# Patient Record
Sex: Male | Born: 1970 | Race: White | Hispanic: No | Marital: Single | Smoking: Current every day smoker
Health system: Southern US, Community
[De-identification: ages and names within clinical notes are randomized; demographics above are authoritative.]

## PROBLEM LIST (undated history)

## (undated) DIAGNOSIS — F209 Schizophrenia, unspecified: Secondary | ICD-10-CM

## (undated) DIAGNOSIS — E119 Type 2 diabetes mellitus without complications: Secondary | ICD-10-CM

## (undated) DIAGNOSIS — Z978 Presence of other specified devices: Secondary | ICD-10-CM

## (undated) DIAGNOSIS — J45909 Unspecified asthma, uncomplicated: Secondary | ICD-10-CM

## (undated) DIAGNOSIS — F32A Depression, unspecified: Secondary | ICD-10-CM

## (undated) HISTORY — PX: HERNIA REPAIR: SHX51

---

## 2011-08-27 ENCOUNTER — Emergency Department (INDEPENDENT_AMBULATORY_CARE_PROVIDER_SITE_OTHER): Payer: Self-pay

## 2011-08-27 ENCOUNTER — Encounter: Payer: Self-pay | Admitting: *Deleted

## 2011-08-27 ENCOUNTER — Emergency Department (HOSPITAL_BASED_OUTPATIENT_CLINIC_OR_DEPARTMENT_OTHER)
Admission: EM | Admit: 2011-08-27 | Discharge: 2011-08-27 | Disposition: A | Discharge status: Home / Self Care | Payer: Self-pay | Attending: Emergency Medicine | Admitting: Emergency Medicine

## 2011-08-27 ENCOUNTER — Other Ambulatory Visit: Payer: Self-pay

## 2011-08-27 DIAGNOSIS — R0609 Other forms of dyspnea: Secondary | ICD-10-CM

## 2011-08-27 DIAGNOSIS — R942 Abnormal results of pulmonary function studies: Secondary | ICD-10-CM

## 2011-08-27 DIAGNOSIS — R0602 Shortness of breath: Secondary | ICD-10-CM | POA: Insufficient documentation

## 2011-08-27 DIAGNOSIS — J40 Bronchitis, not specified as acute or chronic: Secondary | ICD-10-CM | POA: Insufficient documentation

## 2011-08-27 DIAGNOSIS — F17200 Nicotine dependence, unspecified, uncomplicated: Secondary | ICD-10-CM

## 2011-08-27 DIAGNOSIS — R05 Cough: Secondary | ICD-10-CM

## 2011-08-27 HISTORY — DX: Schizophrenia, unspecified: F20.9

## 2011-08-27 MED ORDER — ALBUTEROL SULFATE HFA 108 (90 BASE) MCG/ACT IN AERS
2.0000 | INHALATION_SPRAY | RESPIRATORY_TRACT | Status: DC | PRN
  Administered 2011-08-27: 2 via RESPIRATORY_TRACT
  Filled 2011-08-27: qty 6.7

## 2011-08-27 NOTE — ED Notes (Signed)
Pt is visibly anxious during exam. Restless with extremities. States he has sensation of "not getting in enough air" while these attacks occur. States it has been occuring after smoking cigarettes last night and again today. Denies hx of anxiety. Denies change in meds, but states he was out of his prolixin for a few days, and restarted it yesterday, which is not uncommon for him to run out and be off for a few days per pt. Denies any recent stressors or cause of anxiety. Denies SI/HI. Pt is cooperative during exam.

## 2011-08-27 NOTE — ED Notes (Signed)
After smoking a cigarette last night got sob. Was better when he woke this am. Same thing after smoking 6 cigarettes today. Mom thinks he is having panic attacks. Ran out of Prolixin a week ago.

## 2011-08-27 NOTE — ED Provider Notes (Signed)
History     CSN: [id number] Arrival date & time: 08/27/2011  8:38 PM  Chief Complaint  Patient presents with  . URI    (Consider location/radiation/quality/duration/timing/severity/associated sxs/prior treatment) Patient is a 40 y.o. male presenting with shortness of breath. The history is provided by the patient.  Shortness of Breath  The current episode started yesterday. Associated symptoms include shortness of breath. Pertinent negatives include no chest pain, no fever, no rhinorrhea, no sore throat and no wheezing. There was no intake of a foreign body. He was not exposed to toxic fumes.  patient has had 2 episodes of shortness of breath. Both while smoking. He then began to feel anxious about not breathing. No cough. No chest pain. No palpitations. No recent travel. No leg swelling. He is a smoker.   Past Medical History  Diagnosis Date  . Schizophrenia     History reviewed. No pertinent past surgical history.  No family history on file.  History  Substance Use Topics  . Smoking status: Current Everyday Smoker -- 1.0 packs/day  . Smokeless tobacco: Not on file  . Alcohol Use: No      Review of Systems  Constitutional: Negative for fever and chills.  HENT: Negative for sore throat and rhinorrhea.   Respiratory: Positive for shortness of breath. Negative for wheezing.   Cardiovascular: Negative for chest pain.  Psychiatric/Behavioral: The patient is not nervous/anxious.     Allergies  Review of patient's allergies indicates no known allergies.  Home Medications   Current Outpatient Rx  Name Route Sig Dispense Refill  . BENZTROPINE MESYLATE 1 MG PO TABS Oral Take 1 mg by mouth daily.      Marland Kitchen CALCIUM CARBONATE ANTACID 500 MG PO CHEW Oral Chew 1 tablet by mouth 2 (two) times daily as needed. For upset stomach      . FLUOXETINE HCL 40 MG PO CAPS Oral Take 40 mg by mouth daily.      Marland Kitchen FLUPHENAZINE HCL 10 MG PO TABS Oral Take 10 mg by mouth daily.        BP 129/86   Pulse 90  Temp(Src) 98.1 F (36.7 C) (Oral)  Resp 18  SpO2 100%  Physical Exam  Nursing note and vitals reviewed. Constitutional: He is oriented to person, place, and time. He appears well-developed and well-nourished.  HENT:  Head: Normocephalic and atraumatic.  Eyes: EOM are normal. Pupils are equal, round, and reactive to light.  Neck: Normal range of motion. Neck supple.  Cardiovascular: Normal rate, regular rhythm and normal heart sounds.   No murmur heard. Pulmonary/Chest: Effort normal and breath sounds normal.  Abdominal: Soft. Bowel sounds are normal. He exhibits no distension and no mass. There is no tenderness. There is no rebound and no guarding.  Musculoskeletal: Normal range of motion. He exhibits no edema.  Neurological: He is alert and oriented to person, place, and time. No cranial nerve deficit.  Skin: Skin is warm and dry.  Psychiatric: He has a normal mood and affect.    ED Course  Procedures (including critical care time)  Labs Reviewed - No data to display Dg Chest 2 View  08/27/2011  *RADIOLOGY REPORT*  Clinical Data: Smoker with cough and labored breathing.  CHEST - 2 VIEW 08/27/2011:  Comparison: None.  Findings: Cardiomediastinal silhouette unremarkable.  Mildly prominent bronchovascular markings diffusely and mild central peribronchial thickening.  Lungs otherwise clear.  No pleural effusions.  Visualized bony thorax intact.  IMPRESSION: Mild changes of bronchitis and/or asthma.  No  acute cardiopulmonary disease otherwise.  Original Report Authenticated By: Arnell Sieving, M.D.     1. Bronchitis     Date: 08/27/2011  Rate: 97  Rhythm: normal sinus rhythm  QRS Axis: right  Intervals: normal  ST/T Wave abnormalities: normal  Conduction Disutrbances:none  Narrative Interpretation:   Old EKG Reviewed: none available      MDM  Shortness of breath. Acute onset. Improved. Xray showed bronchitis. EKG reassuring. Discharged with inhaler.          Juliet Rude. Rubin Payor, MD 08/27/11 2216

## 2011-08-27 NOTE — ED Notes (Signed)
Pt given albuterol inh TO GO and instructed on usage. Pt demonstrated correct technique

## 2012-12-31 IMAGING — CR DG CHEST 2V
2 series · 2 of 2 positions shown · non-contrast
Comparison: None.

CLINICAL DATA: Smoker with cough and labored breathing.

CHEST - 2 VIEW 08/27/2011:

[w chest pa]
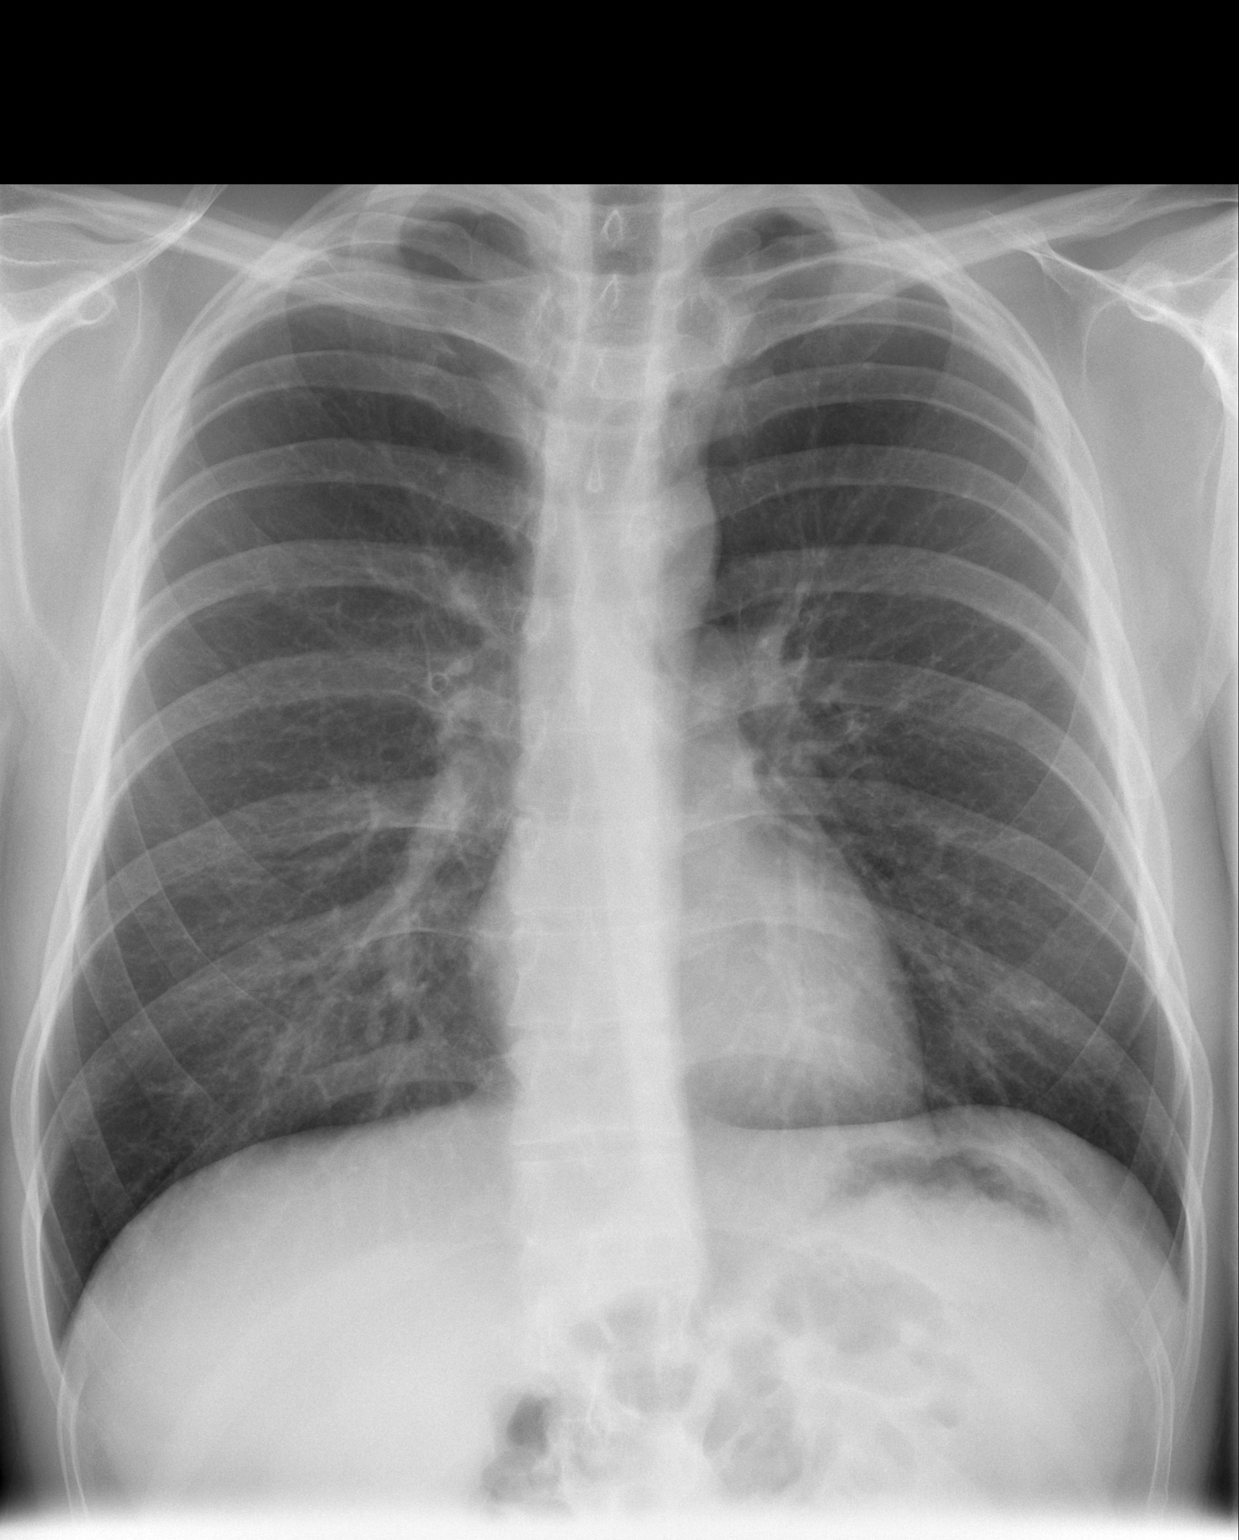

[w chest lat]
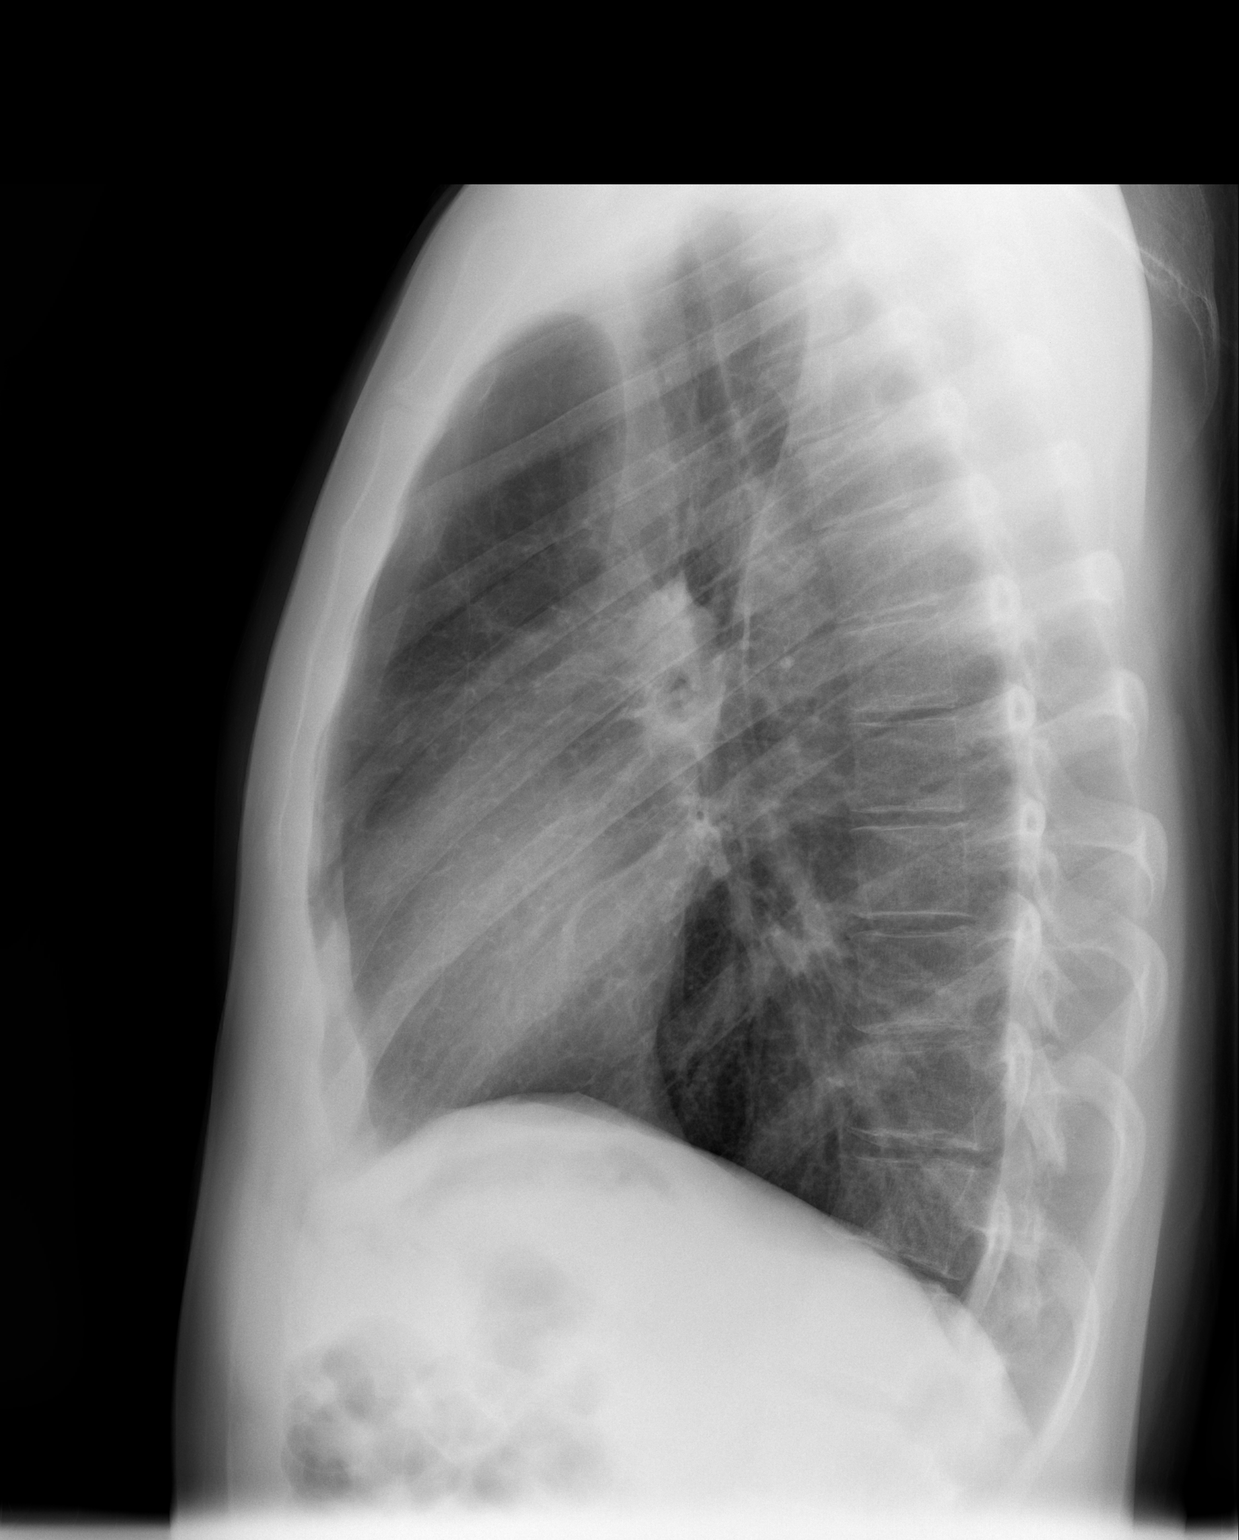

[2 of 2 positions shown; findings below may reference images not displayed]

FINDINGS: Cardiomediastinal silhouette unremarkable.  Mildly
prominent bronchovascular markings diffusely and mild central
peribronchial thickening.  Lungs otherwise clear.  No pleural
effusions.  Visualized bony thorax intact.
IMPRESSION: Mild changes of bronchitis and/or asthma.  No acute cardiopulmonary
disease otherwise.

## 2015-02-04 ENCOUNTER — Emergency Department
Admission: EM | Admit: 2015-02-04 | Discharge: 2015-02-04 | Disposition: A | Discharge status: Home / Self Care | Payer: Medicaid Other | Source: Home / Self Care | Attending: Family Medicine | Admitting: Family Medicine

## 2015-02-04 ENCOUNTER — Encounter: Payer: Self-pay | Admitting: Emergency Medicine

## 2015-02-04 DIAGNOSIS — S238XXA Sprain of other specified parts of thorax, initial encounter: Secondary | ICD-10-CM

## 2015-02-04 MED ORDER — MELOXICAM 15 MG PO TABS: ORAL_TABLET | ORAL | Status: DC

## 2015-02-04 MED ORDER — METAXALONE 800 MG PO TABS: 800.0000 mg | ORAL_TABLET | Freq: Three times a day (TID) | ORAL | Status: DC

## 2015-02-04 NOTE — ED Notes (Signed)
Rt shoulder pain, woke up 3 days ago with pain in shoulder, upper back, right neck, denies trauma.

## 2015-02-04 NOTE — Discharge Instructions (Signed)
Apply ice pack for 20 to 30 minutes, 3 to 4 times daily  Continue until pain decreases.  Begin range of motion and stretching exercises.   Cryotherapy Cryotherapy means treatment with cold. Ice or gel packs can be used to reduce both pain and swelling. Ice is the most helpful within the first 24 to 48 hours after an injury or flare-up from overusing a muscle or joint. Sprains, strains, spasms, burning pain, shooting pain, and aches can all be eased with ice. Ice can also be used when recovering from surgery. Ice is effective, has very few side effects, and is safe for most people to use. PRECAUTIONS  Ice is not a safe treatment option for people with:  Raynaud phenomenon. This is a condition affecting small blood vessels in the extremities. Exposure to cold may cause your problems to return.  Cold hypersensitivity. There are many forms of cold hypersensitivity, including:  Cold urticaria. Red, itchy hives appear on the skin when the tissues begin to warm after being iced.  Cold erythema. This is a red, itchy rash caused by exposure to cold.  Cold hemoglobinuria. Red blood cells break down when the tissues begin to warm after being iced. The hemoglobin that carry oxygen are passed into the urine because they cannot combine with blood proteins fast enough.  Numbness or altered sensitivity in the area being iced. If you have any of the following conditions, do not use ice until you have discussed cryotherapy with your caregiver:  Heart conditions, such as arrhythmia, angina, or chronic heart disease.  High blood pressure.  Healing wounds or open skin in the area being iced.  Current infections.  Rheumatoid arthritis.  Poor circulation.  Diabetes. Ice slows the blood flow in the region it is applied. This is beneficial when trying to stop inflamed tissues from spreading irritating chemicals to surrounding tissues. However, if you expose your skin to cold temperatures for too long or  without the proper protection, you can damage your skin or nerves. Watch for signs of skin damage due to cold. HOME CARE INSTRUCTIONS Follow these tips to use ice and cold packs safely.  Place a dry or damp towel between the ice and skin. A damp towel will cool the skin more quickly, so you may need to shorten the time that the ice is used.  For a more rapid response, add gentle compression to the ice.  Ice for no more than 10 to 20 minutes at a time. The bonier the area you are icing, the less time it will take to get the benefits of ice.  Check your skin after 5 minutes to make sure there are no signs of a poor response to cold or skin damage.  Rest 20 minutes or more between uses.  Once your skin is numb, you can end your treatment. You can test numbness by very lightly touching your skin. The touch should be so light that you do not see the skin dimple from the pressure of your fingertip. When using ice, most people will feel these normal sensations in this order: cold, burning, aching, and numbness.  Do not use ice on someone who cannot communicate their responses to pain, such as small children or people with dementia. HOW TO MAKE AN ICE PACK Ice packs are the most common way to use ice therapy. Other methods include ice massage, ice baths, and cryosprays. Muscle creams that cause a cold, tingly feeling do not offer the same benefits that ice offers and  should not be used as a substitute unless recommended by your caregiver. To make an ice pack, do one of the following:  Place crushed ice or a bag of frozen vegetables in a sealable plastic bag. Squeeze out the excess air. Place this bag inside another plastic bag. Slide the bag into a pillowcase or place a damp towel between your skin and the bag.  Mix 3 parts water with 1 part rubbing alcohol. Freeze the mixture in a sealable plastic bag. When you remove the mixture from the freezer, it will be slushy. Squeeze out the excess air. Place  this bag inside another plastic bag. Slide the bag into a pillowcase or place a damp towel between your skin and the bag. SEEK MEDICAL CARE IF:  You develop white spots on your skin. This may give the skin a blotchy (mottled) appearance.  Your skin turns blue or pale.  Your skin becomes waxy or hard.  Your swelling gets worse. MAKE SURE YOU:   Understand these instructions.  Will watch your condition.  Will get help right away if you are not doing well or get worse. Document Released: 07/13/2011 Document Revised: 04/02/2014 Document Reviewed: 07/13/2011 [facility] Patient Information 2015 [place], [place]. This information is not intended to replace advice given to you by your health care provider. Make sure you discuss any questions you have with your health care provider.

## 2015-02-04 NOTE — ED Provider Notes (Signed)
CSN: [id number]     Arrival date & time 02/04/15  9357 History   First MD Initiated Contact with Patient 02/04/15 1008     Chief Complaint  Patient presents with  . Shoulder Pain      HPI Comments: Patient slept on his right arm 3 days ago and awoke with pain in his right shoulder blade area that radiates to his right shoulder.  He recalls no injury otherwise.  The pain has persisted and is worse with movement of his shoulder.  No cough or pleuritic pain.  He feels well otherwise.  Patient is a 44 y.o. male presenting with shoulder pain. The history is provided by the patient.  Shoulder Pain Location:  Shoulder Time since incident:  3 days Injury: no   Shoulder location:  R shoulder Pain details:    Quality:  Aching   Severity:  Mild   Onset quality:  Sudden   Duration:  3 days   Timing:  Constant   Progression:  Unchanged Chronicity:  New Handedness:  Right-handed Prior injury to area:  No Relieved by:  Nothing Worsened by:  Movement Ineffective treatments:  NSAIDs Associated symptoms: no back pain, no decreased range of motion, no fatigue, no fever, no muscle weakness, no neck pain, no numbness, no stiffness, no swelling and no tingling     Past Medical History  Diagnosis Date  . Schizophrenia    History reviewed. No pertinent past surgical history. No family history on file. History  Substance Use Topics  . Smoking status: Current Every Day Smoker -- 1.00 packs/day for 25 years  . Smokeless tobacco: Not on file  . Alcohol Use: No    Review of Systems  Constitutional: Negative for fever and fatigue.  Musculoskeletal: Negative for back pain, stiffness and neck pain.  All other systems reviewed and are negative.   Allergies  Review of patient's allergies indicates not on file.  Home Medications   Prior to Admission medications   Medication Sig Start Date End Date Taking? Authorizing Provider  benztropine (COGENTIN) 1 MG tablet Take 1 mg by mouth daily.       Historical Provider, MD  calcium carbonate (TUMS - DOSED IN MG ELEMENTAL CALCIUM) 500 MG chewable tablet Chew 1 tablet by mouth 2 (two) times daily as needed. For upset stomach      Historical Provider, MD  FLUoxetine (PROZAC) 40 MG capsule Take 40 mg by mouth daily.      Historical Provider, MD  fluPHENAZine (PROLIXIN) 10 MG tablet Take 10 mg by mouth daily.      Historical Provider, MD  meloxicam (MOBIC) 15 MG tablet Take with food each evening 02/04/15   Kandra Nicolas, MD  metaxalone (SKELAXIN) 800 MG tablet Take 1 tablet (800 mg total) by mouth 3 (three) times daily. 02/04/15   Kandra Nicolas, MD   BP 112/78 mmHg  Pulse 71  Temp(Src) 98.6 F (37 C) (Oral)  Ht 5' 9"  (1.753 m)  Wt 163 lb (73.936 kg)  BMI 24.06 kg/m2  SpO2 100% Physical Exam  Constitutional: He is oriented to person, place, and time. He appears well-developed and well-nourished. No distress.  HENT:  Head: Normocephalic.  Eyes: Pupils are equal, round, and reactive to light.  Neck: Normal range of motion.  Cardiovascular: Normal heart sounds.   Pulmonary/Chest: Breath sounds normal.    There is distinct tenderness over medial and inferior edges of right scapula.  Pain elicited by resisted abduction of right shoulder while palpating  right rhomboid muscles.   Right shoulder has full range of motion without tenderness to palpation.  Neurological: He is alert and oriented to person, place, and time.  Skin: Skin is warm and dry. No rash noted.  Nursing note and vitals reviewed.   ED Course  Procedures  none     MDM   1. Sprain of rhomboid, initial encounter    Begin Mobic 85m once daily and Skelaxin 8037mTID Apply ice pack for 20 to 30 minutes, 3 to 4 times daily  Continue until pain decreases.  Begin range of motion and stretching exercises. [facility] about two weeks.     StKandra NicolasMD 02/04/15 1041

## 2015-12-22 DIAGNOSIS — R5382 Chronic fatigue, unspecified: Secondary | ICD-10-CM | POA: Insufficient documentation

## 2015-12-22 DIAGNOSIS — F329 Major depressive disorder, single episode, unspecified: Secondary | ICD-10-CM | POA: Diagnosis present

## 2015-12-22 DIAGNOSIS — G2401 Drug induced subacute dyskinesia: Secondary | ICD-10-CM | POA: Diagnosis present

## 2015-12-22 DIAGNOSIS — F17210 Nicotine dependence, cigarettes, uncomplicated: Secondary | ICD-10-CM | POA: Diagnosis present

## 2015-12-22 DIAGNOSIS — G250 Essential tremor: Secondary | ICD-10-CM | POA: Insufficient documentation

## 2015-12-24 DIAGNOSIS — R1013 Epigastric pain: Secondary | ICD-10-CM | POA: Insufficient documentation

## 2016-10-14 DIAGNOSIS — R45851 Suicidal ideations: Secondary | ICD-10-CM | POA: Insufficient documentation

## 2016-10-14 DIAGNOSIS — F1010 Alcohol abuse, uncomplicated: Secondary | ICD-10-CM | POA: Diagnosis present

## 2016-10-14 DIAGNOSIS — F1210 Cannabis abuse, uncomplicated: Secondary | ICD-10-CM | POA: Diagnosis present

## 2016-10-14 DIAGNOSIS — F1910 Other psychoactive substance abuse, uncomplicated: Secondary | ICD-10-CM | POA: Diagnosis present

## 2016-10-14 DIAGNOSIS — F1220 Cannabis dependence, uncomplicated: Secondary | ICD-10-CM | POA: Diagnosis present

## 2020-10-03 ENCOUNTER — Emergency Department (HOSPITAL_BASED_OUTPATIENT_CLINIC_OR_DEPARTMENT_OTHER): Payer: Medicaid Other

## 2020-10-03 ENCOUNTER — Other Ambulatory Visit: Payer: Self-pay

## 2020-10-03 ENCOUNTER — Inpatient Hospital Stay (HOSPITAL_BASED_OUTPATIENT_CLINIC_OR_DEPARTMENT_OTHER)
Admission: EM | Admit: 2020-10-03 | Discharge: 2020-10-07 | DRG: 637 | Disposition: A | Discharge status: Home / Self Care | Payer: Medicaid Other | Attending: Internal Medicine | Admitting: Internal Medicine

## 2020-10-03 ENCOUNTER — Encounter (HOSPITAL_BASED_OUTPATIENT_CLINIC_OR_DEPARTMENT_OTHER): Payer: Self-pay

## 2020-10-03 DIAGNOSIS — E8729 Other acidosis: Secondary | ICD-10-CM

## 2020-10-03 DIAGNOSIS — E871 Hypo-osmolality and hyponatremia: Secondary | ICD-10-CM

## 2020-10-03 DIAGNOSIS — Z20822 Contact with and (suspected) exposure to covid-19: Secondary | ICD-10-CM | POA: Diagnosis present

## 2020-10-03 DIAGNOSIS — J9601 Acute respiratory failure with hypoxia: Secondary | ICD-10-CM | POA: Diagnosis present

## 2020-10-03 DIAGNOSIS — E1111 Type 2 diabetes mellitus with ketoacidosis with coma: Principal | ICD-10-CM

## 2020-10-03 DIAGNOSIS — G9341 Metabolic encephalopathy: Secondary | ICD-10-CM | POA: Diagnosis present

## 2020-10-03 DIAGNOSIS — E440 Moderate protein-calorie malnutrition: Secondary | ICD-10-CM | POA: Diagnosis present

## 2020-10-03 DIAGNOSIS — Z9289 Personal history of other medical treatment: Secondary | ICD-10-CM

## 2020-10-03 DIAGNOSIS — E872 Acidosis, unspecified: Secondary | ICD-10-CM

## 2020-10-03 DIAGNOSIS — Z681 Body mass index (BMI) 19 or less, adult: Secondary | ICD-10-CM

## 2020-10-03 DIAGNOSIS — Z79899 Other long term (current) drug therapy: Secondary | ICD-10-CM

## 2020-10-03 DIAGNOSIS — F209 Schizophrenia, unspecified: Secondary | ICD-10-CM

## 2020-10-03 DIAGNOSIS — T68XXXA Hypothermia, initial encounter: Secondary | ICD-10-CM

## 2020-10-03 DIAGNOSIS — R64 Cachexia: Secondary | ICD-10-CM | POA: Diagnosis present

## 2020-10-03 DIAGNOSIS — E10649 Type 1 diabetes mellitus with hypoglycemia without coma: Secondary | ICD-10-CM | POA: Diagnosis not present

## 2020-10-03 DIAGNOSIS — K922 Gastrointestinal hemorrhage, unspecified: Secondary | ICD-10-CM

## 2020-10-03 DIAGNOSIS — K219 Gastro-esophageal reflux disease without esophagitis: Secondary | ICD-10-CM | POA: Diagnosis present

## 2020-10-03 DIAGNOSIS — J9602 Acute respiratory failure with hypercapnia: Secondary | ICD-10-CM | POA: Diagnosis present

## 2020-10-03 DIAGNOSIS — E876 Hypokalemia: Secondary | ICD-10-CM

## 2020-10-03 DIAGNOSIS — I959 Hypotension, unspecified: Secondary | ICD-10-CM | POA: Diagnosis present

## 2020-10-03 DIAGNOSIS — Z791 Long term (current) use of non-steroidal anti-inflammatories (NSAID): Secondary | ICD-10-CM

## 2020-10-03 DIAGNOSIS — R680 Hypothermia, not associated with low environmental temperature: Secondary | ICD-10-CM | POA: Diagnosis present

## 2020-10-03 DIAGNOSIS — Z888 Allergy status to other drugs, medicaments and biological substances status: Secondary | ICD-10-CM

## 2020-10-03 DIAGNOSIS — E1110 Type 2 diabetes mellitus with ketoacidosis without coma: Secondary | ICD-10-CM | POA: Diagnosis present

## 2020-10-03 DIAGNOSIS — N179 Acute kidney failure, unspecified: Secondary | ICD-10-CM

## 2020-10-03 DIAGNOSIS — F4000 Agoraphobia, unspecified: Secondary | ICD-10-CM | POA: Diagnosis present

## 2020-10-03 DIAGNOSIS — E1311 Other specified diabetes mellitus with ketoacidosis with coma: Secondary | ICD-10-CM

## 2020-10-03 DIAGNOSIS — E873 Alkalosis: Secondary | ICD-10-CM | POA: Diagnosis present

## 2020-10-03 DIAGNOSIS — K2970 Gastritis, unspecified, without bleeding: Secondary | ICD-10-CM | POA: Diagnosis present

## 2020-10-03 DIAGNOSIS — E109 Type 1 diabetes mellitus without complications: Secondary | ICD-10-CM

## 2020-10-03 DIAGNOSIS — F17200 Nicotine dependence, unspecified, uncomplicated: Secondary | ICD-10-CM | POA: Diagnosis present

## 2020-10-03 DIAGNOSIS — R000 Tachycardia, unspecified: Secondary | ICD-10-CM

## 2020-10-03 LAB — I-STAT ARTERIAL BLOOD GAS, ED
Acid-base deficit: 22 mmol/L — ABNORMAL HIGH (ref 0.0–2.0)
Bicarbonate: 7.5 mmol/L — ABNORMAL LOW (ref 20.0–28.0)
Calcium, Ion: 1.15 mmol/L (ref 1.15–1.40)
HCT: 37 % — ABNORMAL LOW (ref 39.0–52.0)
Hemoglobin: 12.6 g/dL — ABNORMAL LOW (ref 13.0–17.0)
O2 Saturation: 100 %
Patient temperature: 92
Potassium: 2.5 mmol/L — CL (ref 3.5–5.1)
Sodium: 126 mmol/L — ABNORMAL LOW (ref 135–145)
TCO2: 8 mmol/L — ABNORMAL LOW (ref 22–32)
pCO2 arterial: 23 mmHg — ABNORMAL LOW (ref 32.0–48.0)
pH, Arterial: 7.095 — CL (ref 7.350–7.450)
pO2, Arterial: 557 mmHg — ABNORMAL HIGH (ref 83.0–108.0)

## 2020-10-03 LAB — CBC WITH DIFFERENTIAL/PLATELET
Abs Immature Granulocytes: 0.41 10*3/uL — ABNORMAL HIGH (ref 0.00–0.07)
Basophils Absolute: 0.1 10*3/uL (ref 0.0–0.1)
Basophils Relative: 0 %
Eosinophils Absolute: 0 10*3/uL (ref 0.0–0.5)
Eosinophils Relative: 0 %
HCT: 41.7 % (ref 39.0–52.0)
Hemoglobin: 14.6 g/dL (ref 13.0–17.0)
Immature Granulocytes: 1 %
Lymphocytes Relative: 6 %
Lymphs Abs: 1.9 10*3/uL (ref 0.7–4.0)
MCH: 31.8 pg (ref 26.0–34.0)
MCHC: 35 g/dL (ref 30.0–36.0)
MCV: 90.8 fL (ref 80.0–100.0)
Monocytes Absolute: 2 10*3/uL — ABNORMAL HIGH (ref 0.1–1.0)
Monocytes Relative: 7 %
Neutro Abs: 26.2 10*3/uL — ABNORMAL HIGH (ref 1.7–7.7)
Neutrophils Relative %: 86 %
Platelets: 345 10*3/uL (ref 150–400)
RBC: 4.59 MIL/uL (ref 4.22–5.81)
RDW: 11.2 % — ABNORMAL LOW (ref 11.5–15.5)
Smear Review: NORMAL
WBC: 30.6 10*3/uL — ABNORMAL HIGH (ref 4.0–10.5)
nRBC: 0 % (ref 0.0–0.2)

## 2020-10-03 LAB — COMPREHENSIVE METABOLIC PANEL
ALT: 22 U/L (ref 0–44)
AST: 25 U/L (ref 15–41)
Albumin: 4.8 g/dL (ref 3.5–5.0)
Alkaline Phosphatase: 104 U/L (ref 38–126)
Anion gap: 34 — ABNORMAL HIGH (ref 5–15)
BUN: 37 mg/dL — ABNORMAL HIGH (ref 6–20)
CO2: <7 mmol/L — ABNORMAL LOW (ref 22–32)
Calcium: 9.7 mg/dL (ref 8.9–10.3)
Chloride: 77 mmol/L — ABNORMAL LOW (ref 98–111)
Creatinine, Ser: 2.1 mg/dL — ABNORMAL HIGH (ref 0.61–1.24)
GFR, Estimated: 38 mL/min — ABNORMAL LOW (ref >60)
Glucose, Bld: 1062 mg/dL (ref 70–99)
Potassium: 3.9 mmol/L (ref 3.5–5.1)
Sodium: 116 mmol/L — CL (ref 135–145)
Total Bilirubin: 1.3 mg/dL — ABNORMAL HIGH (ref 0.3–1.2)
Total Protein: 7.6 g/dL (ref 6.5–8.1)

## 2020-10-03 LAB — RESPIRATORY PANEL BY RT PCR (FLU A&B, COVID)
Influenza A by PCR: NEGATIVE
Influenza B by PCR: NEGATIVE
SARS Coronavirus 2 by RT PCR: NEGATIVE

## 2020-10-03 LAB — I-STAT VENOUS BLOOD GAS, ED
Acid-base deficit: 25 mmol/L — ABNORMAL HIGH (ref 0.0–2.0)
Bicarbonate: 6.1 mmol/L — ABNORMAL LOW (ref 20.0–28.0)
Calcium, Ion: 1.23 mmol/L (ref 1.15–1.40)
HCT: 48 % (ref 39.0–52.0)
Hemoglobin: 16.3 g/dL (ref 13.0–17.0)
O2 Saturation: 49 %
Patient temperature: 94
Potassium: 3.7 mmol/L (ref 3.5–5.1)
Sodium: 114 mmol/L — CL (ref 135–145)
TCO2: 7 mmol/L — ABNORMAL LOW (ref 22–32)
pCO2, Ven: 24.7 mmHg — ABNORMAL LOW (ref 44.0–60.0)
pH, Ven: 6.981 — CL (ref 7.250–7.430)
pO2, Ven: 34 mmHg (ref 32.0–45.0)

## 2020-10-03 LAB — LIPASE, BLOOD: Lipase: 29 U/L (ref 11–51)

## 2020-10-03 LAB — CBG MONITORING, ED
Glucose-Capillary: >600 mg/dL (ref 70–99)
Glucose-Capillary: >600 mg/dL (ref 70–99)

## 2020-10-03 LAB — LACTIC ACID, PLASMA: Lactic Acid, Venous: 5.8 mmol/L (ref 0.5–1.9)

## 2020-10-03 LAB — TROPONIN I (HIGH SENSITIVITY): Troponin I (High Sensitivity): 116 ng/L (ref <18)

## 2020-10-03 MED ORDER — VANCOMYCIN HCL IN DEXTROSE 1-5 GM/200ML-% IV SOLN
1000.0000 mg | Freq: Once | INTRAVENOUS | Status: AC
  Administered 2020-10-03: 1000 mg via INTRAVENOUS
  Filled 2020-10-03: qty 200

## 2020-10-03 MED ORDER — PIPERACILLIN-TAZOBACTAM 3.375 G IVPB 30 MIN
3.3750 g | Freq: Once | INTRAVENOUS | Status: AC
  Administered 2020-10-03: 3.375 g via INTRAVENOUS
  Filled 2020-10-03: qty 50

## 2020-10-03 MED ORDER — INSULIN REGULAR HUMAN 100 UNIT/ML IJ SOLN
10.0000 [IU] | Freq: Once | INTRAMUSCULAR | Status: DC
  Filled 2020-10-03: qty 3

## 2020-10-03 MED ORDER — DEXTROSE 50 % IV SOLN: 0.0000 mL | INTRAVENOUS | Status: DC | PRN

## 2020-10-03 MED ORDER — SODIUM CHLORIDE 0.9% IV SOLUTION
Freq: Once | INTRAVENOUS | Status: DC
  Filled 2020-10-03: qty 250

## 2020-10-03 MED ORDER — DEXTROSE IN LACTATED RINGERS 5 % IV SOLN: INTRAVENOUS | Status: DC

## 2020-10-03 MED ORDER — LACTATED RINGERS IV BOLUS: 20.0000 mL/kg | Freq: Once | INTRAVENOUS | Status: DC

## 2020-10-03 MED ORDER — INSULIN REGULAR(HUMAN) IN NACL 100-0.9 UT/100ML-% IV SOLN
INTRAVENOUS | Status: AC
  Filled 2020-10-03: qty 100

## 2020-10-03 MED ORDER — ETOMIDATE 2 MG/ML IV SOLN
INTRAVENOUS | Status: AC | PRN
  Administered 2020-10-03: 20 mg via INTRAVENOUS

## 2020-10-03 MED ORDER — NOREPINEPHRINE 4 MG/250ML-% IV SOLN
0.0000 ug/min | INTRAVENOUS | Status: DC
  Filled 2020-10-03: qty 250

## 2020-10-03 MED ORDER — ROCURONIUM BROMIDE 50 MG/5ML IV SOLN
INTRAVENOUS | Status: AC | PRN
  Administered 2020-10-03: 100 mg via INTRAVENOUS

## 2020-10-03 MED ORDER — FENTANYL CITRATE (PF) 100 MCG/2ML IJ SOLN
INTRAMUSCULAR | Status: AC | PRN
Start: 2020-10-03 — End: 2020-10-03
  Administered 2020-10-03: 100 ug via INTRAVENOUS

## 2020-10-03 MED ORDER — PROPOFOL 1000 MG/100ML IV EMUL
INTRAVENOUS | Status: AC
  Filled 2020-10-03: qty 100

## 2020-10-03 MED ORDER — SODIUM BICARBONATE 8.4 % IV SOLN
INTRAVENOUS | Status: AC
  Filled 2020-10-03: qty 100

## 2020-10-03 MED ORDER — POTASSIUM CHLORIDE 10 MEQ/100ML IV SOLN
10.0000 meq | INTRAVENOUS | Status: DC
  Filled 2020-10-03: qty 100

## 2020-10-03 MED ORDER — MIDAZOLAM HCL 5 MG/5ML IJ SOLN
INTRAMUSCULAR | Status: AC | PRN
  Administered 2020-10-03 – 2020-10-04 (×2): 2 mg via INTRAVENOUS

## 2020-10-03 MED ORDER — SODIUM CHLORIDE 0.9 % IV BOLUS
1000.0000 mL | Freq: Once | INTRAVENOUS | Status: AC
  Administered 2020-10-03: 1000 mL via INTRAVENOUS

## 2020-10-03 MED ORDER — LACTATED RINGERS IV SOLN: INTRAVENOUS | Status: DC

## 2020-10-03 MED ORDER — SODIUM CHLORIDE 0.9 % IV SOLN
INTRAVENOUS | Status: AC | PRN
  Administered 2020-10-03: 1000 mL via INTRAVENOUS

## 2020-10-03 MED ORDER — DEXMEDETOMIDINE HCL IN NACL 200 MCG/50ML IV SOLN: 0.4000 ug/kg/h | INTRAVENOUS | Status: DC

## 2020-10-03 MED ORDER — INSULIN REGULAR(HUMAN) IN NACL 100-0.9 UT/100ML-% IV SOLN
INTRAVENOUS | Status: DC
  Administered 2020-10-03: 11 [IU]/h via INTRAVENOUS

## 2020-10-03 MED ORDER — PROPOFOL 1000 MG/100ML IV EMUL
5.0000 ug/kg/min | INTRAVENOUS | Status: DC
  Administered 2020-10-03: 10 ug/kg/min via INTRAVENOUS

## 2020-10-03 MED ORDER — SODIUM BICARBONATE 8.4 % IV SOLN
INTRAVENOUS | Status: AC | PRN
  Administered 2020-10-03 (×2): 50 meq via INTRAVENOUS

## 2020-10-03 MED ORDER — MIDAZOLAM HCL 5 MG/5ML IJ SOLN
INTRAMUSCULAR | Status: AC
  Filled 2020-10-03: qty 5

## 2020-10-03 MED ORDER — FENTANYL CITRATE (PF) 100 MCG/2ML IJ SOLN
INTRAMUSCULAR | Status: AC
  Filled 2020-10-03: qty 2

## 2020-10-03 MED ORDER — INSULIN ASPART 100 UNIT/ML ~~LOC~~ SOLN
SUBCUTANEOUS | Status: AC
  Administered 2020-10-03: 10 [IU]
  Filled 2020-10-03: qty 10

## 2020-10-03 NOTE — Code Documentation (Signed)
Pt in active respiratory arrests attempting to intubate now

## 2020-10-03 NOTE — Code Documentation (Signed)
Vital signs stable. 

## 2020-10-03 NOTE — Code Documentation (Addendum)
7.5 airway tube color metric did not change airway remvoed

## 2020-10-03 NOTE — ED Triage Notes (Addendum)
Per mother pt with confusion, decreased appetite, weight loss, abd pain n/v-sx started 3 days ago-mother later states some sx x "months"-pt entered triage with unsteady gait, pale, tachypnea with mouth breathing, constant motion in triage chair-will not remain still for VS-pt speaking in short phrases/words-taken to tx area via w/c

## 2020-10-03 NOTE — Code Documentation (Signed)
Airway inserted 7.5

## 2020-10-03 NOTE — H&P (Signed)
NAME:  Tom Anderson, MRN:  [id number], DOB:  01-01-1971, LOS: 0 ADMISSION DATE:  10/03/2020, CONSULTATION DATE:  10/03/20 REFERRING MD:  Darl Householder, CHIEF COMPLAINT:  confusion   Brief History   49 year old man with hx of poor medical f/u presenting with DKA.  History of present illness   49 year old man presenting with progressive confusion, lethargy, N/V to [facility] ER.  Found to be in extremis and intubated emergently.  Labs consistent with DKA +/- sepsis.   Transferred to [facility] for ongoing care.  Per father over phone has had poor PO for multiple months and N/V, confusion, dyspnea for past couple days,  Past Medical History  Schizophrenia on prolixin and cogentin  Significant Hospital Events   10/03/20 ER/intubated/line>> [facility]  Consults:  n/a  Procedures:  n/a  Significant Diagnostic Tests:  CXR hyperexpanded lungs, ?central line in LIJ not crossing midline Lactate 6 on admission  Micro Data:  COVID neg  Antimicrobials:  10/03/20: vanc/zosyn x 1 in ER 11/4>> ceftriaxone   Interim history/subjective:  consulted  Objective   Blood pressure (!) 80/65, pulse (!) 126, temperature (!) 94.6 F (34.8 C), temperature source Tympanic, resp. rate 12, SpO2 95 %.        Intake/Output Summary (Last 24 hours) at 10/03/2020 2312 Last data filed at 10/03/2020 2305 Gross per 24 hour  Intake 2000 ml  Output --  Net 2000 ml   There were no vitals filed for this visit.  Examination: Chronically ill appearing man on vent MM dry ETT with minimal secretions Heart sounds tachycardic, Ext lukewarm Gags on tube with manipulation otherwise heavily sedated Ext no edema, + muscle wasting  Resolved Hospital Problem list   n/a  Assessment & Plan:  DKA, sometimes associated with atypical antipsychotics but fluphenazine is pretty rare. - Insulin gtt with usual endotool protocols and generous IVF  Hypokalemia related to GI losses- will need to be aggressive with supplementation with concomitant  insulin admistration  Respiratory failure due to inability to compensate for DKA now on ventilator - VAP prevention bundle - SAT/SBT once metabolic disturbances under better control  Acute kidney injury- prerenal, fluids, trend, monitor uop  Schizophrenia- continue cogentin, may need to discuss fluphenazine vs. Another agent with psychiatry in AM  Leukocytosis, SIRS- could all be related to DKA, given vanc/zosym at [facility] - Check UA, blood cx, pct - Ceftriaxone, dc if above studies neg  Question of GIB- given unit of blood at OSH for "bloody OGT output" - PPI, hold ac, watch H/H  Best practice:  Diet: NPO Pain/Anxiety/Delirium protocol (if indicated): fentanyl, propofol VAP protocol (if indicated): in place DVT prophylaxis: SCDs GI prophylaxis: ppi Glucose control: insulin gtt Mobility: BR Code Status: full Family Communication: called and updated father Cecilio Asper at [phone] Disposition: ICU pending vent liberation and correction of electrolyte abnormalities   Medical Decision Making    Diagnoses that are immediately life threatening include DKA, respiratory failure Critical test findings: pH 6.9, glucose > 600, lactate 6 Interventions today to address these diagnoses are intubation, mechanical ventilation, fluid rescuscitation, antibiotics, insulin Likelihood of life-threatening deterioration without intervention is high.  Labs   CBC: Recent Labs  Lab 10/03/20 2214  HGB 16.3  HCT 16.6    Basic Metabolic Panel: Recent Labs  Lab 10/03/20 2209 10/03/20 2214  NA 116* 114*  K 3.9 3.7  CL 77*  --   CO2 <7*  --   GLUCOSE 1,062*  --   BUN 37*  --  CREATININE 2.10*  --   CALCIUM 9.7  --    GFR: CrCl cannot be calculated (Unknown ideal weight.). Recent Labs  Lab 10/03/20 2210  LATICACIDVEN 5.8*    Liver Function Tests: Recent Labs  Lab 10/03/20 2209  AST 25  ALT 22  ALKPHOS 104  BILITOT 1.3*  PROT 7.6  ALBUMIN 4.8   Recent Labs  Lab  10/03/20 2209  LIPASE 29   No results for input(s): AMMONIA in the last 168 hours.  ABG    Component Value Date/Time   HCO3 6.1 (L) 10/03/2020 2214   TCO2 7 (L) 10/03/2020 2214   ACIDBASEDEF 25.0 (H) 10/03/2020 2214   O2SAT 49.0 10/03/2020 2214     Coagulation Profile: No results for input(s): INR, PROTIME in the last 168 hours.  Cardiac Enzymes: No results for input(s): CKTOTAL, CKMB, CKMBINDEX, TROPONINI in the last 168 hours.  HbA1C: No results found for: HGBA1C  CBG: Recent Labs  Lab 10/03/20 2222  GLUCAP >600*    Review of Systems:   Cannot assess, intubated and sedated  Past Medical History  He,  has a past medical history of Schizophrenia ([place]).   Surgical History   History reviewed. No pertinent surgical history.   Social History  Smoker, unclear duration  Family History   His family history is not on file.   Allergies No Known Allergies   Home Medications  Prior to Admission medications   Medication Sig Start Date End Date Taking? Authorizing Provider  benztropine (COGENTIN) 1 MG tablet Take 1 mg by mouth daily.      [provider]  calcium carbonate (TUMS - DOSED IN MG ELEMENTAL CALCIUM) 500 MG chewable tablet Chew 1 tablet by mouth 2 (two) times daily as needed. For upset stomach      [provider]  FLUoxetine (PROZAC) 40 MG capsule Take 40 mg by mouth daily.      [provider]  fluPHENAZine (PROLIXIN) 10 MG tablet Take 10 mg by mouth daily.      [provider]  meloxicam (MOBIC) 15 MG tablet Take with food each evening 02/04/15   Kandra Nicolas, MD  metaxalone (SKELAXIN) 800 MG tablet Take 1 tablet (800 mg total) by mouth 3 (three) times daily. 02/04/15   Kandra Nicolas, MD     Critical care time: 45 minutes

## 2020-10-03 NOTE — ED Notes (Signed)
CURRENTLY REC PROPOFOL IV FOR SEDATION, INFUSING AT 12MCG/KG/MIN (WHITE - PROXIMAL PORT)

## 2020-10-03 NOTE — ED Provider Notes (Signed)
[facility] EMERGENCY DEPARTMENT Provider Note   CSN: [id number] Arrival date & time: 10/03/20  2139     History Chief Complaint  Patient presents with  . Mutiple c/o    Severn Goddard is a 49 y.o. male.  HPI   Mr. Wessells is a 49 y.o. man with PMHx schizophrenia, presenting per his mother due to decreased PO intake. She states he has had weight loss over the past several months. She notes that he has been drinking "a ton" of fluids recently but has not eaten anything at all over the past 4 days. She states he has been vomiting several times per day over the past several days and has been complaining of abdominal pain. She denies any known history of diabetes. She states that at baseline, he is usually alert and able to hold conversation well. She states he has agoraphobia and does not leave the house. She states he uses marijuana occasionally and drinks alcohol every once in a while, but denies heavy use or other drug use. She states that yesterday he starting seeming off to her and was unable to hold conversation, speaking but not making much sense. She states he has never had similar experiences in the past with his schizophrenia. She checked his medication bottles and he continued to take his medications as prescribed.  Past Medical History:  Diagnosis Date  . Schizophrenia [facility])     Patient Active Problem List   Diagnosis Date Noted  . DKA (diabetic ketoacidosis) ([place]) 10/03/2020    History reviewed. No pertinent surgical history.   Patient's maternal grandfather had diabetes. Neither of his parents had diabetes.   Social History   Tobacco Use  . Smoking status: Current Every Day Smoker    Packs/day: 0.00    Years: 0.00    Pack years: 0.00  . Smokeless tobacco: Never Used  Vaping Use  . Vaping Use: Never used  Substance Use Topics  . Alcohol use: Yes    Comment: daily  . Drug use: Yes    Types: Marijuana    Home Medications Prior to Admission  medications   Medication Sig Start Date End Date Taking? Authorizing Provider  benztropine (COGENTIN) 1 MG tablet Take 1 mg by mouth daily.      [provider]  calcium carbonate (TUMS - DOSED IN MG ELEMENTAL CALCIUM) 500 MG chewable tablet Chew 1 tablet by mouth 2 (two) times daily as needed. For upset stomach      [provider]  FLUoxetine (PROZAC) 40 MG capsule Take 40 mg by mouth daily.      [provider]  fluPHENAZine (PROLIXIN) 10 MG tablet Take 10 mg by mouth daily.      [provider]  meloxicam (MOBIC) 15 MG tablet Take with food each evening 02/04/15   Kandra Nicolas, MD  metaxalone (SKELAXIN) 800 MG tablet Take 1 tablet (800 mg total) by mouth 3 (three) times daily. 02/04/15   Kandra Nicolas, MD    Allergies    Patient has no known allergies.  Review of Systems   Review of Systems Limited level 5 caveat as patient is non-verbal  Physical Exam Updated Vital Signs BP (!) 130/98 (BP Location: Right Arm)   Pulse (!) 112   Temp (!) 96.1 F (35.6 C) (Bladder)   Resp (!) 30   Ht 5' 9"  (1.753 m) Comment: 69  SpO2 100%   BMI 24.07 kg/m   Physical Exam   General: Patient appears  acutely ill, restless, in severe acute distress.  Eyes: Scleral icterus present bilaterally. No conjunctival injection. PERRLA.  HENT: Dry mucus membranes. Poor dentition.  Respiratory: Lung sounds difficult to appreciate given patient is extremely restless on examination. He is tachycardic with increased work of breathing and was bagged and intubated due to acute respiratory distress.  Cardiovascular: Rate is tachycardic, rhythm sounds regular. No murmurs, rubs or gallops appreciated. Radial pulses are thready bilaterally. DP and PT pulses are 2+ bilaterally. Extremities are cool to the touch.  Neurological: Patient was initially alert, however, was stringing together nonsensical words. He soon thereafter became disoriented and then unresponsive during  examination.  Musculoskeletal: Patient appears cachectic with diffuse muscular atrophy. No obvious bony deformities.  Abdominal: Abdomen is scaphoid and firm but does not initially appear to be tender to palpation. Bowel sounds intact.  Skin: Patient appears jaundiced and pale with cool extremities. Skin is dry and not diaphoretic.  Psych: Patient initially appeared visibly restless and anxious.   History, ROS, and Physical Examination are limited (level 5 caveat) due to patient unresponsiveness.   ED Results / Procedures / Treatments   Labs (all labs ordered are listed, but only abnormal results are displayed) Labs Reviewed  CBC WITH DIFFERENTIAL/PLATELET - Abnormal; Notable for the following components:      Result Value   WBC 30.6 (*)    RDW 11.2 (*)    Neutro Abs 26.2 (*)    Monocytes Absolute 2.0 (*)    Abs Immature Granulocytes 0.41 (*)    All other components within normal limits  COMPREHENSIVE METABOLIC PANEL - Abnormal; Notable for the following components:   Sodium 116 (*)    Chloride 77 (*)    CO2 <7 (*)    Glucose, Bld 1,062 (*)    BUN 37 (*)    Creatinine, Ser 2.10 (*)    Total Bilirubin 1.3 (*)    GFR, Estimated 38 (*)    Anion gap 34 (*)    All other components within normal limits  LACTIC ACID, PLASMA - Abnormal; Notable for the following components:   Lactic Acid, Venous 5.8 (*)    All other components within normal limits  CBG MONITORING, ED - Abnormal; Notable for the following components:   Glucose-Capillary >600 (*)    All other components within normal limits  I-STAT VENOUS BLOOD GAS, ED - Abnormal; Notable for the following components:   pH, Ven 6.981 (*)    pCO2, Ven 24.7 (*)    Bicarbonate 6.1 (*)    TCO2 7 (*)    Acid-base deficit 25.0 (*)    Sodium 114 (*)    All other components within normal limits  I-STAT ARTERIAL BLOOD GAS, ED - Abnormal; Notable for the following components:   pH, Arterial 7.095 (*)    pCO2 arterial 23.0 (*)    pO2,  Arterial 557 (*)    Bicarbonate 7.5 (*)    TCO2 8 (*)    Acid-base deficit 22.0 (*)    Sodium 126 (*)    Potassium 2.5 (*)    HCT 37.0 (*)    Hemoglobin 12.6 (*)    All other components within normal limits  CBG MONITORING, ED - Abnormal; Notable for the following components:   Glucose-Capillary >600 (*)    All other components within normal limits  TROPONIN I (HIGH SENSITIVITY) - Abnormal; Notable for the following components:   Troponin I (High Sensitivity) 116 (*)    All other components within normal limits  RESPIRATORY PANEL BY RT PCR (FLU A&B, COVID)  CULTURE, BLOOD (ROUTINE X 2)  CULTURE, BLOOD (ROUTINE X 2)  LIPASE, BLOOD  BLOOD GAS, VENOUS  LACTIC ACID, PLASMA  BETA-HYDROXYBUTYRIC ACID  BETA-HYDROXYBUTYRIC ACID  URINALYSIS, ROUTINE W REFLEX MICROSCOPIC  BASIC METABOLIC PANEL  BASIC METABOLIC PANEL  BASIC METABOLIC PANEL  BASIC METABOLIC PANEL  BETA-HYDROXYBUTYRIC ACID  BASIC METABOLIC PANEL  PROCALCITONIN  PROCALCITONIN  TYPE AND SCREEN  PREPARE RBC (CROSSMATCH)  TROPONIN I (HIGH SENSITIVITY)    EKG EKG Interpretation  Date/Time:  Thursday October 03 2020 22:02:26 EDT Ventricular Rate:  105 PR Interval:    QRS Duration: 102 QT Interval:  360 QTC Calculation: 478 R Axis:   79 Text Interpretation: Sinus tachycardia Right atrial enlargement Borderline T abnormalities, lateral leads Borderline prolonged QT interval No significant change since last tracing Confirmed by Wandra Arthurs [phone]) on 10/03/2020 10:45:27 PM   Radiology DG Abdomen 1 View  Result Date: 10/03/2020 CLINICAL DATA:  Abdominal pain and vomiting EXAM: ABDOMEN - 1 VIEW COMPARISON:  None. FINDINGS: Gastric catheter extends into the stomach. Scattered large and small bowel gas is noted. No obstructive changes are seen. Mild retained fecal material is noted within the right colon. No acute bony abnormality is seen. IMPRESSION: Mild retained fecal material. Gastric catheter within the stomach. No  obstructive change is seen. Electronically Signed   By: Inez Catalina M.D.   On: 10/03/2020 23:51   DG Chest Port 1 View  Result Date: 10/03/2020 CLINICAL DATA:  Nausea and vomiting with decreased appetite and weight loss, initial encounter EXAM: PORTABLE CHEST 1 VIEW COMPARISON:  08/27/2011 FINDINGS: Endotracheal tube is noted 7.5 cm above the carina. Gastric catheter extends into the stomach. Left jugular central line is noted extending towards the left subclavian vein. Cardiac shadow is within normal limits. The lungs are well aerated bilaterally. No focal infiltrate or effusion is seen. No acute bony abnormality is noted. No pneumothorax is noted. IMPRESSION: Tubes and lines as described above. No acute abnormality noted. Electronically Signed   By: Inez Catalina M.D.   On: 10/03/2020 23:42    Procedures Procedures (including critical care time) Patient was intubated and sedated.  Two large bore peripheral lines were placed, then central line (left IJ) was placed.  OG tube was placed with suction.   Medications Ordered in ED Medications  sodium bicarbonate 1 mEq/mL injection (has no administration in time range)  Insulin Regular(Human) in NaCl (MYXREDLIN) 100-0.9 UT/100ML-% infusion (has no administration in time range)  insulin regular (NOVOLIN R) 100 units/mL injection 10 Units (has no administration in time range)  norepinephrine (LEVOPHED) 30m in 2561mpremix infusion (has no administration in time range)  midazolam (VERSED) 5 MG/5ML injection (has no administration in time range)  fentaNYL (SUBLIMAZE) 100 MCG/2ML injection (has no administration in time range)  lactated ringers bolus 20 mL/kg (has no administration in time range)  insulin regular, human (MYXREDLIN) 100 units/ 100 mL infusion (11 Units/hr Intravenous New Bag/Given 10/03/20 2310)  lactated ringers infusion (has no administration in time range)  dextrose 5 % in lactated ringers infusion (has no administration in time  range)  dextrose 50 % solution 0-50 mL (has no administration in time range)  potassium chloride 10 mEq in 100 mL IVPB (has no administration in time range)  propofol (DIPRIVAN) 1000 MG/100ML infusion (has no administration in time range)  propofol (DIPRIVAN) 1000 MG/100ML infusion (12 mcg/kg/min Intravenous Rate/Dose Verify 10/03/20 2358)  0.9 %  sodium chloride infusion (Manually  program via Guardrails IV Fluids) (has no administration in time range)  sodium bicarbonate injection 200 mEq (has no administration in time range)  sodium bicarbonate 150 mEq in sterile water 1,000 mL infusion (has no administration in time range)  sodium bicarbonate 1 mEq/mL injection (has no administration in time range)  sodium bicarbonate 1 mEq/mL injection (has no administration in time range)  sodium chloride 0.9 % bolus 1,000 mL (0 mLs Intravenous Stopped 10/03/20 2304)  vancomycin (VANCOCIN) IVPB 1000 mg/200 mL premix (0 mg Intravenous Stopped 10/04/20 0010)  piperacillin-tazobactam (ZOSYN) IVPB 3.375 g (3.375 g Intravenous New Bag/Given 10/03/20 2255)  sodium bicarbonate injection (50 mEq Intravenous Given 10/03/20 2219)  etomidate (AMIDATE) injection (20 mg Intravenous Given 10/03/20 2223)  rocuronium (ZEMURON) injection (100 mg Intravenous Given 10/03/20 2224)  insulin aspart (novoLOG) 100 UNIT/ML injection (10 Units  Given 10/03/20 2229)  0.9 %  sodium chloride infusion ( Intravenous Stopped 10/03/20 2305)  midazolam (VERSED) 5 MG/5ML injection (2 mg Intravenous Given 10/04/20 0034)  fentaNYL (SUBLIMAZE) injection (100 mcg Intravenous Given 10/03/20 2253)  sodium bicarbonate injection 400 mEq (200 mEq Intravenous Given 10/04/20 0027)    ED Course  I have reviewed the triage vital signs and the nursing notes.  Pertinent labs & imaging results that were available during my care of the patient were reviewed by me and considered in my medical decision making (see chart for details).    MDM Rules/Calculators/A&P                           Mr. Halloran presented to the ED in a delirious state, severely restless and unable to verbalize his symptoms. He was hypothermic, tachycardic and tachypneic with stable blood pressure. His mother notes a history of weight loss x several months associated with frequent vomiting, excessive fluid intake, no food intake, and frequent complaints of abdominal pain, concerning for hyperglycemia. He rapidly declined during my initial interview, becoming hypotensive. Two large bore IV's were placed and he was given 1L NS. Blood cultures were drawn x 2 and VBG was ordered for persistent tachypnea with increased work of breathing. His CBG read > 600 and VBG showed initial pH of 6.981, pCO2 24.7, bicarb of 6.1, concerning for DKA with severe metabolic acidosis. He was given 50 mEq sodium bicarbonate injections x 2 and 10 units of Novolog. Labs were drawn for: CBC with differential, procalcitonin, troponin I, BMP, beta-hydroxybutyrate, lipase, U/A.   He was given etomidate 29m and rocuronium 1061mIV and was bagged and then intubated successfully on second attempt. He received an additional 1L NS x 2 and continued to remain tachycardic and hypotensive. He was given 68m97mersed, 100m74mntanyl IV, Propofol IV, Zosyn 3.375g, Vancomycin, and was placed on a ventilator. A central line was placed successfully in the left IJ. His hypotension resolved without pressors. Orders were placed for CXR. He was started on EndoTool and consult was placed to intensive care. An OG tube was placed, which initially drained about 2L of dark red fluid. Orders were placed for type and crossmatch, and 1 unit of PRBC's was started for GI bleeding.   22:14: CMP showed glucose of 1,062 with bicarb < 7, and anion gap 34 consistent with severe metabolic acidosis in the setting of DKA. Lactic acid severely elevated to 5.8, contributing to acidosis. Creatinine was 2.10, BUN 37, GFR 38. He had severe hyponatremia of 116 and K+ was  normal at 3.9.  Initial CBC showed leukocytosis  with WBC 30.6 with neutrophilia and hemoglobin 14.6.  - See above for management  23:11: ABG showed pH 7.095, pCO2 23.0, bicarb 7.5. Sodium improved to 126 with fluids, although patient developed hypokalemia of 2.5 in setting of recent insulin. Patient was given 33mq KCl IV and started on EndoTool.  Hemoglobin dropped acutely from 16.3 on previous H&H to 12.6 and he was found to have GIB with ~2L dark red/brown output from his OG tube.  He continues to remain hypothermic with temperature of 92 degrees F.  Patient's respiratory panel returned negative for COVID-19 and influenza.  - See above for additional management.   34:40: CXR showed ETT 7.5cm above the carina with no PTX or other acute cardiopulmonary process.  Abdominal XR showed mild retained fecal material in the right colon proper gastric catheter placement and no obvious signs of obstruction.  Type and cross were ordered for RBC transfusion x 1.   Overall, findings consistent with severe metabolic acidosis with lactic acidosis and hypothermia in the setting of DKA. Likely due to long-standing diabetes that had been undiagnosed for some time given patient appears cachectic with severe laboratory derangements. Patient has remained stable albeit his tachycardia on ventilator with sedation. OG tube continues to suction dark red/brown blood although patient is actively receiving a unit of RBC. Patient is continued on cardiac and pulse ox monitoring on EndoTool. Intensive Care was called and [facility] Mother was present for the above and has been informed, with no questions at this time.   Final Clinical Impression(s) / ED Diagnoses Final diagnoses:  Diabetic ketoacidosis with coma associated with other specified diabetes mellitus ([place]  Hypokalemia  Hyponatremia  Lactic acidosis  Acute GI bleeding  Hypothermia, initial encounter  Acute kidney injury  ([place]  High anion gap metabolic acidosis  Tachycardia    Rx / DC Orders ED Discharge Orders    None     RJeralyn Bennett MD 10/04/2020, 12:57 AM Pager: [phone]   SJeralyn Bennett MD 10/04/20 0057    YDrenda Freeze MD 10/08/20 1500

## 2020-10-03 NOTE — Code Documentation (Signed)
CBG reads HIGH

## 2020-10-04 ENCOUNTER — Inpatient Hospital Stay (HOSPITAL_COMMUNITY): Payer: Medicaid Other

## 2020-10-04 DIAGNOSIS — Z681 Body mass index (BMI) 19 or less, adult: Secondary | ICD-10-CM | POA: Diagnosis not present

## 2020-10-04 DIAGNOSIS — E876 Hypokalemia: Secondary | ICD-10-CM | POA: Diagnosis present

## 2020-10-04 DIAGNOSIS — K2970 Gastritis, unspecified, without bleeding: Secondary | ICD-10-CM | POA: Diagnosis present

## 2020-10-04 DIAGNOSIS — R680 Hypothermia, not associated with low environmental temperature: Secondary | ICD-10-CM | POA: Diagnosis present

## 2020-10-04 DIAGNOSIS — E8729 Other acidosis: Secondary | ICD-10-CM | POA: Insufficient documentation

## 2020-10-04 DIAGNOSIS — E10649 Type 1 diabetes mellitus with hypoglycemia without coma: Secondary | ICD-10-CM | POA: Diagnosis not present

## 2020-10-04 DIAGNOSIS — E871 Hypo-osmolality and hyponatremia: Secondary | ICD-10-CM | POA: Diagnosis present

## 2020-10-04 DIAGNOSIS — N179 Acute kidney failure, unspecified: Secondary | ICD-10-CM | POA: Diagnosis present

## 2020-10-04 DIAGNOSIS — Z20822 Contact with and (suspected) exposure to covid-19: Secondary | ICD-10-CM | POA: Diagnosis present

## 2020-10-04 DIAGNOSIS — F17200 Nicotine dependence, unspecified, uncomplicated: Secondary | ICD-10-CM | POA: Diagnosis present

## 2020-10-04 DIAGNOSIS — K922 Gastrointestinal hemorrhage, unspecified: Secondary | ICD-10-CM | POA: Diagnosis not present

## 2020-10-04 DIAGNOSIS — F4000 Agoraphobia, unspecified: Secondary | ICD-10-CM | POA: Diagnosis present

## 2020-10-04 DIAGNOSIS — J9602 Acute respiratory failure with hypercapnia: Secondary | ICD-10-CM | POA: Diagnosis present

## 2020-10-04 DIAGNOSIS — Z791 Long term (current) use of non-steroidal anti-inflammatories (NSAID): Secondary | ICD-10-CM | POA: Diagnosis not present

## 2020-10-04 DIAGNOSIS — K219 Gastro-esophageal reflux disease without esophagitis: Secondary | ICD-10-CM | POA: Diagnosis present

## 2020-10-04 DIAGNOSIS — E440 Moderate protein-calorie malnutrition: Secondary | ICD-10-CM | POA: Diagnosis present

## 2020-10-04 DIAGNOSIS — E1311 Other specified diabetes mellitus with ketoacidosis with coma: Secondary | ICD-10-CM | POA: Diagnosis not present

## 2020-10-04 DIAGNOSIS — E873 Alkalosis: Secondary | ICD-10-CM | POA: Diagnosis present

## 2020-10-04 DIAGNOSIS — Z79899 Other long term (current) drug therapy: Secondary | ICD-10-CM | POA: Diagnosis not present

## 2020-10-04 DIAGNOSIS — Z888 Allergy status to other drugs, medicaments and biological substances status: Secondary | ICD-10-CM | POA: Diagnosis not present

## 2020-10-04 DIAGNOSIS — F209 Schizophrenia, unspecified: Secondary | ICD-10-CM | POA: Diagnosis present

## 2020-10-04 DIAGNOSIS — E1111 Type 2 diabetes mellitus with ketoacidosis with coma: Secondary | ICD-10-CM | POA: Diagnosis not present

## 2020-10-04 DIAGNOSIS — G9341 Metabolic encephalopathy: Secondary | ICD-10-CM | POA: Diagnosis present

## 2020-10-04 DIAGNOSIS — E872 Acidosis: Secondary | ICD-10-CM | POA: Diagnosis not present

## 2020-10-04 DIAGNOSIS — I959 Hypotension, unspecified: Secondary | ICD-10-CM | POA: Diagnosis present

## 2020-10-04 DIAGNOSIS — R64 Cachexia: Secondary | ICD-10-CM | POA: Diagnosis present

## 2020-10-04 DIAGNOSIS — J9601 Acute respiratory failure with hypoxia: Secondary | ICD-10-CM | POA: Diagnosis present

## 2020-10-04 LAB — URINALYSIS, ROUTINE W REFLEX MICROSCOPIC
Bacteria, UA: NONE SEEN
Bilirubin Urine: NEGATIVE
Glucose, UA: >=500 mg/dL — AB
Ketones, ur: 80 mg/dL — AB
Leukocytes,Ua: NEGATIVE
Nitrite: NEGATIVE
Protein, ur: 30 mg/dL — AB
Specific Gravity, Urine: 1.017 (ref 1.005–1.030)
pH: 6 (ref 5.0–8.0)

## 2020-10-04 LAB — BASIC METABOLIC PANEL
Anion gap: 24 — ABNORMAL HIGH (ref 5–15)
Anion gap: 19 — ABNORMAL HIGH (ref 5–15)
Anion gap: 20 — ABNORMAL HIGH (ref 5–15)
Anion gap: 15 (ref 5–15)
Anion gap: 17 — ABNORMAL HIGH (ref 5–15)
Anion gap: 12 (ref 5–15)
BUN: 32 mg/dL — ABNORMAL HIGH (ref 6–20)
BUN: 29 mg/dL — ABNORMAL HIGH (ref 6–20)
BUN: 29 mg/dL — ABNORMAL HIGH (ref 6–20)
BUN: 29 mg/dL — ABNORMAL HIGH (ref 6–20)
BUN: 25 mg/dL — ABNORMAL HIGH (ref 6–20)
BUN: 22 mg/dL — ABNORMAL HIGH (ref 6–20)
CO2: 20 mmol/L — ABNORMAL LOW (ref 22–32)
CO2: 22 mmol/L (ref 22–32)
CO2: 22 mmol/L (ref 22–32)
CO2: 25 mmol/L (ref 22–32)
CO2: 23 mmol/L (ref 22–32)
CO2: 29 mmol/L (ref 22–32)
Calcium: 8.2 mg/dL — ABNORMAL LOW (ref 8.9–10.3)
Calcium: 8.2 mg/dL — ABNORMAL LOW (ref 8.9–10.3)
Calcium: 8.1 mg/dL — ABNORMAL LOW (ref 8.9–10.3)
Calcium: 8.5 mg/dL — ABNORMAL LOW (ref 8.9–10.3)
Calcium: 8.5 mg/dL — ABNORMAL LOW (ref 8.9–10.3)
Calcium: 8.7 mg/dL — ABNORMAL LOW (ref 8.9–10.3)
Chloride: 93 mmol/L — ABNORMAL LOW (ref 98–111)
Chloride: 97 mmol/L — ABNORMAL LOW (ref 98–111)
Chloride: 93 mmol/L — ABNORMAL LOW (ref 98–111)
Chloride: 98 mmol/L (ref 98–111)
Chloride: 99 mmol/L (ref 98–111)
Chloride: 97 mmol/L — ABNORMAL LOW (ref 98–111)
Creatinine, Ser: 1.41 mg/dL — ABNORMAL HIGH (ref 0.61–1.24)
Creatinine, Ser: 1.13 mg/dL (ref 0.61–1.24)
Creatinine, Ser: 1.16 mg/dL (ref 0.61–1.24)
Creatinine, Ser: 1.04 mg/dL (ref 0.61–1.24)
Creatinine, Ser: 1.05 mg/dL (ref 0.61–1.24)
Creatinine, Ser: 0.94 mg/dL (ref 0.61–1.24)
GFR, Estimated: >60 mL/min (ref >60)
GFR, Estimated: >60 mL/min (ref >60)
GFR, Estimated: >60 mL/min (ref >60)
GFR, Estimated: >60 mL/min (ref >60)
GFR, Estimated: >60 mL/min (ref >60)
GFR, Estimated: >60 mL/min (ref >60)
Glucose, Bld: 423 mg/dL — ABNORMAL HIGH (ref 70–99)
Glucose, Bld: 265 mg/dL — ABNORMAL HIGH (ref 70–99)
Glucose, Bld: 220 mg/dL — ABNORMAL HIGH (ref 70–99)
Glucose, Bld: 186 mg/dL — ABNORMAL HIGH (ref 70–99)
Glucose, Bld: 178 mg/dL — ABNORMAL HIGH (ref 70–99)
Glucose, Bld: 139 mg/dL — ABNORMAL HIGH (ref 70–99)
Potassium: 2.4 mmol/L — CL (ref 3.5–5.1)
Potassium: 2.4 mmol/L — CL (ref 3.5–5.1)
Potassium: 2.7 mmol/L — CL (ref 3.5–5.1)
Potassium: 2.9 mmol/L — ABNORMAL LOW (ref 3.5–5.1)
Potassium: 2.9 mmol/L — ABNORMAL LOW (ref 3.5–5.1)
Potassium: 2.6 mmol/L — CL (ref 3.5–5.1)
Sodium: 137 mmol/L (ref 135–145)
Sodium: 138 mmol/L (ref 135–145)
Sodium: 135 mmol/L (ref 135–145)
Sodium: 138 mmol/L (ref 135–145)
Sodium: 139 mmol/L (ref 135–145)
Sodium: 138 mmol/L (ref 135–145)

## 2020-10-04 LAB — TYPE AND SCREEN
ABO/RH(D): A POS
Antibody Screen: NEGATIVE

## 2020-10-04 LAB — GLUCOSE, CAPILLARY
Glucose-Capillary: 520 mg/dL (ref 70–99)
Glucose-Capillary: 386 mg/dL — ABNORMAL HIGH (ref 70–99)
Glucose-Capillary: 421 mg/dL — ABNORMAL HIGH (ref 70–99)
Glucose-Capillary: 363 mg/dL — ABNORMAL HIGH (ref 70–99)
Glucose-Capillary: 297 mg/dL — ABNORMAL HIGH (ref 70–99)
Glucose-Capillary: 245 mg/dL — ABNORMAL HIGH (ref 70–99)
Glucose-Capillary: 183 mg/dL — ABNORMAL HIGH (ref 70–99)
Glucose-Capillary: 210 mg/dL — ABNORMAL HIGH (ref 70–99)
Glucose-Capillary: 215 mg/dL — ABNORMAL HIGH (ref 70–99)
Glucose-Capillary: 214 mg/dL — ABNORMAL HIGH (ref 70–99)
Glucose-Capillary: 315 mg/dL — ABNORMAL HIGH (ref 70–99)
Glucose-Capillary: 249 mg/dL — ABNORMAL HIGH (ref 70–99)
Glucose-Capillary: 119 mg/dL — ABNORMAL HIGH (ref 70–99)
Glucose-Capillary: 98 mg/dL (ref 70–99)
Glucose-Capillary: 177 mg/dL — ABNORMAL HIGH (ref 70–99)
Glucose-Capillary: 249 mg/dL — ABNORMAL HIGH (ref 70–99)
Glucose-Capillary: 191 mg/dL — ABNORMAL HIGH (ref 70–99)
Glucose-Capillary: 144 mg/dL — ABNORMAL HIGH (ref 70–99)
Glucose-Capillary: 155 mg/dL — ABNORMAL HIGH (ref 70–99)
Glucose-Capillary: 140 mg/dL — ABNORMAL HIGH (ref 70–99)
Glucose-Capillary: 173 mg/dL — ABNORMAL HIGH (ref 70–99)
Glucose-Capillary: 150 mg/dL — ABNORMAL HIGH (ref 70–99)

## 2020-10-04 LAB — BLOOD GAS, ARTERIAL
Acid-base deficit: 2.4 mmol/L — ABNORMAL HIGH (ref 0.0–2.0)
Bicarbonate: 19.1 mmol/L — ABNORMAL LOW (ref 20.0–28.0)
FIO2: 30
O2 Saturation: 99.1 %
Patient temperature: 98.2
pCO2 arterial: 25.5 mmHg — ABNORMAL LOW (ref 32.0–48.0)
pH, Arterial: 7.485 — ABNORMAL HIGH (ref 7.350–7.450)
pO2, Arterial: 152 mmHg — ABNORMAL HIGH (ref 83.0–108.0)

## 2020-10-04 LAB — PREPARE RBC (CROSSMATCH)

## 2020-10-04 LAB — TRIGLYCERIDES: Triglycerides: 649 mg/dL — ABNORMAL HIGH (ref <150)

## 2020-10-04 LAB — BPAM RBC
Blood Product Expiration Date: 202111252359
ISSUE DATE / TIME: 202111042340
Unit Type and Rh: 9500

## 2020-10-04 LAB — HEMOGLOBIN A1C
Hgb A1c MFr Bld: 13.4 % — ABNORMAL HIGH (ref 4.8–5.6)
Mean Plasma Glucose: 337.88 mg/dL

## 2020-10-04 LAB — BETA-HYDROXYBUTYRIC ACID
Beta-Hydroxybutyric Acid: >8 mmol/L — ABNORMAL HIGH (ref 0.05–0.27)
Beta-Hydroxybutyric Acid: 1.96 mmol/L — ABNORMAL HIGH (ref 0.05–0.27)
Beta-Hydroxybutyric Acid: 0.9 mmol/L — ABNORMAL HIGH (ref 0.05–0.27)

## 2020-10-04 LAB — LACTIC ACID, PLASMA
Lactic Acid, Venous: 8.4 mmol/L (ref 0.5–1.9)
Lactic Acid, Venous: 5 mmol/L (ref 0.5–1.9)
Lactic Acid, Venous: 3.9 mmol/L (ref 0.5–1.9)

## 2020-10-04 LAB — PROCALCITONIN: Procalcitonin: 3.64 ng/mL

## 2020-10-04 LAB — MRSA PCR SCREENING: MRSA by PCR: NEGATIVE

## 2020-10-04 LAB — HIV ANTIBODY (ROUTINE TESTING W REFLEX): HIV Screen 4th Generation wRfx: NONREACTIVE

## 2020-10-04 MED ORDER — SODIUM BICARBONATE 8.4 % IV SOLN
INTRAVENOUS | Status: AC
  Filled 2020-10-04: qty 100

## 2020-10-04 MED ORDER — SODIUM BICARBONATE 8.4 % IV SOLN
200.0000 meq | Freq: Once | INTRAVENOUS | Status: AC
  Administered 2020-10-04: 200 meq via INTRAVENOUS

## 2020-10-04 MED ORDER — ORAL CARE MOUTH RINSE
15.0000 mL | OROMUCOSAL | Status: DC
  Administered 2020-10-04: 15 mL via OROMUCOSAL

## 2020-10-04 MED ORDER — FLUOXETINE HCL 20 MG PO CAPS
40.0000 mg | ORAL_CAPSULE | Freq: Every day | ORAL | Status: DC
  Administered 2020-10-04 – 2020-10-07 (×4): 40 mg via ORAL
  Filled 2020-10-04 (×4): qty 2

## 2020-10-04 MED ORDER — POTASSIUM CHLORIDE CRYS ER 20 MEQ PO TBCR
40.0000 meq | EXTENDED_RELEASE_TABLET | Freq: Two times a day (BID) | ORAL | Status: AC
  Administered 2020-10-04 (×2): 40 meq via ORAL
  Filled 2020-10-04 (×2): qty 2

## 2020-10-04 MED ORDER — DEXTROSE 50 % IV SOLN: 0.0000 mL | INTRAVENOUS | Status: DC | PRN

## 2020-10-04 MED ORDER — STERILE WATER FOR INJECTION IV SOLN
INTRAVENOUS | Status: DC
  Filled 2020-10-04: qty 850

## 2020-10-04 MED ORDER — SODIUM CHLORIDE 0.9 % IV SOLN
2.0000 g | INTRAVENOUS | Status: DC
  Administered 2020-10-04 – 2020-10-05 (×2): 2 g via INTRAVENOUS
  Filled 2020-10-04: qty 20
  Filled 2020-10-04: qty 2

## 2020-10-04 MED ORDER — FENTANYL BOLUS VIA INFUSION
50.0000 ug | INTRAVENOUS | Status: DC | PRN
  Administered 2020-10-04 (×2): 50 ug via INTRAVENOUS
  Filled 2020-10-04: qty 50

## 2020-10-04 MED ORDER — PANTOPRAZOLE SODIUM 40 MG IV SOLR
40.0000 mg | Freq: Every day | INTRAVENOUS | Status: DC
  Administered 2020-10-04 – 2020-10-05 (×2): 40 mg via INTRAVENOUS
  Filled 2020-10-04 (×2): qty 40

## 2020-10-04 MED ORDER — HEPARIN SODIUM (PORCINE) 5000 UNIT/ML IJ SOLN
5000.0000 [IU] | Freq: Three times a day (TID) | INTRAMUSCULAR | Status: DC
  Administered 2020-10-04 – 2020-10-06 (×6): 5000 [IU] via SUBCUTANEOUS
  Filled 2020-10-04 (×6): qty 1

## 2020-10-04 MED ORDER — BENZTROPINE MESYLATE 0.5 MG PO TABS
1.0000 mg | ORAL_TABLET | Freq: Every day | ORAL | Status: DC
  Administered 2020-10-04 – 2020-10-07 (×4): 1 mg via ORAL
  Filled 2020-10-04 (×4): qty 2

## 2020-10-04 MED ORDER — LACTATED RINGERS IV SOLN: INTRAVENOUS | Status: DC

## 2020-10-04 MED ORDER — CHLORHEXIDINE GLUCONATE 0.12% ORAL RINSE (MEDLINE KIT)
15.0000 mL | Freq: Two times a day (BID) | OROMUCOSAL | Status: DC
  Administered 2020-10-04 (×2): 15 mL via OROMUCOSAL

## 2020-10-04 MED ORDER — INSULIN REGULAR(HUMAN) IN NACL 100-0.9 UT/100ML-% IV SOLN
INTRAVENOUS | Status: DC
  Administered 2020-10-04: 6.5 [IU]/h via INTRAVENOUS
  Filled 2020-10-04: qty 100

## 2020-10-04 MED ORDER — FENTANYL CITRATE (PF) 100 MCG/2ML IJ SOLN: 50.0000 ug | Freq: Once | INTRAMUSCULAR | Status: DC

## 2020-10-04 MED ORDER — FENTANYL 2500MCG IN NS 250ML (10MCG/ML) PREMIX INFUSION
0.0000 ug/h | INTRAVENOUS | Status: DC
  Administered 2020-10-04: 50 ug/h via INTRAVENOUS
  Filled 2020-10-04: qty 250

## 2020-10-04 MED ORDER — POTASSIUM CHLORIDE 20 MEQ/15ML (10%) PO SOLN
40.0000 meq | Freq: Once | ORAL | Status: AC
  Administered 2020-10-04: 40 meq via ORAL
  Filled 2020-10-04: qty 30

## 2020-10-04 MED ORDER — DEXTROSE IN LACTATED RINGERS 5 % IV SOLN: INTRAVENOUS | Status: DC

## 2020-10-04 MED ORDER — FLUPHENAZINE HCL 5 MG PO TABS
5.0000 mg | ORAL_TABLET | Freq: Every day | ORAL | Status: DC
  Administered 2020-10-04 – 2020-10-06 (×3): 5 mg via ORAL
  Filled 2020-10-04 (×3): qty 1

## 2020-10-04 MED ORDER — LIVING WELL WITH DIABETES BOOK
Freq: Once | Status: AC
  Filled 2020-10-04: qty 1

## 2020-10-04 MED ORDER — LACTATED RINGERS IV BOLUS
1000.0000 mL | Freq: Once | INTRAVENOUS | Status: AC
  Administered 2020-10-04: 1000 mL via INTRAVENOUS

## 2020-10-04 MED ORDER — POTASSIUM CHLORIDE 10 MEQ/100ML IV SOLN
10.0000 meq | INTRAVENOUS | Status: AC
  Administered 2020-10-04 – 2020-10-05 (×4): 10 meq via INTRAVENOUS
  Filled 2020-10-04 (×3): qty 100

## 2020-10-04 MED ORDER — LACTATED RINGERS IV BOLUS
500.0000 mL | Freq: Once | INTRAVENOUS | Status: AC
  Administered 2020-10-04: 500 mL via INTRAVENOUS

## 2020-10-04 MED ORDER — POTASSIUM CHLORIDE 10 MEQ/100ML IV SOLN
10.0000 meq | INTRAVENOUS | Status: AC
  Administered 2020-10-04 (×4): 10 meq via INTRAVENOUS
  Filled 2020-10-04 (×4): qty 100

## 2020-10-04 MED ORDER — FLUPHENAZINE HCL 5 MG PO TABS: 10.0000 mg | ORAL_TABLET | Freq: Every day | ORAL | Status: DC

## 2020-10-04 MED ORDER — SODIUM BICARBONATE 8.4 % IV SOLN
400.0000 meq | Freq: Once | INTRAVENOUS | Status: AC
  Administered 2020-10-04: 200 meq via INTRAVENOUS

## 2020-10-04 MED ORDER — NOREPINEPHRINE 4 MG/250ML-% IV SOLN: 0.0000 ug/min | INTRAVENOUS | Status: DC

## 2020-10-04 MED ORDER — POTASSIUM CHLORIDE 10 MEQ/50ML IV SOLN
10.0000 meq | INTRAVENOUS | Status: DC
Start: 2020-10-04 — End: 2020-10-04

## 2020-10-04 MED ORDER — PROPOFOL 1000 MG/100ML IV EMUL: 5.0000 ug/kg/min | INTRAVENOUS | Status: DC

## 2020-10-04 MED ORDER — POTASSIUM CHLORIDE CRYS ER 20 MEQ PO TBCR
40.0000 meq | EXTENDED_RELEASE_TABLET | Freq: Once | ORAL | Status: AC
  Administered 2020-10-04: 40 meq via ORAL
  Filled 2020-10-04: qty 2

## 2020-10-04 MED ORDER — HALOPERIDOL LACTATE 5 MG/ML IJ SOLN
5.0000 mg | Freq: Four times a day (QID) | INTRAMUSCULAR | Status: DC | PRN
  Administered 2020-10-04 (×2): 5 mg via INTRAVENOUS
  Filled 2020-10-04 (×2): qty 1

## 2020-10-04 MED ORDER — CHLORHEXIDINE GLUCONATE CLOTH 2 % EX PADS
6.0000 | MEDICATED_PAD | Freq: Every day | CUTANEOUS | Status: DC
  Administered 2020-10-06 – 2020-10-07 (×2): 6 via TOPICAL

## 2020-10-04 MED ORDER — ORAL CARE MOUTH RINSE
15.0000 mL | Freq: Two times a day (BID) | OROMUCOSAL | Status: DC
  Administered 2020-10-04 – 2020-10-05 (×2): 15 mL via OROMUCOSAL

## 2020-10-04 MED ORDER — PROPOFOL 1000 MG/100ML IV EMUL
5.0000 ug/kg/min | INTRAVENOUS | Status: DC
  Administered 2020-10-04: 30 ug/kg/min via INTRAVENOUS
  Filled 2020-10-04: qty 100

## 2020-10-04 NOTE — Progress Notes (Signed)
[facility] Progress Note Patient Name: Tom Anderson DOB: [birth year] MRN: [id number]   Date of Service  10/04/2020  HPI/Events of Note  The pH = 7.485 after 6 amps NaHCO3 and insulin IV infusion.   eICU Interventions  Plan: 1. D/C NaHCO3 IV infusion.      Intervention Category Major Interventions: Acid-Base disturbance - evaluation and management  Lanita Stammen [place] 10/04/2020, 6:04 AM

## 2020-10-04 NOTE — Progress Notes (Signed)
[facility] Progress Note Patient Name: Haskel Dewalt DOB: 1971/11/20 MRN: [id number]   Date of Service  10/04/2020  HPI/Events of Note  K+ 2.6, Lactic acid 3.9.  eICU Interventions  LR 1000 ml iv x 1, KCL 10 meq iv Q I hour x 4.        Kerry Kass Tkai Serfass 10/04/2020, 9:39 PM

## 2020-10-04 NOTE — Progress Notes (Signed)
CRITICAL VALUE ALERT  Critical Value:  K+ - 2.4  Date & Time Notied:  10/04/2020 @ 0340  Provider Notified: E-Link Nurse notified.  Orders Received/Actions taken: Orders in place already.

## 2020-10-04 NOTE — Progress Notes (Signed)
CRITICAL VALUE ALERT  Critical Value:  Lactic Acid 8.4  Date & Time Notied:  10/04/20 1200  Provider Notified: Jerral Ralph, CCM MD  Orders Received/Actions taken: LR Bolus 500 x1 repeat lactic.

## 2020-10-04 NOTE — Procedures (Signed)
Extubation Procedure Note  Patient Details:   Name: Tom Anderson DOB: October 13, 1971 MRN: [id number]   Airway Documentation:    Vent end date: 10/04/20 Vent end time: 0912   Evaluation  O2 sats: stable throughout Complications: No apparent complications Patient did tolerate procedure well. Bilateral Breath Sounds: Clear, Rhonchi   Pt placed on 4L  Tamera Reason 10/04/2020, 9:12 AM

## 2020-10-04 NOTE — Progress Notes (Signed)
CRITICAL VALUE ALERT  Critical Value: Potassium  Date & Time Notied:  10/04/2020 2030  Provider Notified: Warren Lacy  Orders Received/Actions taken: Awaiting further orders

## 2020-10-04 NOTE — ED Notes (Signed)
CURRENTLY ON BAIR HUGGER SECONDARY TO HYPOTHERMIA

## 2020-10-04 NOTE — ED Notes (Signed)
OG TUBE IN PLACE - 56CM FROM CORNER OF MOUTH TO BLUE PORT - 40mHg LOW INT SX - NOTED TO IMMEDIATELY PRODUCED FROM OG TUBE WAS 1750ML OF DARK/ BLOODY EMESIS (EDP WAS INFORMED, O NEGATIVE BLOOD TRANSFUSING CURRENTLY)

## 2020-10-04 NOTE — Progress Notes (Signed)
Existing line pressure transduced at [facility], fine to use. Bolusing more fluid for hypotension, can use pressors if not working.

## 2020-10-04 NOTE — ED Notes (Signed)
[facility] AT BEDSIDE

## 2020-10-04 NOTE — Progress Notes (Signed)
[facility] Progress Note Patient Name: Tom Anderson DOB: 13-Dec-1970 MRN: [id number]   Date of Service  10/04/2020  HPI/Events of Note  ABG on 100%/PRVC 30/TV 560/P 5 = 7.09/23.0/557/7.5. No room to further increase respiratory compensation. NaHCO3 IV infusion can't be prepared at [facility] ED.   eICU Interventions  Plan: 1. NaHCO3 200 meq IV now. 2. NaHCO3 100 meq IV in route to [facility]. 3. ABG on arrival to [facility] ICU.      Intervention Category Major Interventions: Acid-Base disturbance - evaluation and management;Respiratory failure - evaluation and management  Haide Klinker Cornelia Copa 10/04/2020, 12:42 AM

## 2020-10-04 NOTE — Progress Notes (Addendum)
   NAME:  Tom Anderson, MRN:  [id number], DOB:  1971/03/30, LOS: 0 ADMISSION DATE:  10/03/2020, CONSULTATION DATE:  10/03/20 REFERRING MD:  Darl Householder, CHIEF COMPLAINT:  confusion   Brief History   49 year old man with hx of poor medical f/u presenting with DKA.   Past Medical History  Schizophrenia on prolixin and cogentin  Significant Hospital Events   10/03/20 ER/intubated/line>> [facility]->removed 11/5  Consults:  n/a  Procedures:  n/a  Significant Diagnostic Tests:  CXR hyperexpanded lungs, ?central line in LIJ not crossing midline Lactate 6 on admission  Micro Data:  COVID neg  Antimicrobials:  10/03/20: vanc/zosyn x 1 in ER 11/4>> ceftriaxone   Interim history/subjective:  Awake. Agitated earlier. Evaluation on PSV 5 w/ VTs in 900 ml range.   Objective   Blood pressure (Abnormal) 131/97, pulse (Abnormal) 106, temperature (Abnormal) 97.5 F (36.4 C), resp. rate (Abnormal) 26, height 5' 9"  (1.753 m), weight 56.1 kg, SpO2 100 %. CVP:  [8 mmHg-16 mmHg] 16 mmHg  Vent Mode: PRVC FiO2 (%):  [0.8 %-50 %] 0.8 % Set Rate:  [30 bmp] 30 bmp Vt Set:  [560 mL] 560 mL PEEP:  [5 cmH20] 5 cmH20 Plateau Pressure:  [14 cmH20-17 cmH20] 17 cmH20   Intake/Output Summary (Last 24 hours) at 10/04/2020 0086 Last data filed at 10/04/2020 0700 Gross per 24 hour  Intake 3758.83 ml  Output 1850 ml  Net 1908.83 ml   Filed Weights   10/04/20 0100 10/04/20 0420  Weight: 73.9 kg 56.1 kg    Examination:  General 49 year old chronically ill white male. Now on PSV VTs 900s. No accessory use HENT NCAT orally intubated The left IJ cath was removed.  Pulm clear excellent VTs Card RRR abd soft not tender + bowel sounds Neuro awake now and moving all ext brisk CR GU clear yellow  Ext warm and dry  Resolved Hospital Problem list   AKI  Assessment & Plan:  DKA ->still has high anion gap BUT bicarb improved.  Plan Cont insulin infusion and DKA protocol Serial cbgs and chemistries.  Additional  recs per the diabetic coordinator  Cont to trend beta hydroxybutyric acid (it is also better) Repeat LA   Question of GIB- given unit of blood at OSH for "bloody OGT output" Plan Cont PPI Holding AC for now  Trend cbc  Fluid and Electrolyte imbalance: Hypokalemia related to GI losses; had both anion gap acidosis and metabolic alkalosis  Plan Replace K Serial chemistries   Respiratory failure due to inability to compensate for DKA now on ventilator -PCXR clear ett good position -has both resp alkalosis and metabolic acidosis  Plan Extubate NPO except meds Pulse ox   Schizophrenia- continue cogentin, may need to discuss fluphenazine vs. Another agent Plan Resume home meds PRN haldol  Leukocytosis, SIRS- could all be related to DKA, given vanc/zosym at [facility] mildly elevated PCT Plan Await UA and blood cultures Day 2 CTX     Best practice:  Diet: NPO Pain/Anxiety/Delirium protocol (if indicated): fentanyl, propofol->DC 11/5 VAP protocol (if indicated): in place dc 11/5 DVT prophylaxis: SCDs GI prophylaxis: ppi Glucose control: insulin gtt Mobility: BR Code Status: full Family Communication: called and updated father Cecilio Asper at [phone] Disposition: ICU pending vent liberation and correction of electrolyte abnormalities  Critical care time: 32 min   Erick Colace ACNP-BC [place] Pager # [phone] OR # [phone] if no answer

## 2020-10-04 NOTE — ED Notes (Signed)
MECHANICAL VENTILATION SETTINGS: PRVC // 560 ML TV // RR - 30/MIN // FIO2 .50 // 5 PEEP ETT - 7.5 @ 23CM AT GUMS

## 2020-10-04 NOTE — ED Notes (Signed)
Wasted 1MG IV versed in sink with Jaci Carrel RN and this RN.

## 2020-10-05 DIAGNOSIS — E1311 Other specified diabetes mellitus with ketoacidosis with coma: Secondary | ICD-10-CM

## 2020-10-05 DIAGNOSIS — K922 Gastrointestinal hemorrhage, unspecified: Secondary | ICD-10-CM | POA: Diagnosis not present

## 2020-10-05 DIAGNOSIS — E1111 Type 2 diabetes mellitus with ketoacidosis with coma: Secondary | ICD-10-CM | POA: Diagnosis not present

## 2020-10-05 LAB — GLUCOSE, CAPILLARY
Glucose-Capillary: 101 mg/dL — ABNORMAL HIGH (ref 70–99)
Glucose-Capillary: 105 mg/dL — ABNORMAL HIGH (ref 70–99)
Glucose-Capillary: 130 mg/dL — ABNORMAL HIGH (ref 70–99)
Glucose-Capillary: 142 mg/dL — ABNORMAL HIGH (ref 70–99)
Glucose-Capillary: 161 mg/dL — ABNORMAL HIGH (ref 70–99)
Glucose-Capillary: 168 mg/dL — ABNORMAL HIGH (ref 70–99)
Glucose-Capillary: 173 mg/dL — ABNORMAL HIGH (ref 70–99)
Glucose-Capillary: 186 mg/dL — ABNORMAL HIGH (ref 70–99)
Glucose-Capillary: 186 mg/dL — ABNORMAL HIGH (ref 70–99)
Glucose-Capillary: 195 mg/dL — ABNORMAL HIGH (ref 70–99)
Glucose-Capillary: 196 mg/dL — ABNORMAL HIGH (ref 70–99)
Glucose-Capillary: 207 mg/dL — ABNORMAL HIGH (ref 70–99)
Glucose-Capillary: 230 mg/dL — ABNORMAL HIGH (ref 70–99)
Glucose-Capillary: 64 mg/dL — ABNORMAL LOW (ref 70–99)
Glucose-Capillary: 91 mg/dL (ref 70–99)

## 2020-10-05 LAB — CULTURE, BLOOD (ROUTINE X 2)
Culture: NO GROWTH
Culture: NO GROWTH
Special Requests: ADEQUATE

## 2020-10-05 LAB — COMPREHENSIVE METABOLIC PANEL
ALT: 26 U/L (ref 0–44)
AST: 61 U/L — ABNORMAL HIGH (ref 15–41)
Albumin: 3.4 g/dL — ABNORMAL LOW (ref 3.5–5.0)
Alkaline Phosphatase: 59 U/L (ref 38–126)
Anion gap: 14 (ref 5–15)
BUN: 10 mg/dL (ref 6–20)
CO2: 24 mmol/L (ref 22–32)
Calcium: 8.4 mg/dL — ABNORMAL LOW (ref 8.9–10.3)
Chloride: 97 mmol/L — ABNORMAL LOW (ref 98–111)
Creatinine, Ser: 0.77 mg/dL (ref 0.61–1.24)
GFR, Estimated: >60 mL/min (ref >60)
Glucose, Bld: 80 mg/dL (ref 70–99)
Potassium: 3 mmol/L — ABNORMAL LOW (ref 3.5–5.1)
Sodium: 135 mmol/L (ref 135–145)
Total Bilirubin: 0.7 mg/dL (ref 0.3–1.2)
Total Protein: 5.4 g/dL — ABNORMAL LOW (ref 6.5–8.1)

## 2020-10-05 MED ORDER — INSULIN ASPART 100 UNIT/ML ~~LOC~~ SOLN
0.0000 [IU] | Freq: Three times a day (TID) | SUBCUTANEOUS | Status: DC
  Administered 2020-10-05: 5 [IU] via SUBCUTANEOUS
  Administered 2020-10-06: 3 [IU] via SUBCUTANEOUS
  Administered 2020-10-06: 5 [IU] via SUBCUTANEOUS

## 2020-10-05 MED ORDER — INSULIN ASPART 100 UNIT/ML ~~LOC~~ SOLN: 0.0000 [IU] | Freq: Every day | SUBCUTANEOUS | Status: DC

## 2020-10-05 MED ORDER — ONDANSETRON HCL 4 MG/2ML IJ SOLN: 4.0000 mg | Freq: Four times a day (QID) | INTRAMUSCULAR | Status: DC | PRN

## 2020-10-05 MED ORDER — NEPRO/CARBSTEADY PO LIQD
237.0000 mL | Freq: Three times a day (TID) | ORAL | Status: DC
  Administered 2020-10-05 – 2020-10-07 (×5): 237 mL via ORAL
  Filled 2020-10-05 (×9): qty 237

## 2020-10-05 MED ORDER — ADULT MULTIVITAMIN W/MINERALS CH
1.0000 | ORAL_TABLET | Freq: Every day | ORAL | Status: DC
  Administered 2020-10-06 – 2020-10-07 (×2): 1 via ORAL
  Filled 2020-10-05 (×2): qty 1

## 2020-10-05 MED ORDER — ONDANSETRON HCL 4 MG/2ML IJ SOLN
INTRAMUSCULAR | Status: AC
  Administered 2020-10-05: 4 mg via INTRAVENOUS
  Filled 2020-10-05: qty 2

## 2020-10-05 MED ORDER — CHLORHEXIDINE GLUCONATE 0.12 % MT SOLN
OROMUCOSAL | Status: AC
  Administered 2020-10-05: 15 mL via OROMUCOSAL
  Filled 2020-10-05: qty 15

## 2020-10-05 MED ORDER — ACETAMINOPHEN 325 MG PO TABS
650.0000 mg | ORAL_TABLET | ORAL | Status: DC | PRN
  Administered 2020-10-05: 650 mg via ORAL
  Filled 2020-10-05: qty 2

## 2020-10-05 MED ORDER — INSULIN DETEMIR 100 UNIT/ML ~~LOC~~ SOLN
10.0000 [IU] | Freq: Two times a day (BID) | SUBCUTANEOUS | Status: DC
  Administered 2020-10-05 – 2020-10-06 (×3): 10 [IU] via SUBCUTANEOUS
  Filled 2020-10-05 (×3): qty 0.1

## 2020-10-05 NOTE — Progress Notes (Signed)
Initial Nutrition Assessment  DOCUMENTATION CODES:   Underweight  INTERVENTION:   Nepro Shake po TID, each supplement provides 425 kcal and 19 grams protein  MVI daily   Pt at high refeed risk; recommend monitor potassium, magnesium and phosphorus labs daily as oral intake improves   NUTRITION DIAGNOSIS:   Inadequate oral intake related to acute illness as evidenced by other (comment) (per chart review).  GOAL:   Patient will meet greater than or equal to 90% of their needs  MONITOR:   PO intake, Supplement acceptance, Labs, Weight trends, Skin, I & O's  REASON FOR ASSESSMENT:   Malnutrition Screening Tool    ASSESSMENT:   49 y/o male with h/o schizophrenia who is admitted with DKA  RD working remotely.  Unable to reach pt by phone. Suspect pt with poor appetite and oral intake at baseline as pt is underweight. Pt extubated 11/5. Pt initiated on a diet today. RD will add supplements and MVI to help pt meet his estimated needs. Pt is likely at refeed risk. RD will obtain nutrition related history and exam at follow-up. Pt is at high risk for malnutrition.   There are no recent documented weights to determine if any significant weight changes.   Medications reviewed and include: heparin, insulin, protonix   Labs reviewed: K 3.0(L) cbgs- 196, 161, 130, 91, 142 x 24 hrs AIC 13.4(H) 11/5  NUTRITION - FOCUSED PHYSICAL EXAM: Unable to perform at this time   Diet Order:   Diet Order            Diet Carb Modified Fluid consistency: Thin; Room service appropriate? Yes  Diet effective now                EDUCATION NEEDS:   Not appropriate for education at this time  Skin:  Skin Assessment: Reviewed RN Assessment (petechiae)  Last BM:  PTA  Height:   Ht Readings from Last 1 Encounters:  10/05/20 5' 10"  (1.778 m)    Weight:   Wt Readings from Last 1 Encounters:  10/05/20 59.1 kg    Ideal Body Weight:  75.45 kg  BMI:  Body mass index is 18.7  kg/m.  Estimated Nutritional Needs:   Kcal:  1900-2200kcal/day  Protein:  95-110g/day  Fluid:  1.8-2.1L/day  Koleen Distance MS, RD, LDN Please refer to [facility] for RD and/or RD on-call/weekend/after hours pager

## 2020-10-05 NOTE — Progress Notes (Signed)
eLink Physician-Brief Progress Note Patient Name: Tom Anderson DOB: [birth year] MRN: [id number]   Date of Service  10/05/2020  HPI/Events of Note  Sitter order needs to be renewed.  eICU Interventions  Order renewed.        Kerry Kass [name] 10/05/2020, 4:29 AM

## 2020-10-05 NOTE — Progress Notes (Signed)
Inpatient Diabetes Program Recommendations  AACE/ADA: New Consensus Statement on Inpatient Glycemic Control (2015)  Target Ranges:  Prepandial:   less than 140 mg/dL      Peak postprandial:   less than 180 mg/dL (1-2 hours)      Critically ill patients:  140 - 180 mg/dL   Lab Results  Component Value Date   GLUCAP 142 (H) 10/05/2020   HGBA1C 13.4 (H) 10/04/2020    Review of Glycemic Control  Diabetes history: New onset DM Outpatient Diabetes medications: N/A Current orders for Inpatient glycemic control: Levemir 10 units bid, Novolog 0-15 units tidwc  HgbA1C - 13.4% DKA resolved Transitioned to Lantus and Novolog  Inpatient Diabetes Program Recommendations:     Will likely need meal coverage insulin - Novolog 3 units tidwc  Spoke with mother at bedside on 11/05 regarding new diagnosis. Ordered Living Well with Diabetes book and will teach insulin pen administration on 11/8. Mother states she will be responsible for checking CBGs and giving pt his insulin.  Will follow glucose trends in am.   Thank you. Lorenda Peck, RD, LDN, CDE Inpatient Diabetes Coordinator [phone]

## 2020-10-05 NOTE — Progress Notes (Signed)
Received from ICU via wheelchair.  Alert and oriented, skin warm and dry.  He was able to respond back to how to contact the nurse if he had needs.  He also voiced understanding of the need not to get up without assistance.   Bed alarm on the second level.

## 2020-10-05 NOTE — Progress Notes (Addendum)
NAME:  Tom Anderson, MRN:  [id number], DOB:  04/21/1971, LOS: 1 ADMISSION DATE:  10/03/2020, CONSULTATION DATE:  11/4 REFERRING MD:  Darl Householder, CHIEF COMPLAINT:  Confusion   Brief History   49 year old man with hx of poor medical f/u presenting with DKA.  Past Medical History  Schizophrenia  Significant Hospital Events   10/03/20 ER/intubated/line>> [facility]->removed 11/5  Consults:  n/a  Procedures:    Significant Diagnostic Tests:    Micro Data:  11/5 SARS COV 2 > neg  Antimicrobials:  10/03/20: vanc/zosyn x 1 in ER 11/4>> ceftriaxone  Interim history/subjective:  Feels OK, mild nausea/abdominal pain Labs OK Not transitioning off insulin drip for some reason Hungry   Objective   Blood pressure (!) 133/97, pulse 100, temperature 98.7 F (37.1 C), temperature source Axillary, resp. rate (!) 21, height 5' 9"  (1.753 m), weight 56.1 kg, SpO2 99 %.    Vent Mode: PRVC FiO2 (%):  [0.8 %] 0.8 % Set Rate:  [30 bmp] 30 bmp Vt Set:  [560 mL] 560 mL PEEP:  [5 cmH20] 5 cmH20 Plateau Pressure:  [17 cmH20] 17 cmH20   Intake/Output Summary (Last 24 hours) at 10/05/2020 3664 Last data filed at 10/05/2020 4034 Gross per 24 hour  Intake 3379.28 ml  Output 2075 ml  Net 1304.28 ml   Filed Weights   10/04/20 0100 10/04/20 0420  Weight: 73.9 kg 56.1 kg    Examination:  General:  Resting comfortably in bed HENT: NCAT OP clear PULM: CTA B, normal effort CV: RRR, no mgr GI: BS+, soft, nontender MSK: normal bulk and tone Neuro: awake, alert, no distress, MAEW   Resolved Hospital Problem list     Assessment & Plan:  DKA > resolved New diagnosis of DM, presumably DM2 Check Hgb A1c, c-peptide in AM Transition from insulin infusion to detemir and SSI Carb modified diet Diabetes coordinator consult  GIB?> no evidence of that Nausea, mild abdominal pain with normal abdominal exam, presumably DKA related Prn zofran Consider abdominal imaging if worse  Schizophrenia Agitated  encephalopathy> improved Cogentin Prolixin Prn haldol  Best practice:  Diet: carb modified Pain/Anxiety/Delirium protocol (if indicated): d/c VAP protocol (if indicated): n/a DVT prophylaxis: sub q hep GI prophylaxis: n/a Glucose control: as above Mobility: bed rest Code Status: full Family Communication: I updated his mother bedside Disposition: remain in ICU  Labs   CBC: Recent Labs  Lab 10/03/20 2209 10/03/20 2214 10/03/20 2311  WBC 30.6*  --   --   NEUTROABS 26.2*  --   --   HGB 14.6 16.3 12.6*  HCT 41.7 48.0 37.0*  MCV 90.8  --   --   PLT 345  --   --     Basic Metabolic Panel: Recent Labs  Lab 10/04/20 0539 10/04/20 0631 10/04/20 0940 10/04/20 1347 10/04/20 1834  NA 138 135 138 139 138  K 2.4* 2.7* 2.9* 2.9* 2.6*  CL 97* 93* 98 99 97*  CO2 22 22 25 23 29   GLUCOSE 265* 220* 186* 178* 139*  BUN 29* 29* 29* 25* 22*  CREATININE 1.13 1.16 1.04 1.05 0.94  CALCIUM 8.2* 8.1* 8.5* 8.5* 8.7*   GFR: Estimated Creatinine Clearance: 77.1 mL/min (by C-G formula based on SCr of 0.94 mg/dL). Recent Labs  Lab 10/03/20 2209 10/03/20 2210 10/04/20 0631 10/04/20 1112 10/04/20 1347 10/04/20 1834  PROCALCITON  --   --  3.64  --   --   --   WBC 30.6*  --   --   --   --   --  LATICACIDVEN  --  5.8*  --  8.4* 5.0* 3.9*    Liver Function Tests: Recent Labs  Lab 10/03/20 2209  AST 25  ALT 22  ALKPHOS 104  BILITOT 1.3*  PROT 7.6  ALBUMIN 4.8   Recent Labs  Lab 10/03/20 2209  LIPASE 29   No results for input(s): AMMONIA in the last 168 hours.  ABG    Component Value Date/Time   PHART 7.485 (H) 10/04/2020 0210   PCO2ART 25.5 (L) 10/04/2020 0210   PO2ART 152 (H) 10/04/2020 0210   HCO3 19.1 (L) 10/04/2020 0210   TCO2 8 (L) 10/03/2020 2311   ACIDBASEDEF 2.4 (H) 10/04/2020 0210   O2SAT 99.1 10/04/2020 0210     Coagulation Profile: No results for input(s): INR, PROTIME in the last 168 hours.  Cardiac Enzymes: No results for input(s): CKTOTAL,  CKMB, CKMBINDEX, TROPONINI in the last 168 hours.  HbA1C: Hgb A1c MFr Bld  Date/Time Value Ref Range Status  10/04/2020 02:37 AM 13.4 (H) 4.8 - 5.6 % Final    Comment:    (NOTE) Pre diabetes:          5.7%-6.4%  Diabetes:              >6.4%  Glycemic control for   <7.0% adults with diabetes     CBG: Recent Labs  Lab 10/05/20 0301 10/05/20 0416 10/05/20 0515 10/05/20 0613 10/05/20 0713  GLUCAP 186* 207* 186* 196* 161*     Critical care time: n/a     Roselie Awkward, MD [place] [facility] Pager: [phone] Cell: [phone] If no response, call [phone]

## 2020-10-06 DIAGNOSIS — E1111 Type 2 diabetes mellitus with ketoacidosis with coma: Secondary | ICD-10-CM | POA: Diagnosis not present

## 2020-10-06 DIAGNOSIS — J9601 Acute respiratory failure with hypoxia: Secondary | ICD-10-CM | POA: Diagnosis not present

## 2020-10-06 DIAGNOSIS — E1069 Type 1 diabetes mellitus with other specified complication: Secondary | ICD-10-CM

## 2020-10-06 DIAGNOSIS — F209 Schizophrenia, unspecified: Secondary | ICD-10-CM

## 2020-10-06 DIAGNOSIS — G9341 Metabolic encephalopathy: Secondary | ICD-10-CM

## 2020-10-06 DIAGNOSIS — N179 Acute kidney failure, unspecified: Secondary | ICD-10-CM | POA: Diagnosis not present

## 2020-10-06 DIAGNOSIS — E109 Type 1 diabetes mellitus without complications: Secondary | ICD-10-CM

## 2020-10-06 DIAGNOSIS — E1165 Type 2 diabetes mellitus with hyperglycemia: Secondary | ICD-10-CM | POA: Insufficient documentation

## 2020-10-06 DIAGNOSIS — E119 Type 2 diabetes mellitus without complications: Secondary | ICD-10-CM | POA: Insufficient documentation

## 2020-10-06 DIAGNOSIS — J9602 Acute respiratory failure with hypercapnia: Secondary | ICD-10-CM

## 2020-10-06 LAB — GLUCOSE, CAPILLARY
Glucose-Capillary: 49 mg/dL — ABNORMAL LOW (ref 70–99)
Glucose-Capillary: 132 mg/dL — ABNORMAL HIGH (ref 70–99)
Glucose-Capillary: 157 mg/dL — ABNORMAL HIGH (ref 70–99)
Glucose-Capillary: 208 mg/dL — ABNORMAL HIGH (ref 70–99)
Glucose-Capillary: 369 mg/dL — ABNORMAL HIGH (ref 70–99)
Glucose-Capillary: 163 mg/dL — ABNORMAL HIGH (ref 70–99)

## 2020-10-06 LAB — COMPREHENSIVE METABOLIC PANEL
ALT: 25 U/L (ref 0–44)
AST: 44 U/L — ABNORMAL HIGH (ref 15–41)
Albumin: 3.5 g/dL (ref 3.5–5.0)
Alkaline Phosphatase: 60 U/L (ref 38–126)
Anion gap: 10 (ref 5–15)
BUN: 7 mg/dL (ref 6–20)
CO2: 32 mmol/L (ref 22–32)
Calcium: 8.4 mg/dL — ABNORMAL LOW (ref 8.9–10.3)
Chloride: 93 mmol/L — ABNORMAL LOW (ref 98–111)
Creatinine, Ser: 0.84 mg/dL (ref 0.61–1.24)
GFR, Estimated: >60 mL/min (ref >60)
Glucose, Bld: 146 mg/dL — ABNORMAL HIGH (ref 70–99)
Potassium: 3.2 mmol/L — ABNORMAL LOW (ref 3.5–5.1)
Sodium: 135 mmol/L (ref 135–145)
Total Bilirubin: 0.7 mg/dL (ref 0.3–1.2)
Total Protein: 5.7 g/dL — ABNORMAL LOW (ref 6.5–8.1)

## 2020-10-06 LAB — HEMOGLOBIN A1C
Hgb A1c MFr Bld: 13.8 % — ABNORMAL HIGH (ref 4.8–5.6)
Mean Plasma Glucose: 349.36 mg/dL

## 2020-10-06 MED ORDER — PANTOPRAZOLE SODIUM 40 MG PO TBEC
40.0000 mg | DELAYED_RELEASE_TABLET | Freq: Two times a day (BID) | ORAL | Status: DC
  Administered 2020-10-06 – 2020-10-07 (×3): 40 mg via ORAL
  Filled 2020-10-06 (×3): qty 1

## 2020-10-06 MED ORDER — TRAZODONE HCL 50 MG PO TABS
50.0000 mg | ORAL_TABLET | Freq: Once | ORAL | Status: AC
  Administered 2020-10-06: 50 mg via ORAL
  Filled 2020-10-06: qty 1

## 2020-10-06 MED ORDER — POTASSIUM CHLORIDE CRYS ER 20 MEQ PO TBCR
40.0000 meq | EXTENDED_RELEASE_TABLET | ORAL | Status: AC
  Administered 2020-10-06 (×2): 40 meq via ORAL
  Filled 2020-10-06 (×2): qty 2

## 2020-10-06 MED ORDER — PANTOPRAZOLE SODIUM 40 MG PO TBEC: 40.0000 mg | DELAYED_RELEASE_TABLET | Freq: Every day | ORAL | Status: DC

## 2020-10-06 MED ORDER — INSULIN ASPART 100 UNIT/ML ~~LOC~~ SOLN
0.0000 [IU] | Freq: Three times a day (TID) | SUBCUTANEOUS | Status: DC
  Administered 2020-10-06: 9 [IU] via SUBCUTANEOUS
  Administered 2020-10-07: 3 [IU] via SUBCUTANEOUS
  Administered 2020-10-07: 5 [IU] via SUBCUTANEOUS

## 2020-10-06 MED ORDER — INSULIN DETEMIR 100 UNIT/ML ~~LOC~~ SOLN
10.0000 [IU] | Freq: Every day | SUBCUTANEOUS | Status: DC
  Filled 2020-10-06: qty 0.1

## 2020-10-06 NOTE — Progress Notes (Signed)
Patient is a new diabetic and current blood glucose  sugar is 46- asymptomatic with level - gave 30 grams of CHO via orange juice with a 3 packets of sugar  to drink - able to follow commands and understood why he needed to drink the juice. Will follow up and re- check in approx 30 mins

## 2020-10-06 NOTE — Progress Notes (Addendum)
PROGRESS NOTE    Tom Anderson  BTD:[id number] DOB: 1971-08-14 DOA: 10/03/2020 PCP: Pcp, No    Brief Narrative:  Tom Anderson was admitted to the hospital with the working diagnosis of DKA, complicated with respiratory failure, required invasive mechanical ventilation.   49 year old male with past medical history for schizophrenia, presented with progressive confusion, lethargy, nausea and vomiting to  ED at [facility].  He was diagnosed with diabetes ketoacidosis and possible sepsis.  He was found in significant respiratory distress, and was placed on invasive mechanical ventilation.  He was transferred to [facility] ICU for ongoing care.  At the time of his transfer his blood pressure was 80/65, heart rate 126, temperature 34.8 C, respiratory rate 12, oxygen saturation 95%.  He had dry mucous membranes, lungs were clear to auscultation bilaterally, heart S1-S2, present, rhythmic, tachycardic, abdomen was soft and nontender, no lower extremity edema, positive muscle wasting.  His venous pH was 6.98, sodium 116, potassium 3.9, chloride 77, bicarb less than 7, glucose 1062, BUN 37, creatinine 2.10, lipase 29, troponin I 116.  White count 30.6, hemoglobin 14.6, hematocrit 41.7, platelets 345.  SARS COVID-19 negative.  Urinalysis specific gravity 1.017, 30 protein, more than 500 glucose, 0-5 white cells.  His chest radiograph had no infiltrates.  EKG 105 bpm, normal axis, normal intervals, sinus rhythm, no ST segment or T wave changes, noisy baseline.  He was placed on intravenous fluids, intravenous insulin and broad-spectrum antibiotic therapy.  He responded well to medical therapy, his anion gap closed, along with correction of kidney function. He was successfully liberated from mechanical ventilation on November 5.  Transferred to [facility] November 7.  Assessment & Plan:   Principal Problem:   DKA (diabetic ketoacidosis) ([place]) Active Problems:   AKI (acute kidney injury) ([place])   Acute respiratory failure  with hypoxia and hypercapnia ([facility])   Acute metabolic encephalopathy   T1DM (type 1 diabetes mellitus) ([place])   Schizophrenia ([place])  Sepsis ruled out   1. Diabetes ketoacidosis. Anion gap this am is 10, fasting glucose is 146, this am had hypoglycemic episode down to 49.   Patient is tolerating po well. Reduce basal insulin to 10 units daily of levemir and decrease insulin sliding scale to sensitive protocol.  Continue close monitoring of capillary glucose.   Increase mobility, out bed to chair tid with meals, PT and OT evaluation. Consult nutrition for evaluation and education.   2. Adult onset T1DM/ uncontrolled. Hgb A1c is 13.8, plan to discharge patient on insulin therapy, will need diabetic teaching and check insurance coverage.  Close outpatient follow up.   3. AKI with hypokalemia. Renal function with serum cr at 0,84 with K down to 3.2 and bicarbonate at 32.  Now off IV fluids and tolerating po well, add 80 meq Kcl in 2 divided doses and follow renal panel in am, including Mg.   4. Acute respiratory failure hypoxic and hypercapnic. Patient liberated from mechanical ventilation 48 H ago.  Oxygenation on room air today is 99%, chest film on admission with no infiltrates.   5. Schizophrenia. No confusion or agitation, follow as outpatient.  Continue with benztropine, fluoxetine, and fluphenazine Discontinue haldol (not used since 11/05)  6. Gastritis and GERD. Increase pantoprazole to bid for now.   7. Moderate calorie protein malnutrition. Continue nutritional supplements, consulted nutrition.   Status is: Inpatient  Remains inpatient appropriate because:Inpatient level of care appropriate due to severity of illness   Dispo: The patient is from: Home  Anticipated d/c is to: Home              Anticipated d/c date is: 1 day              Patient currently is not medically stable to d/c. for possible dc home in am if glucose remains stable.    DVT  prophylaxis: Enoxaparin   Code Status:   full  Family Communication:  I spoke with patient's mother at the bedside, we talked in detail about patient's condition, plan of care and prognosis and all questions were addressed.      Nutrition Status: Nutrition Problem: Inadequate oral intake Etiology: acute illness Signs/Symptoms: other (comment) (per chart review)       Subjective: Patient is awake and alert, no nausea or vomiting, no chest pain or dyspnea. Positive mid abdominal pain, moderate in intensity. No radiation, no worsening or improving factors.   Objective: Vitals:   10/05/20 1704 10/05/20 2054 10/06/20 0107 10/06/20 0521  BP: 127/89 111/86 112/85 112/85  Pulse: (!) 102 93 (!) 103 90  Resp: 17 16 16 18   Temp: 98.4 F (36.9 C) 98.2 F (36.8 C) 98.1 F (36.7 C) 97.7 F (36.5 C)  TempSrc: Oral Oral Oral Oral  SpO2: 100% 97% 97% 99%  Weight:      Height:        Intake/Output Summary (Last 24 hours) at 10/06/2020 1320 Last data filed at 10/05/2020 2242 Gross per 24 hour  Intake 241.05 ml  Output 200 ml  Net 41.05 ml   Filed Weights   10/04/20 0100 10/04/20 0420 10/05/20 1149  Weight: 73.9 kg 56.1 kg 59.1 kg    Examination:   General: Not in pain or dyspnea. Deconditioned  Neurology: Awake and alert, non focal  E ENT: mild pallor, no icterus, oral mucosa moist Cardiovascular: No JVD. S1-S2 present. No lower extremity edema. Pulmonary: positive breath sounds bilaterally, with no wheezing, rhonchi or rales. Gastrointestinal. Abdomen soft and non tender Skin. No rashes Musculoskeletal: no joint deformities     Data Reviewed: I have personally reviewed following labs and imaging studies  CBC: Recent Labs  Lab 10/03/20 2209 10/03/20 2214 10/03/20 2311  WBC 30.6*  --   --   NEUTROABS 26.2*  --   --   HGB 14.6 16.3 12.6*  HCT 41.7 48.0 37.0*  MCV 90.8  --   --   PLT 345  --   --    Basic Metabolic Panel: Recent Labs  Lab 10/04/20 0940  10/04/20 1347 10/04/20 1834 10/05/20 0837 10/06/20 0726  NA 138 139 138 135 135  K 2.9* 2.9* 2.6* 3.0* 3.2*  CL 98 99 97* 97* 93*  CO2 25 23 29 24  32  GLUCOSE 186* 178* 139* 80 146*  BUN 29* 25* 22* 10 7  CREATININE 1.04 1.05 0.94 0.77 0.84  CALCIUM 8.5* 8.5* 8.7* 8.4* 8.4*   GFR: Estimated Creatinine Clearance: 90.9 mL/min (by C-G formula based on SCr of 0.84 mg/dL). Liver Function Tests: Recent Labs  Lab 10/03/20 2209 10/05/20 0837 10/06/20 0726  AST 25 61* 44*  ALT 22 26 25   ALKPHOS 104 59 60  BILITOT 1.3* 0.7 0.7  PROT 7.6 5.4* 5.7*  ALBUMIN 4.8 3.4* 3.5   Recent Labs  Lab 10/03/20 2209  LIPASE 29   No results for input(s): AMMONIA in the last 168 hours. Coagulation Profile: No results for input(s): INR, PROTIME in the last 168 hours. Cardiac Enzymes: No results for input(s): CKTOTAL, CKMB, CKMBINDEX,  TROPONINI in the last 168 hours. BNP (last 3 results) No results for input(s): PROBNP in the last 8760 hours. HbA1C: Recent Labs    10/04/20 0237 10/06/20 0726  HGBA1C 13.4* 13.8*   CBG: Recent Labs  Lab 10/05/20 2335 10/06/20 0523 10/06/20 0630 10/06/20 0737 10/06/20 1143  GLUCAP 105* 49* 132* 157* 208*   Lipid Profile: Recent Labs    10/04/20 0237  TRIG 649*   Thyroid Function Tests: No results for input(s): TSH, T4TOTAL, FREET4, T3FREE, THYROIDAB in the last 72 hours. Anemia Panel: No results for input(s): VITAMINB12, FOLATE, FERRITIN, TIBC, IRON, RETICCTPCT in the last 72 hours.    Radiology Studies: I have reviewed all of the imaging during this hospital visit personally     Scheduled Meds: . benztropine  1 mg Oral Daily  . Chlorhexidine Gluconate Cloth  6 each Topical Daily  . feeding supplement (NEPRO CARB STEADY)  237 mL Oral TID BM  . FLUoxetine  40 mg Oral Daily  . fluPHENAZine  5 mg Oral QHS  . heparin injection (subcutaneous)  5,000 Units Subcutaneous Q8H  . insulin aspart  0-15 Units Subcutaneous TID WC  . insulin  aspart  0-5 Units Subcutaneous QHS  . insulin detemir  10 Units Subcutaneous BID  . multivitamin with minerals  1 tablet Oral Daily  . pantoprazole  40 mg Oral QHS   Continuous Infusions:   LOS: 2 days        Shailynn Fong Gerome Apley, MD

## 2020-10-07 DIAGNOSIS — E440 Moderate protein-calorie malnutrition: Secondary | ICD-10-CM | POA: Insufficient documentation

## 2020-10-07 DIAGNOSIS — G9341 Metabolic encephalopathy: Secondary | ICD-10-CM | POA: Diagnosis not present

## 2020-10-07 DIAGNOSIS — N179 Acute kidney failure, unspecified: Secondary | ICD-10-CM | POA: Diagnosis not present

## 2020-10-07 DIAGNOSIS — E1111 Type 2 diabetes mellitus with ketoacidosis with coma: Secondary | ICD-10-CM | POA: Diagnosis not present

## 2020-10-07 DIAGNOSIS — J9601 Acute respiratory failure with hypoxia: Secondary | ICD-10-CM | POA: Diagnosis not present

## 2020-10-07 LAB — TYPE AND SCREEN
ABO/RH(D): A POS
Antibody Screen: NEGATIVE
Unit division: 0

## 2020-10-07 LAB — GLUCOSE, CAPILLARY
Glucose-Capillary: 182 mg/dL — ABNORMAL HIGH (ref 70–99)
Glucose-Capillary: 345 mg/dL — ABNORMAL HIGH (ref 70–99)
Glucose-Capillary: 229 mg/dL — ABNORMAL HIGH (ref 70–99)
Glucose-Capillary: 292 mg/dL — ABNORMAL HIGH (ref 70–99)

## 2020-10-07 LAB — BASIC METABOLIC PANEL
Anion gap: 6 (ref 5–15)
BUN: 11 mg/dL (ref 6–20)
CO2: 31 mmol/L (ref 22–32)
Calcium: 8.5 mg/dL — ABNORMAL LOW (ref 8.9–10.3)
Chloride: 97 mmol/L — ABNORMAL LOW (ref 98–111)
Creatinine, Ser: 0.79 mg/dL (ref 0.61–1.24)
GFR, Estimated: >60 mL/min (ref >60)
Glucose, Bld: 188 mg/dL — ABNORMAL HIGH (ref 70–99)
Potassium: 3.5 mmol/L (ref 3.5–5.1)
Sodium: 134 mmol/L — ABNORMAL LOW (ref 135–145)

## 2020-10-07 LAB — C-PEPTIDE: C-Peptide: 0.2 ng/mL — ABNORMAL LOW (ref 1.1–4.4)

## 2020-10-07 LAB — MAGNESIUM: Magnesium: 1.8 mg/dL (ref 1.7–2.4)

## 2020-10-07 MED ORDER — INSULIN ASPART 100 UNIT/ML ~~LOC~~ SOLN
7.0000 [IU] | Freq: Once | SUBCUTANEOUS | Status: AC
  Administered 2020-10-07: 7 [IU] via SUBCUTANEOUS

## 2020-10-07 MED ORDER — INSULIN DETEMIR 100 UNIT/ML FLEXPEN: 15.0000 [IU] | PEN_INJECTOR | Freq: Every day | SUBCUTANEOUS | 0 refills | Status: DC

## 2020-10-07 MED ORDER — MAGNESIUM SULFATE 2 GM/50ML IV SOLN
2.0000 g | Freq: Once | INTRAVENOUS | Status: AC
  Administered 2020-10-07: 2 g via INTRAVENOUS
  Filled 2020-10-07: qty 50

## 2020-10-07 MED ORDER — PANTOPRAZOLE SODIUM 40 MG PO TBEC: 40.0000 mg | DELAYED_RELEASE_TABLET | Freq: Every day | ORAL | 0 refills | Status: DC

## 2020-10-07 MED ORDER — INSULIN ASPART 100 UNIT/ML FLEXPEN: PEN_INJECTOR | SUBCUTANEOUS | 11 refills | Status: DC

## 2020-10-07 MED ORDER — INSULIN SYRINGES (DISPOSABLE) U-100 0.3 ML MISC: 0 refills | Status: DC

## 2020-10-07 MED ORDER — PANTOPRAZOLE SODIUM 40 MG PO TBEC: 40.0000 mg | DELAYED_RELEASE_TABLET | Freq: Two times a day (BID) | ORAL | 0 refills | Status: DC

## 2020-10-07 MED ORDER — BLOOD GLUCOSE METER KIT: PACK | 0 refills | Status: DC

## 2020-10-07 MED ORDER — POTASSIUM CHLORIDE CRYS ER 20 MEQ PO TBCR
40.0000 meq | EXTENDED_RELEASE_TABLET | Freq: Once | ORAL | Status: AC
  Administered 2020-10-07: 40 meq via ORAL
  Filled 2020-10-07: qty 2

## 2020-10-07 MED ORDER — INSULIN DETEMIR 100 UNIT/ML ~~LOC~~ SOLN
15.0000 [IU] | Freq: Every day | SUBCUTANEOUS | Status: DC
  Administered 2020-10-07: 15 [IU] via SUBCUTANEOUS
  Filled 2020-10-07: qty 0.15

## 2020-10-07 NOTE — Progress Notes (Signed)
Pt discharged home with mother.  Insulin directions taught at [facility] with mother.  Mother demonstrated understanding with insulin administration.  PIV discontinued.  Pt chose to ambulate out with mother at discharge.

## 2020-10-07 NOTE — Progress Notes (Signed)
Nutrition Follow-up  DOCUMENTATION CODES:   Non-severe (moderate) malnutrition in context of chronic illness  INTERVENTION:   -Provided DM diet education and "Carbohydrate Counting" handout -Nepro Shake po TID, each supplement provides 425 kcal and 19 grams protein -Reviewed protein shake options with patient  NUTRITION DIAGNOSIS:   Moderate Malnutrition related to chronic illness (diabetes) as evidenced by energy intake < or equal to 75% for > or equal to 1 month, moderate fat depletion, severe muscle depletion.  Improving  GOAL:   Patient will meet greater than or equal to 90% of their needs  Progressing.  MONITOR:   PO intake, Supplement acceptance, Labs, Weight trends, Skin, I & O's  REASON FOR ASSESSMENT:   Consult Assessment of nutrition requirement/status, Diet education  ASSESSMENT:   49 y/o male with h/o schizophrenia who is admitted with DKA  11/4-11/5-intubated d/t DKA  Patient in room, mother not at bedside. Pt reports his mother had questions regarding insulin.   Pt consumed 75% of breakfast this morning. States the Nepro is "okay". Reviewed some other protein options for pt to try once discharged.  RD provided "Carbohydrate Counting for People with Diabetes" handout from the Academy of [employer] Dietetics. Discussed different food groups and their effects on blood sugar, emphasizing carbohydrate-containing foods. Provided list of carbohydrates and recommended serving sizes of common foods.  Discussed importance of controlled and consistent carbohydrate intake throughout the day. Provided examples of ways to balance meals/snacks and encouraged intake of high-fiber, whole grain complex carbohydrates. Teach back method used.  Expect fair compliance. Needs some assistance from family. Pt did identify that he needs to drink sugar-free beverages. Pt was drinking a lot of soda and juice PTA.  Admission weight: 123 lbs. Current weight: 130  lbs.  Medications: Multivitamin with minerals daily, KLOR-CON  Labs reviewed: CBGs: 229-292 Low Na  NUTRITION - FOCUSED PHYSICAL EXAM:    Most Recent Value  Orbital Region Mild depletion  Upper Arm Region Moderate depletion  Thoracic and Lumbar Region Unable to assess  Buccal Region Moderate depletion  Temple Region Moderate depletion  Clavicle Bone Region Severe depletion  Clavicle and Acromion Bone Region Moderate depletion  Scapular Bone Region Severe depletion  Dorsal Hand Moderate depletion  Patellar Region Unable to assess  [in jeans, discharging today]  Anterior Thigh Region Unable to assess  Posterior Calf Region Unable to assess  Edema (RD Assessment) None  Mouth --  [missing]       Diet Order:   Diet Order            Diet - low sodium heart healthy           Diet Carb Modified Fluid consistency: Thin; Room service appropriate? Yes  Diet effective now                 EDUCATION NEEDS:   Not appropriate for education at this time  Skin:  Skin Assessment: Reviewed RN Assessment (petechiae)  Last BM:  PTA  Height:   Ht Readings from Last 1 Encounters:  10/05/20 5' 10"  (1.778 m)    Weight:   Wt Readings from Last 1 Encounters:  10/05/20 59.1 kg   BMI:  Body mass index is 18.7 kg/m.  Estimated Nutritional Needs:   Kcal:  1900-2200kcal/day  Protein:  95-110g/day  Fluid:  1.8-2.1L/day   Clayton Bibles, MS, RD, LDN Inpatient Clinical Dietitian Contact information available via Amion

## 2020-10-07 NOTE — Evaluation (Signed)
Occupational Therapy Evaluation and Discharge Patient Details Name: Tom Anderson MRN: [id number] DOB: Jun 06, 1971 Today's Date: 10/07/2020    History of Present Illness 49 year old male with past medical history for schizophrenia, presented with progressive confusion, lethargy, nausea and vomiting to  ED at [facility].  He was diagnosed with diabetes ketoacidosis (new T1DM diagnosis).  He was found in significant respiratory distress, and was placed on invasive mechanical ventilation (11/4-11/5).  He was transferred to [facility] ICU for ongoing care.   Clinical Impression   This 49 yo male admitted with above presents to acute OT with PLOF of being totally independent with basic ADLs. IADLs, and mobility and currently is back to his PLOF with ADLs and mobility. No further OT needs, well we D/C and have made PT aware no PT needs identified.     Follow Up Recommendations  No OT follow up    Equipment Recommendations  Tub/shower seat       Precautions / Restrictions Precautions Precautions: None Restrictions Weight Bearing Restrictions: No      Mobility Bed Mobility Overal bed mobility: Independent                  Transfers                 General transfer comment: Independent with basic mobility and ambulation with head turns as well as picking an item up off the floor. Mod I with flight of steps (used both hand rails)    Balance Overall balance assessment: Independent                                         ADL either performed or assessed with clinical judgement   ADL Overall ADL's : Independent                                             Vision Patient Visual Report: No change from baseline              Pertinent Vitals/Pain Pain Assessment: No/denies pain     Hand Dominance Right   Extremity/Trunk Assessment Upper Extremity Assessment Upper Extremity Assessment: Overall WFL for tasks assessed            Communication Communication Communication: No difficulties   Cognition Arousal/Alertness: Awake/alert Behavior During Therapy: WFL for tasks assessed/performed Overall Cognitive Status: Within Functional Limits for tasks assessed                                                Home Living Family/patient expects to be discharged to:: Private residence Living Arrangements: Parent Available Help at Discharge: Family;Available 24 hours/day Type of Home: House Home Access: Ramped entrance     Home Layout: Two level;Able to live on main level with bedroom/bathroom Alternate Level Stairs-Number of Steps: 10-12 Alternate Level Stairs-Rails: Right;Left;Can reach both Bathroom Shower/Tub: Occupational psychologist: Standard     Home Equipment: None          Prior Functioning/Environment Level of Independence: Independent  OT Goals(Current goals can be found in the care plan section) Acute Rehab OT Goals Patient Stated Goal: to go home today  OT Frequency:                AM-PAC OT "6 Clicks" Daily Activity     Outcome Measure Help from another person eating meals?: None Help from another person taking care of personal grooming?: None Help from another person toileting, which includes using toliet, bedpan, or urinal?: None Help from another person bathing (including washing, rinsing, drying)?: None Help from another person to put on and taking off regular upper body clothing?: None Help from another person to put on and taking off regular lower body clothing?: None 6 Click Score: 24   End of Session Equipment Utilized During Treatment: Gait belt  Activity Tolerance: Patient tolerated treatment well Patient left: in bed  OT Visit Diagnosis: Muscle weakness (generalized) (M62.81)                Time: 2992-4268 OT Time Calculation (min): 10 min Charges:  OT General Charges $OT Visit: 1 Visit OT Evaluation $OT  Eval Low Complexity: 1 Low  [address], OTR/L Acute [employer] Pager [phone] Office [phone]     Almon Register 10/07/2020, 11:31 AM

## 2020-10-07 NOTE — Discharge Summary (Addendum)
Physician Discharge Summary  Mat Stuard ZOX:[id number] DOB: 05-Apr-1971 DOA: 10/03/2020  PCP: Pcp, No  Admit date: 10/03/2020 Discharge date: 10/07/2020  Admitted From: Home  Disposition:  Home   Recommendations for Outpatient Follow-up and new medication changes:  1. Follow up with Primary Care in 7 days.  2. Patient will continue insulin therapy at discharge.  3. Started on Pantoprazole for GERD  Home Health: no   Equipment/Devices: no    Discharge Condition: stable  CODE STATUS: full  Diet recommendation: diabetic prudent.   Brief/Interim Summary: Mr. Alvizo was admitted to the hospital with the working diagnosis of DKA, complicated with respiratory failure, required invasive mechanical ventilation.   49 year old male with past medical history for schizophrenia, presented with progressive confusion, lethargy, nausea and vomiting to ED at [facility].  He was diagnosed with diabetes ketoacidosis and possible sepsis.  He was found in significant respiratory distress, and was placed on invasive mechanical ventilation.  He was transferred to [facility] ICU for ongoing care.  At the time of his transfer his blood pressure was 80/65, heart rate 126, temperature 34.8 C, respiratory rate 12, oxygen saturation 95%.  He had dry mucous membranes, lungs were clear to auscultation bilaterally, heart S1-S2, present, rhythmic, tachycardic, abdomen was soft and nontender, no lower extremity edema, positive muscle wasting.  His venous pH was 6.98, sodium 116, potassium 3.9, chloride 77, bicarb less than 7, glucose 1062, BUN 37, creatinine 2.10, anion gap of 34, lipase 29, troponin I 116.  White count 30.6, hemoglobin 14.6, hematocrit 41.7, platelets 345.  SARS COVID-19 negative.  Urinalysis specific gravity 1.017, 30 protein, more than 500 glucose, 0-5 white cells.  His chest radiograph had no infiltrates.  EKG 105 bpm, normal axis, normal intervals, sinus rhythm, no ST segment or T wave changes, noisy  baseline.  He was placed on intravenous fluids, intravenous insulin and broad-spectrum antibiotic therapy.  He responded well to medical therapy, his anion gap closed, along with correction of kidney function. He was successfully liberated from mechanical ventilation on November 5.  Transferred to [facility] November 7.  1. Diabetes ketoacidosis.  Patient tolerated well medical therapy with intravenous fluids, insulin therapy and electrolyte correction. He has been successfully transitioned to subcutaneous insulin with good toleration.  Currently he has no nausea, or vomiting, tolerating p.o. diet adequately.  His discharge fasting glucose is 188, and anion gap of 6.  2.  Adult onset type 1 diabetes mellitus, uncontrolled.  His hemoglobin A1c is 13.8.  Patient will receive diabetic teaching, continue insulin therapy at home.  15 units of Levemir along with insulin sliding scale.  Further adjustments as an outpatient.  3.  Acute kidney injury with hypokalemia/hypomagnesemia.  Patient received intravenous fluids and supportive medical care, his kidney function and electrolytes were corrected. Her corrected sodium for hyperglycemia on admission was 139. At discharge sodium 134, potassium 3.5, chloride 97, bicarb 31, glucose 188, BUN 11, creatinine 0.79, magnesium 1.8. He will get 40 mg potassium chloride and 2 g magnesium sulfate before discharge.  4.  Acute respiratory failure, hypoxic and hypercapnic, likely related to acute metabolic encephalopathy related to hyperosmolar state.  Patient was placed on invasive mechanical ventilation with good toleration. He remained on mechanical ventilation for 24 hours.  At discharge his oxygenation is 100% on room air.  5.  Schizophrenia.  No significant confusion or agitation, continue fluoxetine, and fluphenazine.  6.  Moderate calorie protein malnutrition.  He received nutritional supplements.  Discharge Diagnoses:  Principal Problem:   DKA  (  diabetic ketoacidosis) ([place]) Active Problems:   AKI (acute kidney injury) ([place])   Acute respiratory failure with hypoxia and hypercapnia (H[facility])   Acute metabolic encephalopathy   T1DM (type 1 diabetes mellitus) ([place])   Schizophrenia ([place])   Malnutrition of moderate degree    Discharge Instructions   Allergies as of 10/07/2020      Reactions   Nitrous Oxide Nausea And Vomiting      Medication List    STOP taking these medications   meloxicam 15 MG tablet Commonly known as: Mobic   metaxalone 800 MG tablet Commonly known as: SKELAXIN     TAKE these medications   blood glucose meter kit and supplies Dispense based on patient and insurance preference. Use up to four times daily as directed. (FOR ICD-10 E10.9, E11.9).   FLUoxetine 40 MG capsule Commonly known as: PROZAC Take 40 mg by mouth daily.   fluPHENAZine 10 MG tablet Commonly known as: PROLIXIN Take 5 mg by mouth at bedtime.   ibuprofen 200 MG tablet Commonly known as: ADVIL Take 200-1,000 mg by mouth every 6 (six) hours as needed for fever or moderate pain.   insulin aspart 100 UNIT/ML FlexPen Commonly known as: NOVOLOG For glucose 121 to 150 use 1 unit, for 151 to 200 use 2 units, for 201 to 250 use 3 units, for 251 to 300 use 5 units, for 301 to 350 use 7 units for 351 or greater use 9 units.   insulin detemir 100 UNIT/ML FlexPen Commonly known as: LEVEMIR Inject 15 Units into the skin daily.   Insulin Syringes (Disposable) U-100 0.3 ML Misc For insulin use.   OVER THE COUNTER MEDICATION Take 1 tablet by mouth daily. Super Beta Prostate supplement   pantoprazole 40 MG tablet Commonly known as: PROTONIX Take 1 tablet (40 mg total) by mouth daily.            Durable Medical Equipment  (From admission, onward)         Start     Ordered   10/07/20 1151  For home use only DME Shower stool  Once        10/07/20 1150          Allergies  Allergen Reactions   Nitrous Oxide Nausea And  Vomiting      Procedures/Studies: DG Chest 1 View  Result Date: 10/04/2020 CLINICAL DATA:  Intubation. EXAM: CHEST  1 VIEW COMPARISON:  10/03/2020. FINDINGS: Endotracheal tube and NG tube in stable position. Heart size normal. Mild left perihilar subsegmental atelectasis. No focal infiltrate. No pleural effusion or pneumothorax. IMPRESSION: 1. Lines and tubes in stable position. 2. Mild left perihilar subsegmental atelectasis. Electronically Signed   By: Marcello Moores  Register   On: 10/04/2020 06:36   DG Abdomen 1 View  Result Date: 10/03/2020 CLINICAL DATA:  Abdominal pain and vomiting EXAM: ABDOMEN - 1 VIEW COMPARISON:  None. FINDINGS: Gastric catheter extends into the stomach. Scattered large and small bowel gas is noted. No obstructive changes are seen. Mild retained fecal material is noted within the right colon. No acute bony abnormality is seen. IMPRESSION: Mild retained fecal material. Gastric catheter within the stomach. No obstructive change is seen. Electronically Signed   By: Inez Catalina M.D.   On: 10/03/2020 23:51   DG Chest Port 1 View  Result Date: 10/03/2020 CLINICAL DATA:  Nausea and vomiting with decreased appetite and weight loss, initial encounter EXAM: PORTABLE CHEST 1 VIEW COMPARISON:  08/27/2011 FINDINGS: Endotracheal tube is noted 7.5 cm above  the carina. Gastric catheter extends into the stomach. Left jugular central line is noted extending towards the left subclavian vein. Cardiac shadow is within normal limits. The lungs are well aerated bilaterally. No focal infiltrate or effusion is seen. No acute bony abnormality is noted. No pneumothorax is noted. IMPRESSION: Tubes and lines as described above. No acute abnormality noted. Electronically Signed   By: Inez Catalina M.D.   On: 10/03/2020 23:42       Subjective: Patient is feeling well, no nausea or vomiting, no dyspnea or chest pain, he has been out of bed.   Discharge Exam: Vitals:   10/06/20 2045 10/07/20 0538  BP:  (!) 120/97 109/88  Pulse: 90 87  Resp: 18 17  Temp: 98.5 F (36.9 C) 98 F (36.7 C)  SpO2: 99% 100%   Vitals:   10/06/20 0521 10/06/20 1403 10/06/20 2045 10/07/20 0538  BP: 112/85 109/70 (!) 120/97 109/88  Pulse: 90 96 90 87  Resp: 18 18 18 17   Temp: 97.7 F (36.5 C) 99.1 F (37.3 C) 98.5 F (36.9 C) 98 F (36.7 C)  TempSrc: Oral Oral Oral Oral  SpO2: 99% 100% 99% 100%  Weight:      Height:        General: Not in pain or dyspnea.  Neurology: Awake and alert, non focal  E ENT: no pallor, no icterus, oral mucosa moist Cardiovascular: No JVD. S1-S2 present, rhythmic, no gallops, rubs, or murmurs. No lower extremity edema. Pulmonary: positive breath sounds bilaterally,  Gastrointestinal. Abdomen soft and non tender Skin. No rashes Musculoskeletal: no joint deformities   The results of significant diagnostics from this hospitalization (including imaging, microbiology, ancillary and laboratory) are listed below for reference.     Microbiology: Recent Results (from the past 240 hour(s))  Blood culture (routine x 2)     Status: None (Preliminary result)   Collection Time: 10/03/20 10:02 PM   Specimen: BLOOD  Result Value Ref Range Status   Specimen Description   Final    BLOOD LEFT ANTECUBITAL Performed at [facility], [address] [place], [place] [address]    Special Requests   Final    BOTTLES DRAWN AEROBIC AND ANAEROBIC Blood Culture adequate volume Performed at [facility], [address] [place], [place] [address]    Culture   Final    NO GROWTH 3 DAYS Performed at [facility], [place] [address] [place], [place] [address]    Report Status PENDING  Incomplete  Blood culture (routine x 2)     Status: None (Preliminary result)   Collection Time: 10/03/20 10:06 PM   Specimen: BLOOD  Result Value Ref Range Status   Specimen Description   Final    BLOOD RIGHT ANTECUBITAL Performed at [facility], [address]  [place], [place] [address]    Special Requests   Final    BOTTLES DRAWN AEROBIC AND ANAEROBIC Blood Culture adequate volume Performed at [facility], [address] [place], [place] [address]    Culture   Final    NO GROWTH 3 DAYS Performed at [facility], [place] [address], [place], [place] [address]    Report Status PENDING  Incomplete  Respiratory Panel by RT PCR (Flu A&B, Covid) - Nasopharyngeal Swab     Status: None   Collection Time: 10/03/20 10:51 PM   Specimen: Nasopharyngeal Swab  Result Value Ref Range Status   SARS  Coronavirus 2 by RT PCR NEGATIVE NEGATIVE Final    Comment: (NOTE) SARS-CoV-2 target nucleic acids are NOT DETECTED.  The SARS-CoV-2 RNA is generally detectable in upper respiratoy specimens during the acute phase of infection. The lowest concentration of SARS-CoV-2 viral copies this assay can detect is 131 copies/mL. A negative result does not preclude SARS-Cov-2 infection and should not be used as the sole basis for treatment or other patient management decisions. A negative result may occur with  improper specimen collection/handling, submission of specimen other than nasopharyngeal swab, presence of viral mutation(s) within the areas targeted by this assay, and inadequate number of viral copies (<131 copies/mL). A negative result must be combined with clinical observations, patient history, and epidemiological information. The expected result is Negative.  Fact Sheet for Patients:  PinkCheek.be  Fact Sheet for Healthcare Providers:  GravelBags.it  This test is no t yet approved or cleared by the [place] FDA and  has been authorized for detection and/or diagnosis of SARS-CoV-2 by FDA under an Emergency Use Authorization (EUA). This EUA will remain  in effect (meaning this test can be used) for the duration of the COVID-19 declaration under Section 564(b)(1) of the Act, 21  U.S.C. section 360bbb-3(b)(1), unless the authorization is terminated or revoked sooner.     Influenza A by PCR NEGATIVE NEGATIVE Final   Influenza B by PCR NEGATIVE NEGATIVE Final    Comment: (NOTE) The Xpert Xpress SARS-CoV-2/FLU/RSV assay is intended as an aid in  the diagnosis of influenza from Nasopharyngeal swab specimens and  should not be used as a sole basis for treatment. Nasal washings and  aspirates are unacceptable for Xpert Xpress SARS-CoV-2/FLU/RSV  testing.  Fact Sheet for Patients: PinkCheek.be  Fact Sheet for Healthcare Providers: GravelBags.it  This test is not yet approved or cleared by the [place] FDA and  has been authorized for detection and/or diagnosis of SARS-CoV-2 by  FDA under an Emergency Use Authorization (EUA). This EUA will remain  in effect (meaning this test can be used) for the duration of the  Covid-19 declaration under Section 564(b)(1) of the Act, 21  U.S.C. section 360bbb-3(b)(1), unless the authorization is  terminated or revoked. Performed at [facility], [address] [place], [place] [address]   MRSA PCR Screening     Status: None   Collection Time: 10/04/20  1:38 AM   Specimen: Nasopharyngeal  Result Value Ref Range Status   MRSA by PCR NEGATIVE NEGATIVE Final    Comment:        The GeneXpert MRSA Assay (FDA approved for NASAL specimens only), is one component of a comprehensive MRSA colonization surveillance program. It is not intended to diagnose MRSA infection nor to guide or monitor treatment for MRSA infections. Performed at [facility], [place] [address] [place], [place] [address]      Labs: BNP (last 3 results) No results for input(s): BNP in the last 8760 hours. Basic Metabolic Panel: Recent Labs  Lab 10/04/20 1347 10/04/20 1834 10/05/20 0837 10/06/20 0726 10/07/20 0604  NA 139 138 135 135 134*  K 2.9* 2.6* 3.0*  3.2* 3.5  CL 99 97* 97* 93* 97*  CO2 23 29 24  32 31  GLUCOSE 178* 139* 80 146* 188*  BUN 25* 22* 10 7 11   CREATININE 1.05 0.94 0.77 0.84 0.79  CALCIUM 8.5* 8.7* 8.4* 8.4* 8.5*  MG  --   --   --   --  1.8   Liver Function  Tests: Recent Labs  Lab 10/03/20 2209 10/05/20 0837 10/06/20 0726  AST 25 61* 44*  ALT 22 26 25   ALKPHOS 104 59 60  BILITOT 1.3* 0.7 0.7  PROT 7.6 5.4* 5.7*  ALBUMIN 4.8 3.4* 3.5   Recent Labs  Lab 10/03/20 2209  LIPASE 29   No results for input(s): AMMONIA in the last 168 hours. CBC: Recent Labs  Lab 10/03/20 2209 10/03/20 2214 10/03/20 2311  WBC 30.6*  --   --   NEUTROABS 26.2*  --   --   HGB 14.6 16.3 12.6*  HCT 41.7 48.0 37.0*  MCV 90.8  --   --   PLT 345  --   --    Cardiac Enzymes: No results for input(s): CKTOTAL, CKMB, CKMBINDEX, TROPONINI in the last 168 hours. BNP: Invalid input(s): POCBNP CBG: Recent Labs  Lab 10/06/20 1600 10/06/20 1939 10/06/20 2350 10/07/20 0404 10/07/20 0739  GLUCAP 369* 163* 345* 182* 229*   D-Dimer No results for input(s): DDIMER in the last 72 hours. Hgb A1c Recent Labs    10/06/20 0726  HGBA1C 13.8*   Lipid Profile No results for input(s): CHOL, HDL, LDLCALC, TRIG, CHOLHDL, LDLDIRECT in the last 72 hours. Thyroid function studies No results for input(s): TSH, T4TOTAL, T3FREE, THYROIDAB in the last 72 hours.  Invalid input(s): FREET3 Anemia work up No results for input(s): VITAMINB12, FOLATE, FERRITIN, TIBC, IRON, RETICCTPCT in the last 72 hours. Urinalysis    Component Value Date/Time   COLORURINE YELLOW 10/04/2020 1823   APPEARANCEUR CLEAR 10/04/2020 1823   LABSPEC 1.017 10/04/2020 1823   PHURINE 6.0 10/04/2020 1823   GLUCOSEU >=500 (A) 10/04/2020 1823   HGBUR MODERATE (A) 10/04/2020 1823   BILIRUBINUR NEGATIVE 10/04/2020 1823   KETONESUR 80 (A) 10/04/2020 1823   PROTEINUR 30 (A) 10/04/2020 1823   NITRITE NEGATIVE 10/04/2020 1823   LEUKOCYTESUR NEGATIVE 10/04/2020 1823   Sepsis  Labs Invalid input(s): PROCALCITONIN,  WBC,  LACTICIDVEN Microbiology Recent Results (from the past 240 hour(s))  Blood culture (routine x 2)     Status: None (Preliminary result)   Collection Time: 10/03/20 10:02 PM   Specimen: BLOOD  Result Value Ref Range Status   Specimen Description   Final    BLOOD LEFT ANTECUBITAL Performed at [facility], [address] [place], [place] [address]    Special Requests   Final    BOTTLES DRAWN AEROBIC AND ANAEROBIC Blood Culture adequate volume Performed at [facility], [address] [place], [place] [address]    Culture   Final    NO GROWTH 3 DAYS Performed at [facility], [place] [address] [place], [place] [address]    Report Status PENDING  Incomplete  Blood culture (routine x 2)     Status: None (Preliminary result)   Collection Time: 10/03/20 10:06 PM   Specimen: BLOOD  Result Value Ref Range Status   Specimen Description   Final    BLOOD RIGHT ANTECUBITAL Performed at [facility], [address] [place], [place] [address]    Special Requests   Final    BOTTLES DRAWN AEROBIC AND ANAEROBIC Blood Culture adequate volume Performed at [facility], [address] [place], [place] [address]    Culture   Final    NO GROWTH 3 DAYS Performed at [facility], [place] [address] [place], [place] [address]    Report Status PENDING  Incomplete  Respiratory Panel  by RT PCR (Flu A&B, Covid) - Nasopharyngeal Swab     Status: None   Collection Time: 10/03/20 10:51 PM   Specimen: Nasopharyngeal Swab  Result Value Ref Range Status   SARS Coronavirus 2 by RT PCR NEGATIVE NEGATIVE Final    Comment: (NOTE) SARS-CoV-2 target nucleic acids are NOT DETECTED.  The SARS-CoV-2 RNA is generally detectable in upper respiratoy specimens during the acute phase of infection. The lowest concentration of SARS-CoV-2 viral copies this assay can detect is 131 copies/mL. A negative result does not  preclude SARS-Cov-2 infection and should not be used as the sole basis for treatment or other patient management decisions. A negative result may occur with  improper specimen collection/handling, submission of specimen other than nasopharyngeal swab, presence of viral mutation(s) within the areas targeted by this assay, and inadequate number of viral copies (<131 copies/mL). A negative result must be combined with clinical observations, patient history, and epidemiological information. The expected result is Negative.  Fact Sheet for Patients:  PinkCheek.be  Fact Sheet for Healthcare Providers:  GravelBags.it  This test is no t yet approved or cleared by the [place] FDA and  has been authorized for detection and/or diagnosis of SARS-CoV-2 by FDA under an Emergency Use Authorization (EUA). This EUA will remain  in effect (meaning this test can be used) for the duration of the COVID-19 declaration under Section 564(b)(1) of the Act, 21 U.S.C. section 360bbb-3(b)(1), unless the authorization is terminated or revoked sooner.     Influenza A by PCR NEGATIVE NEGATIVE Final   Influenza B by PCR NEGATIVE NEGATIVE Final    Comment: (NOTE) The Xpert Xpress SARS-CoV-2/FLU/RSV assay is intended as an aid in  the diagnosis of influenza from Nasopharyngeal swab specimens and  should not be used as a sole basis for treatment. Nasal washings and  aspirates are unacceptable for Xpert Xpress SARS-CoV-2/FLU/RSV  testing.  Fact Sheet for Patients: PinkCheek.be  Fact Sheet for Healthcare Providers: GravelBags.it  This test is not yet approved or cleared by the [place] FDA and  has been authorized for detection and/or diagnosis of SARS-CoV-2 by  FDA under an Emergency Use Authorization (EUA). This EUA will remain  in effect (meaning this test can be used) for the  duration of the  Covid-19 declaration under Section 564(b)(1) of the Act, 21  U.S.C. section 360bbb-3(b)(1), unless the authorization is  terminated or revoked. Performed at [facility], [address] [place], [place] [address]   MRSA PCR Screening     Status: None   Collection Time: 10/04/20  1:38 AM   Specimen: Nasopharyngeal  Result Value Ref Range Status   MRSA by PCR NEGATIVE NEGATIVE Final    Comment:        The GeneXpert MRSA Assay (FDA approved for NASAL specimens only), is one component of a comprehensive MRSA colonization surveillance program. It is not intended to diagnose MRSA infection nor to guide or monitor treatment for MRSA infections. Performed at [facility], [place] [address] [place], [place] [address]      Time coordinating discharge: 45 minutes  SIGNED:   Tawni Millers, MD  [facility] 10/07/2020, 8:58 AM

## 2020-10-07 NOTE — Progress Notes (Signed)
Pt is up to walk with independence and does not appear to need PT services.  Order is discharged, please reorder if his needs change.   10/07/20 1126  PT Visit Information  Reason Eval/Treat Not Completed Other (comment)  Balance  Overall balance assessment Independent   Mee Hives, PT MS Acute Rehab Dept. Number: [place] [facility] [place]

## 2020-10-07 NOTE — TOC Transition Note (Signed)
Transition of Care [facility]) - CM/SW Discharge Note   Patient Details  Name: Tom Anderson MRN: [id number] Date of Birth: January 30, 1971  [facility] CM/SW Contact:  Lynnell Catalan, RN Phone Number: 10/07/2020, 11:52 AM   Clinical Narrative:     This CM was alerted by OT that pt needs shower seat at home. Order received and [employer] contacted for seat. No OT follow up recommended.

## 2020-10-09 LAB — CULTURE, BLOOD (ROUTINE X 2): Special Requests: ADEQUATE

## 2020-11-04 DIAGNOSIS — K219 Gastro-esophageal reflux disease without esophagitis: Secondary | ICD-10-CM | POA: Diagnosis present

## 2021-11-10 DIAGNOSIS — E1065 Type 1 diabetes mellitus with hyperglycemia: Secondary | ICD-10-CM | POA: Diagnosis present

## 2022-02-08 IMAGING — DX DG CHEST 1V
1 series · 2 of 2 positions shown · non-contrast
Comparison: 10/03/2020.

CLINICAL DATA: Intubation.

EXAM:
CHEST  1 VIEW

[Series 1: chest ap · 0.14mm/px · 2 of 2 slices shown]
[im 1/2]
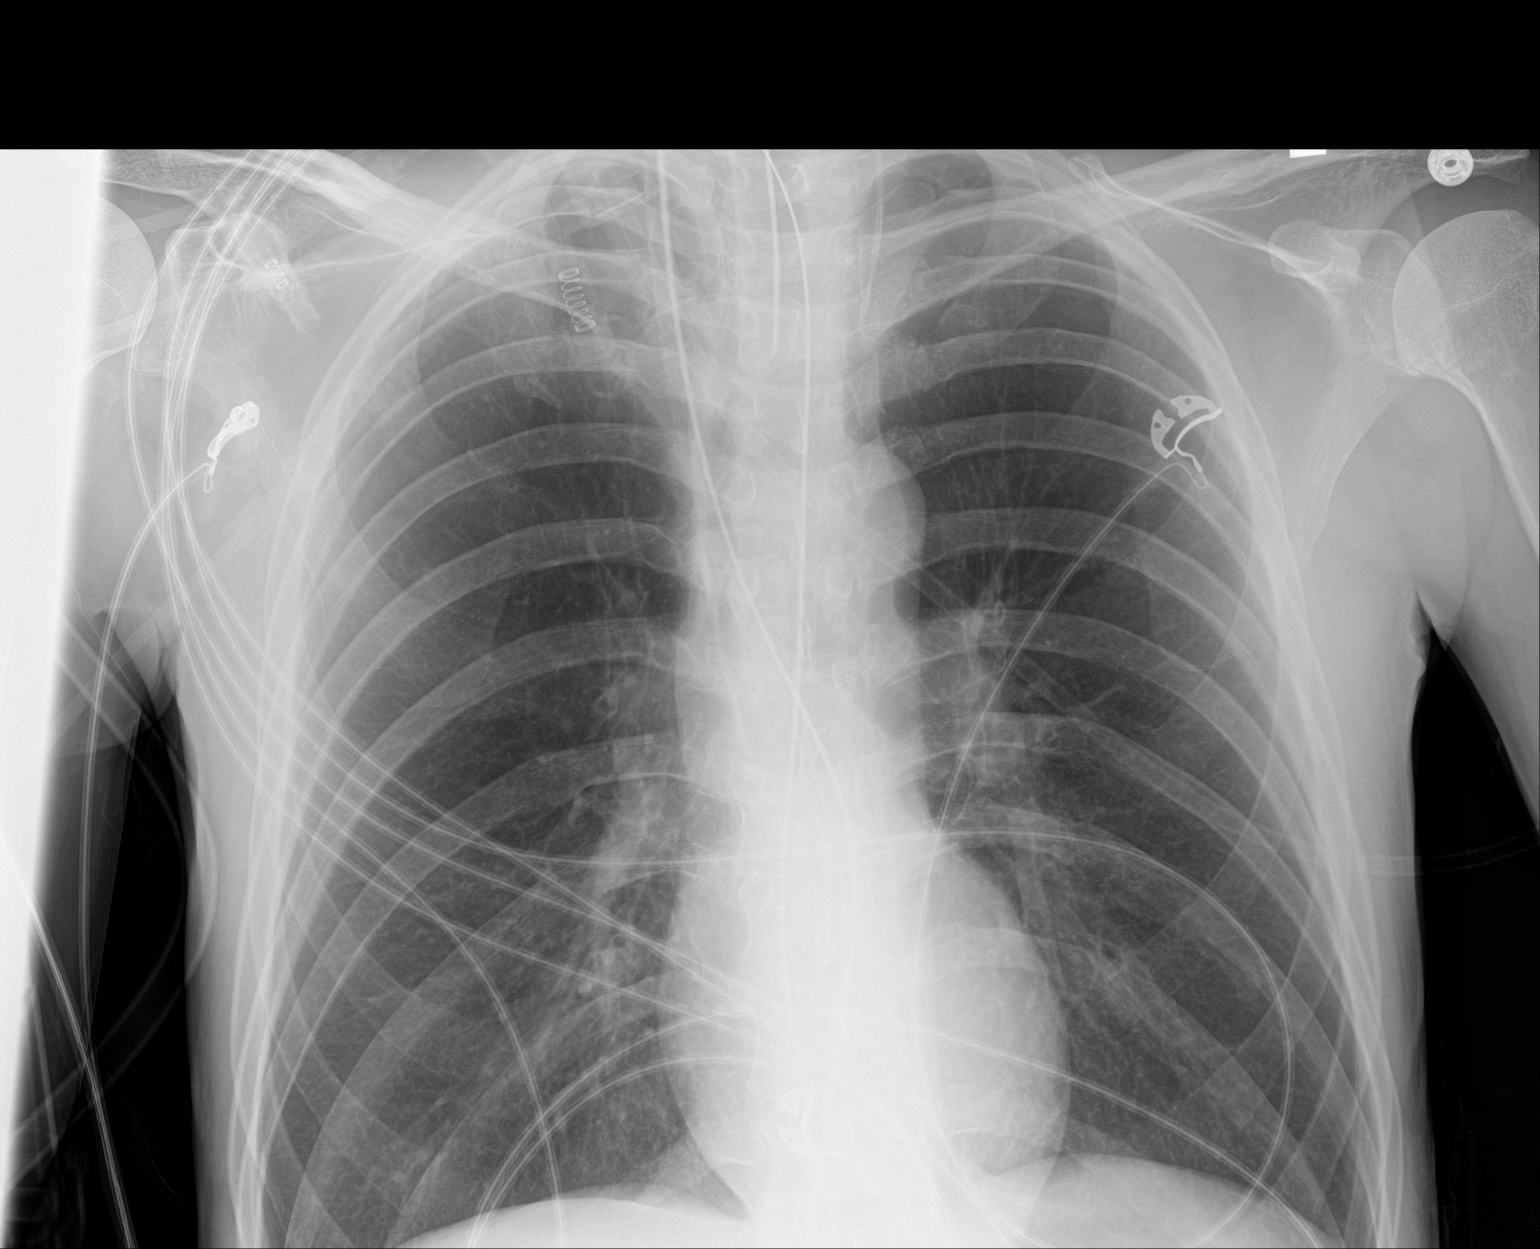
[im 2/2]
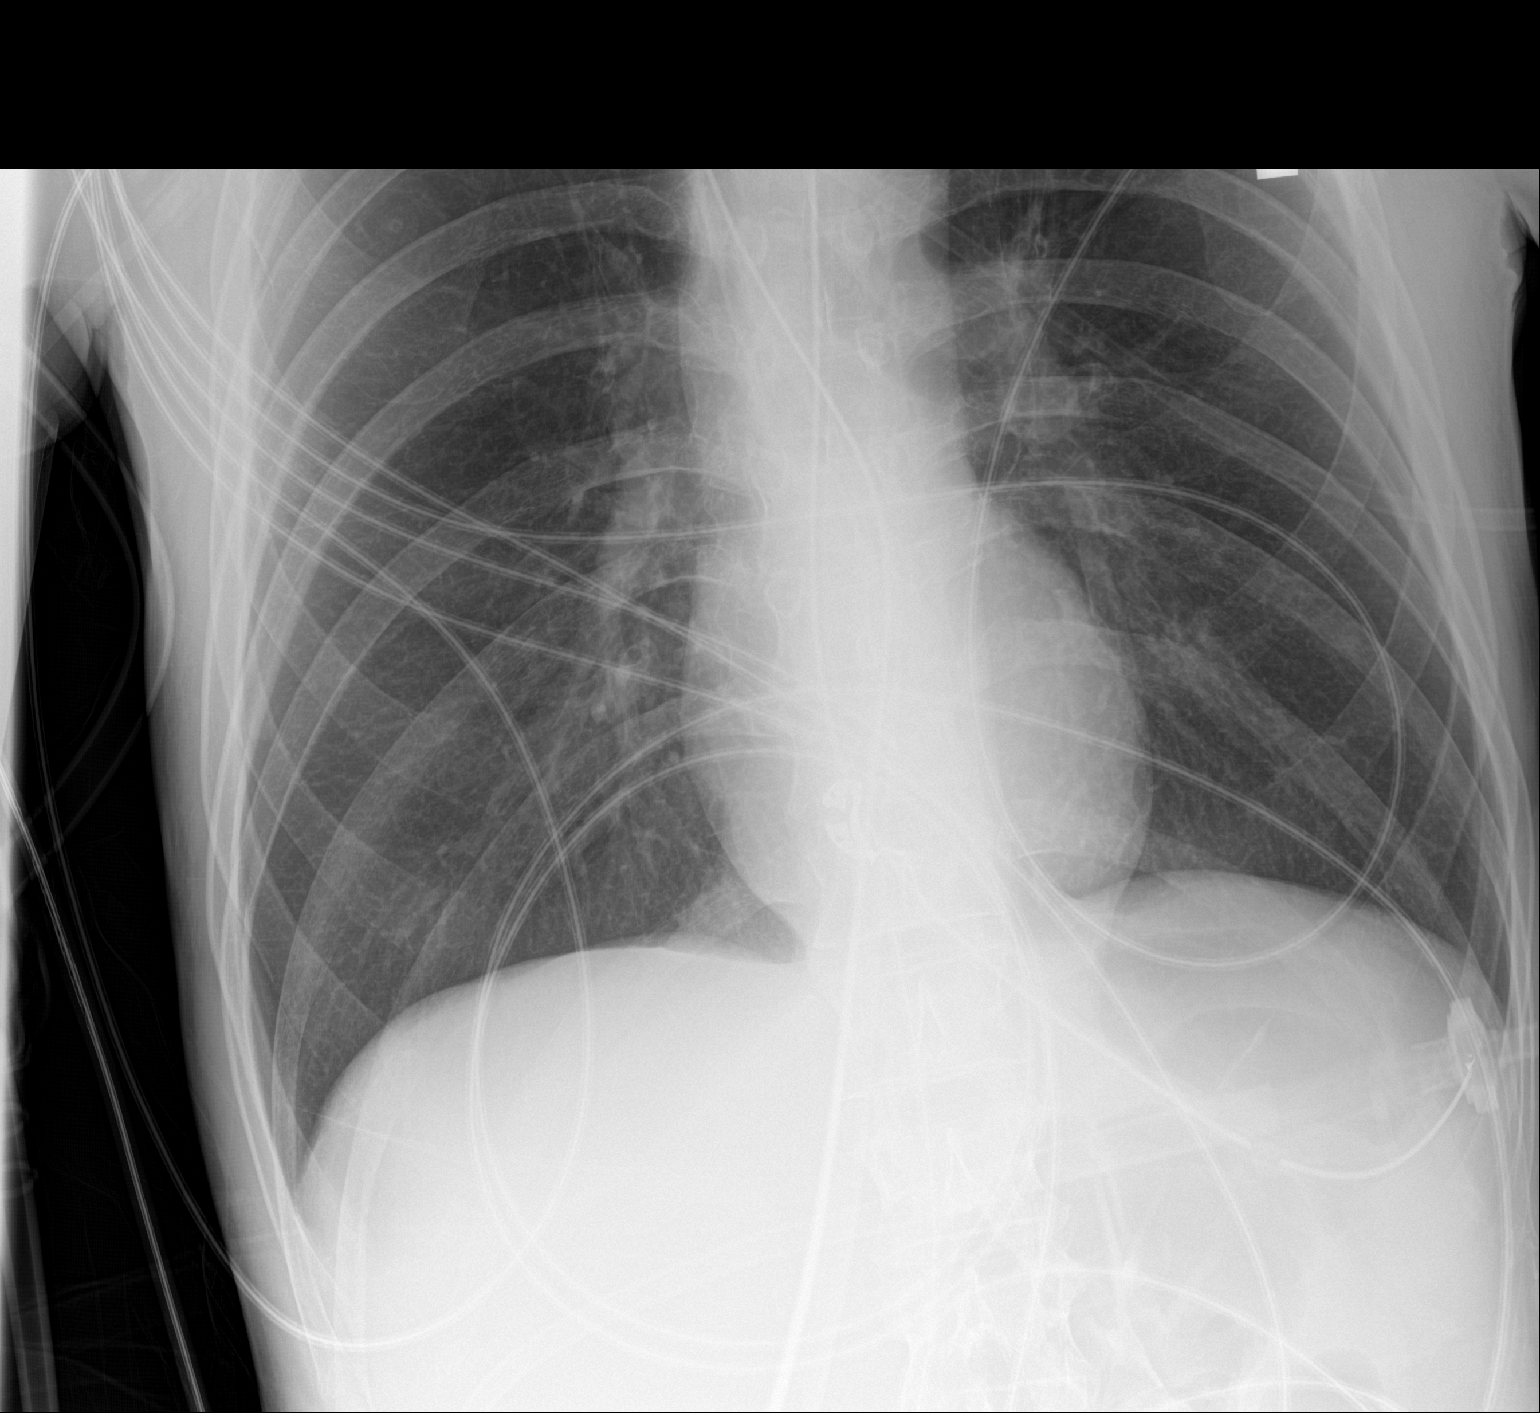

[2 of 2 positions shown; findings below may reference images not displayed]

FINDINGS: Endotracheal tube and NG tube in stable position. Heart size normal.
Mild left perihilar subsegmental atelectasis. No focal infiltrate.
No pleural effusion or pneumothorax.
IMPRESSION: 1. Lines and tubes in stable position.
2. Mild left perihilar subsegmental atelectasis.

## 2022-11-12 ENCOUNTER — Encounter (HOSPITAL_BASED_OUTPATIENT_CLINIC_OR_DEPARTMENT_OTHER): Payer: Self-pay | Admitting: Emergency Medicine

## 2022-11-12 ENCOUNTER — Emergency Department (HOSPITAL_COMMUNITY): Payer: Medicaid Other

## 2022-11-12 ENCOUNTER — Emergency Department (HOSPITAL_BASED_OUTPATIENT_CLINIC_OR_DEPARTMENT_OTHER)
Admission: EM | Admit: 2022-11-12 | Discharge: 2022-11-12 | Disposition: A | Discharge status: Home / Self Care | Payer: Medicaid Other | Attending: Emergency Medicine | Admitting: Emergency Medicine

## 2022-11-12 ENCOUNTER — Other Ambulatory Visit: Payer: Self-pay

## 2022-11-12 DIAGNOSIS — E119 Type 2 diabetes mellitus without complications: Secondary | ICD-10-CM | POA: Insufficient documentation

## 2022-11-12 DIAGNOSIS — Z794 Long term (current) use of insulin: Secondary | ICD-10-CM | POA: Diagnosis not present

## 2022-11-12 DIAGNOSIS — R1084 Generalized abdominal pain: Secondary | ICD-10-CM | POA: Diagnosis present

## 2022-11-12 DIAGNOSIS — K2900 Acute gastritis without bleeding: Secondary | ICD-10-CM

## 2022-11-12 HISTORY — DX: Type 2 diabetes mellitus without complications: E11.9

## 2022-11-12 HISTORY — DX: Depression, unspecified: F32.A

## 2022-11-12 LAB — CBC WITH DIFFERENTIAL/PLATELET
Abs Immature Granulocytes: 0.04 10*3/uL (ref 0.00–0.07)
Basophils Absolute: 0.1 10*3/uL (ref 0.0–0.1)
Basophils Relative: 1 %
Eosinophils Absolute: 0.2 10*3/uL (ref 0.0–0.5)
Eosinophils Relative: 2 %
HCT: 42 % (ref 39.0–52.0)
Hemoglobin: 14.8 g/dL (ref 13.0–17.0)
Immature Granulocytes: 1 %
Lymphocytes Relative: 32 %
Lymphs Abs: 2.5 10*3/uL (ref 0.7–4.0)
MCH: 34.3 pg — ABNORMAL HIGH (ref 26.0–34.0)
MCHC: 35.2 g/dL (ref 30.0–36.0)
MCV: 97.2 fL (ref 80.0–100.0)
Monocytes Absolute: 0.8 10*3/uL (ref 0.1–1.0)
Monocytes Relative: 10 %
Neutro Abs: 4.2 10*3/uL (ref 1.7–7.7)
Neutrophils Relative %: 54 %
Platelets: 295 10*3/uL (ref 150–400)
RBC: 4.32 MIL/uL (ref 4.22–5.81)
RDW: 11 % — ABNORMAL LOW (ref 11.5–15.5)
WBC: 7.7 10*3/uL (ref 4.0–10.5)
nRBC: 0 % (ref 0.0–0.2)

## 2022-11-12 LAB — CBG MONITORING, ED: Glucose-Capillary: 147 mg/dL — ABNORMAL HIGH (ref 70–99)

## 2022-11-12 LAB — COMPREHENSIVE METABOLIC PANEL
ALT: 20 U/L (ref 0–44)
AST: 27 U/L (ref 15–41)
Albumin: 4.5 g/dL (ref 3.5–5.0)
Alkaline Phosphatase: 107 U/L (ref 38–126)
Anion gap: 8 (ref 5–15)
BUN: 10 mg/dL (ref 6–20)
CO2: 30 mmol/L (ref 22–32)
Calcium: 9.4 mg/dL (ref 8.9–10.3)
Chloride: 95 mmol/L — ABNORMAL LOW (ref 98–111)
Creatinine, Ser: 0.85 mg/dL (ref 0.61–1.24)
GFR, Estimated: >60 mL/min (ref >60)
Glucose, Bld: 179 mg/dL — ABNORMAL HIGH (ref 70–99)
Potassium: 3.8 mmol/L (ref 3.5–5.1)
Sodium: 133 mmol/L — ABNORMAL LOW (ref 135–145)
Total Bilirubin: 0.8 mg/dL (ref 0.3–1.2)
Total Protein: 7.7 g/dL (ref 6.5–8.1)

## 2022-11-12 LAB — URINALYSIS, ROUTINE W REFLEX MICROSCOPIC
Bilirubin Urine: NEGATIVE
Glucose, UA: NEGATIVE mg/dL
Hgb urine dipstick: NEGATIVE
Ketones, ur: NEGATIVE mg/dL
Leukocytes,Ua: NEGATIVE
Nitrite: NEGATIVE
Protein, ur: NEGATIVE mg/dL
Specific Gravity, Urine: 1.015 (ref 1.005–1.030)
pH: 7 (ref 5.0–8.0)

## 2022-11-12 LAB — LIPASE, BLOOD: Lipase: 42 U/L (ref 11–51)

## 2022-11-12 MED ORDER — MORPHINE SULFATE (PF) 4 MG/ML IV SOLN
4.0000 mg | Freq: Once | INTRAVENOUS | Status: AC
  Administered 2022-11-12: 4 mg via INTRAVENOUS
  Filled 2022-11-12: qty 1

## 2022-11-12 MED ORDER — ALUM & MAG HYDROXIDE-SIMETH 200-200-20 MG/5ML PO SUSP
30.0000 mL | Freq: Once | ORAL | Status: AC
  Administered 2022-11-12: 30 mL via ORAL
  Filled 2022-11-12: qty 30

## 2022-11-12 MED ORDER — FAMOTIDINE 20 MG PO TABS
20.0000 mg | ORAL_TABLET | Freq: Once | ORAL | Status: AC
  Administered 2022-11-12: 20 mg via ORAL
  Filled 2022-11-12: qty 1

## 2022-11-12 MED ORDER — IOHEXOL 300 MG/ML  SOLN
100.0000 mL | Freq: Once | INTRAMUSCULAR | Status: AC | PRN
  Administered 2022-11-12: 100 mL via INTRAVENOUS

## 2022-11-12 MED ORDER — SODIUM CHLORIDE (PF) 0.9 % IJ SOLN
INTRAMUSCULAR | Status: AC
  Filled 2022-11-12: qty 50

## 2022-11-12 NOTE — Discharge Instructions (Signed)
Start taking your Protonix 1 pill twice a day and follow-up with your family doctor in the next couple weeks

## 2022-11-12 NOTE — ED Provider Notes (Signed)
[facility] EMERGENCY DEPARTMENT Provider Note   CSN: [id number] Arrival date & time: 11/12/22  3614     History  Chief Complaint  Patient presents with   Abdominal Pain    Tom Anderson is a 51 y.o. male.  Patient is a 51 year old male with a past medical history of diabetes, alcohol use disorder and schizophrenia presenting to the emergency department with abdominal pain.  The patient states that he woke up around midnight with diffuse cramping abdominal pain.  He denies any nausea, vomiting, diarrhea or constipation, dysuria or hematuria, fevers or chills.  He states that his blood sugars have been in the 400s recently.  States he has not tried anything for his pain at home.  The history is provided by the patient and a relative.  Abdominal Pain      Home Medications Prior to Admission medications   Medication Sig Start Date End Date Taking? Authorizing Provider  blood glucose meter kit and supplies Dispense based on patient and insurance preference. Use up to four times daily as directed. (FOR ICD-10 E10.9, E11.9). 10/07/20   [name], Tom Picket, MD  FLUoxetine (PROZAC) 40 MG capsule Take 40 mg by mouth daily.      [provider]  fluPHENAZine (PROLIXIN) 10 MG tablet Take 5 mg by mouth at bedtime.     [provider]  ibuprofen (ADVIL) 200 MG tablet Take 200-1,000 mg by mouth every 6 (six) hours as needed for fever or moderate pain.    [provider]  insulin aspart (NOVOLOG) 100 UNIT/ML FlexPen For glucose 121 to 150 use 1 unit, for 151 to 200 use 2 units, for 201 to 250 use 3 units, for 251 to 300 use 5 units, for 301 to 350 use 7 units for 351 or greater use 9 units. 10/07/20   [name], Tom Picket, MD  insulin detemir (LEVEMIR) 100 UNIT/ML FlexPen Inject 15 Units into the skin daily. 10/07/20   [name], Tom Picket, MD  Insulin Syringes, Disposable, U-100 0.3 ML MISC For insulin use. 10/07/20   [name], Tom Picket, MD  OVER  THE COUNTER MEDICATION Take 1 tablet by mouth daily. Super Beta Prostate supplement    [provider]  pantoprazole (PROTONIX) 40 MG tablet Take 1 tablet (40 mg total) by mouth daily. 10/07/20 11/06/20  [name], Tom Picket, MD      Allergies    Nitrous oxide    Review of Systems   Review of Systems  Gastrointestinal:  Positive for abdominal pain.    Physical Exam Updated Vital Signs BP 107/85 (BP Location: Left Arm)   Pulse 97   Temp 97.8 F (36.6 C) (Oral)   Resp 18   Ht 5' 9.5" (1.765 m)   Wt 63.5 kg   SpO2 93%   BMI 20.38 kg/m  Physical Exam Vitals and nursing note reviewed.  Constitutional:      General: He is not in acute distress.    Appearance: He is well-developed.  HENT:     Head: Normocephalic and atraumatic.     Mouth/Throat:     Mouth: Mucous membranes are moist.     Pharynx: Oropharynx is clear.  Eyes:     Extraocular Movements: Extraocular movements intact.  Cardiovascular:     Rate and Rhythm: Normal rate and regular rhythm.     Heart sounds: Normal heart sounds.  Pulmonary:     Effort: Pulmonary effort is normal.  Abdominal:     General: Abdomen is flat.  Palpations: Abdomen is soft.     Tenderness: There is no abdominal tenderness.  Skin:    General: Skin is warm and dry.  Neurological:     General: No focal deficit present.     Mental Status: He is alert and oriented to person, place, and time.  Psychiatric:        Mood and Affect: Mood normal.        Behavior: Behavior normal.     ED Results / Procedures / Treatments   Labs (all labs ordered are listed, but only abnormal results are displayed) Labs Reviewed  COMPREHENSIVE METABOLIC PANEL - Abnormal; Notable for the following components:      Result Value   Sodium 133 (*)    Chloride 95 (*)    Glucose, Bld 179 (*)    All other components within normal limits  CBC WITH DIFFERENTIAL/PLATELET - Abnormal; Notable for the following components:   MCH 34.3 (*)    RDW 11.0  (*)    All other components within normal limits  CBG MONITORING, ED - Abnormal; Notable for the following components:   Glucose-Capillary 147 (*)    All other components within normal limits  LIPASE, BLOOD  URINALYSIS, ROUTINE W REFLEX MICROSCOPIC    EKG None  Radiology No results found.  Procedures Procedures    Medications Ordered in ED Medications  famotidine (PEPCID) tablet 20 mg (20 mg Oral Given 11/12/22 0828)  alum & mag hydroxide-simeth (MAALOX/MYLANTA) 200-200-20 MG/5ML suspension 30 mL (30 mLs Oral Given 11/12/22 0828)  morphine (PF) 4 MG/ML injection 4 mg (4 mg Intravenous Given 11/12/22 1026)    ED Course/ Medical Decision Making/ A&P Clinical Course as of 11/12/22 1103  Thu Nov 12, 2022  0945 Patient continues to have severe pain. No significant abnormalities on labs. He will be given morphine and CTAP will be performed. [VK]  1101 CT scanner is down and will not be up and running today. I spoke with the patient and his mother and they are agreeable for ED to ED transfer for further work up. They prefer [facility]. Patient will be transferred in stable condition for CTAP and reassessment. [VK]    Clinical Course User Index [VK] Kemper Durie, DO                           Medical Decision Making This patient presents to the ED with chief complaint(s) of abdominal pain with pertinent past medical history of diabetes, alcohol use disorder, schizophrenia which further complicates the presenting complaint. The complaint involves an extensive differential diagnosis and also carries with it a high risk of complications and morbidity.    The differential diagnosis includes pancreatitis, hepatitis, gastritis, GERD, cholelithiasis or cholecystitis less likely as patient has no point abdominal tenderness, other intra-abdominal infection less likely as no point tenderness and no fevers.  Additional history obtained: Additional history obtained from family Records  reviewed Care Everywhere/External Records - outpatient endo records  ED Course and Reassessment: Upon patient's arrival to the emergency department he is awake alert and well-appearing in no acute distress.  He has no point abdominal tenderness to palpation, and glucose was normal on arrival making DKA less likely, however due to his significant risk factors he will have labs performed and will be given a GI cocktail and will be closely reassessed.  Independent labs interpretation:  The following labs were independently interpreted: within normal range  Independent visualization of imaging: Pending  Amount and/or Complexity of Data Reviewed Labs: ordered. Radiology: ordered.  Risk OTC drugs. Prescription drug management.          Final Clinical Impression(s) / ED Diagnoses Final diagnoses:  Generalized abdominal pain    Rx / DC Orders ED Discharge Orders     None         Kemper Durie, DO 11/12/22 1103

## 2022-11-12 NOTE — ED Triage Notes (Signed)
Pt is c/o abd pain and dizziness  Pt states he is diabetic and is having difficulty controlling his blood sugar at home  Pt states he woke up around midnight feeling this way  Denies N/V/D

## 2022-11-12 NOTE — ED Provider Notes (Signed)
Patient CT scan shows gastritis.  He has not been taking his Protonix but he has Protonix at home.  I told him to start back taking the Protonix twice a day and he could only use Tylenol for pain.  He will follow-up with his PCP   Bethann Berkshire, MD 11/12/22 [id number]

## 2022-11-12 NOTE — ED Notes (Signed)
Pt called from lobby, was waiting outside, ambulatory to exam room without difficulty.  States is dizzy and lightheaded with abdominal discomfort.

## 2022-12-17 ENCOUNTER — Other Ambulatory Visit: Payer: Self-pay

## 2022-12-17 ENCOUNTER — Emergency Department (HOSPITAL_BASED_OUTPATIENT_CLINIC_OR_DEPARTMENT_OTHER): Payer: Medicaid Other

## 2022-12-17 ENCOUNTER — Encounter (HOSPITAL_BASED_OUTPATIENT_CLINIC_OR_DEPARTMENT_OTHER): Payer: Self-pay | Admitting: Emergency Medicine

## 2022-12-17 ENCOUNTER — Emergency Department (HOSPITAL_BASED_OUTPATIENT_CLINIC_OR_DEPARTMENT_OTHER)
Admission: EM | Admit: 2022-12-17 | Discharge: 2022-12-17 | Disposition: A | Discharge status: Home / Self Care | Payer: Medicaid Other | Attending: Emergency Medicine | Admitting: Emergency Medicine

## 2022-12-17 DIAGNOSIS — J45909 Unspecified asthma, uncomplicated: Secondary | ICD-10-CM | POA: Diagnosis not present

## 2022-12-17 DIAGNOSIS — R109 Unspecified abdominal pain: Secondary | ICD-10-CM | POA: Diagnosis not present

## 2022-12-17 DIAGNOSIS — E1065 Type 1 diabetes mellitus with hyperglycemia: Secondary | ICD-10-CM | POA: Diagnosis not present

## 2022-12-17 DIAGNOSIS — Z1152 Encounter for screening for COVID-19: Secondary | ICD-10-CM | POA: Diagnosis not present

## 2022-12-17 DIAGNOSIS — D72829 Elevated white blood cell count, unspecified: Secondary | ICD-10-CM | POA: Insufficient documentation

## 2022-12-17 DIAGNOSIS — Z794 Long term (current) use of insulin: Secondary | ICD-10-CM | POA: Diagnosis not present

## 2022-12-17 DIAGNOSIS — E871 Hypo-osmolality and hyponatremia: Secondary | ICD-10-CM | POA: Insufficient documentation

## 2022-12-17 HISTORY — DX: Unspecified asthma, uncomplicated: J45.909

## 2022-12-17 LAB — CBC WITH DIFFERENTIAL/PLATELET
Abs Immature Granulocytes: 0.08 10*3/uL — ABNORMAL HIGH (ref 0.00–0.07)
Basophils Absolute: 0.1 10*3/uL (ref 0.0–0.1)
Basophils Relative: 1 %
Eosinophils Absolute: 0.3 10*3/uL (ref 0.0–0.5)
Eosinophils Relative: 2 %
HCT: 41.3 % (ref 39.0–52.0)
Hemoglobin: 14.4 g/dL (ref 13.0–17.0)
Immature Granulocytes: 1 %
Lymphocytes Relative: 23 %
Lymphs Abs: 3 10*3/uL (ref 0.7–4.0)
MCH: 33.7 pg (ref 26.0–34.0)
MCHC: 34.9 g/dL (ref 30.0–36.0)
MCV: 96.7 fL (ref 80.0–100.0)
Monocytes Absolute: 1 10*3/uL (ref 0.1–1.0)
Monocytes Relative: 7 %
Neutro Abs: 8.8 10*3/uL — ABNORMAL HIGH (ref 1.7–7.7)
Neutrophils Relative %: 66 %
Platelets: 426 10*3/uL — ABNORMAL HIGH (ref 150–400)
RBC: 4.27 MIL/uL (ref 4.22–5.81)
RDW: 11.1 % — ABNORMAL LOW (ref 11.5–15.5)
WBC: 13.2 10*3/uL — ABNORMAL HIGH (ref 4.0–10.5)
nRBC: 0 % (ref 0.0–0.2)

## 2022-12-17 LAB — COMPREHENSIVE METABOLIC PANEL
ALT: 21 U/L (ref 0–44)
AST: 27 U/L (ref 15–41)
Albumin: 4.7 g/dL (ref 3.5–5.0)
Alkaline Phosphatase: 150 U/L — ABNORMAL HIGH (ref 38–126)
Anion gap: 14 (ref 5–15)
BUN: 20 mg/dL (ref 6–20)
CO2: 27 mmol/L (ref 22–32)
Calcium: 9.4 mg/dL (ref 8.9–10.3)
Chloride: 88 mmol/L — ABNORMAL LOW (ref 98–111)
Creatinine, Ser: 1.16 mg/dL (ref 0.61–1.24)
GFR, Estimated: >60 mL/min (ref >60)
Glucose, Bld: 396 mg/dL — ABNORMAL HIGH (ref 70–99)
Potassium: 4.6 mmol/L (ref 3.5–5.1)
Sodium: 129 mmol/L — ABNORMAL LOW (ref 135–145)
Total Bilirubin: 1.2 mg/dL (ref 0.3–1.2)
Total Protein: 8.1 g/dL (ref 6.5–8.1)

## 2022-12-17 LAB — URINALYSIS, MICROSCOPIC (REFLEX)

## 2022-12-17 LAB — URINALYSIS, ROUTINE W REFLEX MICROSCOPIC
Glucose, UA: >=500 mg/dL — AB
Ketones, ur: 40 mg/dL — AB
Leukocytes,Ua: NEGATIVE
Nitrite: NEGATIVE
Protein, ur: >=300 mg/dL — AB
Specific Gravity, Urine: >=1.03 (ref 1.005–1.030)
pH: 6 (ref 5.0–8.0)

## 2022-12-17 LAB — LIPASE, BLOOD: Lipase: 32 U/L (ref 11–51)

## 2022-12-17 LAB — RESP PANEL BY RT-PCR (RSV, FLU A&B, COVID)  RVPGX2
Influenza A by PCR: NEGATIVE
Influenza B by PCR: NEGATIVE
Resp Syncytial Virus by PCR: NEGATIVE
SARS Coronavirus 2 by RT PCR: NEGATIVE

## 2022-12-17 LAB — CBG MONITORING, ED
Glucose-Capillary: 385 mg/dL — ABNORMAL HIGH (ref 70–99)
Glucose-Capillary: 358 mg/dL — ABNORMAL HIGH (ref 70–99)

## 2022-12-17 MED ORDER — SODIUM CHLORIDE 0.9 % IV BOLUS
1000.0000 mL | Freq: Once | INTRAVENOUS | Status: AC
  Administered 2022-12-17: 1000 mL via INTRAVENOUS

## 2022-12-17 MED ORDER — ALUM & MAG HYDROXIDE-SIMETH 200-200-20 MG/5ML PO SUSP
30.0000 mL | Freq: Once | ORAL | Status: AC
  Administered 2022-12-17: 30 mL via ORAL
  Filled 2022-12-17: qty 30

## 2022-12-17 MED ORDER — ONDANSETRON HCL 4 MG PO TABS: 4.0000 mg | ORAL_TABLET | Freq: Four times a day (QID) | ORAL | 0 refills | Status: DC | PRN

## 2022-12-17 MED ORDER — FAMOTIDINE 20 MG PO TABS
20.0000 mg | ORAL_TABLET | Freq: Once | ORAL | Status: AC
  Administered 2022-12-17: 20 mg via ORAL
  Filled 2022-12-17: qty 1

## 2022-12-17 MED ORDER — METOCLOPRAMIDE HCL 5 MG/ML IJ SOLN
10.0000 mg | Freq: Once | INTRAMUSCULAR | Status: AC
  Administered 2022-12-17: 10 mg via INTRAVENOUS
  Filled 2022-12-17: qty 2

## 2022-12-17 MED ORDER — INSULIN ASPART 100 UNIT/ML IJ SOLN
10.0000 [IU] | Freq: Once | INTRAMUSCULAR | Status: AC
  Administered 2022-12-17: 10 [IU] via SUBCUTANEOUS

## 2022-12-17 MED ORDER — IOHEXOL 300 MG/ML  SOLN
100.0000 mL | Freq: Once | INTRAMUSCULAR | Status: AC | PRN
  Administered 2022-12-17: 100 mL via INTRAVENOUS

## 2022-12-17 MED ORDER — ONDANSETRON HCL 4 MG/2ML IJ SOLN
4.0000 mg | Freq: Once | INTRAMUSCULAR | Status: AC
  Administered 2022-12-17: 4 mg via INTRAVENOUS
  Filled 2022-12-17: qty 2

## 2022-12-17 NOTE — ED Triage Notes (Signed)
Generalized body aches, no cold symptoms, some nausea, no known fever.  No dysuria.  Symptoms started last night.

## 2022-12-17 NOTE — ED Provider Notes (Addendum)
[facility] EMERGENCY DEPARTMENT Provider Note   CSN: [id number] Arrival date & time: 12/17/22  1517     History  Chief Complaint  Patient presents with   Generalized Body Aches    Tom Anderson is a 52 y.o. male with medical history of asthma, depression, diabetes type 1, schizophrenia.  Patient presents to ED for evaluation of abdominal pain.  Patient reports that for the last 2 days he has had discomfort throughout his entire trunk.  Patient states that it feels as if "my muscles are nauseous, can muscles be nauseous?".  Patient states that the symptoms been ongoing for the last 2 days.  Patient denies any chest pain, shortness of breath, diarrhea, dysuria, sore throat, URI symptoms, fevers.  Patient reports he is type I diabetic, takes insulin.  Patient reports that his sugars typically "run high".  Patient continues that he is also an alcoholic.  Patient reports that he drinks 242 ounce beers per night.  Patient states that yesterday he was unable to drink his usual amount of alcohol due to feeling ill.  The patient is unsure if he is nauseous.  The patient is unable to discern whether or not he is experiencing nausea versus body aches or cramps.  The patient denies any vomiting.  HPI     Home Medications Prior to Admission medications   Medication Sig Start Date End Date Taking? Authorizing Provider  ondansetron (ZOFRAN) 4 MG tablet Take 1 tablet (4 mg total) by mouth every 6 (six) hours as needed for nausea or vomiting. 12/17/22  Yes Azucena Cecil, PA-C  VENTOLIN HFA 108 (90 Base) MCG/ACT inhaler INHALE 1 PUFF BY MOUTH INTO THE LUNGS EVERY 4 HOURS AS NEEDED FOR WHEEZING 11/20/22  Yes [provider]  blood glucose meter kit and supplies Dispense based on patient and insurance preference. Use up to four times daily as directed. (FOR ICD-10 E10.9, E11.9). 10/07/20   [name], Jimmy Picket, MD  insulin aspart (NOVOLOG) 100 UNIT/ML FlexPen For glucose 121 to 150  use 1 unit, for 151 to 200 use 2 units, for 201 to 250 use 3 units, for 251 to 300 use 5 units, for 301 to 350 use 7 units for 351 or greater use 9 units. 10/07/20   [name], Jimmy Picket, MD  insulin detemir (LEVEMIR) 100 UNIT/ML FlexPen Inject 15 Units into the skin daily. 10/07/20   [name], Jimmy Picket, MD  Insulin Syringes, Disposable, U-100 0.3 ML MISC For insulin use. 10/07/20   [name], Jimmy Picket, MD  OVER THE COUNTER MEDICATION Take 1 tablet by mouth daily. Super Beta Prostate supplement    [provider]      Allergies    Nitrous oxide    Review of Systems   Review of Systems  Constitutional:  Negative for fever.  Respiratory:  Negative for shortness of breath.   Cardiovascular:  Negative for chest pain.  Gastrointestinal:  Positive for abdominal pain. Negative for diarrhea and vomiting.  Genitourinary:  Negative for dysuria.  All other systems reviewed and are negative.   Physical Exam Updated Vital Signs BP 112/78   Pulse 97   Temp 98 F (36.7 C) (Oral)   Resp 19   Ht 5\' 9"  (1.753 m)   Wt 63.5 kg   SpO2 97%   BMI 20.67 kg/m  Physical Exam Vitals and nursing note reviewed.  HENT:     Head: Normocephalic and atraumatic.     Nose: Nose normal.     Mouth/Throat:  Mouth: Mucous membranes are moist.     Pharynx: Oropharynx is clear.  Eyes:     Extraocular Movements: Extraocular movements intact.     Conjunctiva/sclera: Conjunctivae normal.     Pupils: Pupils are equal, round, and reactive to light.  Cardiovascular:     Rate and Rhythm: Normal rate and regular rhythm.  Pulmonary:     Effort: Pulmonary effort is normal.     Breath sounds: Normal breath sounds. No wheezing.  Abdominal:     General: Abdomen is flat.     Palpations: Abdomen is soft.     Tenderness: There is no abdominal tenderness.     Comments: All 4 quadrants of abdomen palpated without findings of tenderness, rebound or guarding.  Musculoskeletal:     Cervical back: Normal  range of motion and neck supple. No tenderness.     Right lower leg: No edema.     Left lower leg: No edema.  Skin:    General: Skin is warm and dry.     Capillary Refill: Capillary refill takes less than 2 seconds.  Neurological:     Mental Status: He is oriented to person, place, and time.     ED Results / Procedures / Treatments   Labs (all labs ordered are listed, but only abnormal results are displayed) Labs Reviewed  CBC WITH DIFFERENTIAL/PLATELET - Abnormal; Notable for the following components:      Result Value   WBC 13.2 (*)    RDW 11.1 (*)    Platelets 426 (*)    Neutro Abs 8.8 (*)    Abs Immature Granulocytes 0.08 (*)    All other components within normal limits  COMPREHENSIVE METABOLIC PANEL - Abnormal; Notable for the following components:   Sodium 129 (*)    Chloride 88 (*)    Glucose, Bld 396 (*)    Alkaline Phosphatase 150 (*)    All other components within normal limits  URINALYSIS, ROUTINE W REFLEX MICROSCOPIC - Abnormal; Notable for the following components:   Glucose, UA >=500 (*)    Hgb urine dipstick TRACE (*)    Bilirubin Urine SMALL (*)    Ketones, ur 40 (*)    Protein, ur >=300 (*)    All other components within normal limits  URINALYSIS, MICROSCOPIC (REFLEX) - Abnormal; Notable for the following components:   Bacteria, UA RARE (*)    All other components within normal limits  CBG MONITORING, ED - Abnormal; Notable for the following components:   Glucose-Capillary 385 (*)    All other components within normal limits  CBG MONITORING, ED - Abnormal; Notable for the following components:   Glucose-Capillary 358 (*)    All other components within normal limits  RESP PANEL BY RT-PCR (RSV, FLU A&B, COVID)  RVPGX2  LIPASE, BLOOD    EKG None  Radiology CT ABDOMEN PELVIS W CONTRAST  Result Date: 12/17/2022 CLINICAL DATA:  Abdominal pain, acute, nonlocalized EXAM: CT ABDOMEN AND PELVIS WITH CONTRAST TECHNIQUE: Multidetector CT imaging of the  abdomen and pelvis was performed using the standard protocol following bolus administration of intravenous contrast. RADIATION DOSE REDUCTION: This exam was performed according to the departmental dose-optimization program which includes automated exposure control, adjustment of the mA and/or kV according to patient size and/or use of iterative reconstruction technique. CONTRAST:  OMNIPAQUE IOHEXOL 300 MG/ML  SOLN COMPARISON:  Ct abd/pelvis 11/12/22 FINDINGS: Lower chest: No acute abnormality. Hepatobiliary: Vague 1.9 cm hypodensity within the lateral left hepatic lobe along the false form  ligament likely represents focal fatty infiltration. No gallstones, gallbladder wall thickening, or pericholecystic fluid. No biliary dilatation. Pancreas: No focal lesion. Normal pancreatic contour. No surrounding inflammatory changes. No main pancreatic ductal dilatation. Spleen: Normal in size without focal abnormality. Adrenals/Urinary Tract: No adrenal nodule bilaterally. Bilateral kidneys enhance symmetrically. Subcentimeter hypodensities are too small to characterize-no further follow-up indicated. No hydronephrosis. No hydroureter. The urinary bladder is unremarkable. Stomach/Bowel: Stomach is within normal limits. No evidence of bowel wall thickening or dilatation. Appendix appears normal. Vascular/Lymphatic: No abdominal aorta or iliac aneurysm. Mild atherosclerotic plaque of the aorta and its branches. No abdominal, pelvic, or inguinal lymphadenopathy. Reproductive: Prostate is unremarkable. Other: No intraperitoneal free fluid. No intraperitoneal free gas. No organized fluid collection. Musculoskeletal: No abdominal wall hernia or abnormality. No suspicious lytic or blastic osseous lesions. No acute displaced fracture. Chronic T11 anterior wedge compression fracture. IMPRESSION: 1. No acute intra-abdominal or intrapelvic abnormality. 2.  Aortic Atherosclerosis (ICD10-I70.0). Electronically Signed   By: Iven Finn M.D.   On: 12/17/2022 19:58   DG Chest 2 View  Result Date: 12/17/2022 CLINICAL DATA:  Provided history: Generalized body aches.  Nausea. EXAM: CHEST - 2 VIEW COMPARISON:  Chest radiographs 10/04/2020 and earlier. FINDINGS: Heart size within normal limits. Mild blunting of the bilateral costophrenic angles, new from the prior examination of 10/04/2020. Small bilateral pleural effusions cannot be excluded. No appreciable airspace consolidation or pulmonary edema. No evidence of pneumothorax. Dextrocurvature of the thoracic spine. Unchanged mild chronic anterior wedge deformity of the T12 vertebral body. IMPRESSION: No appreciable airspace consolidation or pulmonary edema. Mild blunting of the bilateral costophrenic angles, new from the prior examination of 10/04/2020. Small bilateral pleural effusions cannot be excluded. Dextrocurvature of the thoracic spine. Electronically Signed   By: Kellie Simmering D.O.   On: 12/17/2022 15:49    Procedures Procedures    Medications Ordered in ED Medications  sodium chloride 0.9 % bolus 1,000 mL (0 mLs Intravenous Stopped 12/17/22 1932)  metoCLOPramide (REGLAN) injection 10 mg (10 mg Intravenous Given 12/17/22 1822)  insulin aspart (novoLOG) injection 10 Units (10 Units Subcutaneous Given 12/17/22 1818)  famotidine (PEPCID) tablet 20 mg (20 mg Oral Given 12/17/22 1813)  alum & mag hydroxide-simeth (MAALOX/MYLANTA) 200-200-20 MG/5ML suspension 30 mL (30 mLs Oral Given 12/17/22 1813)  iohexol (OMNIPAQUE) 300 MG/ML solution 100 mL (100 mLs Intravenous Contrast Given 12/17/22 1942)    ED Course/ Medical Decision Making/ A&P                          Medical Decision Making Amount and/or Complexity of Data Reviewed Labs: ordered. Radiology: ordered.  Risk OTC drugs. Prescription drug management.   52 year old male presents to ED for evaluation.  Please see HPI for further details.  On examination the patient is afebrile and nontachycardic.  The patient  lung sounds are clear bilaterally, he is not hypoxic on room air.  Patient abdomen is soft and compressible throughout, no rebound guarding or tenderness.  The patient is nontoxic in appearance.  The patient is alert and oriented x 3.  Patient workup initiated in triage includes CBC, CMP, urinalysis, respiratory panel, chest x-ray, lipase.  Patient CBC with leukocytosis of 13.2.  Hemoglobin stable.  Patient CMP shows decreased sodium to 129, elevated glucose to 396.  Patient urinalysis shows glucose, hemoglobin, trace bilirubin and small ketones.  The patient respiratory panel is negative for all.  Patient's x-ray shows no consolidations or effusions.  Patient initially provided  10 mg Reglan for suspected gastroparesis, 1 L fluid, GI cocktail, famotidine, insulin 10 units.  After this was given, patient states that abdominal discomfort did decrease however was still present.  At this time, decision was made to CT scan patient's abdomen and pelvis.  CT scan does not show any abnormality.  Patient recently diagnosed with gastritis in December.  Patient reports he is continuing to drink.  Patient states at this time he was advised to double up on his Protonix dosage however he never did this, he states that he has not taken Protonix in 2 months.  At this time, patient could be suffering from gastritis.  The patient will be advised to continue taking Protonix, he states he has plenty at home.  Patient will also be sent home with Zofran.  Patient advised to follow-up with PCP this week.  Patient provided return precautions and he voiced understanding.  Patient encouraged to return to the ED with any new or worsening signs or symptoms.  Patient stable for discharge.  Patient case discussed with Dr. Clarice Pole who voices agreement with plan of management.  Final Clinical Impression(s) / ED Diagnoses Final diagnoses:  Abdominal pain, unspecified abdominal location    Rx / DC Orders ED Discharge Orders           Ordered    ondansetron (ZOFRAN) 4 MG tablet  Every 6 hours PRN        12/17/22 2117               Clent Ridges 12/17/22 2141    Arby Barrette, MD 12/18/22 1950

## 2022-12-17 NOTE — Discharge Instructions (Addendum)
Return to the ED with any new or worsening signs or symptoms Please discontinue use of alcohol.  I believe this is contributing to your abdominal discomfort. Please begin taking dosage of Protonix that you were advised to take.  Please also utilize Zofran which I prescribed you for nausea. Please follow-up with PCP this week

## 2023-01-12 ENCOUNTER — Other Ambulatory Visit: Payer: Self-pay

## 2023-01-12 ENCOUNTER — Inpatient Hospital Stay (HOSPITAL_BASED_OUTPATIENT_CLINIC_OR_DEPARTMENT_OTHER)
Admission: EM | Admit: 2023-01-12 | Discharge: 2023-01-15 | DRG: 638 | Disposition: A | Discharge status: Home / Self Care | Payer: Medicaid Other | Attending: Internal Medicine | Admitting: Internal Medicine

## 2023-01-12 ENCOUNTER — Encounter (HOSPITAL_BASED_OUTPATIENT_CLINIC_OR_DEPARTMENT_OTHER): Payer: Self-pay | Admitting: Emergency Medicine

## 2023-01-12 DIAGNOSIS — Z888 Allergy status to other drugs, medicaments and biological substances status: Secondary | ICD-10-CM

## 2023-01-12 DIAGNOSIS — Z794 Long term (current) use of insulin: Secondary | ICD-10-CM

## 2023-01-12 DIAGNOSIS — G250 Essential tremor: Secondary | ICD-10-CM | POA: Diagnosis not present

## 2023-01-12 DIAGNOSIS — F1210 Cannabis abuse, uncomplicated: Secondary | ICD-10-CM | POA: Diagnosis present

## 2023-01-12 DIAGNOSIS — J45909 Unspecified asthma, uncomplicated: Secondary | ICD-10-CM | POA: Diagnosis not present

## 2023-01-12 DIAGNOSIS — R809 Proteinuria, unspecified: Secondary | ICD-10-CM | POA: Diagnosis not present

## 2023-01-12 DIAGNOSIS — F1010 Alcohol abuse, uncomplicated: Secondary | ICD-10-CM | POA: Diagnosis not present

## 2023-01-12 DIAGNOSIS — Z9151 Personal history of suicidal behavior: Secondary | ICD-10-CM | POA: Diagnosis not present

## 2023-01-12 DIAGNOSIS — K219 Gastro-esophageal reflux disease without esophagitis: Secondary | ICD-10-CM | POA: Diagnosis not present

## 2023-01-12 DIAGNOSIS — E876 Hypokalemia: Secondary | ICD-10-CM | POA: Diagnosis not present

## 2023-01-12 DIAGNOSIS — G2401 Drug induced subacute dyskinesia: Secondary | ICD-10-CM | POA: Diagnosis present

## 2023-01-12 DIAGNOSIS — Z91199 Patient's noncompliance with other medical treatment and regimen due to unspecified reason: Secondary | ICD-10-CM

## 2023-01-12 DIAGNOSIS — F209 Schizophrenia, unspecified: Secondary | ICD-10-CM | POA: Diagnosis present

## 2023-01-12 DIAGNOSIS — R350 Frequency of micturition: Secondary | ICD-10-CM | POA: Diagnosis present

## 2023-01-12 DIAGNOSIS — F329 Major depressive disorder, single episode, unspecified: Secondary | ICD-10-CM | POA: Diagnosis not present

## 2023-01-12 DIAGNOSIS — F17210 Nicotine dependence, cigarettes, uncomplicated: Secondary | ICD-10-CM | POA: Diagnosis present

## 2023-01-12 DIAGNOSIS — E1110 Type 2 diabetes mellitus with ketoacidosis without coma: Secondary | ICD-10-CM | POA: Diagnosis present

## 2023-01-12 DIAGNOSIS — Z833 Family history of diabetes mellitus: Secondary | ICD-10-CM | POA: Diagnosis not present

## 2023-01-12 DIAGNOSIS — E1010 Type 1 diabetes mellitus with ketoacidosis without coma: Secondary | ICD-10-CM | POA: Diagnosis present

## 2023-01-12 DIAGNOSIS — Z1152 Encounter for screening for COVID-19: Secondary | ICD-10-CM | POA: Diagnosis not present

## 2023-01-12 DIAGNOSIS — E1011 Type 1 diabetes mellitus with ketoacidosis with coma: Secondary | ICD-10-CM | POA: Diagnosis not present

## 2023-01-12 DIAGNOSIS — E1065 Type 1 diabetes mellitus with hyperglycemia: Secondary | ICD-10-CM | POA: Diagnosis present

## 2023-01-12 DIAGNOSIS — F1220 Cannabis dependence, uncomplicated: Secondary | ICD-10-CM | POA: Diagnosis present

## 2023-01-12 DIAGNOSIS — Z79899 Other long term (current) drug therapy: Secondary | ICD-10-CM

## 2023-01-12 DIAGNOSIS — F819 Developmental disorder of scholastic skills, unspecified: Secondary | ICD-10-CM | POA: Diagnosis not present

## 2023-01-12 DIAGNOSIS — R1084 Generalized abdominal pain: Principal | ICD-10-CM

## 2023-01-12 DIAGNOSIS — R4701 Aphasia: Secondary | ICD-10-CM | POA: Diagnosis present

## 2023-01-12 DIAGNOSIS — F1910 Other psychoactive substance abuse, uncomplicated: Secondary | ICD-10-CM | POA: Diagnosis present

## 2023-01-12 LAB — BASIC METABOLIC PANEL
Anion gap: 11 (ref 5–15)
Anion gap: 11 (ref 5–15)
BUN: 16 mg/dL (ref 6–20)
BUN: 16 mg/dL (ref 6–20)
CO2: 18 mmol/L — ABNORMAL LOW (ref 22–32)
CO2: 23 mmol/L (ref 22–32)
Calcium: 8.5 mg/dL — ABNORMAL LOW (ref 8.9–10.3)
Calcium: 8.8 mg/dL — ABNORMAL LOW (ref 8.9–10.3)
Chloride: 101 mmol/L (ref 98–111)
Chloride: 98 mmol/L (ref 98–111)
Creatinine, Ser: 1.03 mg/dL (ref 0.61–1.24)
Creatinine, Ser: 0.98 mg/dL (ref 0.61–1.24)
GFR, Estimated: >60 mL/min (ref >60)
GFR, Estimated: >60 mL/min (ref >60)
Glucose, Bld: 178 mg/dL — ABNORMAL HIGH (ref 70–99)
Glucose, Bld: 117 mg/dL — ABNORMAL HIGH (ref 70–99)
Potassium: 3.6 mmol/L (ref 3.5–5.1)
Potassium: 3.4 mmol/L — ABNORMAL LOW (ref 3.5–5.1)
Sodium: 130 mmol/L — ABNORMAL LOW (ref 135–145)
Sodium: 132 mmol/L — ABNORMAL LOW (ref 135–145)

## 2023-01-12 LAB — CBC WITH DIFFERENTIAL/PLATELET
Abs Immature Granulocytes: 0.14 10*3/uL — ABNORMAL HIGH (ref 0.00–0.07)
Basophils Absolute: 0.1 10*3/uL (ref 0.0–0.1)
Basophils Relative: 0 %
Eosinophils Absolute: 0 10*3/uL (ref 0.0–0.5)
Eosinophils Relative: 0 %
HCT: 43.5 % (ref 39.0–52.0)
Hemoglobin: 15.5 g/dL (ref 13.0–17.0)
Immature Granulocytes: 1 %
Lymphocytes Relative: 14 %
Lymphs Abs: 1.9 10*3/uL (ref 0.7–4.0)
MCH: 33.6 pg (ref 26.0–34.0)
MCHC: 35.6 g/dL (ref 30.0–36.0)
MCV: 94.4 fL (ref 80.0–100.0)
Monocytes Absolute: 0.9 10*3/uL (ref 0.1–1.0)
Monocytes Relative: 6 %
Neutro Abs: 11.3 10*3/uL — ABNORMAL HIGH (ref 1.7–7.7)
Neutrophils Relative %: 79 %
Platelets: 168 10*3/uL (ref 150–400)
RBC: 4.61 MIL/uL (ref 4.22–5.81)
RDW: 11.1 % — ABNORMAL LOW (ref 11.5–15.5)
WBC: 14.3 10*3/uL — ABNORMAL HIGH (ref 4.0–10.5)
nRBC: 0 % (ref 0.0–0.2)

## 2023-01-12 LAB — URINALYSIS, ROUTINE W REFLEX MICROSCOPIC
Glucose, UA: >=500 mg/dL — AB
Ketones, ur: >=80 mg/dL — AB
Leukocytes,Ua: NEGATIVE
Nitrite: NEGATIVE
Protein, ur: 100 mg/dL — AB
Specific Gravity, Urine: >=1.03 (ref 1.005–1.030)
pH: 5 (ref 5.0–8.0)

## 2023-01-12 LAB — COMPREHENSIVE METABOLIC PANEL
ALT: 23 U/L (ref 0–44)
AST: 21 U/L (ref 15–41)
Albumin: 4 g/dL (ref 3.5–5.0)
Alkaline Phosphatase: 123 U/L (ref 38–126)
Anion gap: 22 — ABNORMAL HIGH (ref 5–15)
BUN: 19 mg/dL (ref 6–20)
CO2: 15 mmol/L — ABNORMAL LOW (ref 22–32)
Calcium: 8.4 mg/dL — ABNORMAL LOW (ref 8.9–10.3)
Chloride: 89 mmol/L — ABNORMAL LOW (ref 98–111)
Creatinine, Ser: 1.23 mg/dL (ref 0.61–1.24)
GFR, Estimated: >60 mL/min (ref >60)
Glucose, Bld: 425 mg/dL — ABNORMAL HIGH (ref 70–99)
Potassium: 4.3 mmol/L (ref 3.5–5.1)
Sodium: 126 mmol/L — ABNORMAL LOW (ref 135–145)
Total Bilirubin: 1.9 mg/dL — ABNORMAL HIGH (ref 0.3–1.2)
Total Protein: 6.8 g/dL (ref 6.5–8.1)

## 2023-01-12 LAB — I-STAT VENOUS BLOOD GAS, ED
Acid-base deficit: 17 mmol/L — ABNORMAL HIGH (ref 0.0–2.0)
Bicarbonate: 8.3 mmol/L — ABNORMAL LOW (ref 20.0–28.0)
Calcium, Ion: 1.16 mmol/L (ref 1.15–1.40)
HCT: 45 % (ref 39.0–52.0)
Hemoglobin: 15.3 g/dL (ref 13.0–17.0)
O2 Saturation: 95 %
Patient temperature: 98.3
Potassium: 4.2 mmol/L (ref 3.5–5.1)
Sodium: 126 mmol/L — ABNORMAL LOW (ref 135–145)
TCO2: 9 mmol/L — ABNORMAL LOW (ref 22–32)
pCO2, Ven: 19.2 mmHg — CL (ref 44–60)
pH, Ven: 7.244 — ABNORMAL LOW (ref 7.25–7.43)
pO2, Ven: 83 mmHg — ABNORMAL HIGH (ref 32–45)

## 2023-01-12 LAB — GLUCOSE, CAPILLARY
Glucose-Capillary: 283 mg/dL — ABNORMAL HIGH (ref 70–99)
Glucose-Capillary: 192 mg/dL — ABNORMAL HIGH (ref 70–99)
Glucose-Capillary: 155 mg/dL — ABNORMAL HIGH (ref 70–99)
Glucose-Capillary: 143 mg/dL — ABNORMAL HIGH (ref 70–99)
Glucose-Capillary: 104 mg/dL — ABNORMAL HIGH (ref 70–99)
Glucose-Capillary: 142 mg/dL — ABNORMAL HIGH (ref 70–99)
Glucose-Capillary: 170 mg/dL — ABNORMAL HIGH (ref 70–99)
Glucose-Capillary: 148 mg/dL — ABNORMAL HIGH (ref 70–99)

## 2023-01-12 LAB — CBG MONITORING, ED
Glucose-Capillary: 410 mg/dL — ABNORMAL HIGH (ref 70–99)
Glucose-Capillary: 453 mg/dL — ABNORMAL HIGH (ref 70–99)
Glucose-Capillary: 363 mg/dL — ABNORMAL HIGH (ref 70–99)
Glucose-Capillary: 450 mg/dL — ABNORMAL HIGH (ref 70–99)

## 2023-01-12 LAB — HEMOGLOBIN A1C
Hgb A1c MFr Bld: 11.8 % — ABNORMAL HIGH (ref 4.8–5.6)
Mean Plasma Glucose: 291.96 mg/dL

## 2023-01-12 LAB — BETA-HYDROXYBUTYRIC ACID
Beta-Hydroxybutyric Acid: >8 mmol/L — ABNORMAL HIGH (ref 0.05–0.27)
Beta-Hydroxybutyric Acid: 4.06 mmol/L — ABNORMAL HIGH (ref 0.05–0.27)
Beta-Hydroxybutyric Acid: 1.71 mmol/L — ABNORMAL HIGH (ref 0.05–0.27)

## 2023-01-12 LAB — URINALYSIS, MICROSCOPIC (REFLEX)

## 2023-01-12 LAB — RESP PANEL BY RT-PCR (RSV, FLU A&B, COVID)  RVPGX2
Influenza A by PCR: NEGATIVE
Influenza B by PCR: NEGATIVE
Resp Syncytial Virus by PCR: NEGATIVE
SARS Coronavirus 2 by RT PCR: NEGATIVE

## 2023-01-12 LAB — LACTIC ACID, PLASMA
Lactic Acid, Venous: 2 mmol/L (ref 0.5–1.9)
Lactic Acid, Venous: 1.1 mmol/L (ref 0.5–1.9)

## 2023-01-12 LAB — LIPASE, BLOOD: Lipase: 113 U/L — ABNORMAL HIGH (ref 11–51)

## 2023-01-12 MED ORDER — LORAZEPAM 1 MG PO TABS: 1.0000 mg | ORAL_TABLET | ORAL | Status: DC | PRN

## 2023-01-12 MED ORDER — ADULT MULTIVITAMIN W/MINERALS CH
1.0000 | ORAL_TABLET | Freq: Every day | ORAL | Status: DC
  Administered 2023-01-12 – 2023-01-15 (×4): 1 via ORAL
  Filled 2023-01-12 (×4): qty 1

## 2023-01-12 MED ORDER — ONDANSETRON HCL 4 MG/2ML IJ SOLN
4.0000 mg | Freq: Once | INTRAMUSCULAR | Status: AC
  Administered 2023-01-12: 4 mg via INTRAVENOUS
  Filled 2023-01-12: qty 2

## 2023-01-12 MED ORDER — LORAZEPAM 1 MG PO TABS
1.0000 mg | ORAL_TABLET | ORAL | Status: DC | PRN
  Administered 2023-01-13: 1 mg via ORAL
  Administered 2023-01-13 – 2023-01-14 (×3): 2 mg via ORAL
  Administered 2023-01-14: 1 mg via ORAL
  Administered 2023-01-14 – 2023-01-15 (×2): 2 mg via ORAL
  Filled 2023-01-12: qty 1
  Filled 2023-01-12 (×5): qty 2
  Filled 2023-01-12: qty 1

## 2023-01-12 MED ORDER — LACTATED RINGERS IV BOLUS
20.0000 mL/kg | Freq: Once | INTRAVENOUS | Status: AC
  Administered 2023-01-12: 1270 mL via INTRAVENOUS

## 2023-01-12 MED ORDER — THIAMINE MONONITRATE 100 MG PO TABS
100.0000 mg | ORAL_TABLET | Freq: Every day | ORAL | Status: DC
  Administered 2023-01-13 – 2023-01-15 (×3): 100 mg via ORAL
  Filled 2023-01-12 (×3): qty 1

## 2023-01-12 MED ORDER — LACTATED RINGERS IV SOLN: INTRAVENOUS | Status: DC

## 2023-01-12 MED ORDER — MORPHINE SULFATE (PF) 2 MG/ML IV SOLN
2.0000 mg | INTRAVENOUS | Status: DC | PRN
  Administered 2023-01-12 (×2): 2 mg via INTRAVENOUS
  Filled 2023-01-12 (×2): qty 1

## 2023-01-12 MED ORDER — ACETAMINOPHEN 325 MG PO TABS
650.0000 mg | ORAL_TABLET | Freq: Four times a day (QID) | ORAL | Status: DC | PRN
  Administered 2023-01-12 – 2023-01-13 (×3): 650 mg via ORAL
  Filled 2023-01-12 (×3): qty 2

## 2023-01-12 MED ORDER — CHLORHEXIDINE GLUCONATE CLOTH 2 % EX PADS
6.0000 | MEDICATED_PAD | Freq: Every day | CUTANEOUS | Status: DC
  Administered 2023-01-12 – 2023-01-14 (×2): 6 via TOPICAL

## 2023-01-12 MED ORDER — ORAL CARE MOUTH RINSE: 15.0000 mL | OROMUCOSAL | Status: DC | PRN

## 2023-01-12 MED ORDER — FOLIC ACID 1 MG PO TABS
1.0000 mg | ORAL_TABLET | Freq: Every day | ORAL | Status: DC
  Administered 2023-01-12 – 2023-01-15 (×4): 1 mg via ORAL
  Filled 2023-01-12 (×4): qty 1

## 2023-01-12 MED ORDER — ONDANSETRON HCL 4 MG PO TABS: 4.0000 mg | ORAL_TABLET | Freq: Four times a day (QID) | ORAL | Status: DC | PRN

## 2023-01-12 MED ORDER — ONDANSETRON HCL 4 MG/2ML IJ SOLN
4.0000 mg | Freq: Four times a day (QID) | INTRAMUSCULAR | Status: DC | PRN
  Administered 2023-01-12: 4 mg via INTRAVENOUS
  Filled 2023-01-12: qty 2

## 2023-01-12 MED ORDER — DEXTROSE 50 % IV SOLN: 0.0000 mL | INTRAVENOUS | Status: DC | PRN

## 2023-01-12 MED ORDER — THIAMINE HCL 100 MG/ML IJ SOLN
100.0000 mg | Freq: Every day | INTRAMUSCULAR | Status: DC
  Administered 2023-01-12: 100 mg via INTRAVENOUS
  Filled 2023-01-12 (×2): qty 2

## 2023-01-12 MED ORDER — ACETAMINOPHEN 650 MG RE SUPP: 650.0000 mg | Freq: Four times a day (QID) | RECTAL | Status: DC | PRN

## 2023-01-12 MED ORDER — DEXTROSE IN LACTATED RINGERS 5 % IV SOLN: INTRAVENOUS | Status: DC

## 2023-01-12 MED ORDER — MAGNESIUM SULFATE 2 GM/50ML IV SOLN
2.0000 g | Freq: Once | INTRAVENOUS | Status: AC
  Administered 2023-01-12: 2 g via INTRAVENOUS
  Filled 2023-01-12: qty 50

## 2023-01-12 MED ORDER — ENOXAPARIN SODIUM 40 MG/0.4ML IJ SOSY
40.0000 mg | PREFILLED_SYRINGE | INTRAMUSCULAR | Status: DC
  Administered 2023-01-12 – 2023-01-14 (×3): 40 mg via SUBCUTANEOUS
  Filled 2023-01-12 (×2): qty 0.4

## 2023-01-12 MED ORDER — POTASSIUM CHLORIDE 10 MEQ/100ML IV SOLN
10.0000 meq | INTRAVENOUS | Status: AC
  Administered 2023-01-12 (×2): 10 meq via INTRAVENOUS
  Filled 2023-01-12 (×2): qty 100

## 2023-01-12 MED ORDER — INSULIN REGULAR(HUMAN) IN NACL 100-0.9 UT/100ML-% IV SOLN
INTRAVENOUS | Status: DC
  Administered 2023-01-12: 13 [IU]/h via INTRAVENOUS
  Filled 2023-01-12: qty 100

## 2023-01-12 MED ORDER — LACTATED RINGERS IV BOLUS
1000.0000 mL | Freq: Once | INTRAVENOUS | Status: AC
  Administered 2023-01-12: 1000 mL via INTRAVENOUS

## 2023-01-12 NOTE — H&P (Signed)
History and Physical    Patient: Tom Anderson [id number] DOB: 03-13-1971 DOA: 01/12/2023 DOS: the patient was seen and examined on 01/12/2023 PCP: Pcp, No  Patient coming from: Home  Chief Complaint:  Chief Complaint  Patient presents with   Abdominal Pain   HPI: Tom Anderson is a 52 y.o. male with medical history significant of asthma, depression, type 1 diabetes, schizophrenia,  depression history of suicide ideation, tardive dyskinesia,malnutrition, history of AKI, Alcohol abuse, cannabis abuse, GERD without esophagitis, essential tremor who presented to the emergency department with abdominal pain,, multiple episodes of emesis and hyperglycemia.  He stated he has missed a few insulin doses.  No  diarrhea, constipation, melena or hematochezia. He denied fever, chills, rhinorrhea, sore throat or hemoptysis.  He has frequent wheezing.  No chest pain, palpitations, diaphoresis, PND, orthopnea or pitting edema of the lower extremities.  No flank pain, dysuria, frequency or hematuria.  No polyuria, polydipsia, polyphagia or blurred vision.   Lab work: CBC showed white count of 14.3, hemoglobin 15.5 g/dL platelets 168.  Urinalysis showed glucosuria more than 500, ketonuria of 80 and proteinuria of 100 mg/deciliter.  There was trace hemoglobin hemoglobin and small bilirubin.  Rare bacteria microscopic examination.  Negative for coronavirus, influenza and RSV PCR.  Lactic acid is 2.0 and 1.1 mmol/L.  Lipase 113 units/L.  CMP showed CO2 of 15 mmol/L with an anion gap of 22.  Bilirubin of 1.9, calcium 8.4 and glucose 425 mg/dL.  The rest of the CMP measurements were normal.  That the hydroxybutyric acid was more than 8.0 mmol/L.  ED course: Initial vital signs were temperature 98.3 F, pulse 122, respirations 24, BP 111/96 mmHg and O2 sat 100% on room air.  The patient received IV fluids, ondansetron 4 mg IVP x 2, KCl 10 mEq IVP x 2 and was started on an insulin infusion.   Review of Systems: As  mentioned in the history of present illness. All other systems reviewed and are negative. Past Medical History:  Diagnosis Date   Asthma    Depression    Diabetes mellitus without complication ([place])    Schizophrenia ([place])    Past Surgical History:  Procedure Laterality Date   HERNIA REPAIR     Social History:  reports that he has been smoking cigarettes. He has been smoking an average of 2.50 packs per day. He has never used smokeless tobacco. He reports current alcohol use. He reports current drug use. Drug: Marijuana.  Allergies  Allergen Reactions   Nitrous Oxide Nausea And Vomiting    Family History  Problem Relation Age of Onset   Diabetes Maternal Grandfather     Prior to Admission medications   Medication Sig Start Date End Date Taking? Authorizing Provider  blood glucose meter kit and supplies Dispense based on patient and insurance preference. Use up to four times daily as directed. (FOR ICD-10 E10.9, E11.9). 10/07/20   [name], Jimmy Picket, MD  insulin aspart (NOVOLOG) 100 UNIT/ML FlexPen For glucose 121 to 150 use 1 unit, for 151 to 200 use 2 units, for 201 to 250 use 3 units, for 251 to 300 use 5 units, for 301 to 350 use 7 units for 351 or greater use 9 units. 10/07/20   [name], Jimmy Picket, MD  insulin detemir (LEVEMIR) 100 UNIT/ML FlexPen Inject 15 Units into the skin daily. 10/07/20   [name], Jimmy Picket, MD  Insulin Syringes, Disposable, U-100 0.3 ML MISC For insulin use. 10/07/20   [name], Jimmy Picket, MD  ondansetron (ZOFRAN) 4 MG tablet Take 1 tablet (4 mg total) by mouth every 6 (six) hours as needed for nausea or vomiting. 12/17/22   Azucena Cecil, PA-C  OVER THE COUNTER MEDICATION Take 1 tablet by mouth daily. Super Beta Prostate supplement    [provider]  VENTOLIN HFA 108 (90 Base) MCG/ACT inhaler INHALE 1 PUFF BY MOUTH INTO THE LUNGS EVERY 4 HOURS AS NEEDED FOR WHEEZING 11/20/22   [provider]    Physical  Exam: Vitals:   01/12/23 1338 01/12/23 1400 01/12/23 1537 01/12/23 1543  BP: 128/85 (!) 131/92  (!) 98/57  Pulse: (!) 115 (!) 112  (!) 101  Resp: 19 (!) 21  19  Temp:  97.9 F (36.6 C) 98.1 F (36.7 C)   TempSrc:   Oral   SpO2: 100% 100%  100%  Weight:   55.3 kg   Height:   5' 9"$  (1.753 m)    Physical Exam Vitals and nursing note reviewed.  Constitutional:      General: He is awake. He is not in acute distress.    Appearance: He is well-developed.  HENT:     Head: Normocephalic.     Nose: No rhinorrhea.     Mouth/Throat:     Mouth: Mucous membranes are dry.  Eyes:     General: No scleral icterus.    Pupils: Pupils are equal, round, and reactive to light.  Neck:     Vascular: No JVD.  Cardiovascular:     Rate and Rhythm: Regular rhythm. Tachycardia present.     Heart sounds: S1 normal and S2 normal.  Pulmonary:     Effort: Pulmonary effort is normal.     Breath sounds: Normal breath sounds. No wheezing, rhonchi or rales.  Abdominal:     Palpations: Abdomen is soft.     Tenderness: There is abdominal tenderness in the epigastric area. There is no right CVA tenderness, left CVA tenderness, guarding or rebound.  Musculoskeletal:     Cervical back: Neck supple.     Right lower leg: No edema.     Left lower leg: No edema.  Skin:    General: Skin is warm and dry.  Neurological:     General: No focal deficit present.     Mental Status: He is alert and oriented to person, place, and time.  Psychiatric:        Mood and Affect: Mood normal.        Behavior: Behavior normal. Behavior is cooperative.   Data Reviewed:  Results are pending, will review when available.  Assessment and Plan: Principal Problem:   Type 1 diabetes mellitus with hyperglycemia ([place]) Presenting with:   DKA (diabetic ketoacidosis) ([place]) Observation/stepdown. Keep NPO. Continue IV fluids. Continue insulin infusion. Monitor CBG closely. BMP every 4 hours. BHA every 8 hours. Replace  electrolytes as needed. Consult diabetes coordinator. Transition to SQ insulin per Endo tool.  Active Problems:   Schizophrenia ([place])   Major depression Deny HI or SI. Currently not on medication. Has not followed with behavioral health in a while. Will consult psychiatry for evaluation.    Tardive dyskinesia Essential tremor May benefit from Cogentin. May benefit from primidone therapy. Psych consult requested.    Cannabis abuse Cessation advised.    Alcohol abuse Drinks 42 to 84 ounces of beer daily. CIWA protocol with lorazepam ordered preemptively. Magnesium sulfate 2 g IVPB. Folic acid, MVI and thiamine supplementation.    Cigarette nicotine dependence without complication Smoking cessation  advised. Declined nicotine replacement therapy.    GERD without esophagitis H2 blocker or PPI as needed.    Advance Care Planning:   Code Status: Full Code   Consults:   Family Communication:   Severity of Illness: The appropriate patient status for this patient is INPATIENT. Inpatient status is judged to be reasonable and necessary in order to provide the required intensity of service to ensure the patient's safety. The patient's presenting symptoms, physical exam findings, and initial radiographic and laboratory data in the context of their chronic comorbidities is felt to place them at high risk for further clinical deterioration. Furthermore, it is not anticipated that the patient will be medically stable for discharge from the hospital within 2 midnights of admission.   * I certify that at the point of admission it is my clinical judgment that the patient will require inpatient hospital care spanning beyond 2 midnights from the point of admission due to high intensity of service, high risk for further deterioration and high frequency of surveillance required.*  Author: Reubin Milan, MD 01/12/2023 4:00 PM  For on call review [email or web address].   This document was  prepared using Dragon voice recognition software and may contain some unintended transcription errors.

## 2023-01-12 NOTE — ED Notes (Signed)
Light green tube redrawn and sent to lab

## 2023-01-12 NOTE — ED Triage Notes (Signed)
Abdominal pain started last night , hyperglycemia , type I diabetes . Took his insulin last night he said .

## 2023-01-12 NOTE — ED Provider Notes (Signed)
[facility] Provider Note   CSN: [id number] Arrival date & time: 01/12/23  [id number]     History  Chief Complaint  Patient presents with   Abdominal Pain    Tom Anderson is a 52 y.o. male.  HPI     52yo male with history of asthma, depression, DM, who presents with concern for abdominal pain, nausea, vomiting.  Abdominal pain started last night, glucose has been elevated. Not feeling well for last few days with nausea, developed vomiting, not keeping things down. No chest pain. Has some dyspnea. No fever, no dysuria. Has noted noted urinary frequency, does not thirst.  No diarrhea or constipation. Abdominal pain is epigastric and diffuse.      Past Medical History:  Diagnosis Date   Asthma    Depression    Diabetes mellitus without complication ([place])    Schizophrenia ([place])      Home Medications Prior to Admission medications   Medication Sig Start Date End Date Taking? Authorizing Provider  blood glucose meter kit and supplies Dispense based on patient and insurance preference. Use up to four times daily as directed. (FOR ICD-10 E10.9, E11.9). 10/07/20   [name], Jimmy Picket, MD  insulin aspart (NOVOLOG) 100 UNIT/ML FlexPen For glucose 121 to 150 use 1 unit, for 151 to 200 use 2 units, for 201 to 250 use 3 units, for 251 to 300 use 5 units, for 301 to 350 use 7 units for 351 or greater use 9 units. 10/07/20   [name], Jimmy Picket, MD  insulin detemir (LEVEMIR) 100 UNIT/ML FlexPen Inject 15 Units into the skin daily. 10/07/20   [name], Jimmy Picket, MD  Insulin Syringes, Disposable, U-100 0.3 ML MISC For insulin use. 10/07/20   [name], Jimmy Picket, MD  ondansetron (ZOFRAN) 4 MG tablet Take 1 tablet (4 mg total) by mouth every 6 (six) hours as needed for nausea or vomiting. 12/17/22   Azucena Cecil, PA-C  OVER THE COUNTER MEDICATION Take 1 tablet by mouth daily. Super Beta Prostate supplement    [provider]   VENTOLIN HFA 108 (90 Base) MCG/ACT inhaler INHALE 1 PUFF BY MOUTH INTO THE LUNGS EVERY 4 HOURS AS NEEDED FOR WHEEZING 11/20/22   [provider]      Allergies    Nitrous oxide    Review of Systems   Review of Systems  Physical Exam Updated Vital Signs BP 120/79   Pulse 86   Temp 98 F (36.7 C) (Oral)   Resp (!) 31   Ht 5' 9"$  (1.753 m)   Wt 55.3 kg   SpO2 100%   BMI 18.00 kg/m  Physical Exam Vitals and nursing note reviewed.  Constitutional:      General: He is not in acute distress.    Appearance: He is well-developed. He is not diaphoretic.  HENT:     Head: Normocephalic and atraumatic.  Eyes:     Conjunctiva/sclera: Conjunctivae normal.  Cardiovascular:     Rate and Rhythm: Normal rate and regular rhythm.     Heart sounds: Normal heart sounds. No murmur heard.    No friction rub. No gallop.  Pulmonary:     Effort: Pulmonary effort is normal. Tachypnea present. No respiratory distress.     Breath sounds: Normal breath sounds. No wheezing or rales.  Abdominal:     General: There is no distension.     Palpations: Abdomen is soft.     Tenderness: There is abdominal tenderness (mild  diffuse). There is no guarding.  Musculoskeletal:     Cervical back: Normal range of motion.  Skin:    General: Skin is warm and dry.  Neurological:     Mental Status: He is alert and oriented to person, place, and time.     ED Results / Procedures / Treatments   Labs (all labs ordered are listed, but only abnormal results are displayed) Labs Reviewed  CBC WITH DIFFERENTIAL/PLATELET - Abnormal; Notable for the following components:      Result Value   WBC 14.3 (*)    RDW 11.1 (*)    Neutro Abs 11.3 (*)    Abs Immature Granulocytes 0.14 (*)    All other components within normal limits  BETA-HYDROXYBUTYRIC ACID - Abnormal; Notable for the following components:   Beta-Hydroxybutyric Acid >8.00 (*)    All other components within normal limits  LACTIC ACID, PLASMA -  Abnormal; Notable for the following components:   Lactic Acid, Venous 2.0 (*)    All other components within normal limits  URINALYSIS, ROUTINE W REFLEX MICROSCOPIC - Abnormal; Notable for the following components:   Glucose, UA >=500 (*)    Hgb urine dipstick TRACE (*)    Bilirubin Urine SMALL (*)    Ketones, ur >=80 (*)    Protein, ur 100 (*)    All other components within normal limits  COMPREHENSIVE METABOLIC PANEL - Abnormal; Notable for the following components:   Sodium 126 (*)    Chloride 89 (*)    CO2 15 (*)    Glucose, Bld 425 (*)    Calcium 8.4 (*)    Total Bilirubin 1.9 (*)    Anion gap 22 (*)    All other components within normal limits  LIPASE, BLOOD - Abnormal; Notable for the following components:   Lipase 113 (*)    All other components within normal limits  URINALYSIS, MICROSCOPIC (REFLEX) - Abnormal; Notable for the following components:   Bacteria, UA RARE (*)    All other components within normal limits  BASIC METABOLIC PANEL - Abnormal; Notable for the following components:   Sodium 130 (*)    CO2 18 (*)    Glucose, Bld 178 (*)    Calcium 8.5 (*)    All other components within normal limits  BASIC METABOLIC PANEL - Abnormal; Notable for the following components:   Sodium 132 (*)    Potassium 3.4 (*)    Glucose, Bld 117 (*)    Calcium 8.8 (*)    All other components within normal limits  BETA-HYDROXYBUTYRIC ACID - Abnormal; Notable for the following components:   Beta-Hydroxybutyric Acid 4.06 (*)    All other components within normal limits  HEMOGLOBIN A1C - Abnormal; Notable for the following components:   Hgb A1c MFr Bld 11.8 (*)    All other components within normal limits  GLUCOSE, CAPILLARY - Abnormal; Notable for the following components:   Glucose-Capillary 192 (*)    All other components within normal limits  GLUCOSE, CAPILLARY - Abnormal; Notable for the following components:   Glucose-Capillary 155 (*)    All other components within  normal limits  GLUCOSE, CAPILLARY - Abnormal; Notable for the following components:   Glucose-Capillary 143 (*)    All other components within normal limits  GLUCOSE, CAPILLARY - Abnormal; Notable for the following components:   Glucose-Capillary 104 (*)    All other components within normal limits  GLUCOSE, CAPILLARY - Abnormal; Notable for the following components:   Glucose-Capillary 283 (*)  All other components within normal limits  GLUCOSE, CAPILLARY - Abnormal; Notable for the following components:   Glucose-Capillary 142 (*)    All other components within normal limits  BETA-HYDROXYBUTYRIC ACID - Abnormal; Notable for the following components:   Beta-Hydroxybutyric Acid 1.71 (*)    All other components within normal limits  GLUCOSE, CAPILLARY - Abnormal; Notable for the following components:   Glucose-Capillary 170 (*)    All other components within normal limits  GLUCOSE, CAPILLARY - Abnormal; Notable for the following components:   Glucose-Capillary 148 (*)    All other components within normal limits  CBG MONITORING, ED - Abnormal; Notable for the following components:   Glucose-Capillary 410 (*)    All other components within normal limits  I-STAT VENOUS BLOOD GAS, ED - Abnormal; Notable for the following components:   pCO2, Ven 33.7 (*)    pO2, Ven 26 (*)    Bicarbonate 16.2 (*)    TCO2 17 (*)    Acid-base deficit 9.0 (*)    Sodium 120 (*)    Potassium >8.5 (*)    Calcium, Ion 1.01 (*)    Hemoglobin 17.3 (*)    All other components within normal limits  CBG MONITORING, ED - Abnormal; Notable for the following components:   Glucose-Capillary 453 (*)    All other components within normal limits  I-STAT VENOUS BLOOD GAS, ED - Abnormal; Notable for the following components:   pH, Ven 7.244 (*)    pCO2, Ven 19.2 (*)    pO2, Ven 83 (*)    Bicarbonate 8.3 (*)    TCO2 9 (*)    Acid-base deficit 17.0 (*)    Sodium 126 (*)    All other components within normal  limits  CBG MONITORING, ED - Abnormal; Notable for the following components:   Glucose-Capillary 450 (*)    All other components within normal limits  CBG MONITORING, ED - Abnormal; Notable for the following components:   Glucose-Capillary 363 (*)    All other components within normal limits  RESP PANEL BY RT-PCR (RSV, FLU A&B, COVID)  RVPGX2  MRSA NEXT GEN BY PCR, NASAL  LACTIC ACID, PLASMA  BASIC METABOLIC PANEL  BASIC METABOLIC PANEL  BETA-HYDROXYBUTYRIC ACID  HIV ANTIBODY (ROUTINE TESTING W REFLEX)  CBC  COMPREHENSIVE METABOLIC PANEL  BASIC METABOLIC PANEL  BETA-HYDROXYBUTYRIC ACID    EKG EKG Interpretation  Date/Time:  Tuesday January 12 2023 09:05:06 EST Ventricular Rate:  104 PR Interval:  124 QRS Duration: 87 QT Interval:  340 QTC Calculation: 448 R Axis:   123 Text Interpretation: Sinus tachycardia Left posterior fascicular block No significant change since last tracing Confirmed by Gareth Morgan [id number]) on 01/12/2023 9:49:03 AM  Radiology No results found.  Procedures Procedures    Medications Ordered in ED Medications  insulin regular, human (MYXREDLIN) 100 units/ 100 mL infusion (0 Units/hr Intravenous Stopped 01/12/23 1939)  lactated ringers infusion (0 mLs Intravenous Stopped 01/12/23 1633)  dextrose 5 % in lactated ringers infusion ( Intravenous Infusion Verify 01/12/23 1956)  dextrose 50 % solution 0-50 mL (has no administration in time range)  enoxaparin (LOVENOX) injection 40 mg (40 mg Subcutaneous Given 01/12/23 2237)  acetaminophen (TYLENOL) tablet 650 mg (650 mg Oral Given 01/12/23 1639)    Or  acetaminophen (TYLENOL) suppository 650 mg ( Rectal See Alternative 01/12/23 1639)  ondansetron (ZOFRAN) tablet 4 mg ( Oral See Alternative 01/12/23 1757)    Or  ondansetron (ZOFRAN) injection 4 mg (4 mg Intravenous Given 01/12/23  1757)  Oral care mouth rinse (has no administration in time range)  Chlorhexidine Gluconate Cloth 2 % PADS 6 each (6 each Topical  Given 01/12/23 1635)  morphine (PF) 2 MG/ML injection 2 mg (2 mg Intravenous Given 01/12/23 2044)  LORazepam (ATIVAN) tablet 1-4 mg (has no administration in time range)    Or  LORazepam (ATIVAN) tablet 1 mg (has no administration in time range)  thiamine (VITAMIN B1) tablet 100 mg ( Oral See Alternative 01/12/23 1946)    Or  thiamine (VITAMIN B1) injection 100 mg (100 mg Intravenous Given [id number])  folic acid (FOLVITE) tablet 1 mg (1 mg Oral Given 01/12/23 1947)  multivitamin with minerals tablet 1 tablet (1 tablet Oral Given 01/12/23 1947)  lactated ringers bolus 1,000 mL (0 mLs Intravenous Stopped 01/12/23 1123)  ondansetron (ZOFRAN) injection 4 mg (4 mg Intravenous Given 01/12/23 0927)  lactated ringers bolus 1,270 mL (0 mLs Intravenous Stopped 01/12/23 1427)  potassium chloride 10 mEq in 100 mL IVPB (0 mEq Intravenous Stopped 01/12/23 1408)  ondansetron (ZOFRAN) injection 4 mg (4 mg Intravenous Given 01/12/23 1343)  magnesium sulfate IVPB 2 g 50 mL (0 g Intravenous Stopped 01/12/23 2116)    ED Course/ Medical Decision Making/ A&P                               52yo male with history of asthma, depression, DM, who presents with concern for abdominal pain, nausea, vomiting.  Abdominal exam nonfocal, nondistended and clinically low suspicion for cholecystitis, appendicitis, SBO, perforated viscous, diverticulitis. Labs show mild lipase elevation. No transaminitis.   Initial istat labs hemolyzed. Labs completed and consistent with DKA with hyperglycemia, anion gap 22, bicarb 15.   Started on insluin gtt. Given IV fluid for hydration.  Unclear trigger for episode at this time. Suspect leukocytosis in setting of DKA< does not have symptoms to suggest other bacterial infection.  Admitted for further care.        Final Clinical Impression(s) / ED Diagnoses Final diagnoses:  Generalized abdominal pain  Diabetic ketoacidosis without coma associated with type 1 diabetes mellitus [facility])     Rx / DC Orders ED Discharge Orders     None         Gareth Morgan, MD 01/12/23 2253

## 2023-01-12 NOTE — Progress Notes (Signed)
Plan of Care Note for accepted transfer   Patient: Tom Anderson MRN: [id number]   [place]: 01/12/2023  Facility requesting transfer: [facility].  Requesting Provider: Gareth Morgan, MD Reason for transfer: DKA. Facility course:   Abdominal pain and hyperglycemia in the setting of type 1 diabetes.  Beta-hydroxybutyric acid [id number] (Abnormal)   Collected: 01/12/23 1027   Updated: 01/12/23 1312   Specimen Type: Blood   Specimen Source: Vein    Beta-Hydroxybutyric Acid >8.00 High  mmol/L  I-Stat venous blood gas, ED [id number] (Abnormal)   Collected: 01/12/23 1254   Updated: 01/12/23 1256   Specimen Type: Blood    pH, Ven 7.244 Low    pCO2, Ven 19.2 Low Panic  mmHg   pO2, Ven 83 High  mmHg   Bicarbonate 8.3 Low  mmol/L   TCO2 9 Low  mmol/L   O2 Saturation 95 %   Acid-base deficit 17.0 High  mmol/L   Sodium 126 Low  mmol/L   Potassium 4.2 mmol/L   Calcium, Ion 1.16 mmol/L   HCT 45.0 %   Hemoglobin 15.3 g/dL   Patient temperature 98.3 F   Collection site IV start   Drawn by Nurse   Sample type VENOUS   Comment NOTIFIED PHYSICIAN  CBG monitoring, ED [id number] (Abnormal)   Collected: 01/12/23 1249   Updated: 01/12/23 1250   Specimen Type: Blood    Glucose-Capillary 453 High  mg/dL  Comprehensive metabolic panel [id number] (Abnormal)   Collected: 01/12/23 1027   Updated: 01/12/23 1212   Specimen Type: Blood   Specimen Source: Vein    Sodium 126 Low  mmol/L   Potassium 4.3 mmol/L   Chloride 89 Low  mmol/L   CO2 15 Low  mmol/L   Glucose, Bld 425 High  mg/dL   BUN 19 mg/dL   Creatinine, Ser 1.23 mg/dL   Calcium 8.4 Low  mg/dL   Total Protein 6.8 g/dL   Albumin 4.0 g/dL   AST 21 U/L   ALT 23 U/L   Alkaline Phosphatase 123 U/L   Total Bilirubin 1.9 High  mg/dL   GFR, Estimated >60 mL/min   Anion gap 22 High   Lipase, blood [id number] (Abnormal)   Collected: 01/12/23 1027   Updated: 01/12/23 1212   Specimen Type: Blood   Specimen Source: Vein     Lipase 113 High  U/L  Lactic acid, plasma [id number]   Collected: 01/12/23 1130   Updated: 01/12/23 1149   Specimen Type: Blood   Specimen Source: Vein    Lactic Acid, Venous 1.1 mmol/L  Urinalysis, Microscopic (reflex) [id number] (Abnormal)   Collected: 01/12/23 1027   Updated: 01/12/23 1148    RBC / HPF 0-5 RBC/hpf   WBC, UA 0-5 WBC/hpf   Bacteria, UA RARE Abnormal    Squamous Epithelial / HPF 0-5 /HPF  Urinalysis, Routine w reflex microscopic -Urine, Clean Catch [id number] (Abnormal)   Collected: 01/12/23 1027   Updated: 01/12/23 1148   Specimen Source: Urine, Clean Catch    Color, Urine YELLOW   APPearance CLEAR   Specific Gravity, Urine >=1.030   pH 5.0   Glucose, UA >=500 Abnormal  mg/dL   Hgb urine dipstick TRACE Abnormal    Bilirubin Urine SMALL Abnormal    Ketones, ur >=80 Abnormal  mg/dL   Protein, ur 100 Abnormal  mg/dL   Nitrite NEGATIVE   Leukocytes,Ua NEGATIVE  Resp panel by RT-PCR (RSV, Flu A&B, Covid) Anterior Nasal Swab [id number]   Collected: 01/12/23 1027   Updated:  01/12/23 1137   Specimen Source: Anterior Nasal Swab    SARS Coronavirus 2 by RT PCR NEGATIVE   Influenza A by PCR NEGATIVE   Influenza B by PCR NEGATIVE   Resp Syncytial Virus by PCR NEGATIVE  Lactic acid, plasma [id number] (Abnormal)   Collected: 01/12/23 0925   Updated: 01/12/23 1022   Specimen Type: Blood   Specimen Source: Vein    Lactic Acid, Venous 2.0 High Panic  mmol/L  CBC with Differential [id number] (Abnormal)   Collected: 01/12/23 0925   Updated: 01/12/23 0940   Specimen Type: Blood   Specimen Source: Vein    WBC 14.3 High  K/uL   RBC 4.61 MIL/uL   Hemoglobin 15.5 g/dL   HCT 43.5 %   MCV 94.4 fL   MCH 33.6 pg   MCHC 35.6 g/dL   RDW 11.1 Low  %   Platelets 168 K/uL   nRBC 0.0 %   Neutrophils Relative % 79 %   Neutro Abs 11.3 High  K/uL   Lymphocytes Relative 14 %   Lymphs Abs 1.9 K/uL   Monocytes Relative 6 %   Monocytes Absolute 0.9 K/uL   Eosinophils  Relative 0 %   Eosinophils Absolute 0.0 K/uL   Basophils Relative 0 %   Basophils Absolute 0.1 K/uL   Immature Granulocytes 1 %   Abs Immature Granulocytes 0.14 High  K/uL  I-Stat venous blood gas, ([facility], [facility], [facility]) [id number] (Abnormal)   Collected: 01/12/23 0927   Updated: 01/12/23 0928   Specimen Type: Blood    pH, Ven 7.290   pCO2, Ven 33.7 Low  mmHg   pO2, Ven 26 Low Panic  mmHg   Bicarbonate 16.2 Low  mmol/L   TCO2 17 Low  mmol/L   O2 Saturation 42 %   Acid-base deficit 9.0 High  mmol/L   Sodium 120 Low  mmol/L   Potassium >8.5 High Panic  mmol/L   Calcium, Ion 1.01 Low  mmol/L   HCT 51.0 %   Hemoglobin 17.3 High  g/dL   Patient temperature 98.3 F   Collection site IV start   Drawn by Nurse   Sample type VENOUS   Comment NOTIFIED PHYSICIAN  CBG monitoring, ED [id number] (Abnormal)   Collected: 01/12/23 0848   Updated: 01/12/23 0849    Glucose-Capillary 410 High  mg/dL   Comment 1 Notify RN   Started on DKA treatment protocol.  Plan of care: The patient is accepted for admission to [facility] unit, at [facility]..   Author: Reubin Milan, MD 01/12/2023  Check [email or web address] for on-call coverage.  Nursing staff, Please call [place] number on Amion as soon as patient's arrival, so appropriate admitting provider can evaluate the pt.

## 2023-01-12 NOTE — ED Notes (Signed)
Care link in to transfer 

## 2023-01-12 NOTE — ED Notes (Signed)
Per End[facility], pt insulin gtt to stay infusing at 13units/hr.

## 2023-01-13 DIAGNOSIS — Z888 Allergy status to other drugs, medicaments and biological substances status: Secondary | ICD-10-CM | POA: Diagnosis not present

## 2023-01-13 DIAGNOSIS — F1210 Cannabis abuse, uncomplicated: Secondary | ICD-10-CM | POA: Diagnosis not present

## 2023-01-13 DIAGNOSIS — F17210 Nicotine dependence, cigarettes, uncomplicated: Secondary | ICD-10-CM | POA: Diagnosis present

## 2023-01-13 DIAGNOSIS — Z833 Family history of diabetes mellitus: Secondary | ICD-10-CM | POA: Diagnosis not present

## 2023-01-13 DIAGNOSIS — F329 Major depressive disorder, single episode, unspecified: Secondary | ICD-10-CM | POA: Diagnosis not present

## 2023-01-13 DIAGNOSIS — Z794 Long term (current) use of insulin: Secondary | ICD-10-CM | POA: Diagnosis not present

## 2023-01-13 DIAGNOSIS — J45909 Unspecified asthma, uncomplicated: Secondary | ICD-10-CM | POA: Diagnosis present

## 2023-01-13 DIAGNOSIS — R809 Proteinuria, unspecified: Secondary | ICD-10-CM | POA: Diagnosis present

## 2023-01-13 DIAGNOSIS — R4701 Aphasia: Secondary | ICD-10-CM | POA: Diagnosis present

## 2023-01-13 DIAGNOSIS — Z1152 Encounter for screening for COVID-19: Secondary | ICD-10-CM | POA: Diagnosis not present

## 2023-01-13 DIAGNOSIS — F819 Developmental disorder of scholastic skills, unspecified: Secondary | ICD-10-CM | POA: Diagnosis present

## 2023-01-13 DIAGNOSIS — F1910 Other psychoactive substance abuse, uncomplicated: Secondary | ICD-10-CM | POA: Diagnosis present

## 2023-01-13 DIAGNOSIS — F1010 Alcohol abuse, uncomplicated: Secondary | ICD-10-CM

## 2023-01-13 DIAGNOSIS — F209 Schizophrenia, unspecified: Secondary | ICD-10-CM | POA: Diagnosis not present

## 2023-01-13 DIAGNOSIS — K219 Gastro-esophageal reflux disease without esophagitis: Secondary | ICD-10-CM | POA: Diagnosis present

## 2023-01-13 DIAGNOSIS — E876 Hypokalemia: Secondary | ICD-10-CM | POA: Diagnosis present

## 2023-01-13 DIAGNOSIS — Z91199 Patient's noncompliance with other medical treatment and regimen due to unspecified reason: Secondary | ICD-10-CM | POA: Diagnosis not present

## 2023-01-13 DIAGNOSIS — Z79899 Other long term (current) drug therapy: Secondary | ICD-10-CM | POA: Diagnosis not present

## 2023-01-13 DIAGNOSIS — R350 Frequency of micturition: Secondary | ICD-10-CM | POA: Diagnosis present

## 2023-01-13 DIAGNOSIS — G2401 Drug induced subacute dyskinesia: Secondary | ICD-10-CM | POA: Diagnosis present

## 2023-01-13 DIAGNOSIS — Z9151 Personal history of suicidal behavior: Secondary | ICD-10-CM | POA: Diagnosis not present

## 2023-01-13 DIAGNOSIS — E1010 Type 1 diabetes mellitus with ketoacidosis without coma: Secondary | ICD-10-CM | POA: Diagnosis present

## 2023-01-13 DIAGNOSIS — E1065 Type 1 diabetes mellitus with hyperglycemia: Secondary | ICD-10-CM

## 2023-01-13 DIAGNOSIS — G250 Essential tremor: Secondary | ICD-10-CM | POA: Diagnosis present

## 2023-01-13 LAB — COMPREHENSIVE METABOLIC PANEL
ALT: 21 U/L (ref 0–44)
AST: 21 U/L (ref 15–41)
Albumin: 3.5 g/dL (ref 3.5–5.0)
Alkaline Phosphatase: 89 U/L (ref 38–126)
Anion gap: 10 (ref 5–15)
BUN: 13 mg/dL (ref 6–20)
CO2: 24 mmol/L (ref 22–32)
Calcium: 8.3 mg/dL — ABNORMAL LOW (ref 8.9–10.3)
Chloride: 97 mmol/L — ABNORMAL LOW (ref 98–111)
Creatinine, Ser: 0.99 mg/dL (ref 0.61–1.24)
GFR, Estimated: >60 mL/min (ref >60)
Glucose, Bld: 131 mg/dL — ABNORMAL HIGH (ref 70–99)
Potassium: 3.2 mmol/L — ABNORMAL LOW (ref 3.5–5.1)
Sodium: 131 mmol/L — ABNORMAL LOW (ref 135–145)
Total Bilirubin: 1 mg/dL (ref 0.3–1.2)
Total Protein: 5.9 g/dL — ABNORMAL LOW (ref 6.5–8.1)

## 2023-01-13 LAB — CBC
HCT: 35.5 % — ABNORMAL LOW (ref 39.0–52.0)
Hemoglobin: 12.5 g/dL — ABNORMAL LOW (ref 13.0–17.0)
MCH: 33.7 pg (ref 26.0–34.0)
MCHC: 35.2 g/dL (ref 30.0–36.0)
MCV: 95.7 fL (ref 80.0–100.0)
Platelets: 322 10*3/uL (ref 150–400)
RBC: 3.71 MIL/uL — ABNORMAL LOW (ref 4.22–5.81)
RDW: 11 % — ABNORMAL LOW (ref 11.5–15.5)
WBC: 9.4 10*3/uL (ref 4.0–10.5)
nRBC: 0 % (ref 0.0–0.2)

## 2023-01-13 LAB — RAPID URINE DRUG SCREEN, HOSP PERFORMED
Amphetamines: NOT DETECTED
Barbiturates: NOT DETECTED
Benzodiazepines: NOT DETECTED
Cocaine: NOT DETECTED
Opiates: POSITIVE — AB
Tetrahydrocannabinol: POSITIVE — AB

## 2023-01-13 LAB — GLUCOSE, CAPILLARY
Glucose-Capillary: 111 mg/dL — ABNORMAL HIGH (ref 70–99)
Glucose-Capillary: 120 mg/dL — ABNORMAL HIGH (ref 70–99)
Glucose-Capillary: 128 mg/dL — ABNORMAL HIGH (ref 70–99)
Glucose-Capillary: 129 mg/dL — ABNORMAL HIGH (ref 70–99)
Glucose-Capillary: 134 mg/dL — ABNORMAL HIGH (ref 70–99)
Glucose-Capillary: 146 mg/dL — ABNORMAL HIGH (ref 70–99)
Glucose-Capillary: 152 mg/dL — ABNORMAL HIGH (ref 70–99)
Glucose-Capillary: 166 mg/dL — ABNORMAL HIGH (ref 70–99)
Glucose-Capillary: 169 mg/dL — ABNORMAL HIGH (ref 70–99)
Glucose-Capillary: 174 mg/dL — ABNORMAL HIGH (ref 70–99)
Glucose-Capillary: 185 mg/dL — ABNORMAL HIGH (ref 70–99)
Glucose-Capillary: 202 mg/dL — ABNORMAL HIGH (ref 70–99)
Glucose-Capillary: 214 mg/dL — ABNORMAL HIGH (ref 70–99)
Glucose-Capillary: 253 mg/dL — ABNORMAL HIGH (ref 70–99)
Glucose-Capillary: 82 mg/dL (ref 70–99)

## 2023-01-13 LAB — BASIC METABOLIC PANEL
Anion gap: 13 (ref 5–15)
Anion gap: 9 (ref 5–15)
BUN: 13 mg/dL (ref 6–20)
BUN: 12 mg/dL (ref 6–20)
CO2: 23 mmol/L (ref 22–32)
CO2: 26 mmol/L (ref 22–32)
Calcium: 8.4 mg/dL — ABNORMAL LOW (ref 8.9–10.3)
Calcium: 8.5 mg/dL — ABNORMAL LOW (ref 8.9–10.3)
Chloride: 97 mmol/L — ABNORMAL LOW (ref 98–111)
Chloride: 99 mmol/L (ref 98–111)
Creatinine, Ser: 0.93 mg/dL (ref 0.61–1.24)
Creatinine, Ser: 0.83 mg/dL (ref 0.61–1.24)
GFR, Estimated: >60 mL/min (ref >60)
GFR, Estimated: >60 mL/min (ref >60)
Glucose, Bld: 114 mg/dL — ABNORMAL HIGH (ref 70–99)
Glucose, Bld: 109 mg/dL — ABNORMAL HIGH (ref 70–99)
Potassium: 3.1 mmol/L — ABNORMAL LOW (ref 3.5–5.1)
Potassium: 3.2 mmol/L — ABNORMAL LOW (ref 3.5–5.1)
Sodium: 133 mmol/L — ABNORMAL LOW (ref 135–145)
Sodium: 134 mmol/L — ABNORMAL LOW (ref 135–145)

## 2023-01-13 LAB — BETA-HYDROXYBUTYRIC ACID
Beta-Hydroxybutyric Acid: 1.45 mmol/L — ABNORMAL HIGH (ref 0.05–0.27)
Beta-Hydroxybutyric Acid: 1.38 mmol/L — ABNORMAL HIGH (ref 0.05–0.27)
Beta-Hydroxybutyric Acid: 0.66 mmol/L — ABNORMAL HIGH (ref 0.05–0.27)

## 2023-01-13 LAB — ETHANOL: Alcohol, Ethyl (B): <10 mg/dL (ref <10)

## 2023-01-13 LAB — HIV ANTIBODY (ROUTINE TESTING W REFLEX): HIV Screen 4th Generation wRfx: NONREACTIVE

## 2023-01-13 LAB — MAGNESIUM: Magnesium: 2.2 mg/dL (ref 1.7–2.4)

## 2023-01-13 MED ORDER — POTASSIUM CHLORIDE CRYS ER 20 MEQ PO TBCR
40.0000 meq | EXTENDED_RELEASE_TABLET | Freq: Once | ORAL | Status: AC
  Administered 2023-01-13: 40 meq via ORAL
  Filled 2023-01-13: qty 2

## 2023-01-13 MED ORDER — GLUCERNA SHAKE PO LIQD
237.0000 mL | Freq: Two times a day (BID) | ORAL | Status: DC
  Administered 2023-01-14 – 2023-01-15 (×3): 237 mL via ORAL
  Filled 2023-01-13 (×4): qty 237

## 2023-01-13 MED ORDER — INSULIN ASPART 100 UNIT/ML IJ SOLN
0.0000 [IU] | INTRAMUSCULAR | Status: DC
  Administered 2023-01-13: 8 [IU] via SUBCUTANEOUS
  Administered 2023-01-13: 5 [IU] via SUBCUTANEOUS
  Administered 2023-01-13: 6 [IU] via SUBCUTANEOUS
  Administered 2023-01-14: 11 [IU] via SUBCUTANEOUS
  Administered 2023-01-14: 15 [IU] via SUBCUTANEOUS
  Administered 2023-01-14: 8 [IU] via SUBCUTANEOUS
  Administered 2023-01-14: 3 [IU] via SUBCUTANEOUS
  Administered 2023-01-15: 11 [IU] via SUBCUTANEOUS
  Administered 2023-01-15: 5 [IU] via SUBCUTANEOUS
  Administered 2023-01-15: 2 [IU] via SUBCUTANEOUS

## 2023-01-13 MED ORDER — INSULIN DETEMIR 100 UNIT/ML ~~LOC~~ SOLN
10.0000 [IU] | Freq: Every day | SUBCUTANEOUS | Status: DC
  Administered 2023-01-13 – 2023-01-15 (×2): 10 [IU] via SUBCUTANEOUS
  Filled 2023-01-13 (×3): qty 0.1

## 2023-01-13 MED ORDER — LIVING WELL WITH DIABETES BOOK
Freq: Once | Status: AC
  Filled 2023-01-13: qty 1

## 2023-01-13 NOTE — Consult Note (Addendum)
[facility] Consult   Reason for Consult:   Referring Physician:   Patient Identification: Tom Anderson MRN:  [id number] Principal Diagnosis: DKA (diabetic ketoacidosis) ([place]) Diagnosis:  Principal Problem:   DKA (diabetic ketoacidosis) ([place]) Active Problems:   Schizophrenia ([place])   Cannabis abuse   Alcohol abuse   Tardive dyskinesia   Type 1 diabetes mellitus with hyperglycemia ([place])   Major depression   Cigarette nicotine dependence without complication   GERD without esophagitis   Total Time spent with patient: 1 hour  Subjective:   Tom Anderson is a 52 y.o. male patient admitted with  hyperglycemia. Psych consult was placed for history of schizophrenia, currently on no medications.   Patient has a psychiatric history of reported schizophrenia, cannabis use disorder, alcohol use disorder, and depression.  He has previously tried multiple antipsychotics to which he reports Geodon, risperidone, Haldol, and Abilify.  He reports history 1 previous suicide attempt almost 8 years ago, in which he self aborted and sought help in an inpatient psychiatric unit.   He has 2 inpatient psychiatric hospitalizations, primarily for psychosis and suicidal ideations.  He is currently receiving outpatient [facility] services at [facility], previous provider is no longer with the company.  At this time he continues to reside with his family (parents), has no children and unemployed.  At the time of this evaluation patient is assessed face-to-face by the psychiatric nurse practitioner.  He was calm and cooperative, made direct eye contact and willing to engage.  His speech was normal rate, mood euthymic and his affect was congruent and cooperative.  Throughout the evaluation he did not present with any disorganized thought processes, irrelevancy, and or flight of ideas.  He did not have any evidence of perpetual disturbances to include auditory and/or visual hallucinations.  He denies any  suicidal ideations and or homicidal ideations.  He is alert and oriented x 4, able to fully communicate without any changes in consciousness.  He is able to answer all questions appropriately, and does present with some limitations in insight and judgment.  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  He states due to the aforementioned conditions he was being prescribed Ritalin and Valium as treatment.  He states he is still seeks outpatient services in [place]. Overall he reports a history of being non compliant and ineffective.   Pt denies SI or HI. Pt denies etoh or drug use, urine drug screen and BAL negative on admission.  Pt alert and oriented x4. His speech is rapid and pressured but cooperative.   Patient seen and assessed by the psychiatric nurse practitioner; chart reviewed and case discussed with Dr. Lovette Cliche. On evaluation Tom Anderson is observed to be lying in bed, eyes closed however he does wake up and acknowledge entry into the room. He also sits up to participate and engage with Probation officer.  Patient does show willingness to participate in his treatment plan, denies having any comments questions or concerns.  He is able to verbalize wanting to go home, and importance of taking his diabetic medications. Patient continues to show limited insight into mental illness, and ongoing need for long-term medications particularly antipsychotics.  Patient continues to denies suicidal ideations, homicidal ideations, and or auditory or visual hallucinations. Although he appears to be psychiatrically stable, patient is offered medications in which he declines. He reports wanting a referral "to a developmental therapist who can prescribe Ritalin and valium for my amygdala aphasia. " .  Patient is suspected to be at baseline at this time, and will psych clear.  He does not appear to be responding to internal stimuli, external stimuli, and or  exhibiting delusional thought disorder.  Although he does have some underlying cognitive limitations likely 2/t to his learning disability.    HPI:   Tom Anderson is a 52 y.o. WM PMHx  Schizophrenia,  Depression, Hx suicide ideation, asthma, Type 1 diabetes,, tardive dyskinesia,malnutrition, Hx AKI, Alcohol abuse, cannabis abuse, GERD without esophagitis, essential tremor Presented to the emergency department with abdominal pain,, multiple episodes of emesis and hyperglycemia.   Past Psychiatric History:   Risk to Self:   Denies Risk to Others:   Denies Prior Inpatient Therapy:    Prior Outpatient Therapy:    Past Medical History:  Past Medical History:  Diagnosis Date   Asthma    Depression    Diabetes mellitus without complication ([place])    Schizophrenia ([place])     Past Surgical History:  Procedure Laterality Date   HERNIA REPAIR     Family History:  Family History  Problem Relation Age of Onset   Diabetes Maternal Grandfather    Family Psychiatric  History:  Social History:  Social History   Substance and Sexual Activity  Alcohol Use Yes   Comment: daily  84 oz per day     Social History   Substance and Sexual Activity  Drug Use Yes   Types: Marijuana    Social History   Socioeconomic History   Marital status: Single    Spouse name: Not on file   Number of children: Not on file   Years of education: Not on file   Highest education level: Not on file  Occupational History   Not on file  Tobacco Use   Smoking status: Every Day    Packs/day: 2.50    Years: 0.00    Total pack years: 0.00    Types: Cigarettes   Smokeless tobacco: Never  Vaping Use   Vaping Use: Never used  Substance and Sexual Activity   Alcohol use: Yes    Comment: daily  84 oz per day   Drug use: Yes    Types: Marijuana   Sexual activity: Not on file  Other Topics Concern   Not on file  Social History Narrative   Not on file   Social Determinants of Health   Financial  Resource Strain: Not on file  Food Insecurity: Not on file  Transportation Needs: Not on file  Physical Activity: Not on file  Stress: Not on file  Social Connections: Not on file   Additional Social History:    Allergies:   Allergies  Allergen Reactions   Nitrous Oxide Nausea And Vomiting    Labs:  Results for orders placed or performed during the hospital encounter of 01/12/23 (from the past 48 hour(s))  CBG monitoring, ED     Status: Abnormal   Collection Time: 01/12/23  8:48 AM  Result Value Ref Range   Glucose-Capillary 410 (H) 70 - 99 mg/dL    Comment: Glucose reference range applies only to samples taken after fasting for at least 8 hours.   Comment 1 Notify RN   CBC with Differential     Status: Abnormal   Collection Time: 01/12/23  9:25 AM  Result Value Ref Range   WBC 14.3 (H) 4.0 - 10.5 K/uL   RBC 4.61 4.22 - 5.81 MIL/uL   Hemoglobin 15.5 13.0 - 17.0 g/dL   HCT  43.5 39.0 - 52.0 %   MCV 94.4 80.0 - 100.0 fL   MCH 33.6 26.0 - 34.0 pg   MCHC 35.6 30.0 - 36.0 g/dL   RDW 11.1 (L) 11.5 - 15.5 %   Platelets 168 150 - 400 K/uL   nRBC 0.0 0.0 - 0.2 %   Neutrophils Relative % 79 %   Neutro Abs 11.3 (H) 1.7 - 7.7 K/uL   Lymphocytes Relative 14 %   Lymphs Abs 1.9 0.7 - 4.0 K/uL   Monocytes Relative 6 %   Monocytes Absolute 0.9 0.1 - 1.0 K/uL   Eosinophils Relative 0 %   Eosinophils Absolute 0.0 0.0 - 0.5 K/uL   Basophils Relative 0 %   Basophils Absolute 0.1 0.0 - 0.1 K/uL   Immature Granulocytes 1 %   Abs Immature Granulocytes 0.14 (H) 0.00 - 0.07 K/uL    Comment: Performed at [facility], [address] [place], [place] [address]  Lactic acid, plasma     Status: Abnormal   Collection Time: 01/12/23  9:25 AM  Result Value Ref Range   Lactic Acid, Venous 2.0 (HH) 0.5 - 1.9 mmol/L    Comment: CRITICAL RESULT CALLED TO, READ BACK BY AND VERIFIED WITH Maureen Chatters, RN 01/12/23 AT [id number] [facility] Performed at [facility], [address]  [place], [place] [address]   I-Stat venous blood gas, ([facility], [facility], [facility])     Status: Abnormal   Collection Time: 01/12/23  9:27 AM  Result Value Ref Range   pH, Ven 7.290 7.25 - 7.43   pCO2, Ven 33.7 (L) 44 - 60 mmHg   pO2, Ven 26 (LL) 32 - 45 mmHg   Bicarbonate 16.2 (L) 20.0 - 28.0 mmol/L   TCO2 17 (L) 22 - 32 mmol/L   O2 Saturation 42 %   Acid-base deficit 9.0 (H) 0.0 - 2.0 mmol/L   Sodium 120 (L) 135 - 145 mmol/L   Potassium >8.5 (HH) 3.5 - 5.1 mmol/L   Calcium, Ion 1.01 (L) 1.15 - 1.40 mmol/L   HCT 51.0 39.0 - 52.0 %   Hemoglobin 17.3 (H) 13.0 - 17.0 g/dL   Patient temperature 98.3 F    Collection site IV start    Drawn by Nurse    Sample type VENOUS    Comment NOTIFIED PHYSICIAN   Beta-hydroxybutyric acid     Status: Abnormal   Collection Time: 01/12/23 10:27 AM  Result Value Ref Range   Beta-Hydroxybutyric Acid >8.00 (H) 0.05 - 0.27 mmol/L    Comment: RESULT CONFIRMED BY MANUAL DILUTION Performed at [facility], [address] [place], [place] [address]   Urinalysis, Routine w reflex microscopic -Urine, Clean Catch     Status: Abnormal   Collection Time: 01/12/23 10:27 AM  Result Value Ref Range   Color, Urine YELLOW YELLOW   APPearance CLEAR CLEAR   Specific Gravity, Urine >=1.030 1.005 - 1.030   pH 5.0 5.0 - 8.0   Glucose, UA >=500 (A) NEGATIVE mg/dL   Hgb urine dipstick TRACE (A) NEGATIVE   Bilirubin Urine SMALL (A) NEGATIVE   Ketones, ur >=80 (A) NEGATIVE mg/dL   Protein, ur 100 (A) NEGATIVE mg/dL   Nitrite NEGATIVE NEGATIVE   Leukocytes,Ua NEGATIVE NEGATIVE    Comment: Performed at [facility], [address] [place], [place] [address]  Resp panel by RT-PCR (RSV, Flu A&B, Covid) Anterior Nasal Swab     Status: None   Collection  Time: 01/12/23 10:27 AM   Specimen: Anterior Nasal Swab  Result Value Ref Range   SARS Coronavirus 2 by RT PCR NEGATIVE NEGATIVE    Comment: (NOTE) SARS-CoV-2 target nucleic acids are NOT DETECTED.  The SARS-CoV-2  RNA is generally detectable in upper respiratory specimens during the acute phase of infection. The lowest concentration of SARS-CoV-2 viral copies this assay can detect is 138 copies/mL. A negative result does not preclude SARS-Cov-2 infection and should not be used as the sole basis for treatment or other patient management decisions. A negative result may occur with  improper specimen collection/handling, submission of specimen other than nasopharyngeal swab, presence of viral mutation(s) within the areas targeted by this assay, and inadequate number of viral copies(<138 copies/mL). A negative result must be combined with clinical observations, patient history, and epidemiological information. The expected result is Negative.  Fact Sheet for Patients:  EntrepreneurPulse.com.au  Fact Sheet for Healthcare Providers:  IncredibleEmployment.be  This test is no t yet approved or cleared by the [place] FDA and  has been authorized for detection and/or diagnosis of SARS-CoV-2 by FDA under an Emergency Use Authorization (EUA). This EUA will remain  in effect (meaning this test can be used) for the duration of the COVID-19 declaration under Section 564(b)(1) of the Act, 21 U.S.C.section 360bbb-3(b)(1), unless the authorization is terminated  or revoked sooner.       Influenza A by PCR NEGATIVE NEGATIVE   Influenza B by PCR NEGATIVE NEGATIVE    Comment: (NOTE) The Xpert Xpress SARS-CoV-2/FLU/RSV plus assay is intended as an aid in the diagnosis of influenza from Nasopharyngeal swab specimens and should not be used as a sole basis for treatment. Nasal washings and aspirates are unacceptable for Xpert Xpress SARS-CoV-2/FLU/RSV testing.  Fact Sheet for Patients: EntrepreneurPulse.com.au  Fact Sheet for Healthcare Providers: IncredibleEmployment.be  This test is not yet approved or cleared by the [place]  FDA and has been authorized for detection and/or diagnosis of SARS-CoV-2 by FDA under an Emergency Use Authorization (EUA). This EUA will remain in effect (meaning this test can be used) for the duration of the COVID-19 declaration under Section 564(b)(1) of the Act, 21 U.S.C. section 360bbb-3(b)(1), unless the authorization is terminated or revoked.     Resp Syncytial Virus by PCR NEGATIVE NEGATIVE    Comment: (NOTE) Fact Sheet for Patients: EntrepreneurPulse.com.au  Fact Sheet for Healthcare Providers: IncredibleEmployment.be  This test is not yet approved or cleared by the [place] FDA and has been authorized for detection and/or diagnosis of SARS-CoV-2 by FDA under an Emergency Use Authorization (EUA). This EUA will remain in effect (meaning this test can be used) for the duration of the COVID-19 declaration under Section 564(b)(1) of the Act, 21 U.S.C. section 360bbb-3(b)(1), unless the authorization is terminated or revoked.  Performed at [facility], [address] [place], [place] [address]   Comprehensive metabolic panel     Status: Abnormal   Collection Time: 01/12/23 10:27 AM  Result Value Ref Range   Sodium 126 (L) 135 - 145 mmol/L   Potassium 4.3 3.5 - 5.1 mmol/L   Chloride 89 (L) 98 - 111 mmol/L   CO2 15 (L) 22 - 32 mmol/L   Glucose, Bld 425 (H) 70 - 99 mg/dL    Comment: Glucose reference range applies only to samples taken after fasting for at least 8 hours.   BUN 19 6 - 20 mg/dL   Creatinine, Ser 1.23 0.61 - 1.24 mg/dL  Calcium 8.4 (L) 8.9 - 10.3 mg/dL   Total Protein 6.8 6.5 - 8.1 g/dL   Albumin 4.0 3.5 - 5.0 g/dL   AST 21 15 - 41 U/L   ALT 23 0 - 44 U/L   Alkaline Phosphatase 123 38 - 126 U/L   Total Bilirubin 1.9 (H) 0.3 - 1.2 mg/dL   GFR, Estimated >60 >60 mL/min    Comment: (NOTE) Calculated using the CKD-EPI Creatinine Equation (2021)    Anion gap 22 (H) 5 - 15    Comment: ELECTROLYTES  REPEATED TO VERIFY [facility] Performed at [facility], [address] [place], [place] [address]   Lipase, blood     Status: Abnormal   Collection Time: 01/12/23 10:27 AM  Result Value Ref Range   Lipase 113 (H) 11 - 51 U/L    Comment: Performed at [facility], [address] [place], [place] [address]  Urinalysis, Microscopic (reflex)     Status: Abnormal   Collection Time: 01/12/23 10:27 AM  Result Value Ref Range   RBC / HPF 0-5 0 - 5 RBC/hpf   WBC, UA 0-5 0 - 5 WBC/hpf   Bacteria, UA RARE (A) NONE SEEN   Squamous Epithelial / HPF 0-5 0 - 5 /HPF    Comment: Performed at [facility], [address] [place], [place] [address]  Lactic acid, plasma     Status: None   Collection Time: 01/12/23 11:30 AM  Result Value Ref Range   Lactic Acid, Venous 1.1 0.5 - 1.9 mmol/L    Comment: Performed at [facility], [address] [place], [place] [address]  CBG monitoring, ED     Status: Abnormal   Collection Time: 01/12/23 12:49 PM  Result Value Ref Range   Glucose-Capillary 453 (H) 70 - 99 mg/dL    Comment: Glucose reference range applies only to samples taken after fasting for at least 8 hours.  I-Stat venous blood gas, ED     Status: Abnormal   Collection Time: 01/12/23 12:54 PM  Result Value Ref Range   pH, Ven 7.244 (L) 7.25 - 7.43   pCO2, Ven 19.2 (LL) 44 - 60 mmHg   pO2, Ven 83 (H) 32 - 45 mmHg   Bicarbonate 8.3 (L) 20.0 - 28.0 mmol/L   TCO2 9 (L) 22 - 32 mmol/L   O2 Saturation 95 %   Acid-base deficit 17.0 (H) 0.0 - 2.0 mmol/L   Sodium 126 (L) 135 - 145 mmol/L   Potassium 4.2 3.5 - 5.1 mmol/L   Calcium, Ion 1.16 1.15 - 1.40 mmol/L   HCT 45.0 39.0 - 52.0 %   Hemoglobin 15.3 13.0 - 17.0 g/dL   Patient temperature 98.3 F    Collection site IV start    Drawn by Nurse    Sample type VENOUS    Comment NOTIFIED PHYSICIAN   CBG monitoring, ED     Status: Abnormal   Collection Time: 01/12/23  1:37 PM  Result Value Ref Range    Glucose-Capillary 450 (H) 70 - 99 mg/dL    Comment: Glucose reference range applies only to samples taken after fasting for at least 8 hours.  CBG monitoring, ED     Status: Abnormal   Collection Time: 01/12/23  2:16 PM  Result Value Ref Range   Glucose-Capillary 363 (H) 70 - 99 mg/dL    Comment: Glucose reference range applies only to samples taken after fasting for at  least 8 hours.  Glucose, capillary     Status: Abnormal   Collection Time: 01/12/23  3:30 PM  Result Value Ref Range   Glucose-Capillary 283 (H) 70 - 99 mg/dL    Comment: Glucose reference range applies only to samples taken after fasting for at least 8 hours.  Basic metabolic panel     Status: Abnormal   Collection Time: 01/12/23  4:11 PM  Result Value Ref Range   Sodium 130 (L) 135 - 145 mmol/L   Potassium 3.6 3.5 - 5.1 mmol/L   Chloride 101 98 - 111 mmol/L   CO2 18 (L) 22 - 32 mmol/L   Glucose, Bld 178 (H) 70 - 99 mg/dL    Comment: Glucose reference range applies only to samples taken after fasting for at least 8 hours.   BUN 16 6 - 20 mg/dL   Creatinine, Ser 1.03 0.61 - 1.24 mg/dL   Calcium 8.5 (L) 8.9 - 10.3 mg/dL   GFR, Estimated >60 >60 mL/min    Comment: (NOTE) Calculated using the CKD-EPI Creatinine Equation (2021)    Anion gap 11 5 - 15    Comment: Performed at [facility], [place] [address] [place], [place] [address]  Beta-hydroxybutyric acid     Status: Abnormal   Collection Time: 01/12/23  4:11 PM  Result Value Ref Range   Beta-Hydroxybutyric Acid 4.06 (H) 0.05 - 0.27 mmol/L    Comment: Performed at [facility], [place] [address] [place], [place] [address]  Hemoglobin A1c     Status: Abnormal   Collection Time: 01/12/23  4:11 PM  Result Value Ref Range   Hgb A1c MFr Bld 11.8 (H) 4.8 - 5.6 %    Comment: (NOTE) Pre diabetes:          5.7%-6.4%  Diabetes:              >6.4%  Glycemic control for   <7.0% adults with diabetes    Mean Plasma Glucose 291.96 mg/dL     Comment: Performed at [place] [address] [place], [place] [address]  Glucose, capillary     Status: Abnormal   Collection Time: 01/12/23  4:31 PM  Result Value Ref Range   Glucose-Capillary 192 (H) 70 - 99 mg/dL    Comment: Glucose reference range applies only to samples taken after fasting for at least 8 hours.  Glucose, capillary     Status: Abnormal   Collection Time: 01/12/23  5:32 PM  Result Value Ref Range   Glucose-Capillary 155 (H) 70 - 99 mg/dL    Comment: Glucose reference range applies only to samples taken after fasting for at least 8 hours.  Glucose, capillary     Status: Abnormal   Collection Time: 01/12/23  6:36 PM  Result Value Ref Range   Glucose-Capillary 143 (H) 70 - 99 mg/dL    Comment: Glucose reference range applies only to samples taken after fasting for at least 8 hours.  Basic metabolic panel     Status: Abnormal   Collection Time: 01/12/23  7:23 PM  Result Value Ref Range   Sodium 132 (L) 135 - 145 mmol/L   Potassium 3.4 (L) 3.5 - 5.1 mmol/L   Chloride 98 98 - 111 mmol/L   CO2 23 22 - 32 mmol/L   Glucose, Bld 117 (H) 70 - 99 mg/dL    Comment: Glucose reference range applies only to samples taken after fasting for at least 8 hours.  BUN 16 6 - 20 mg/dL   Creatinine, Ser 0.98 0.61 - 1.24 mg/dL   Calcium 8.8 (L) 8.9 - 10.3 mg/dL   GFR, Estimated >60 >60 mL/min    Comment: (NOTE) Calculated using the CKD-EPI Creatinine Equation (2021)    Anion gap 11 5 - 15    Comment: Performed at [facility], [place] [address], [place], [place] [address]  Beta-hydroxybutyric acid     Status: Abnormal   Collection Time: 01/12/23  7:23 PM  Result Value Ref Range   Beta-Hydroxybutyric Acid 1.71 (H) 0.05 - 0.27 mmol/L    Comment: Performed at [facility], [place] [address] [place], [place] [address]  Glucose, capillary     Status: Abnormal   Collection Time: 01/12/23  7:37 PM  Result Value Ref Range    Glucose-Capillary 104 (H) 70 - 99 mg/dL    Comment: Glucose reference range applies only to samples taken after fasting for at least 8 hours.  Glucose, capillary     Status: Abnormal   Collection Time: 01/12/23  8:40 PM  Result Value Ref Range   Glucose-Capillary 142 (H) 70 - 99 mg/dL    Comment: Glucose reference range applies only to samples taken after fasting for at least 8 hours.  Glucose, capillary     Status: Abnormal   Collection Time: 01/12/23  9:37 PM  Result Value Ref Range   Glucose-Capillary 170 (H) 70 - 99 mg/dL    Comment: Glucose reference range applies only to samples taken after fasting for at least 8 hours.  Glucose, capillary     Status: Abnormal   Collection Time: 01/12/23 10:36 PM  Result Value Ref Range   Glucose-Capillary 148 (H) 70 - 99 mg/dL    Comment: Glucose reference range applies only to samples taken after fasting for at least 8 hours.  Basic metabolic panel     Status: Abnormal   Collection Time: 01/12/23 11:49 PM  Result Value Ref Range   Sodium 133 (L) 135 - 145 mmol/L   Potassium 3.1 (L) 3.5 - 5.1 mmol/L   Chloride 97 (L) 98 - 111 mmol/L   CO2 23 22 - 32 mmol/L   Glucose, Bld 114 (H) 70 - 99 mg/dL    Comment: Glucose reference range applies only to samples taken after fasting for at least 8 hours.   BUN 13 6 - 20 mg/dL   Creatinine, Ser 0.93 0.61 - 1.24 mg/dL   Calcium 8.4 (L) 8.9 - 10.3 mg/dL   GFR, Estimated >60 >60 mL/min    Comment: (NOTE) Calculated using the CKD-EPI Creatinine Equation (2021)    Anion gap 13 5 - 15    Comment: Performed at [facility], [place] [address], [place], [place] [address]  Beta-hydroxybutyric acid     Status: Abnormal   Collection Time: 01/12/23 11:49 PM  Result Value Ref Range   Beta-Hydroxybutyric Acid 1.45 (H) 0.05 - 0.27 mmol/L    Comment: Performed at [facility], [place] [address] [place], [place] [address]  Glucose, capillary     Status: Abnormal   Collection Time:  01/13/23 12:39 AM  Result Value Ref Range   Glucose-Capillary 111 (H) 70 - 99 mg/dL    Comment: Glucose reference range applies only to samples taken after fasting for at least 8 hours.  Glucose, capillary     Status: Abnormal   Collection Time: 01/13/23  1:43 AM  Result Value Ref Range   Glucose-Capillary 166 (H)  70 - 99 mg/dL    Comment: Glucose reference range applies only to samples taken after fasting for at least 8 hours.  Glucose, capillary     Status: Abnormal   Collection Time: 01/13/23  2:35 AM  Result Value Ref Range   Glucose-Capillary 169 (H) 70 - 99 mg/dL    Comment: Glucose reference range applies only to samples taken after fasting for at least 8 hours.  Glucose, capillary     Status: Abnormal   Collection Time: 01/13/23  3:34 AM  Result Value Ref Range   Glucose-Capillary 134 (H) 70 - 99 mg/dL    Comment: Glucose reference range applies only to samples taken after fasting for at least 8 hours.  HIV Antibody (routine testing w rflx)     Status: None   Collection Time: 01/13/23  3:35 AM  Result Value Ref Range   HIV Screen 4th Generation wRfx Non Reactive Non Reactive    Comment: Performed at [facility], [place] [address], [place], [place] [address]  CBC     Status: Abnormal   Collection Time: 01/13/23  3:35 AM  Result Value Ref Range   WBC 9.4 4.0 - 10.5 K/uL   RBC 3.71 (L) 4.22 - 5.81 MIL/uL   Hemoglobin 12.5 (L) 13.0 - 17.0 g/dL   HCT 35.5 (L) 39.0 - 52.0 %   MCV 95.7 80.0 - 100.0 fL   MCH 33.7 26.0 - 34.0 pg   MCHC 35.2 30.0 - 36.0 g/dL   RDW 11.0 (L) 11.5 - 15.5 %   Platelets 322 150 - 400 K/uL   nRBC 0.0 0.0 - 0.2 %    Comment: Performed at [facility], [place] [address], [place], [place] [address]  Comprehensive metabolic panel     Status: Abnormal   Collection Time: 01/13/23  3:35 AM  Result Value Ref Range   Sodium 131 (L) 135 - 145 mmol/L   Potassium 3.2 (L) 3.5 - 5.1 mmol/L   Chloride 97 (L) 98 - 111 mmol/L   CO2 24 22 - 32  mmol/L   Glucose, Bld 131 (H) 70 - 99 mg/dL    Comment: Glucose reference range applies only to samples taken after fasting for at least 8 hours.   BUN 13 6 - 20 mg/dL   Creatinine, Ser 0.99 0.61 - 1.24 mg/dL   Calcium 8.3 (L) 8.9 - 10.3 mg/dL   Total Protein 5.9 (L) 6.5 - 8.1 g/dL   Albumin 3.5 3.5 - 5.0 g/dL   AST 21 15 - 41 U/L   ALT 21 0 - 44 U/L   Alkaline Phosphatase 89 38 - 126 U/L   Total Bilirubin 1.0 0.3 - 1.2 mg/dL   GFR, Estimated >60 >60 mL/min    Comment: (NOTE) Calculated using the CKD-EPI Creatinine Equation (2021)    Anion gap 10 5 - 15    Comment: Performed at [facility], [place] [address], [place], [place] [address]  Beta-hydroxybutyric acid     Status: Abnormal   Collection Time: 01/13/23  3:35 AM  Result Value Ref Range   Beta-Hydroxybutyric Acid 1.38 (H) 0.05 - 0.27 mmol/L    Comment: Performed at [facility], [place] [address] [place], [place] [address]  Glucose, capillary     Status: Abnormal   Collection Time: 01/13/23  4:45 AM  Result Value Ref Range   Glucose-Capillary 129 (H) 70 - 99 mg/dL    Comment: Glucose reference range applies only to  samples taken after fasting for at least 8 hours.  Glucose, capillary     Status: Abnormal   Collection Time: 01/13/23  5:57 AM  Result Value Ref Range   Glucose-Capillary 152 (H) 70 - 99 mg/dL    Comment: Glucose reference range applies only to samples taken after fasting for at least 8 hours.  Glucose, capillary     Status: Abnormal   Collection Time: 01/13/23  7:01 AM  Result Value Ref Range   Glucose-Capillary 146 (H) 70 - 99 mg/dL    Comment: Glucose reference range applies only to samples taken after fasting for at least 8 hours.  Basic metabolic panel     Status: Abnormal   Collection Time: 01/13/23  7:58 AM  Result Value Ref Range   Sodium 134 (L) 135 - 145 mmol/L   Potassium 3.2 (L) 3.5 - 5.1 mmol/L   Chloride 99 98 - 111 mmol/L   CO2 26 22 - 32 mmol/L   Glucose,  Bld 109 (H) 70 - 99 mg/dL    Comment: Glucose reference range applies only to samples taken after fasting for at least 8 hours.   BUN 12 6 - 20 mg/dL   Creatinine, Ser 0.83 0.61 - 1.24 mg/dL   Calcium 8.5 (L) 8.9 - 10.3 mg/dL   GFR, Estimated >60 >60 mL/min    Comment: (NOTE) Calculated using the CKD-EPI Creatinine Equation (2021)    Anion gap 9 5 - 15    Comment: Performed at [facility], [place] [address], [place], [place] [address]  Beta-hydroxybutyric acid     Status: Abnormal   Collection Time: 01/13/23  7:58 AM  Result Value Ref Range   Beta-Hydroxybutyric Acid 0.66 (H) 0.05 - 0.27 mmol/L    Comment: Performed at [facility], [place] [address] [place], [place] [address]  Magnesium     Status: None   Collection Time: 01/13/23  7:58 AM  Result Value Ref Range   Magnesium 2.2 1.7 - 2.4 mg/dL    Comment: Performed at [facility], [place] [address], [place], [place] [address]  Ethanol     Status: None   Collection Time: 01/13/23  7:58 AM  Result Value Ref Range   Alcohol, Ethyl (B) <10 <10 mg/dL    Comment: (NOTE) Lowest detectable limit for serum alcohol is 10 mg/dL.  For medical purposes only. Performed at [facility], [place] [address] [place],  [address]   Glucose, capillary     Status: Abnormal   Collection Time: 01/13/23  8:06 AM  Result Value Ref Range   Glucose-Capillary 128 (H) 70 - 99 mg/dL    Comment: Glucose reference range applies only to samples taken after fasting for at least 8 hours.  Glucose, capillary     Status: None   Collection Time: 01/13/23  9:08 AM  Result Value Ref Range   Glucose-Capillary 82 70 - 99 mg/dL    Comment: Glucose reference range applies only to samples taken after fasting for at least 8 hours.  Glucose, capillary     Status: Abnormal   Collection Time: 01/13/23 10:21 AM  Result Value Ref Range   Glucose-Capillary 120 (H) 70 - 99 mg/dL    Comment: Glucose  reference range applies only to samples taken after fasting for at least 8 hours.  Glucose, capillary     Status: Abnormal   Collection Time: 01/13/23 11:58 AM  Result Value Ref Range   Glucose-Capillary 253 (H) 70 -  99 mg/dL    Comment: Glucose reference range applies only to samples taken after fasting for at least 8 hours.  Rapid urine drug screen (hospital performed)     Status: Abnormal   Collection Time: 01/13/23 12:02 PM  Result Value Ref Range   Opiates POSITIVE (A) NONE DETECTED   Cocaine NONE DETECTED NONE DETECTED   Benzodiazepines NONE DETECTED NONE DETECTED   Amphetamines NONE DETECTED NONE DETECTED   Tetrahydrocannabinol POSITIVE (A) NONE DETECTED   Barbiturates NONE DETECTED NONE DETECTED    Comment: (NOTE) DRUG SCREEN FOR MEDICAL PURPOSES ONLY.  IF CONFIRMATION IS NEEDED FOR ANY PURPOSE, NOTIFY LAB WITHIN 5 DAYS.  LOWEST DETECTABLE LIMITS FOR URINE DRUG SCREEN Drug Class                     Cutoff (ng/mL) Amphetamine and metabolites    1000 Barbiturate and metabolites    200 Benzodiazepine                 200 Opiates and metabolites        300 Cocaine and metabolites        300 THC                            50 Performed at [facility], [place] [address], [place], [place] [address]   Glucose, capillary     Status: Abnormal   Collection Time: 01/13/23  2:37 PM  Result Value Ref Range   Glucose-Capillary 214 (H) 70 - 99 mg/dL    Comment: Glucose reference range applies only to samples taken after fasting for at least 8 hours.  Glucose, capillary     Status: Abnormal   Collection Time: 01/13/23  3:52 PM  Result Value Ref Range   Glucose-Capillary 202 (H) 70 - 99 mg/dL    Comment: Glucose reference range applies only to samples taken after fasting for at least 8 hours.    Current Facility-Administered Medications  Medication Dose Route Frequency Provider Last Rate Last Admin   acetaminophen (TYLENOL) tablet 650 mg  650 mg Oral Q6H PRN Reubin Milan, MD   650 mg at 01/13/23 1551   Or   acetaminophen (TYLENOL) suppository 650 mg  650 mg Rectal Q6H PRN Reubin Milan, MD       Chlorhexidine Gluconate Cloth 2 % PADS 6 each  6 each Topical Daily Reubin Milan, MD   6 each at 01/12/23 1635   dextrose 50 % solution 0-50 mL  0-50 mL Intravenous PRN Reubin Milan, MD       enoxaparin (LOVENOX) injection 40 mg  40 mg Subcutaneous Q24H Reubin Milan, MD   40 mg at 01/12/23 2237   [START ON 01/14/2023] feeding supplement (GLUCERNA SHAKE) (GLUCERNA SHAKE) liquid 237 mL  237 mL Oral BID BM Allie Bossier, MD       folic acid (FOLVITE) tablet 1 mg  1 mg Oral Daily Reubin Milan, MD   1 mg at 01/12/23 1947   insulin aspart (novoLOG) injection 0-15 Units  0-15 Units Subcutaneous Q4H Allie Bossier, MD   5 Units at 01/13/23 1555   insulin detemir (LEVEMIR) injection 10 Units  10 Units Subcutaneous Daily Allie Bossier, MD   10 Units at 01/13/23 1202   LORazepam (ATIVAN) tablet 1-4 mg  1-4 mg Oral Q1H PRN Reubin Milan, MD   1 mg at 01/13/23 [phone]  Or   LORazepam (ATIVAN) tablet 1 mg  1 mg Oral Q1H PRN Reubin Milan, MD       morphine (PF) 2 MG/ML injection 2 mg  2 mg Intravenous Q2H PRN Reubin Milan, MD   2 mg at 01/12/23 2044   multivitamin with minerals tablet 1 tablet  1 tablet Oral Daily Reubin Milan, MD   1 tablet at 01/13/23 0919   ondansetron [facility]) tablet 4 mg  4 mg Oral Q6H PRN Reubin Milan, MD       Or   ondansetron [facility]) injection 4 mg  4 mg Intravenous Q6H PRN Reubin Milan, MD   4 mg at 01/12/23 1757   Oral care mouth rinse  15 mL Mouth Rinse PRN Reubin Milan, MD       thiamine (VITAMIN B1) tablet 100 mg  100 mg Oral Daily Reubin Milan, MD   100 mg at 01/13/23 [id number]   Or   thiamine (VITAMIN B1) injection 100 mg  100 mg Intravenous Daily Reubin Milan, MD   100 mg at 01/12/23 1946    Musculoskeletal: Strength & Muscle Tone: within  normal limits Gait & Station: normal Patient leans: N/A     Psychiatric Specialty Exam:  Presentation  General Appearance:  Disheveled  Eye Contact: Good  Speech: Clear and Coherent  Speech Volume: Normal  Handedness: Right   Mood and Affect  Mood: Euthymic  Affect: Appropriate; Congruent   Thought Process  Thought Processes: Coherent; Linear; Goal Directed  Descriptions of Associations:Intact  Orientation:Full (Time, Place and Person)  Thought Content:WDL  History of Schizophrenia/Schizoaffective disorder:No data recorded Duration of Psychotic Symptoms:No data recorded Hallucinations:Hallucinations: None  Ideas of Reference:None  Suicidal Thoughts:Suicidal Thoughts: No  Homicidal Thoughts:Homicidal Thoughts: No   Sensorium  Memory: Immediate Fair; Recent Fair; Remote Fair  Judgment: Fair  Insight: Fair   Community education officer  Concentration: Fair  Attention Span: Good  Recall: [employer] of Knowledge: Fair  Language: Fair   Psychomotor Activity  Psychomotor Activity: Psychomotor Activity: Normal   Assets  Assets: Financial Resources/Insurance; Desire for Improvement; Communication Skills; Physical Health; Social Support; Resilience   Sleep  Sleep: Sleep: Good   Physical Exam: Physical Exam Vitals and nursing note reviewed.  Constitutional:      Appearance: He is well-developed and normal weight.  Skin:    General: Skin is warm and dry.  Neurological:     General: No focal deficit present.     Mental Status: He is alert and oriented to person, place, and time.  Psychiatric:        Behavior: Behavior normal.    ROS Blood pressure 94/76, pulse 86, temperature 98 F (36.7 C), temperature source Oral, resp. rate 16, height 5' 9"$  (1.753 m), weight 55.3 kg, SpO2 96 %. Body mass index is 18 kg/m.  Treatment Plan Summary: Patient is encouraged to follow-up with [facility] for medication  management. -Will update AVS to reflect information for developmental psychologist at [facility]. -Patient was offered the opportunity to resume his psychotropic medication, in which he declined being stable and off medications for quite some time.  He did not present with any acute psychiatric symptoms that warrant initiation of medication. -He does not meet criteria for [facility] involuntary commitment, as he does not appear to be an imminent risk to self and others -Urine drug screen positive for THC and opiates.  Psychiatry consult service to sign off at this  time.  The above communication has been discussed with primary team.  Disposition: No evidence of imminent risk to self or others at present.   Patient does not meet criteria for psychiatric inpatient admission. Supportive therapy provided about ongoing stressors. Refer to IOP. Discussed crisis plan, support from social network, calling 911, coming to the Emergency Department, and calling Suicide Hotline.  Suella Broad, FNP 01/13/2023 4:25 PM

## 2023-01-13 NOTE — Plan of Care (Signed)
Continue on Endo Tool. Patient states that he understands

## 2023-01-13 NOTE — TOC Initial Note (Signed)
Transition of Care [facility]) - Initial/Assessment Note    Patient Details  Name: Tom Anderson MRN: [id number] Date of Birth: 04-Mar-1971  Transition of Care [facility]) CM/SW Contact:    Roseanne Kaufman, RN Phone Number: 01/13/2023, 4:21 PM  Clinical Narrative:    [facility] consulted for SA resources, attached SA resources to AVS.   TOC will continue to follow.                  Barriers to Discharge: Continued Medical Work up   Patient Goals and CMS Choice            Expected Discharge Plan and Services In-house Referral: NA Discharge Planning Services: CM Consult   Living arrangements for the past 2 months: Single Family Home                                      Prior Living Arrangements/Services Living arrangements for the past 2 months: Single Family Home                     Activities of Daily Living      Permission Sought/Granted                  Emotional Assessment              Admission diagnosis:  DKA (diabetic ketoacidosis) ([place]) [E11.10] Patient Active Problem List   Diagnosis Date Noted   Type 1 diabetes mellitus with hyperglycemia ([place]) 11/10/2021   GERD without esophagitis 11/04/2020   Malnutrition of moderate degree 10/07/2020   AKI (acute kidney injury) ([place]) 10/06/2020   Acute respiratory failure with hypoxia and hypercapnia ([place]) Q000111Q   Acute metabolic encephalopathy Q000111Q   T2DM (type 2 diabetes mellitus) ([place]) 10/06/2020   T1DM (type 1 diabetes mellitus) ([place]) 10/06/2020   Schizophrenia ([place]) 10/06/2020   Acute GI bleeding    High anion gap metabolic acidosis    DKA (diabetic ketoacidosis) ([place]) 10/03/2020   Cannabis abuse 10/14/2016   Alcohol abuse 10/14/2016   Suicidal ideation 10/14/2016   Epigastric pain 12/24/2015   Dyspepsia 12/24/2015   Chronic fatigue 12/22/2015   Tardive dyskinesia 12/22/2015   Major depression 12/22/2015   Essential tremor 12/22/2015   Cigarette nicotine dependence without  complication [id number]   PCP:  Pcp, No Pharmacy:   [facility], [place] - [address] [place] [address] Phone: [phone] Fax: [phone]     Social Determinants of Health (SDOH) Social History: SDOH Screenings   Tobacco Use: High Risk (01/12/2023)   SDOH Interventions:     Readmission Risk Interventions     No data to display

## 2023-01-13 NOTE — Progress Notes (Signed)
PROGRESS NOTE    Tom Anderson  [id number] DOB: [birth year] DOA: 01/12/2023 PCP: Merryl Hacker, No   Brief Narrative:   Tom Anderson is a 52 y.o. WM PMHx  Schizophrenia,  Depression, Hx suicide ideation, asthma, Type 1 diabetes,, tardive dyskinesia,malnutrition, Hx AKI, Alcohol abuse, cannabis abuse, GERD without esophagitis, essential tremor  Presented to the emergency department with abdominal pain,, multiple episodes of emesis and hyperglycemia.  He stated he has missed a few insulin doses.  No  diarrhea, constipation, melena or hematochezia. He denied fever, chills, rhinorrhea, sore throat or hemoptysis.  He has frequent wheezing.  No chest pain, palpitations, diaphoresis, PND, orthopnea or pitting edema of the lower extremities.  No flank pain, dysuria, frequency or hematuria.  No polyuria, polydipsia, polyphagia or blurred vision.    Lab work: CBC showed white count of 14.3, hemoglobin 15.5 g/dL platelets 168.  Urinalysis showed glucosuria more than 500, ketonuria of 80 and proteinuria of 100 mg/deciliter.  There was trace hemoglobin hemoglobin and small bilirubin.  Rare bacteria microscopic examination.  Negative for coronavirus, influenza and RSV PCR.  Lactic acid is 2.0 and 1.1 mmol/L.  Lipase 113 units/L.  CMP showed CO2 of 15 mmol/L with an anion gap of 22.  Bilirubin of 1.9, calcium 8.4 and glucose 425 mg/dL.  The rest of the CMP measurements were normal.  That the hydroxybutyric acid was more than 8.0 mmol/L.  ED course: Initial vital signs were temperature 98.3 F, pulse 122, respirations 24, BP 111/96 mmHg and O2 sat 100% on room air.  The patient received IV fluids, ondansetron 4 mg IVP x 2, KCl 10 mEq IVP x 2 and was started on an insulin infusion.    Subjective: 2/14 A/O x 4.  States has had difficulty with his insulin since 11-Sep-2023 (death in family exacerbating problem).  States has Dexcom but lost receiver.  Does not have cell phone.   Assessment & Plan:  Covid  vaccination;  Principal Problem:   DKA (diabetic ketoacidosis) ([place]) Active Problems:   Schizophrenia ([place])   Cannabis abuse   Alcohol abuse   Tardive dyskinesia   Type 1 diabetes mellitus with hyperglycemia ([place])   Major depression   Cigarette nicotine dependence without complication   GERD without esophagitis  DM type I uncontrolled with Hyperglycemia ([place]) -2/13 Hemoglobin A1c= 11.8 -2/14 consult DM coordinator; patient will require new receiver for his Dexcom - 2/14 consult DM nutrition - 2/14 Levemir 10 units.  2 hours following administration of Levemir DC Endo tool   DKA (diabetic ketoacidosis) (HCC) -Observation/stepdown. -Endo tool (insulin infusion)  -2/14 Levemir 10 units.  2 hours following administration of Levemir DC Endo tool CBG (last 3)  Recent Labs    01/13/23 0334 01/13/23 0445 01/13/23 0557  GLUCAP 134* 129* 152*      Schizophrenia (HCC)/Major depression -Per MAR patient not on any medication for schizophrenia or depression. -Per H&P has not been followed by psychiatry. - Outpatient psychiatry consult.   Tardive dyskinesia -2/14 psych consult requested - Untreated schizophrenia, major depression, tardive dyskinesia, recommendation on medications.  Polysubstance abuse - 2/14 UDS, EtOH pending   Cannabis abuse -Cessation advised.  See polysubstance abuse   EtOH Abuse -Drinks 42 to 84 ounces of beer daily. -CIWA protocol   Cigarette nicotine dependence without complication -Smoking cessation advised. -2/14 declined nicotine replacement therapy x 3.   GERD without esophagitis -H2 blocker or PPI as needed.   Hypokalemia - Potassium goal> 4 -2/14 K-Dur 40 mEq  Mobility Assessment (last 72 hours)     Mobility Assessment   No documentation.               DVT prophylaxis: Lovenox Code Status: Full Family Communication:  Status is: Inpatient    Dispo: The patient is from: Home              Anticipated d/c is  to: Home              Anticipated d/c date is: 2 days              Patient currently is not medically stable to d/c.      Consultants:    Procedures/Significant Events:     I have personally reviewed and interpreted all radiology studies and my findings are as above.  VENTILATOR SETTINGS:    Cultures   Antimicrobials:    Devices    LINES / TUBES:      Continuous Infusions:  dextrose 5% lactated ringers 125 mL/hr at 01/13/23 0707   insulin 1.4 Units/hr (01/13/23 0705)   lactated ringers Stopped (01/12/23 1633)     Objective: Vitals:   01/13/23 0000 01/13/23 0335 01/13/23 0400 01/13/23 0700  BP: 99/68  113/68   Pulse: 88  89   Resp: 15  (!) 26   Temp: 98.2 F (36.8 C) 98 F (36.7 C)  98.2 F (36.8 C)  TempSrc: Oral Oral  Oral  SpO2: 100%  100%   Weight:      Height:        Intake/Output Summary (Last 24 hours) at 01/13/2023 [id number] Last data filed at 01/13/2023 [id number] Gross per 24 hour  Intake 3909.6 ml  Output 200 ml  Net 3709.6 ml   Filed Weights   01/12/23 0839 01/12/23 1537  Weight: 63.5 kg 55.3 kg    Examination:  General: A/O x 4, somewhat combative, No acute respiratory distress Eyes: negative scleral hemorrhage, negative anisocoria, negative icterus ENT: Negative Runny nose, negative gingival bleeding, Neck:  Negative scars, masses, torticollis, lymphadenopathy, JVD Lungs: Clear to auscultation bilaterally without wheezes or crackles Cardiovascular: Regular rate and rhythm without murmur gallop or rub normal S1 and S2 Abdomen: negative abdominal pain, nondistended, positive soft, bowel sounds, no rebound, no ascites, no appreciable mass Extremities: No significant cyanosis, clubbing, or edema bilateral lower extremities Skin: Negative rashes, lesions, ulcers Psychiatric:  Negative depression, negative anxiety, negative fatigue, negative mania  Central nervous system:  Cranial nerves II through XII intact, tongue/uvula midline, all  extremities muscle strength 5/5, sensation intact throughout, positive dysarthria, negative expressive aphasia, negative receptive aphasia.  .     Data Reviewed: Care during the described time interval was provided by me .  I have reviewed this patient's available data, including medical history, events of note, physical examination, and all test results as part of my evaluation.   CBC: Recent Labs  Lab 01/12/23 0925 01/12/23 0927 01/12/23 1254 01/13/23 0335  WBC 14.3*  --   --  9.4  NEUTROABS 11.3*  --   --   --   HGB 15.5 17.3* 15.3 12.5*  HCT 43.5 51.0 45.0 35.5*  MCV 94.4  --   --  95.7  PLT 168  --   --  [id number]   Basic Metabolic Panel: Recent Labs  Lab 01/12/23 1027 01/12/23 1254 01/12/23 1611 01/12/23 1923 01/12/23 2349 01/13/23 0335  NA 126* 126* 130* 132* 133* 131*  K 4.3 4.2 3.6 3.4* 3.1* 3.2*  CL 89*  --  101 98 97* 97*  CO2 15*  --  18* 23 23 24  $ GLUCOSE 425*  --  178* 117* 114* 131*  BUN 19  --  16 16 13 13  $ CREATININE 1.23  --  1.03 0.98 0.93 0.99  CALCIUM 8.4*  --  8.5* 8.8* 8.4* 8.3*   GFR: Estimated Creatinine Clearance: 70.6 mL/min (by C-G formula based on SCr of 0.99 mg/dL). Liver Function Tests: Recent Labs  Lab 01/12/23 1027 01/13/23 0335  AST 21 21  ALT 23 21  ALKPHOS 123 89  BILITOT 1.9* 1.0  PROT 6.8 5.9*  ALBUMIN 4.0 3.5   Recent Labs  Lab 01/12/23 1027  LIPASE 113*   No results for input(s): "AMMONIA" in the last 168 hours. Coagulation Profile: No results for input(s): "INR", "PROTIME" in the last 168 hours. Cardiac Enzymes: No results for input(s): "CKTOTAL", "CKMB", "CKMBINDEX", "TROPONINI" in the last 168 hours. BNP (last 3 results) No results for input(s): "PROBNP" in the last 8760 hours. HbA1C: Recent Labs    01/12/23 1611  HGBA1C 11.8*   CBG: Recent Labs  Lab 01/13/23 0143 01/13/23 0235 01/13/23 0334 01/13/23 0445 01/13/23 0557  GLUCAP 166* 169* 134* 129* 152*   Lipid Profile: No results for input(s):  "CHOL", "HDL", "LDLCALC", "TRIG", "CHOLHDL", "LDLDIRECT" in the last 72 hours. Thyroid Function Tests: No results for input(s): "TSH", "T4TOTAL", "FREET4", "T3FREE", "THYROIDAB" in the last 72 hours. Anemia Panel: No results for input(s): "VITAMINB12", "FOLATE", "FERRITIN", "TIBC", "IRON", "RETICCTPCT" in the last 72 hours. Urine analysis:    Component Value Date/Time   COLORURINE YELLOW 01/12/2023 1027   APPEARANCEUR CLEAR 01/12/2023 1027   LABSPEC >=1.030 01/12/2023 1027   PHURINE 5.0 01/12/2023 1027   GLUCOSEU >=500 (A) 01/12/2023 1027   HGBUR TRACE (A) 01/12/2023 1027   BILIRUBINUR SMALL (A) 01/12/2023 1027   KETONESUR >=80 (A) 01/12/2023 1027   PROTEINUR 100 (A) 01/12/2023 1027   NITRITE NEGATIVE 01/12/2023 1027   LEUKOCYTESUR NEGATIVE 01/12/2023 1027   Sepsis Labs: @LABRCNTIP$ (procalcitonin:4,lacticidven:4)  ) Recent Results (from the past 240 hour(s))  Resp panel by RT-PCR (RSV, Flu A&B, Covid) Anterior Nasal Swab     Status: None   Collection Time: 01/12/23 10:27 AM   Specimen: Anterior Nasal Swab  Result Value Ref Range Status   SARS Coronavirus 2 by RT PCR NEGATIVE NEGATIVE Final    Comment: (NOTE) SARS-CoV-2 target nucleic acids are NOT DETECTED.  The SARS-CoV-2 RNA is generally detectable in upper respiratory specimens during the acute phase of infection. The lowest concentration of SARS-CoV-2 viral copies this assay can detect is 138 copies/mL. A negative result does not preclude SARS-Cov-2 infection and should not be used as the sole basis for treatment or other patient management decisions. A negative result may occur with  improper specimen collection/handling, submission of specimen other than nasopharyngeal swab, presence of viral mutation(s) within the areas targeted by this assay, and inadequate number of viral copies(<138 copies/mL). A negative result must be combined with clinical observations, patient history, and epidemiological information. The  expected result is Negative.  Fact Sheet for Patients:  EntrepreneurPulse.com.au  Fact Sheet for Healthcare Providers:  IncredibleEmployment.be  This test is no t yet approved or cleared by the [place] FDA and  has been authorized for detection and/or diagnosis of SARS-CoV-2 by FDA under an Emergency Use Authorization (EUA). This EUA will remain  in effect (meaning this test can be used) for the duration of the COVID-19 declaration under Section 564(b)(1) of the Act, 21 U.S.C.section  360bbb-3(b)(1), unless the authorization is terminated  or revoked sooner.       Influenza A by PCR NEGATIVE NEGATIVE Final   Influenza B by PCR NEGATIVE NEGATIVE Final    Comment: (NOTE) The Xpert Xpress SARS-CoV-2/FLU/RSV plus assay is intended as an aid in the diagnosis of influenza from Nasopharyngeal swab specimens and should not be used as a sole basis for treatment. Nasal washings and aspirates are unacceptable for Xpert Xpress SARS-CoV-2/FLU/RSV testing.  Fact Sheet for Patients: EntrepreneurPulse.com.au  Fact Sheet for Healthcare Providers: IncredibleEmployment.be  This test is not yet approved or cleared by the [place] FDA and has been authorized for detection and/or diagnosis of SARS-CoV-2 by FDA under an Emergency Use Authorization (EUA). This EUA will remain in effect (meaning this test can be used) for the duration of the COVID-19 declaration under Section 564(b)(1) of the Act, 21 U.S.C. section 360bbb-3(b)(1), unless the authorization is terminated or revoked.     Resp Syncytial Virus by PCR NEGATIVE NEGATIVE Final    Comment: (NOTE) Fact Sheet for Patients: EntrepreneurPulse.com.au  Fact Sheet for Healthcare Providers: IncredibleEmployment.be  This test is not yet approved or cleared by the [place] FDA and has been authorized for detection and/or  diagnosis of SARS-CoV-2 by FDA under an Emergency Use Authorization (EUA). This EUA will remain in effect (meaning this test can be used) for the duration of the COVID-19 declaration under Section 564(b)(1) of the Act, 21 U.S.C. section 360bbb-3(b)(1), unless the authorization is terminated or revoked.  Performed at [facility], [address] [place], [place] [address]          Radiology Studies: No results found.      Scheduled Meds:  Chlorhexidine Gluconate Cloth  6 each Topical Daily   enoxaparin (LOVENOX) injection  40 mg Subcutaneous [id number]   folic acid  1 mg Oral Daily   multivitamin with minerals  1 tablet Oral Daily   thiamine  100 mg Oral Daily   Or   thiamine  100 mg Intravenous Daily   Continuous Infusions:  dextrose 5% lactated ringers 125 mL/hr at 01/13/23 0707   insulin 1.4 Units/hr (01/13/23 0705)   lactated ringers Stopped (01/12/23 1633)     LOS: 0 days   The patient is critically ill with multiple organ systems failure and requires high complexity decision making for assessment and support, frequent evaluation and titration of therapies, application of advanced monitoring technologies and extensive interpretation of multiple databases. Critical Care Time devoted to patient care services described in this note  Time spent: 40 minutes     Deirdra Heumann, Geraldo Docker, MD [facility]   If 7PM-7AM, please contact night-coverage 01/13/2023, 7:42 AM

## 2023-01-13 NOTE — Plan of Care (Signed)
Nutrition Education Note  RD consulted for nutrition education regarding diabetes.   Lab Results  Component Value Date   HGBA1C 11.8 (H) 01/12/2023    RD provided "Carbohydrate Counting for People with Diabetes" handout from the Academy of Nutrition and Dietetics. Discussed different food groups and their effects on blood sugar, emphasizing carbohydrate-containing foods. Provided list of carbohydrates and recommended serving sizes of common foods. Discussed importance of controlled and consistent carbohydrate intake throughout the day. Provided examples of ways to balance meals/snacks and encouraged intake of high-fiber, whole grain complex carbohydrates. Teach back method used. Expect good compliance.  Patient reports food recall as below: Breakfast/Lunch: snacks throughout the day on foods like bologna and cheese and crackers Dinner: meal with his family, often burgers and fries or pizza Supplements: Glucerna once daily (when available) Drinks: Powerade or Gatorade Zero  He reports he usually eats very well at home. Has been very hungry the past week. States he had a Dexcom but lost the transmitter so hasn't been able to use it recently. Notes he may have lost some weight over the past few days. Patient endorses understanding of what foods have carbohydrates. Looks at nutrition labels at home and understands carbohydrate counting. Feels confident he will be able to get back on track once he receives new transmitter for Dexcom, which was ordered for him by diabetes coordinator and should arrive in 2-3 days.  Body mass index is 18 kg/m. Pt meets criteria for underweight based on current BMI.  Current diet order is Carb Modified, no percent intake of meals documented at this time.  Will order Glucerna BID to support intake.  Labs and medications reviewed. No further nutrition interventions warranted at this time. RD contact information provided. If additional nutrition issues arise, please  re-consult RD.  Samson Frederic RD, LDN For contact information, refer to [facility]

## 2023-01-13 NOTE — Inpatient Diabetes Management (Addendum)
Inpatient Diabetes Program Recommendations  AACE/ADA: New Consensus Statement on Inpatient Glycemic Control (2015)  Target Ranges:  Prepandial:   less than 140 mg/dL      Peak postprandial:   less than 180 mg/dL (1-2 hours)      Critically ill patients:  140 - 180 mg/dL   Lab Results  Component Value Date   GLUCAP 146 (H) 01/13/2023   HGBA1C 11.8 (H) 01/12/2023    Review of Glycemic Control  Diabetes history: DM1 Outpatient Diabetes medications:  Per Endo-Dr. Tamala Julian should be taking-Levemir 8 units QD, Novolog 1 unit:15 CHO, 1 units:80>160 mg/dL & Dexcom Current orders for Inpatient glycemic control: IV insulin  Inpatient Diabetes Program Recommendations:    When MD is ready to transition to IV insulin, please consider:  Semglee 10 units QD (administer 2 hrs prior to discontinuing IV insulin) Novolog 0-9 units TID and 0-5 units QHS Novolog 3 units TID with meals if he consumes at least 50%  Will speak with him today regarding DM management.    Addendum@11$ :06:  Spoke with patient at bedside.  Reviewed patient's current A1c of 11.8% (average bg of 292 mg/dL). Explained what a A1c is and what it measures. Also reviewed goal A1c with patient, importance of good glucose control @ home, and blood sugar goals.  He states his grandmother passed away in September 30, 2023 and his family and home situation has gone downhill.  He has missed insulin doses and he lost hid transmitter for his Dexcom G6; he does have his receiver.   He removes it when he changes his CGM every 10 days and places it on the new CGM; it works for 3 months.  He has been unable to wear his CGM without the transmitter.  Educated him on trying to switch to the [place] which does not have a transmitter and his serum glucose goes straight to his phone. He does not have a cell phone so this will not work at this time.   Called Dexcom and asked for a replacement transmitter to be sent to his address on the chart.  He confirms this  address.  [employer]ex[employer] will be sending the transmitter via [employer]-Ex and will take 2-3 business days.  Notified patient of plan. He states he has more G6's at home and will start using once he receives the transmitter.   He does not drink caloric beverages.  He states this is his first admission with DKA.  He was diagnosed years ago.  Current with Dr. Tamala Julian with endocrinology at [facility].  He confirms above home medications.   Ordered the [facility] with DM booklet.    Will continue to follow while inpatient.  Thank you, Reche Dixon, MSN, [place] Diabetes Coordinator Inpatient Diabetes Program [phone] (team pager from 8a-5p)

## 2023-01-14 DIAGNOSIS — F1210 Cannabis abuse, uncomplicated: Secondary | ICD-10-CM | POA: Diagnosis not present

## 2023-01-14 DIAGNOSIS — F209 Schizophrenia, unspecified: Secondary | ICD-10-CM | POA: Diagnosis not present

## 2023-01-14 DIAGNOSIS — F329 Major depressive disorder, single episode, unspecified: Secondary | ICD-10-CM | POA: Diagnosis not present

## 2023-01-14 DIAGNOSIS — F1010 Alcohol abuse, uncomplicated: Secondary | ICD-10-CM | POA: Diagnosis not present

## 2023-01-14 LAB — PHOSPHORUS: Phosphorus: 2.2 mg/dL — ABNORMAL LOW (ref 2.5–4.6)

## 2023-01-14 LAB — CBC WITH DIFFERENTIAL/PLATELET
Abs Immature Granulocytes: 0.02 10*3/uL (ref 0.00–0.07)
Basophils Absolute: 0 10*3/uL (ref 0.0–0.1)
Basophils Relative: 0 %
Eosinophils Absolute: 0.1 10*3/uL (ref 0.0–0.5)
Eosinophils Relative: 1 %
HCT: 37.2 % — ABNORMAL LOW (ref 39.0–52.0)
Hemoglobin: 12.5 g/dL — ABNORMAL LOW (ref 13.0–17.0)
Immature Granulocytes: 0 %
Lymphocytes Relative: 41 %
Lymphs Abs: 3 10*3/uL (ref 0.7–4.0)
MCH: 33.2 pg (ref 26.0–34.0)
MCHC: 33.6 g/dL (ref 30.0–36.0)
MCV: 98.9 fL (ref 80.0–100.0)
Monocytes Absolute: 0.7 10*3/uL (ref 0.1–1.0)
Monocytes Relative: 9 %
Neutro Abs: 3.5 10*3/uL (ref 1.7–7.7)
Neutrophils Relative %: 49 %
Platelets: 247 10*3/uL (ref 150–400)
RBC: 3.76 MIL/uL — ABNORMAL LOW (ref 4.22–5.81)
RDW: 11.2 % — ABNORMAL LOW (ref 11.5–15.5)
WBC: 7.3 10*3/uL (ref 4.0–10.5)
nRBC: 0 % (ref 0.0–0.2)

## 2023-01-14 LAB — COMPREHENSIVE METABOLIC PANEL
ALT: 20 U/L (ref 0–44)
AST: 32 U/L (ref 15–41)
Albumin: 3.7 g/dL (ref 3.5–5.0)
Alkaline Phosphatase: 82 U/L (ref 38–126)
Anion gap: 10 (ref 5–15)
BUN: 12 mg/dL (ref 6–20)
CO2: 30 mmol/L (ref 22–32)
Calcium: 8.9 mg/dL (ref 8.9–10.3)
Chloride: 95 mmol/L — ABNORMAL LOW (ref 98–111)
Creatinine, Ser: 0.93 mg/dL (ref 0.61–1.24)
GFR, Estimated: >60 mL/min (ref >60)
Glucose, Bld: 120 mg/dL — ABNORMAL HIGH (ref 70–99)
Potassium: 3.1 mmol/L — ABNORMAL LOW (ref 3.5–5.1)
Sodium: 135 mmol/L (ref 135–145)
Total Bilirubin: 0.7 mg/dL (ref 0.3–1.2)
Total Protein: 5.9 g/dL — ABNORMAL LOW (ref 6.5–8.1)

## 2023-01-14 LAB — MAGNESIUM: Magnesium: 2.4 mg/dL (ref 1.7–2.4)

## 2023-01-14 LAB — GLUCOSE, CAPILLARY
Glucose-Capillary: 391 mg/dL — ABNORMAL HIGH (ref 70–99)
Glucose-Capillary: 112 mg/dL — ABNORMAL HIGH (ref 70–99)
Glucose-Capillary: 69 mg/dL — ABNORMAL LOW (ref 70–99)
Glucose-Capillary: 337 mg/dL — ABNORMAL HIGH (ref 70–99)
Glucose-Capillary: 297 mg/dL — ABNORMAL HIGH (ref 70–99)
Glucose-Capillary: 79 mg/dL (ref 70–99)

## 2023-01-14 MED ORDER — POTASSIUM PHOSPHATES 15 MMOLE/5ML IV SOLN
30.0000 mmol | Freq: Once | INTRAVENOUS | Status: AC
  Administered 2023-01-14: 30 mmol via INTRAVENOUS
  Filled 2023-01-14: qty 10

## 2023-01-14 NOTE — Inpatient Diabetes Management (Signed)
Inpatient Diabetes Program Recommendations  AACE/ADA: New Consensus Statement on Inpatient Glycemic Control (2015)  Target Ranges:  Prepandial:   less than 140 mg/dL      Peak postprandial:   less than 180 mg/dL (1-2 hours)      Critically ill patients:  140 - 180 mg/dL   Lab Results  Component Value Date   GLUCAP 112 (H) 01/14/2023   HGBA1C 11.8 (H) 01/12/2023    Review of Glycemic Control  Latest Reference Range & Units 01/13/23 20:12 01/13/23 23:37 01/14/23 03:28 01/14/23 07:38 01/14/23 08:17  Glucose-Capillary 70 - 99 mg/dL 185 (H) 174 (H) 391 (H) Novolog 15 units 69 (L) 112 (H)  (H): Data is abnormally high (L): Data is abnormally low  Diabetes history: DM1 Outpatient Diabetes medications:  Per Endo-Dr. Tamala Julian should be taking-Levemir 8 units QD, Novolog 1 unit:15 CHO, 1 units:80>160 mg/dL & Dexcom Current orders for Inpatient glycemic control: Semglee 10 units QD, Novolog 0-15 units TID  Inpatient Diabetes Program Recommendations:    Novolog 0-9 units TID and 0-5 units QHS Novolog 2-3 units TID with meals if he consumes at least 50%  Will continue to follow while inpatient.  Thank you, Reche Dixon, MSN, [place] Diabetes Coordinator Inpatient Diabetes Program [phone] (team pager from 8a-5p)

## 2023-01-14 NOTE — Progress Notes (Signed)
PROGRESS NOTE    Tom Anderson  [id number] DOB: 1971-10-27 DOA: 01/12/2023 PCP: Merryl Hacker, No   Brief Narrative:   Tom Anderson is a 52 y.o. WM PMHx  Schizophrenia,  Depression, Hx suicide ideation, asthma, Type 1 diabetes,, tardive dyskinesia,malnutrition, Hx AKI, Alcohol abuse, cannabis abuse, GERD without esophagitis, essential tremor  Presented to the emergency department with abdominal pain,, multiple episodes of emesis and hyperglycemia.  He stated he has missed a few insulin doses.  No  diarrhea, constipation, melena or hematochezia. He denied fever, chills, rhinorrhea, sore throat or hemoptysis.  He has frequent wheezing.  No chest pain, palpitations, diaphoresis, PND, orthopnea or pitting edema of the lower extremities.  No flank pain, dysuria, frequency or hematuria.  No polyuria, polydipsia, polyphagia or blurred vision.    Lab work: CBC showed white count of 14.3, hemoglobin 15.5 g/dL platelets 168.  Urinalysis showed glucosuria more than 500, ketonuria of 80 and proteinuria of 100 mg/deciliter.  There was trace hemoglobin hemoglobin and small bilirubin.  Rare bacteria microscopic examination.  Negative for coronavirus, influenza and RSV PCR.  Lactic acid is 2.0 and 1.1 mmol/L.  Lipase 113 units/L.  CMP showed CO2 of 15 mmol/L with an anion gap of 22.  Bilirubin of 1.9, calcium 8.4 and glucose 425 mg/dL.  The rest of the CMP measurements were normal.  That the hydroxybutyric acid was more than 8.0 mmol/L.  ED course: Initial vital signs were temperature 98.3 F, pulse 122, respirations 24, BP 111/96 mmHg and O2 sat 100% on room air.  The patient received IV fluids, ondansetron 4 mg IVP x 2, KCl 10 mEq IVP x 2 and was started on an insulin infusion.    Subjective: 2/15 A/O x 4, no complaints.   Assessment & Plan:  Covid vaccination;  Principal Problem:   DKA (diabetic ketoacidosis) ([place]) Active Problems:   Schizophrenia ([place])   Cannabis abuse   Alcohol abuse   Tardive  dyskinesia   Type 1 diabetes mellitus with hyperglycemia ([place])   Major depression   Cigarette nicotine dependence without complication   GERD without esophagitis  DM type I uncontrolled with Hyperglycemia ([place]) -2/13 Hemoglobin A1c= 11.8 -2/14 consult DM coordinator; patient will require new receiver for his Dexcom - 2/14 consult DM nutrition - 2/14 Levemir 10 units.  2 hours following administration of Levemir DC Endo tool -2/15 moderate SSI - 2/15 patient counseled by myself and DM coordinator that he will restart his home DM regimen.  Has been supplied with Dexcom by DM coordinator.  DKA (diabetic ketoacidosis) (H[facility]) -Observation/stepdown. -Endo tool (insulin infusion)  -2/14 Levemir 10 units.  2 hours following administration of Levemir DC Endo tool CBG (last 3)  Recent Labs    01/14/23 0738 01/14/23 0817 01/14/23 1235  GLUCAP 69* 112* 337*       Schizophrenia (HCC)/Major depression -Per MAR patient not on any medication for schizophrenia or depression. -Per H&P has not been followed by psychiatry. - 2/15 spoke at length with psychiatrist and given patient's lack of follow-up with his psychiatrist, recommends not starting medication as inpatient.  Recommends patient follow-up with [facility] for medication management.    Tardive dyskinesia -2/14 psych consult requested - Untreated schizophrenia, major depression, tardive dyskinesia, recommendation on medications. -2/15 see schizophrenia  Polysubstance abuse - 2/14 UDS positive TCH, opiates - 2/14 EtOH<10   Cannabis abuse -Cessation advised.  See polysubstance abuse -TOC has advised patient to also discontinue use of cannabis.   EtOH Abuse -Drinks 42 to 103  ounces of beer daily. -CIWA protocol   Cigarette nicotine dependence without complication -Smoking cessation advised. -2/14 declined nicotine replacement therapy x 3.   GERD without esophagitis -H2 blocker or PPI as  needed.   Hypokalemia - Potassium goal> 4 -2/14 K-Dur 40 mEq -2/15 K-Phos 30 mmol  Hypophosphatemia --Phosphorus goal>2.5 --2/15 see hypokalemia         Mobility Assessment (last 72 hours)     Mobility Assessment     Row Name 01/13/23 2036           Does patient have an order for bedrest or is patient medically unstable No - Continue assessment       What is the highest level of mobility based on the progressive mobility assessment? Level 6 (Walks independently in room and hall) - Balance while walking in room without assist - Complete                    DVT prophylaxis: Lovenox Code Status: Full Family Communication:  Status is: Inpatient    Dispo: The patient is from: Home              Anticipated d/c is to: Home              Anticipated d/c date is: 2 days              Patient currently is not medically stable to d/c.      Consultants:    Procedures/Significant Events:     I have personally reviewed and interpreted all radiology studies and my findings are as above.  VENTILATOR SETTINGS:    Cultures   Antimicrobials:    Devices    LINES / TUBES:      Continuous Infusions:     Objective: Vitals:   01/13/23 1620 01/13/23 2147 01/14/23 0335 01/14/23 1405  BP: 94/76 115/85 98/81 109/85  Pulse: 86 (!) 108 100 99  Resp: 16 20 16 17  $ Temp: 98 F (36.7 C) (!) 97.4 F (36.3 C) 97.6 F (36.4 C) 98.1 F (36.7 C)  TempSrc: Oral Oral Oral Oral  SpO2: 96% 99% 96% 99%  Weight:      Height:        Intake/Output Summary (Last 24 hours) at 01/14/2023 1542 Last data filed at 01/14/2023 0900 Gross per 24 hour  Intake 470 ml  Output --  Net 470 ml    Filed Weights   01/12/23 0839 01/12/23 1537  Weight: 63.5 kg 55.3 kg   Physical Exam:  General: A/O x 4, No acute respiratory distress Eyes: negative scleral hemorrhage, negative anisocoria, negative icterus ENT: Negative Runny nose, negative gingival bleeding, Neck:   Negative scars, masses, torticollis, lymphadenopathy, JVD Lungs: Clear to auscultation bilaterally without wheezes or crackles Cardiovascular: Regular rate and rhythm without murmur gallop or rub normal S1 and S2 Abdomen: negative abdominal pain, nondistended, positive soft, bowel sounds, no rebound, no ascites, no appreciable mass Extremities: No significant cyanosis, clubbing, or edema bilateral lower extremities Skin: Negative rashes, lesions, ulcers Psychiatric:  Negative depression, negative anxiety, negative fatigue, negative mania  Central nervous system:  Cranial nerves II through XII intact, tongue/uvula midline, all extremities muscle strength 5/5, sensation intact throughout, positive dysarthria, negative expressive aphasia, negative receptive aphasia.   .     Data Reviewed: Care during the described time interval was provided by me .  I have reviewed this patient's available data, including medical history, events of note, physical examination, and all test results as  part of my evaluation.   CBC: Recent Labs  Lab 01/12/23 0925 01/12/23 0927 01/12/23 1254 01/13/23 0335 01/14/23 0654  WBC 14.3*  --   --  9.4 7.3  NEUTROABS 11.3*  --   --   --  3.5  HGB 15.5 17.3* 15.3 12.5* 12.5*  HCT 43.5 51.0 45.0 35.5* 37.2*  MCV 94.4  --   --  95.7 98.9  PLT 168  --   --  322 [id number]    Basic Metabolic Panel: Recent Labs  Lab 01/12/23 1923 01/12/23 2349 01/13/23 0335 01/13/23 0758 01/14/23 0654  NA 132* 133* 131* 134* 135  K 3.4* 3.1* 3.2* 3.2* 3.1*  CL 98 97* 97* 99 95*  CO2 23 23 24 26 30  $ GLUCOSE 117* 114* 131* 109* 120*  BUN 16 13 13 12 12  $ CREATININE 0.98 0.93 0.99 0.83 0.93  CALCIUM 8.8* 8.4* 8.3* 8.5* 8.9  MG  --   --   --  2.2 2.4  PHOS  --   --   --   --  2.2*    GFR: Estimated Creatinine Clearance: 75.2 mL/min (by C-G formula based on SCr of 0.93 mg/dL). Liver Function Tests: Recent Labs  Lab 01/12/23 1027 01/13/23 0335 01/14/23 0654  AST 21 21 32  ALT  23 21 20  $ ALKPHOS 123 89 82  BILITOT 1.9* 1.0 0.7  PROT 6.8 5.9* 5.9*  ALBUMIN 4.0 3.5 3.7    Recent Labs  Lab 01/12/23 1027  LIPASE 113*    No results for input(s): "AMMONIA" in the last 168 hours. Coagulation Profile: No results for input(s): "INR", "PROTIME" in the last 168 hours. Cardiac Enzymes: No results for input(s): "CKTOTAL", "CKMB", "CKMBINDEX", "TROPONINI" in the last 168 hours. BNP (last 3 results) No results for input(s): "PROBNP" in the last 8760 hours. HbA1C: Recent Labs    01/12/23 1611  HGBA1C 11.8*    CBG: Recent Labs  Lab 01/13/23 2337 01/14/23 0328 01/14/23 0738 01/14/23 0817 01/14/23 1235  GLUCAP 174* 391* 69* 112* 337*    Lipid Profile: No results for input(s): "CHOL", "HDL", "LDLCALC", "TRIG", "CHOLHDL", "LDLDIRECT" in the last 72 hours. Thyroid Function Tests: No results for input(s): "TSH", "T4TOTAL", "FREET4", "T3FREE", "THYROIDAB" in the last 72 hours. Anemia Panel: No results for input(s): "VITAMINB12", "FOLATE", "FERRITIN", "TIBC", "IRON", "RETICCTPCT" in the last 72 hours. Urine analysis:    Component Value Date/Time   COLORURINE YELLOW 01/12/2023 1027   APPEARANCEUR CLEAR 01/12/2023 1027   LABSPEC >=1.030 01/12/2023 1027   PHURINE 5.0 01/12/2023 1027   GLUCOSEU >=500 (A) 01/12/2023 1027   HGBUR TRACE (A) 01/12/2023 1027   BILIRUBINUR SMALL (A) 01/12/2023 1027   KETONESUR >=80 (A) 01/12/2023 1027   PROTEINUR 100 (A) 01/12/2023 1027   NITRITE NEGATIVE 01/12/2023 1027   LEUKOCYTESUR NEGATIVE 01/12/2023 1027   Sepsis Labs: @LABRCNTIP$ (procalcitonin:4,lacticidven:4)  ) Recent Results (from the past 240 hour(s))  Resp panel by RT-PCR (RSV, Flu A&B, Covid) Anterior Nasal Swab     Status: None   Collection Time: 01/12/23 10:27 AM   Specimen: Anterior Nasal Swab  Result Value Ref Range Status   SARS Coronavirus 2 by RT PCR NEGATIVE NEGATIVE Final    Comment: (NOTE) SARS-CoV-2 target nucleic acids are NOT DETECTED.  The  SARS-CoV-2 RNA is generally detectable in upper respiratory specimens during the acute phase of infection. The lowest concentration of SARS-CoV-2 viral copies this assay can detect is 138 copies/mL. A negative result does not preclude SARS-Cov-2 infection and should not  be used as the sole basis for treatment or other patient management decisions. A negative result may occur with  improper specimen collection/handling, submission of specimen other than nasopharyngeal swab, presence of viral mutation(s) within the areas targeted by this assay, and inadequate number of viral copies(<138 copies/mL). A negative result must be combined with clinical observations, patient history, and epidemiological information. The expected result is Negative.  Fact Sheet for Patients:  EntrepreneurPulse.com.au  Fact Sheet for Healthcare Providers:  IncredibleEmployment.be  This test is no t yet approved or cleared by the [place] FDA and  has been authorized for detection and/or diagnosis of SARS-CoV-2 by FDA under an Emergency Use Authorization (EUA). This EUA will remain  in effect (meaning this test can be used) for the duration of the COVID-19 declaration under Section 564(b)(1) of the Act, 21 U.S.C.section 360bbb-3(b)(1), unless the authorization is terminated  or revoked sooner.       Influenza A by PCR NEGATIVE NEGATIVE Final   Influenza B by PCR NEGATIVE NEGATIVE Final    Comment: (NOTE) The Xpert Xpress SARS-CoV-2/FLU/RSV plus assay is intended as an aid in the diagnosis of influenza from Nasopharyngeal swab specimens and should not be used as a sole basis for treatment. Nasal washings and aspirates are unacceptable for Xpert Xpress SARS-CoV-2/FLU/RSV testing.  Fact Sheet for Patients: EntrepreneurPulse.com.au  Fact Sheet for Healthcare Providers: IncredibleEmployment.be  This test is not yet approved or  cleared by the [place] FDA and has been authorized for detection and/or diagnosis of SARS-CoV-2 by FDA under an Emergency Use Authorization (EUA). This EUA will remain in effect (meaning this test can be used) for the duration of the COVID-19 declaration under Section 564(b)(1) of the Act, 21 U.S.C. section 360bbb-3(b)(1), unless the authorization is terminated or revoked.     Resp Syncytial Virus by PCR NEGATIVE NEGATIVE Final    Comment: (NOTE) Fact Sheet for Patients: EntrepreneurPulse.com.au  Fact Sheet for Healthcare Providers: IncredibleEmployment.be  This test is not yet approved or cleared by the [place] FDA and has been authorized for detection and/or diagnosis of SARS-CoV-2 by FDA under an Emergency Use Authorization (EUA). This EUA will remain in effect (meaning this test can be used) for the duration of the COVID-19 declaration under Section 564(b)(1) of the Act, 21 U.S.C. section 360bbb-3(b)(1), unless the authorization is terminated or revoked.  Performed at [facility], [address] [place], [place] [address]          Radiology Studies: No results found.      Scheduled Meds:  Chlorhexidine Gluconate Cloth  6 each Topical Daily   enoxaparin (LOVENOX) injection  40 mg Subcutaneous Q24H   feeding supplement (GLUCERNA SHAKE)  237 mL Oral BID BM   folic acid  1 mg Oral Daily   insulin aspart  0-15 Units Subcutaneous Q4H   insulin detemir  10 Units Subcutaneous Daily   multivitamin with minerals  1 tablet Oral Daily   thiamine  100 mg Oral Daily   Or   thiamine  100 mg Intravenous Daily   Continuous Infusions:     LOS: 1 day   The patient is critically ill with multiple organ systems failure and requires high complexity decision making for assessment and support, frequent evaluation and titration of therapies, application of advanced monitoring technologies and extensive interpretation of  multiple databases. Critical Care Time devoted to patient care services described in this note  Time spent: 40 minutes     Allisa Einspahr J,  MD [facility]   If 7PM-7AM, please contact night-coverage 01/14/2023, 3:42 PM

## 2023-01-15 DIAGNOSIS — F329 Major depressive disorder, single episode, unspecified: Secondary | ICD-10-CM | POA: Diagnosis not present

## 2023-01-15 DIAGNOSIS — F1210 Cannabis abuse, uncomplicated: Secondary | ICD-10-CM | POA: Diagnosis not present

## 2023-01-15 DIAGNOSIS — F1010 Alcohol abuse, uncomplicated: Secondary | ICD-10-CM | POA: Diagnosis not present

## 2023-01-15 DIAGNOSIS — F209 Schizophrenia, unspecified: Secondary | ICD-10-CM | POA: Diagnosis not present

## 2023-01-15 LAB — COMPREHENSIVE METABOLIC PANEL
ALT: 19 U/L (ref 0–44)
AST: 28 U/L (ref 15–41)
Albumin: 3.5 g/dL (ref 3.5–5.0)
Alkaline Phosphatase: 85 U/L (ref 38–126)
Anion gap: 10 (ref 5–15)
BUN: 13 mg/dL (ref 6–20)
CO2: 30 mmol/L (ref 22–32)
Calcium: 9 mg/dL (ref 8.9–10.3)
Chloride: 96 mmol/L — ABNORMAL LOW (ref 98–111)
Creatinine, Ser: 0.84 mg/dL (ref 0.61–1.24)
GFR, Estimated: >60 mL/min (ref >60)
Glucose, Bld: 147 mg/dL — ABNORMAL HIGH (ref 70–99)
Potassium: 3.3 mmol/L — ABNORMAL LOW (ref 3.5–5.1)
Sodium: 136 mmol/L (ref 135–145)
Total Bilirubin: 0.6 mg/dL (ref 0.3–1.2)
Total Protein: 5.6 g/dL — ABNORMAL LOW (ref 6.5–8.1)

## 2023-01-15 LAB — CBC WITH DIFFERENTIAL/PLATELET
Abs Immature Granulocytes: 0.02 10*3/uL (ref 0.00–0.07)
Basophils Absolute: 0 10*3/uL (ref 0.0–0.1)
Basophils Relative: 1 %
Eosinophils Absolute: 0.1 10*3/uL (ref 0.0–0.5)
Eosinophils Relative: 1 %
HCT: 37.9 % — ABNORMAL LOW (ref 39.0–52.0)
Hemoglobin: 12.7 g/dL — ABNORMAL LOW (ref 13.0–17.0)
Immature Granulocytes: 0 %
Lymphocytes Relative: 49 %
Lymphs Abs: 3.1 10*3/uL (ref 0.7–4.0)
MCH: 33 pg (ref 26.0–34.0)
MCHC: 33.5 g/dL (ref 30.0–36.0)
MCV: 98.4 fL (ref 80.0–100.0)
Monocytes Absolute: 0.7 10*3/uL (ref 0.1–1.0)
Monocytes Relative: 11 %
Neutro Abs: 2.3 10*3/uL (ref 1.7–7.7)
Neutrophils Relative %: 38 %
Platelets: 263 10*3/uL (ref 150–400)
RBC: 3.85 MIL/uL — ABNORMAL LOW (ref 4.22–5.81)
RDW: 11 % — ABNORMAL LOW (ref 11.5–15.5)
WBC: 6.2 10*3/uL (ref 4.0–10.5)
nRBC: 0 % (ref 0.0–0.2)

## 2023-01-15 LAB — GLUCOSE, CAPILLARY
Glucose-Capillary: 129 mg/dL — ABNORMAL HIGH (ref 70–99)
Glucose-Capillary: 240 mg/dL — ABNORMAL HIGH (ref 70–99)
Glucose-Capillary: 354 mg/dL — ABNORMAL HIGH (ref 70–99)
Glucose-Capillary: 355 mg/dL — ABNORMAL HIGH (ref 70–99)

## 2023-01-15 LAB — PHOSPHORUS: Phosphorus: 4.3 mg/dL (ref 2.5–4.6)

## 2023-01-15 LAB — MAGNESIUM: Magnesium: 2.7 mg/dL — ABNORMAL HIGH (ref 1.7–2.4)

## 2023-01-15 MED ORDER — ADULT MULTIVITAMIN W/MINERALS CH: 1.0000 | ORAL_TABLET | Freq: Every day | ORAL | 0 refills | Status: DC

## 2023-01-15 MED ORDER — FOLIC ACID 1 MG PO TABS: 1.0000 mg | ORAL_TABLET | Freq: Every day | ORAL | 0 refills | Status: DC

## 2023-01-15 MED ORDER — POTASSIUM CHLORIDE CRYS ER 10 MEQ PO TBCR: 50.0000 meq | EXTENDED_RELEASE_TABLET | Freq: Once | ORAL | 0 refills | Status: DC

## 2023-01-15 MED ORDER — VITAMIN B-1 100 MG PO TABS: 100.0000 mg | ORAL_TABLET | Freq: Every day | ORAL | 0 refills | Status: DC

## 2023-01-15 MED ORDER — POTASSIUM CHLORIDE CRYS ER 20 MEQ PO TBCR
50.0000 meq | EXTENDED_RELEASE_TABLET | Freq: Once | ORAL | Status: AC
  Administered 2023-01-15: 50 meq via ORAL
  Filled 2023-01-15: qty 1

## 2023-01-15 MED ORDER — GLUCERNA SHAKE PO LIQD: 237.0000 mL | Freq: Two times a day (BID) | ORAL | 0 refills | Status: DC

## 2023-01-15 NOTE — Discharge Summary (Signed)
Physician Discharge Summary  Tom Anderson [id number] DOB: March 12, 1971 DOA: 01/12/2023  PCP: Pcp, No  Admit date: 01/12/2023 Discharge date: 01/15/2023  Time spent: 30 minutes  Recommendations for Outpatient Follow-up:   DM type I uncontrolled with Hyperglycemia ([place]) -2/13 Hemoglobin A1c= 11.8 -2/14 consult DM coordinator; patient will require new receiver for his Dexcom - 2/14 consult DM nutrition - 2/14 Levemir 10 units.  2 hours following administration of Levemir DC Endo tool -2/15 moderate SSI - 2/15 patient counseled by myself and DM coordinator that he will restart his home DM regimen.  Has been supplied with Dexcom by DM coordinator.   DKA (diabetic ketoacidosis) (HCC) -Observation/stepdown. -Endo tool (insulin infusion)  -2/14 Levemir 10 units.  2 hours following administration of Levemir DC Endo tool CBG (last 3)  Recent Labs    01/15/23 0027 01/15/23 0323 01/15/23 0757  GLUCAP 240* 129* 354*  -Resolved  Schizophrenia (HCC)/Major depression -Per MAR patient not on any medication for schizophrenia or depression. -Per H&P has not been followed by psychiatry. - 2/15 spoke at length with psychiatrist and given patient's lack of follow-up with his psychiatrist, recommends not starting medication as inpatient.  Recommends patient follow-up with [facility] for medication management.    Tardive dyskinesia -2/14 psych consult requested - Untreated schizophrenia, major depression, tardive dyskinesia, recommendation on medications. -2/15 see schizophrenia   Polysubstance abuse - 2/14 UDS positive TCH, opiates - 2/14 EtOH<10   Cannabis abuse -Cessation advised.  See polysubstance abuse -TOC has advised patient to also discontinue use of cannabis.   EtOH Abuse -Drinks 42 to 84 ounces of beer daily. -CIWA protocol   Cigarette nicotine dependence without complication -Smoking cessation advised. -2/14 declined nicotine replacement therapy x 3.   GERD  without esophagitis -H2 blocker or PPI as needed.     Hypokalemia - Potassium goal> 4 -2/14 K-Dur 40 mEq -2/15 K-Phos 30 mmol -K-Dur 50 mEq prior to discharge   Hypophosphatemia --Phosphorus goal>2.5 --2/15 see hypokalemia   Discharge Diagnoses:  Principal Problem:   DKA (diabetic ketoacidosis) ([place]) Active Problems:   Schizophrenia ([place])   Cannabis abuse   Alcohol abuse   Tardive dyskinesia   Type 1 diabetes mellitus with hyperglycemia (HCC)   Major depression   Cigarette nicotine dependence without complication   GERD without esophagitis   Discharge Condition: Stable  Diet recommendation: Carb modified   Filed Weights   01/12/23 0839 01/12/23 1537  Weight: 63.5 kg 55.3 kg    History of present illness:  ***  Hospital Course:  ***  Procedures: *** (i.e. Studies not automatically included, echos, thoracentesis, etc; not x-rays)  Consultations: ***  Cultures  ***  Antibiotics ***   Discharge Exam: Vitals:   01/14/23 0335 01/14/23 1405 01/14/23 2040 01/15/23 0643  BP: 98/81 109/85 114/85 115/84  Pulse: 100 99 97 94  Resp: 16 17 14 16  $ Temp: 97.6 F (36.4 C) 98.1 F (36.7 C) 97.7 F (36.5 C) 97.8 F (36.6 C)  TempSrc: Oral Oral Oral Oral  SpO2: 96% 99% 100% 96%  Weight:      Height:        General: A/O x 4, No acute respiratory distress Eyes: negative scleral hemorrhage, negative anisocoria, negative icterus ENT: Negative Runny nose, negative gingival bleeding, Neck:  Negative scars, masses, torticollis, lymphadenopathy, JVD Lungs: Clear to auscultation bilaterally without wheezes or crackles Cardiovascular: Regular rate and rhythm without murmur gallop or rub normal S1 and S2  Discharge Instructions   Allergies as of 01/15/2023  Reactions   Nitrous Oxide Nausea And Vomiting        Medication List     TAKE these medications    feeding supplement (GLUCERNA SHAKE) Liqd Take 237 mLs by mouth 2 (two) times daily between  meals.   folic acid 1 MG tablet Commonly known as: FOLVITE Take 1 tablet (1 mg total) by mouth daily.   insulin aspart 100 UNIT/ML FlexPen Commonly known as: NOVOLOG For glucose 121 to 150 use 1 unit, for 151 to 200 use 2 units, for 201 to 250 use 3 units, for 251 to 300 use 5 units, for 301 to 350 use 7 units for 351 or greater use 9 units. What changed:  how much to take how to take this when to take this additional instructions   insulin detemir 100 UNIT/ML FlexPen Commonly known as: LEVEMIR Inject 15 Units into the skin daily. What changed:  how much to take when to take this   multivitamin with minerals Tabs tablet Take 1 tablet by mouth daily.   ondansetron 4 MG tablet Commonly known as: ZOFRAN Take 1 tablet (4 mg total) by mouth every 6 (six) hours as needed for nausea or vomiting.   potassium chloride 10 MEQ tablet Commonly known as: KLOR-CON M Take 5 tablets (50 mEq total) by mouth once for 1 dose.   thiamine 100 MG tablet Commonly known as: Vitamin B-1 Take 1 tablet (100 mg total) by mouth daily.   TUMS PO Take 1 tablet by mouth daily as needed (heartburn).   Ventolin HFA 108 (90 Base) MCG/ACT inhaler Generic drug: albuterol Inhale 2 puffs into the lungs as needed for shortness of breath or wheezing.       Allergies  Allergen Reactions   Nitrous Oxide Nausea And Vomiting    Follow-up Information     [facility] Follow up.   Why: developmental psychology and therapy Contact information: [place] [address] [phone]                  The results of significant diagnostics from this hospitalization (including imaging, microbiology, ancillary and laboratory) are listed below for reference.    Significant Diagnostic Studies: CT ABDOMEN PELVIS W CONTRAST  Result Date: 12/17/2022 CLINICAL DATA:  Abdominal pain, acute, nonlocalized EXAM: CT ABDOMEN AND PELVIS WITH CONTRAST TECHNIQUE:  Multidetector CT imaging of the abdomen and pelvis was performed using the standard protocol following bolus administration of intravenous contrast. RADIATION DOSE REDUCTION: This exam was performed according to the departmental dose-optimization program which includes automated exposure control, adjustment of the mA and/or kV according to patient size and/or use of iterative reconstruction technique. CONTRAST:  149m OMNIPAQUE IOHEXOL 300 MG/ML  SOLN COMPARISON:  Ct abd/pelvis 11/12/22 FINDINGS: Lower chest: No acute abnormality. Hepatobiliary: Vague 1.9 cm hypodensity within the lateral left hepatic lobe along the false form ligament likely represents focal fatty infiltration. No gallstones, gallbladder wall thickening, or pericholecystic fluid. No biliary dilatation. Pancreas: No focal lesion. Normal pancreatic contour. No surrounding inflammatory changes. No main pancreatic ductal dilatation. Spleen: Normal in size without focal abnormality. Adrenals/Urinary Tract: No adrenal nodule bilaterally. Bilateral kidneys enhance symmetrically. Subcentimeter hypodensities are too small to characterize-no further follow-up indicated. No hydronephrosis. No hydroureter. The urinary bladder is unremarkable. Stomach/Bowel: Stomach is within normal limits. No evidence of bowel wall thickening or dilatation. Appendix appears normal. Vascular/Lymphatic: No abdominal aorta or iliac aneurysm. Mild atherosclerotic plaque of the aorta and its branches. No abdominal, pelvic, or inguinal lymphadenopathy.  Reproductive: Prostate is unremarkable. Other: No intraperitoneal free fluid. No intraperitoneal free gas. No organized fluid collection. Musculoskeletal: No abdominal wall hernia or abnormality. No suspicious lytic or blastic osseous lesions. No acute displaced fracture. Chronic T11 anterior wedge compression fracture. IMPRESSION: 1. No acute intra-abdominal or intrapelvic abnormality. 2.  Aortic Atherosclerosis (ICD10-I70.0).  Electronically Signed   By: Iven Finn M.D.   On: 12/17/2022 19:58   DG Chest 2 View  Result Date: 12/17/2022 CLINICAL DATA:  Provided history: Generalized body aches.  Nausea. EXAM: CHEST - 2 VIEW COMPARISON:  Chest radiographs 10/04/2020 and earlier. FINDINGS: Heart size within normal limits. Mild blunting of the bilateral costophrenic angles, new from the prior examination of 10/04/2020. Small bilateral pleural effusions cannot be excluded. No appreciable airspace consolidation or pulmonary edema. No evidence of pneumothorax. Dextrocurvature of the thoracic spine. Unchanged mild chronic anterior wedge deformity of the T12 vertebral body. IMPRESSION: No appreciable airspace consolidation or pulmonary edema. Mild blunting of the bilateral costophrenic angles, new from the prior examination of 10/04/2020. Small bilateral pleural effusions cannot be excluded. Dextrocurvature of the thoracic spine. Electronically Signed   By: Kellie Simmering D.O.   On: 12/17/2022 15:49    Microbiology: Recent Results (from the past 240 hour(s))  Resp panel by RT-PCR (RSV, Flu A&B, Covid) Anterior Nasal Swab     Status: None   Collection Time: 01/12/23 10:27 AM   Specimen: Anterior Nasal Swab  Result Value Ref Range Status   SARS Coronavirus 2 by RT PCR NEGATIVE NEGATIVE Final    Comment: (NOTE) SARS-CoV-2 target nucleic acids are NOT DETECTED.  The SARS-CoV-2 RNA is generally detectable in upper respiratory specimens during the acute phase of infection. The lowest concentration of SARS-CoV-2 viral copies this assay can detect is 138 copies/mL. A negative result does not preclude SARS-Cov-2 infection and should not be used as the sole basis for treatment or other patient management decisions. A negative result may occur with  improper specimen collection/handling, submission of specimen other than nasopharyngeal swab, presence of viral mutation(s) within the areas targeted by this assay, and inadequate number  of viral copies(<138 copies/mL). A negative result must be combined with clinical observations, patient history, and epidemiological information. The expected result is Negative.  Fact Sheet for Patients:  EntrepreneurPulse.com.au  Fact Sheet for Healthcare Providers:  IncredibleEmployment.be  This test is no t yet approved or cleared by the [place] FDA and  has been authorized for detection and/or diagnosis of SARS-CoV-2 by FDA under an Emergency Use Authorization (EUA). This EUA will remain  in effect (meaning this test can be used) for the duration of the COVID-19 declaration under Section 564(b)(1) of the Act, 21 U.S.C.section 360bbb-3(b)(1), unless the authorization is terminated  or revoked sooner.       Influenza A by PCR NEGATIVE NEGATIVE Final   Influenza B by PCR NEGATIVE NEGATIVE Final    Comment: (NOTE) The Xpert Xpress SARS-CoV-2/FLU/RSV plus assay is intended as an aid in the diagnosis of influenza from Nasopharyngeal swab specimens and should not be used as a sole basis for treatment. Nasal washings and aspirates are unacceptable for Xpert Xpress SARS-CoV-2/FLU/RSV testing.  Fact Sheet for Patients: EntrepreneurPulse.com.au  Fact Sheet for Healthcare Providers: IncredibleEmployment.be  This test is not yet approved or cleared by the [place] FDA and has been authorized for detection and/or diagnosis of SARS-CoV-2 by FDA under an Emergency Use Authorization (EUA). This EUA will remain in effect (meaning this test can be used) for the duration  of the COVID-19 declaration under Section 564(b)(1) of the Act, 21 U.S.C. section 360bbb-3(b)(1), unless the authorization is terminated or revoked.     Resp Syncytial Virus by PCR NEGATIVE NEGATIVE Final    Comment: (NOTE) Fact Sheet for Patients: EntrepreneurPulse.com.au  Fact Sheet for Healthcare  Providers: IncredibleEmployment.be  This test is not yet approved or cleared by the [place] FDA and has been authorized for detection and/or diagnosis of SARS-CoV-2 by FDA under an Emergency Use Authorization (EUA). This EUA will remain in effect (meaning this test can be used) for the duration of the COVID-19 declaration under Section 564(b)(1) of the Act, 21 U.S.C. section 360bbb-3(b)(1), unless the authorization is terminated or revoked.  Performed at [facility], [address] [place], [place] [address]      Labs: Basic Metabolic Panel: Recent Labs  Lab 01/12/23 2349 01/13/23 0335 01/13/23 0758 01/14/23 0654 01/15/23 0625  NA 133* 131* 134* 135 136  K 3.1* 3.2* 3.2* 3.1* 3.3*  CL 97* 97* 99 95* 96*  CO2 23 24 26 30 30  $ GLUCOSE 114* 131* 109* 120* 147*  BUN 13 13 12 12 13  $ CREATININE 0.93 0.99 0.83 0.93 0.84  CALCIUM 8.4* 8.3* 8.5* 8.9 9.0  MG  --   --  2.2 2.4 2.7*  PHOS  --   --   --  2.2* 4.3   Liver Function Tests: Recent Labs  Lab 01/12/23 1027 01/13/23 0335 01/14/23 0654 01/15/23 0625  AST 21 21 32 28  ALT 23 21 20 19  $ ALKPHOS 123 89 82 85  BILITOT 1.9* 1.0 0.7 0.6  PROT 6.8 5.9* 5.9* 5.6*  ALBUMIN 4.0 3.5 3.7 3.5   Recent Labs  Lab 01/12/23 1027  LIPASE 113*   No results for input(s): "AMMONIA" in the last 168 hours. CBC: Recent Labs  Lab 01/12/23 0925 01/12/23 0927 01/12/23 1254 01/13/23 0335 01/14/23 0654 01/15/23 0625  WBC 14.3*  --   --  9.4 7.3 6.2  NEUTROABS 11.3*  --   --   --  3.5 2.3  HGB 15.5 17.3* 15.3 12.5* 12.5* 12.7*  HCT 43.5 51.0 45.0 35.5* 37.2* 37.9*  MCV 94.4  --   --  95.7 98.9 98.4  PLT 168  --   --  322 247 263   Cardiac Enzymes: No results for input(s): "CKTOTAL", "CKMB", "CKMBINDEX", "TROPONINI" in the last 168 hours. BNP: BNP (last 3 results) No results for input(s): "BNP" in the last 8760 hours.  ProBNP (last 3 results) No results for input(s): "PROBNP" in the last  8760 hours.  CBG: Recent Labs  Lab 01/14/23 1656 01/14/23 2041 01/15/23 0027 01/15/23 0323 01/15/23 0757  GLUCAP 297* 79 240* 129* 354*       Signed:  Dia Crawford, MD [facility]

## 2023-01-15 NOTE — Progress Notes (Signed)
Pt CBG at 0757 was 354 and covered by 11 units of novoLOG insulin. Pt called nurses station at 0935 and stated that he wasn't feeling well. Bedside nurse rechecked CBG at 0940 with a result of 355. Provider notified via secure chat and no additional sliding scale coverage was added.   Provider put in DC orders at 0919. Bedside nurse messaged the provider to clarify that he was okay with discharging the patient without seeing him first. Provider stated that the patient did not want to see him and to recheck CBG as the patient is leaving.  Bedside nurse followed up with patient and patient stated that he did want to see the provider and has questions about Levemir prior to discharge. Provider notified.

## 2023-01-21 LAB — I-STAT VENOUS BLOOD GAS, ED
Acid-base deficit: 9 mmol/L — ABNORMAL HIGH (ref 0.0–2.0)
Bicarbonate: 16.2 mmol/L — ABNORMAL LOW (ref 20.0–28.0)
Calcium, Ion: 1.01 mmol/L — ABNORMAL LOW (ref 1.15–1.40)
HCT: 51 % (ref 39.0–52.0)
Hemoglobin: 17.3 g/dL — ABNORMAL HIGH (ref 13.0–17.0)
O2 Saturation: 42 %
Patient temperature: 98.3
Potassium: >8.5 mmol/L (ref 3.5–5.1)
Sodium: 120 mmol/L — ABNORMAL LOW (ref 135–145)
TCO2: 17 mmol/L — ABNORMAL LOW (ref 22–32)
pCO2, Ven: 33.7 mmHg — ABNORMAL LOW (ref 44–60)
pH, Ven: 7.29 (ref 7.25–7.43)
pO2, Ven: 26 mmHg — CL (ref 32–45)

## 2023-02-09 ENCOUNTER — Emergency Department (HOSPITAL_BASED_OUTPATIENT_CLINIC_OR_DEPARTMENT_OTHER): Payer: Medicaid Other

## 2023-02-09 ENCOUNTER — Other Ambulatory Visit: Payer: Self-pay

## 2023-02-09 ENCOUNTER — Inpatient Hospital Stay (HOSPITAL_BASED_OUTPATIENT_CLINIC_OR_DEPARTMENT_OTHER)
Admission: EM | Admit: 2023-02-09 | Discharge: 2023-02-12 | DRG: 637 | Disposition: A | Discharge status: Home / Self Care | Payer: Medicaid Other | Attending: Internal Medicine | Admitting: Internal Medicine

## 2023-02-09 ENCOUNTER — Encounter (HOSPITAL_BASED_OUTPATIENT_CLINIC_OR_DEPARTMENT_OTHER): Payer: Self-pay

## 2023-02-09 DIAGNOSIS — E860 Dehydration: Secondary | ICD-10-CM | POA: Diagnosis present

## 2023-02-09 DIAGNOSIS — Z833 Family history of diabetes mellitus: Secondary | ICD-10-CM

## 2023-02-09 DIAGNOSIS — E876 Hypokalemia: Secondary | ICD-10-CM | POA: Diagnosis present

## 2023-02-09 DIAGNOSIS — E1110 Type 2 diabetes mellitus with ketoacidosis without coma: Secondary | ICD-10-CM | POA: Diagnosis present

## 2023-02-09 DIAGNOSIS — G250 Essential tremor: Secondary | ICD-10-CM | POA: Diagnosis present

## 2023-02-09 DIAGNOSIS — E1065 Type 1 diabetes mellitus with hyperglycemia: Secondary | ICD-10-CM

## 2023-02-09 DIAGNOSIS — F329 Major depressive disorder, single episode, unspecified: Secondary | ICD-10-CM | POA: Diagnosis present

## 2023-02-09 DIAGNOSIS — E119 Type 2 diabetes mellitus without complications: Secondary | ICD-10-CM

## 2023-02-09 DIAGNOSIS — R030 Elevated blood-pressure reading, without diagnosis of hypertension: Secondary | ICD-10-CM | POA: Diagnosis present

## 2023-02-09 DIAGNOSIS — R000 Tachycardia, unspecified: Secondary | ICD-10-CM | POA: Diagnosis present

## 2023-02-09 DIAGNOSIS — E871 Hypo-osmolality and hyponatremia: Secondary | ICD-10-CM | POA: Diagnosis present

## 2023-02-09 DIAGNOSIS — R7401 Elevation of levels of liver transaminase levels: Secondary | ICD-10-CM | POA: Diagnosis present

## 2023-02-09 DIAGNOSIS — N179 Acute kidney failure, unspecified: Secondary | ICD-10-CM | POA: Diagnosis present

## 2023-02-09 DIAGNOSIS — F17210 Nicotine dependence, cigarettes, uncomplicated: Secondary | ICD-10-CM | POA: Diagnosis present

## 2023-02-09 DIAGNOSIS — Z794 Long term (current) use of insulin: Secondary | ICD-10-CM

## 2023-02-09 DIAGNOSIS — E1010 Type 1 diabetes mellitus with ketoacidosis without coma: Principal | ICD-10-CM

## 2023-02-09 DIAGNOSIS — Z79899 Other long term (current) drug therapy: Secondary | ICD-10-CM

## 2023-02-09 DIAGNOSIS — F1210 Cannabis abuse, uncomplicated: Secondary | ICD-10-CM | POA: Diagnosis present

## 2023-02-09 DIAGNOSIS — F209 Schizophrenia, unspecified: Secondary | ICD-10-CM | POA: Diagnosis present

## 2023-02-09 DIAGNOSIS — R339 Retention of urine, unspecified: Secondary | ICD-10-CM | POA: Diagnosis present

## 2023-02-09 DIAGNOSIS — E1165 Type 2 diabetes mellitus with hyperglycemia: Secondary | ICD-10-CM

## 2023-02-09 DIAGNOSIS — U071 COVID-19: Secondary | ICD-10-CM

## 2023-02-09 DIAGNOSIS — D72829 Elevated white blood cell count, unspecified: Secondary | ICD-10-CM | POA: Diagnosis present

## 2023-02-09 DIAGNOSIS — Z91148 Patient's other noncompliance with medication regimen for other reason: Secondary | ICD-10-CM

## 2023-02-09 DIAGNOSIS — R319 Hematuria, unspecified: Secondary | ICD-10-CM | POA: Diagnosis not present

## 2023-02-09 DIAGNOSIS — Y846 Urinary catheterization as the cause of abnormal reaction of the patient, or of later complication, without mention of misadventure at the time of the procedure: Secondary | ICD-10-CM | POA: Diagnosis not present

## 2023-02-09 DIAGNOSIS — J45909 Unspecified asthma, uncomplicated: Secondary | ICD-10-CM | POA: Diagnosis present

## 2023-02-09 DIAGNOSIS — D649 Anemia, unspecified: Secondary | ICD-10-CM | POA: Diagnosis present

## 2023-02-09 DIAGNOSIS — T8383XA Hemorrhage of genitourinary prosthetic devices, implants and grafts, initial encounter: Secondary | ICD-10-CM | POA: Diagnosis not present

## 2023-02-09 LAB — BETA-HYDROXYBUTYRIC ACID: Beta-Hydroxybutyric Acid: >8 mmol/L — ABNORMAL HIGH (ref 0.05–0.27)

## 2023-02-09 LAB — RAPID URINE DRUG SCREEN, HOSP PERFORMED
Amphetamines: NOT DETECTED
Barbiturates: NOT DETECTED
Benzodiazepines: NOT DETECTED
Cocaine: NOT DETECTED
Opiates: NOT DETECTED
Tetrahydrocannabinol: POSITIVE — AB

## 2023-02-09 LAB — CBC WITH DIFFERENTIAL/PLATELET
Abs Immature Granulocytes: 0.36 10*3/uL — ABNORMAL HIGH (ref 0.00–0.07)
Basophils Absolute: 0.1 10*3/uL (ref 0.0–0.1)
Basophils Relative: 1 %
Eosinophils Absolute: 0 10*3/uL (ref 0.0–0.5)
Eosinophils Relative: 0 %
HCT: 46.1 % (ref 39.0–52.0)
Hemoglobin: 14.7 g/dL (ref 13.0–17.0)
Immature Granulocytes: 3 %
Lymphocytes Relative: 10 %
Lymphs Abs: 1.4 10*3/uL (ref 0.7–4.0)
MCH: 33.2 pg (ref 26.0–34.0)
MCHC: 31.9 g/dL (ref 30.0–36.0)
MCV: 104.1 fL — ABNORMAL HIGH (ref 80.0–100.0)
Monocytes Absolute: 0.7 10*3/uL (ref 0.1–1.0)
Monocytes Relative: 5 %
Neutro Abs: 11.4 10*3/uL — ABNORMAL HIGH (ref 1.7–7.7)
Neutrophils Relative %: 81 %
Platelets: 360 10*3/uL (ref 150–400)
RBC: 4.43 MIL/uL (ref 4.22–5.81)
RDW: 12.2 % (ref 11.5–15.5)
WBC: 14 10*3/uL — ABNORMAL HIGH (ref 4.0–10.5)
nRBC: 0 % (ref 0.0–0.2)

## 2023-02-09 LAB — URINALYSIS, ROUTINE W REFLEX MICROSCOPIC
Bilirubin Urine: NEGATIVE
Glucose, UA: >=500 mg/dL — AB
Ketones, ur: >=80 mg/dL — AB
Leukocytes,Ua: NEGATIVE
Nitrite: NEGATIVE
Protein, ur: 30 mg/dL — AB
Specific Gravity, Urine: 1.02 (ref 1.005–1.030)
pH: 5 (ref 5.0–8.0)

## 2023-02-09 LAB — HEPATIC FUNCTION PANEL
ALT: 78 U/L — ABNORMAL HIGH (ref 0–44)
AST: 74 U/L — ABNORMAL HIGH (ref 15–41)
Albumin: 4.4 g/dL (ref 3.5–5.0)
Alkaline Phosphatase: 229 U/L — ABNORMAL HIGH (ref 38–126)
Bilirubin, Direct: 0.1 mg/dL (ref 0.0–0.2)
Indirect Bilirubin: 1.6 mg/dL — ABNORMAL HIGH (ref 0.3–0.9)
Total Bilirubin: 1.7 mg/dL — ABNORMAL HIGH (ref 0.3–1.2)
Total Protein: 8 g/dL (ref 6.5–8.1)

## 2023-02-09 LAB — I-STAT VENOUS BLOOD GAS, ED
Acid-base deficit: 23 mmol/L — ABNORMAL HIGH (ref 0.0–2.0)
Bicarbonate: 7 mmol/L — ABNORMAL LOW (ref 20.0–28.0)
Calcium, Ion: 1.01 mmol/L — ABNORMAL LOW (ref 1.15–1.40)
HCT: 50 % (ref 39.0–52.0)
Hemoglobin: 17 g/dL (ref 13.0–17.0)
O2 Saturation: 68 %
Patient temperature: 98
Potassium: 5.2 mmol/L — ABNORMAL HIGH (ref 3.5–5.1)
Sodium: 121 mmol/L — ABNORMAL LOW (ref 135–145)
TCO2: 8 mmol/L — ABNORMAL LOW (ref 22–32)
pCO2, Ven: 27.1 mmHg — ABNORMAL LOW (ref 44–60)
pH, Ven: 7.02 — CL (ref 7.25–7.43)
pO2, Ven: 50 mmHg — ABNORMAL HIGH (ref 32–45)

## 2023-02-09 LAB — URINALYSIS, MICROSCOPIC (REFLEX): WBC, UA: NONE SEEN WBC/hpf (ref 0–5)

## 2023-02-09 LAB — BASIC METABOLIC PANEL WITH GFR
Anion gap: 24 — ABNORMAL HIGH (ref 5–15)
BUN: 24 mg/dL — ABNORMAL HIGH (ref 6–20)
CO2: 9 mmol/L — ABNORMAL LOW (ref 22–32)
Calcium: 7.7 mg/dL — ABNORMAL LOW (ref 8.9–10.3)
Chloride: 95 mmol/L — ABNORMAL LOW (ref 98–111)
Creatinine, Ser: 1.57 mg/dL — ABNORMAL HIGH (ref 0.61–1.24)
GFR, Estimated: 54 mL/min — ABNORMAL LOW
Glucose, Bld: 556 mg/dL (ref 70–99)
Potassium: 3.3 mmol/L — ABNORMAL LOW (ref 3.5–5.1)
Sodium: 131 mmol/L — ABNORMAL LOW (ref 135–145)

## 2023-02-09 LAB — CBG MONITORING, ED
Glucose-Capillary: 290 mg/dL — ABNORMAL HIGH (ref 70–99)
Glucose-Capillary: 307 mg/dL — ABNORMAL HIGH (ref 70–99)
Glucose-Capillary: 330 mg/dL — ABNORMAL HIGH (ref 70–99)
Glucose-Capillary: 360 mg/dL — ABNORMAL HIGH (ref 70–99)
Glucose-Capillary: 428 mg/dL — ABNORMAL HIGH (ref 70–99)
Glucose-Capillary: 526 mg/dL (ref 70–99)
Glucose-Capillary: 595 mg/dL (ref 70–99)
Glucose-Capillary: >600 mg/dL (ref 70–99)
Glucose-Capillary: >600 mg/dL (ref 70–99)
Glucose-Capillary: >600 mg/dL (ref 70–99)
Glucose-Capillary: >600 mg/dL (ref 70–99)
Glucose-Capillary: >600 mg/dL (ref 70–99)

## 2023-02-09 LAB — BASIC METABOLIC PANEL
Anion gap: 37 — ABNORMAL HIGH (ref 5–15)
BUN: 28 mg/dL — ABNORMAL HIGH (ref 6–20)
CO2: 7 mmol/L — ABNORMAL LOW (ref 22–32)
Calcium: 8.3 mg/dL — ABNORMAL LOW (ref 8.9–10.3)
Chloride: 79 mmol/L — ABNORMAL LOW (ref 98–111)
Creatinine, Ser: 1.97 mg/dL — ABNORMAL HIGH (ref 0.61–1.24)
GFR, Estimated: 41 mL/min — ABNORMAL LOW (ref >60)
Glucose, Bld: 1017 mg/dL (ref 70–99)
Potassium: 5.1 mmol/L (ref 3.5–5.1)
Sodium: 123 mmol/L — ABNORMAL LOW (ref 135–145)

## 2023-02-09 LAB — RESP PANEL BY RT-PCR (RSV, FLU A&B, COVID)  RVPGX2
Influenza A by PCR: NEGATIVE
Influenza B by PCR: NEGATIVE
Resp Syncytial Virus by PCR: NEGATIVE
SARS Coronavirus 2 by RT PCR: POSITIVE — AB

## 2023-02-09 LAB — LIPASE, BLOOD: Lipase: 30 U/L (ref 11–51)

## 2023-02-09 LAB — ETHANOL: Alcohol, Ethyl (B): <10 mg/dL

## 2023-02-09 MED ORDER — LACTATED RINGERS IV BOLUS
2000.0000 mL | Freq: Once | INTRAVENOUS | Status: AC
  Administered 2023-02-09: 1000 mL via INTRAVENOUS

## 2023-02-09 MED ORDER — LACTATED RINGERS IV BOLUS: 20.0000 mL/kg | Freq: Once | INTRAVENOUS | Status: DC

## 2023-02-09 MED ORDER — POTASSIUM CHLORIDE CRYS ER 20 MEQ PO TBCR
40.0000 meq | EXTENDED_RELEASE_TABLET | Freq: Once | ORAL | Status: AC
  Administered 2023-02-09: 40 meq via ORAL
  Filled 2023-02-09: qty 2

## 2023-02-09 MED ORDER — DEXTROSE IN LACTATED RINGERS 5 % IV SOLN: INTRAVENOUS | Status: DC

## 2023-02-09 MED ORDER — MORPHINE SULFATE (PF) 4 MG/ML IV SOLN
4.0000 mg | Freq: Once | INTRAVENOUS | Status: AC
  Administered 2023-02-09: 4 mg via INTRAVENOUS
  Filled 2023-02-09: qty 1

## 2023-02-09 MED ORDER — INSULIN REGULAR(HUMAN) IN NACL 100-0.9 UT/100ML-% IV SOLN
INTRAVENOUS | Status: DC
  Administered 2023-02-09: 9.5 [IU]/h via INTRAVENOUS
  Administered 2023-02-09: 6.5 [IU]/h via INTRAVENOUS
  Filled 2023-02-09: qty 100

## 2023-02-09 MED ORDER — DIAZEPAM 5 MG/ML IJ SOLN
5.0000 mg | Freq: Once | INTRAMUSCULAR | Status: AC
  Administered 2023-02-09: 5 mg via INTRAVENOUS
  Filled 2023-02-09: qty 2

## 2023-02-09 MED ORDER — LACTATED RINGERS IV SOLN: INTRAVENOUS | Status: DC

## 2023-02-09 MED ORDER — DEXTROSE 50 % IV SOLN: 0.0000 mL | INTRAVENOUS | Status: DC | PRN

## 2023-02-09 MED ORDER — LACTATED RINGERS IV BOLUS
20.0000 mL/kg | Freq: Once | INTRAVENOUS | Status: AC
  Administered 2023-02-09: 1270 mL via INTRAVENOUS

## 2023-02-09 MED ORDER — POTASSIUM CHLORIDE 10 MEQ/100ML IV SOLN
10.0000 meq | Freq: Once | INTRAVENOUS | Status: AC
  Administered 2023-02-09: 10 meq via INTRAVENOUS
  Filled 2023-02-09: qty 100

## 2023-02-09 NOTE — ED Notes (Addendum)
Patient had a bowel movement in the bed. Clean sheets, blankets, gown, socks

## 2023-02-09 NOTE — ED Provider Notes (Signed)
  Physical Exam  BP (!) 143/110   Pulse (!) 122   Temp 98 F (36.7 C)   Resp (!) 26   Ht 5\' 9"  (1.753 m)   Wt 63.5 kg   SpO2 100%   BMI 20.67 kg/m   Physical Exam Constitutional:      Comments: Mentating appropriately, disheveled  Cardiovascular:     Rate and Rhythm: Tachycardia present.  Abdominal:     Palpations: Abdomen is soft.     Procedures  .Critical Care  Performed by: Elgie Congo, MD Authorized by: Elgie Congo, MD   Critical care provider statement:    Critical care time (minutes):  45   Critical care was necessary to treat or prevent imminent or life-threatening deterioration of the following conditions:  Metabolic crisis   Critical care was time spent personally by me on the following activities:  Development of treatment plan with patient or surrogate, discussions with consultants, evaluation of patient's response to treatment, examination of patient, ordering and review of laboratory studies, ordering and review of radiographic studies, ordering and performing treatments and interventions, pulse oximetry, re-evaluation of patient's condition, review of old charts and obtaining history from patient or surrogate   Care discussed with: admitting provider     ED Course / MDM   Clinical Course as of 02/09/23 1836  Tue Feb 09, 2023  1354 Glucose-Capillary(!!): >600 [JL]  1453 pH, Ven(!!): 7.020 [JL]  1454 Potassium(!): 5.2 [JL]  1558 Schizophrenic, ETOH/cannabis abuse, noncompliant now with DKA. Symptoms were nausea, vomiting, feeling unwell.  Leukocytosis 14.  pH 7.02.  Mild transaminitis is C74 ALT 78 alk phos 229 and T. bili 1.7.  Lipase still pending.  CMP still pending.  Getting fluids.  Followed up with Endo tool. [VB]  6578 Patient's BMP finally resulted concerning for glucose 1017 anion gap 37 bicarbonate 7.  He also pseudohyponatremia 123.  Chloride is 79.  Creatinine 1.97 AKI almost double normal.  Endo tool to be started now.  Has received  2270 ml IVF.  [VB]  4696 Reassessed patient, he is tachycardic but mentating appropriately.  Not tremulous, last alcohol use about a month ago, not concern for active withdrawal.  Abdominal exam is benign.  CTAP without contrast without any acute findings other than distended bladder which we are placing Foley for.  Spoke with hospitalist requesting ICU consult [VB]  [phone] Spoke with Dr. Tacy Learn of critical care, he says patient is safe for stepdown admission at this time based off my assessment and workup. [VB]  8413 Will be admitted under stepdown hospitalist. [VB]    Clinical Course User Index [JL] Regan Lemming, MD [VB] Elgie Congo, MD   Medical Decision Making Amount and/or Complexity of Data Reviewed Labs: ordered. Decision-making details documented in ED Course. Radiology: ordered.  Risk Prescription drug management. Decision regarding hospitalization.          Elgie Congo, MD 02/09/23 [phone]

## 2023-02-09 NOTE — ED Notes (Signed)
Attempted PIV x 2, flash returned, veins blew; having 2nd RN attempt

## 2023-02-09 NOTE — ED Notes (Signed)
Pt c/o generalized pain, MD notified

## 2023-02-09 NOTE — ED Provider Notes (Signed)
[facility] [place] Provider Note   CSN: [id number] Arrival date & time: 02/09/23  1337     History  Chief Complaint  Patient presents with   Abdominal Pain    Tom Anderson is a 53 y.o. male.   Abdominal Pain    52 year old male with medical history significant for schizophrenia, type 1 diabetes and DKA who presents to the emergency department with concern for DKA.  The patient is a diabetic and lost his blood glucose meter about a week ago.  He has not been fully compliant with his medications.  He endorses nausea and vomiting, polyuria and polydipsia and has been increasingly short of breath.  No fevers or chills.  Remainder of history limited by patient acuity.  Home Medications Prior to Admission medications   Medication Sig Start Date End Date Taking? Authorizing Provider  Calcium Carbonate Antacid (TUMS PO) Take 1 tablet by mouth daily as needed (heartburn).    [provider]  feeding supplement, GLUCERNA SHAKE, (GLUCERNA SHAKE) LIQD Take 237 mLs by mouth 2 (two) times daily between meals. 01/15/23   Allie Bossier, MD  folic acid (FOLVITE) 1 MG tablet Take 1 tablet (1 mg total) by mouth daily. 01/15/23   Allie Bossier, MD  insulin aspart (NOVOLOG) 100 UNIT/ML FlexPen For glucose 121 to 150 use 1 unit, for 151 to 200 use 2 units, for 201 to 250 use 3 units, for 251 to 300 use 5 units, for 301 to 350 use 7 units for 351 or greater use 9 units. Patient taking differently: Inject 1-18 Units into the skin in the morning, at noon, in the evening, and at bedtime. 10/07/20   [name], Jimmy Picket, MD  insulin detemir (LEVEMIR) 100 UNIT/ML FlexPen Inject 15 Units into the skin daily. Patient taking differently: Inject 8 Units into the skin at bedtime. 10/07/20   [name], Jimmy Picket, MD  Multiple Vitamin (MULTIVITAMIN WITH MINERALS) TABS tablet Take 1 tablet by mouth daily. 01/15/23   Allie Bossier, MD  ondansetron (ZOFRAN) 4 MG  tablet Take 1 tablet (4 mg total) by mouth every 6 (six) hours as needed for nausea or vomiting. Patient not taking: Reported on 01/13/2023 12/17/22   Azucena Cecil, PA-C  potassium chloride (KLOR-CON M) 10 MEQ tablet Take 5 tablets (50 mEq total) by mouth once for 1 dose. 01/15/23 01/15/23  Allie Bossier, MD  thiamine (VITAMIN B-1) 100 MG tablet Take 1 tablet (100 mg total) by mouth daily. 01/15/23   Allie Bossier, MD  VENTOLIN HFA 108 [phone] Base) MCG/ACT inhaler Inhale 2 puffs into the lungs as needed for shortness of breath or wheezing. 11/20/22   [provider]      Allergies    Nitrous oxide    Review of Systems   Review of Systems  Unable to perform ROS: Acuity of condition  Gastrointestinal:  Positive for abdominal pain.    Physical Exam Updated Vital Signs BP (!) 168/113   Pulse (!) 103   Temp 98 F (36.7 C)   Resp (!) 24   Ht '5\' 9"'$  (1.753 m)   Wt 63.5 kg   SpO2 99%   BMI 20.67 kg/m  Physical Exam Vitals and nursing note reviewed.  Constitutional:      General: He is in acute distress.     Appearance: He is well-developed.  HENT:     Head: Normocephalic and atraumatic.     Mouth/Throat:  Mouth: Mucous membranes are dry.  Eyes:     Conjunctiva/sclera: Conjunctivae normal.  Cardiovascular:     Rate and Rhythm: Regular rhythm. Tachycardia present.  Pulmonary:     Effort: Pulmonary effort is normal. Tachypnea present. No respiratory distress.     Breath sounds: Normal breath sounds.     Comments: Kussmaul respirations. Abdominal:     Palpations: Abdomen is soft.     Tenderness: There is no abdominal tenderness.  Musculoskeletal:        General: No swelling.     Cervical back: Neck supple.  Skin:    General: Skin is warm and dry.     Capillary Refill: Capillary refill takes less than 2 seconds.  Neurological:     General: No focal deficit present.     Mental Status: He is alert and oriented to person, place, and time.     Cranial Nerves: No  cranial nerve deficit.     Motor: No weakness.  Psychiatric:        Mood and Affect: Mood normal.     ED Results / Procedures / Treatments   Labs (all labs ordered are listed, but only abnormal results are displayed) Labs Reviewed  CBC WITH DIFFERENTIAL/PLATELET - Abnormal; Notable for the following components:      Result Value   WBC 14.0 (*)    MCV 104.1 (*)    Neutro Abs 11.4 (*)    Abs Immature Granulocytes 0.36 (*)    All other components within normal limits  HEPATIC FUNCTION PANEL - Abnormal; Notable for the following components:   AST 74 (*)    ALT 78 (*)    Alkaline Phosphatase 229 (*)    Total Bilirubin 1.7 (*)    Indirect Bilirubin 1.6 (*)    All other components within normal limits  CBG MONITORING, ED - Abnormal; Notable for the following components:   Glucose-Capillary >600 (*)    All other components within normal limits  I-STAT VENOUS BLOOD GAS, ED - Abnormal; Notable for the following components:   pH, Ven 7.020 (*)    pCO2, Ven 27.1 (*)    pO2, Ven 50 (*)    Bicarbonate 7.0 (*)    TCO2 8 (*)    Acid-base deficit 23.0 (*)    Sodium 121 (*)    Potassium 5.2 (*)    Calcium, Ion 1.01 (*)    All other components within normal limits  RESP PANEL BY RT-PCR (RSV, FLU A&B, COVID)  [id number]  BASIC METABOLIC PANEL  BASIC METABOLIC PANEL  BASIC METABOLIC PANEL  BASIC METABOLIC PANEL  BETA-HYDROXYBUTYRIC ACID  BETA-HYDROXYBUTYRIC ACID  URINALYSIS, ROUTINE W REFLEX MICROSCOPIC  LIPASE, BLOOD  CBG MONITORING, ED    EKG EKG Interpretation  Date/Time:  Tuesday February 09 2023 14:09:24 EDT Ventricular Rate:  111 PR Interval:  139 QRS Duration: 122 QT Interval:  355 QTC Calculation: 483 R Axis:   96 Text Interpretation: Sinus tachycardia Nonspecific intraventricular conduction delay Probable lateral infarct, old Confirmed by Regan Lemming ([id number]) on 02/09/2023 2:16:38 PM  Radiology DG Chest Portable 1 View  Result Date: 02/09/2023 CLINICAL DATA:  Shortness  of breath, abdominal pain EXAM: PORTABLE CHEST 1 VIEW COMPARISON:  12/17/2022 FINDINGS: Two frontal views of the chest demonstrate an unremarkable cardiac silhouette. No airspace disease, effusion, or pneumothorax. Chronic healing left posterior seventh rib fracture. IMPRESSION: 1. No acute intrathoracic process. Electronically Signed   By: Randa Ngo M.D.   On: 02/09/2023 14:59    Procedures .Critical  Care  Performed by: Regan Lemming, MD Authorized by: Regan Lemming, MD   Critical care provider statement:    Critical care time (minutes):  30   Critical care was time spent personally by me on the following activities:  Development of treatment plan with patient or surrogate, discussions with consultants, evaluation of patient's response to treatment, examination of patient, ordering and review of laboratory studies, ordering and review of radiographic studies, ordering and performing treatments and interventions, pulse oximetry, re-evaluation of patient's condition and review of old charts     Medications Ordered in ED Medications  lactated ringers bolus 1,270 mL (has no administration in time range)  insulin regular, human (MYXREDLIN) 100 units/ 100 mL infusion (has no administration in time range)  lactated ringers infusion (has no administration in time range)  dextrose 5 % in lactated ringers infusion (has no administration in time range)  dextrose 50 % solution 0-50 mL (has no administration in time range)  lactated ringers bolus 2,000 mL (1,000 mLs Intravenous New Bag/Given 02/09/23 1445)    ED Course/ Medical Decision Making/ A&P Clinical Course as of 02/09/23 1522  Tue Feb 09, 2023  1354 Glucose-Capillary(!!): >600 [JL]  1453 pH, Ven(!!): 7.020 [JL]  1454 Potassium(!): 5.2 [JL]    Clinical Course User Index [JL] Regan Lemming, MD                             Medical Decision Making Amount and/or Complexity of Data Reviewed Labs: ordered. Decision-making details  documented in ED Course. Radiology: ordered.  Risk Prescription drug management.     52 year old male with medical history significant for schizophrenia, type 1 diabetes and DKA who presents to the emergency department with concern for DKA.  The patient is a diabetic and lost his blood glucose meter about a week ago.  He has not been fully compliant with his medications.  He endorses nausea and vomiting, polyuria and polydipsia and has been increasingly short of breath.  No fevers or chills.  Remainder of history limited by patient acuity.  On arrival, the patient was afebrile, temperature 98, tachycardic heart rate 124, tachypneic RR in the 20s with Kussmaul respirations noted, dry mucous membranes noted on exam.  Concern for DKA.  Patient's initial CBG was greater than 600.  Bilateral IV access was obtained, ultrasound-guided IV utilized for access and the patient was administered 2 L of IV fluids for volume resuscitation.  His pH returned significant for a metabolic acidosis with a pH of 7.02 and a potassium of 5.2.  He was started on an insulin infusion for DKA.  Plan to follow-up remainder of the patient's laboratory evaluation, plan for likely admission for DKA.  Likely triggered due to medication noncompliance  Signout given to Dr. Nechama Guard at [phone].   Final Clinical Impression(s) / ED Diagnoses Final diagnoses:  Diabetic ketoacidosis without coma associated with type 1 diabetes mellitus [facility])    Rx / DC Orders ED Discharge Orders     None         Regan Lemming, MD 02/09/23 [phone]

## 2023-02-09 NOTE — ED Triage Notes (Signed)
Pt c/o shortness of breath and abdominal pain and pain all over. Pt reports that he is diabetic and has lost his meter about a week ago.

## 2023-02-10 DIAGNOSIS — U071 COVID-19: Secondary | ICD-10-CM

## 2023-02-10 DIAGNOSIS — N179 Acute kidney failure, unspecified: Secondary | ICD-10-CM | POA: Diagnosis present

## 2023-02-10 DIAGNOSIS — R339 Retention of urine, unspecified: Secondary | ICD-10-CM | POA: Diagnosis present

## 2023-02-10 DIAGNOSIS — Y846 Urinary catheterization as the cause of abnormal reaction of the patient, or of later complication, without mention of misadventure at the time of the procedure: Secondary | ICD-10-CM | POA: Diagnosis not present

## 2023-02-10 DIAGNOSIS — R030 Elevated blood-pressure reading, without diagnosis of hypertension: Secondary | ICD-10-CM | POA: Diagnosis present

## 2023-02-10 DIAGNOSIS — E871 Hypo-osmolality and hyponatremia: Secondary | ICD-10-CM | POA: Diagnosis present

## 2023-02-10 DIAGNOSIS — R319 Hematuria, unspecified: Secondary | ICD-10-CM | POA: Diagnosis not present

## 2023-02-10 DIAGNOSIS — Z91148 Patient's other noncompliance with medication regimen for other reason: Secondary | ICD-10-CM | POA: Diagnosis not present

## 2023-02-10 DIAGNOSIS — D72829 Elevated white blood cell count, unspecified: Secondary | ICD-10-CM | POA: Diagnosis present

## 2023-02-10 DIAGNOSIS — E1065 Type 1 diabetes mellitus with hyperglycemia: Secondary | ICD-10-CM | POA: Diagnosis not present

## 2023-02-10 DIAGNOSIS — F1210 Cannabis abuse, uncomplicated: Secondary | ICD-10-CM | POA: Diagnosis present

## 2023-02-10 DIAGNOSIS — T8383XA Hemorrhage of genitourinary prosthetic devices, implants and grafts, initial encounter: Secondary | ICD-10-CM | POA: Diagnosis not present

## 2023-02-10 DIAGNOSIS — G250 Essential tremor: Secondary | ICD-10-CM | POA: Diagnosis present

## 2023-02-10 DIAGNOSIS — F329 Major depressive disorder, single episode, unspecified: Secondary | ICD-10-CM | POA: Diagnosis present

## 2023-02-10 DIAGNOSIS — Z833 Family history of diabetes mellitus: Secondary | ICD-10-CM | POA: Diagnosis not present

## 2023-02-10 DIAGNOSIS — Z794 Long term (current) use of insulin: Secondary | ICD-10-CM | POA: Diagnosis not present

## 2023-02-10 DIAGNOSIS — R000 Tachycardia, unspecified: Secondary | ICD-10-CM | POA: Diagnosis present

## 2023-02-10 DIAGNOSIS — D649 Anemia, unspecified: Secondary | ICD-10-CM | POA: Diagnosis present

## 2023-02-10 DIAGNOSIS — R7401 Elevation of levels of liver transaminase levels: Secondary | ICD-10-CM | POA: Diagnosis present

## 2023-02-10 DIAGNOSIS — Z79899 Other long term (current) drug therapy: Secondary | ICD-10-CM | POA: Diagnosis not present

## 2023-02-10 DIAGNOSIS — J45909 Unspecified asthma, uncomplicated: Secondary | ICD-10-CM | POA: Diagnosis present

## 2023-02-10 DIAGNOSIS — E1110 Type 2 diabetes mellitus with ketoacidosis without coma: Secondary | ICD-10-CM

## 2023-02-10 DIAGNOSIS — F17210 Nicotine dependence, cigarettes, uncomplicated: Secondary | ICD-10-CM | POA: Diagnosis present

## 2023-02-10 DIAGNOSIS — E860 Dehydration: Secondary | ICD-10-CM | POA: Diagnosis present

## 2023-02-10 DIAGNOSIS — E876 Hypokalemia: Secondary | ICD-10-CM | POA: Diagnosis present

## 2023-02-10 DIAGNOSIS — F209 Schizophrenia, unspecified: Secondary | ICD-10-CM | POA: Diagnosis present

## 2023-02-10 DIAGNOSIS — E1010 Type 1 diabetes mellitus with ketoacidosis without coma: Secondary | ICD-10-CM | POA: Diagnosis present

## 2023-02-10 LAB — CBC WITH DIFFERENTIAL/PLATELET
Abs Immature Granulocytes: 0.14 10*3/uL — ABNORMAL HIGH (ref 0.00–0.07)
Basophils Absolute: 0 10*3/uL (ref 0.0–0.1)
Basophils Relative: 0 %
Eosinophils Absolute: 0 10*3/uL (ref 0.0–0.5)
Eosinophils Relative: 0 %
HCT: 34 % — ABNORMAL LOW (ref 39.0–52.0)
Hemoglobin: 11.5 g/dL — ABNORMAL LOW (ref 13.0–17.0)
Immature Granulocytes: 1 %
Lymphocytes Relative: 18 %
Lymphs Abs: 3.1 10*3/uL (ref 0.7–4.0)
MCH: 32.8 pg (ref 26.0–34.0)
MCHC: 33.8 g/dL (ref 30.0–36.0)
MCV: 96.9 fL (ref 80.0–100.0)
Monocytes Absolute: 1.2 10*3/uL — ABNORMAL HIGH (ref 0.1–1.0)
Monocytes Relative: 7 %
Neutro Abs: 12.5 10*3/uL — ABNORMAL HIGH (ref 1.7–7.7)
Neutrophils Relative %: 74 %
Platelets: 273 10*3/uL (ref 150–400)
RBC: 3.51 MIL/uL — ABNORMAL LOW (ref 4.22–5.81)
RDW: 11.5 % (ref 11.5–15.5)
WBC: 17 10*3/uL — ABNORMAL HIGH (ref 4.0–10.5)
nRBC: 0 % (ref 0.0–0.2)

## 2023-02-10 LAB — VOLATILES,BLD-ACETONE,ETHANOL,ISOPROP,METHANOL
Acetone, blood: 0.04 g/dL — ABNORMAL HIGH (ref 0.000–0.010)
Ethanol, blood: <0.01 g/dL (ref 0.000–0.010)
Isopropanol, blood: <0.01 g/dL (ref 0.000–0.010)
Methanol, blood: <0.01 g/dL (ref 0.000–0.010)

## 2023-02-10 LAB — GLUCOSE, CAPILLARY
Glucose-Capillary: 194 mg/dL — ABNORMAL HIGH (ref 70–99)
Glucose-Capillary: 191 mg/dL — ABNORMAL HIGH (ref 70–99)
Glucose-Capillary: 142 mg/dL — ABNORMAL HIGH (ref 70–99)
Glucose-Capillary: 160 mg/dL — ABNORMAL HIGH (ref 70–99)
Glucose-Capillary: 153 mg/dL — ABNORMAL HIGH (ref 70–99)
Glucose-Capillary: 128 mg/dL — ABNORMAL HIGH (ref 70–99)
Glucose-Capillary: 113 mg/dL — ABNORMAL HIGH (ref 70–99)
Glucose-Capillary: 174 mg/dL — ABNORMAL HIGH (ref 70–99)
Glucose-Capillary: 197 mg/dL — ABNORMAL HIGH (ref 70–99)
Glucose-Capillary: 193 mg/dL — ABNORMAL HIGH (ref 70–99)
Glucose-Capillary: 103 mg/dL — ABNORMAL HIGH (ref 70–99)

## 2023-02-10 LAB — BASIC METABOLIC PANEL
Anion gap: 19 — ABNORMAL HIGH (ref 5–15)
Anion gap: 10 (ref 5–15)
Anion gap: 4 — ABNORMAL LOW (ref 5–15)
BUN: 23 mg/dL — ABNORMAL HIGH (ref 6–20)
BUN: 22 mg/dL — ABNORMAL HIGH (ref 6–20)
BUN: 20 mg/dL (ref 6–20)
CO2: 17 mmol/L — ABNORMAL LOW (ref 22–32)
CO2: 24 mmol/L (ref 22–32)
CO2: 24 mmol/L (ref 22–32)
Calcium: 7.9 mg/dL — ABNORMAL LOW (ref 8.9–10.3)
Calcium: 8.2 mg/dL — ABNORMAL LOW (ref 8.9–10.3)
Calcium: 8 mg/dL — ABNORMAL LOW (ref 8.9–10.3)
Chloride: 95 mmol/L — ABNORMAL LOW (ref 98–111)
Chloride: 94 mmol/L — ABNORMAL LOW (ref 98–111)
Chloride: 99 mmol/L (ref 98–111)
Creatinine, Ser: 1.35 mg/dL — ABNORMAL HIGH (ref 0.61–1.24)
Creatinine, Ser: 0.9 mg/dL (ref 0.61–1.24)
Creatinine, Ser: 0.73 mg/dL (ref 0.61–1.24)
GFR, Estimated: >60 mL/min (ref >60)
GFR, Estimated: >60 mL/min (ref >60)
GFR, Estimated: >60 mL/min (ref >60)
Glucose, Bld: 325 mg/dL — ABNORMAL HIGH (ref 70–99)
Glucose, Bld: 166 mg/dL — ABNORMAL HIGH (ref 70–99)
Glucose, Bld: 143 mg/dL — ABNORMAL HIGH (ref 70–99)
Potassium: 3.8 mmol/L (ref 3.5–5.1)
Potassium: 3.8 mmol/L (ref 3.5–5.1)
Potassium: 3.6 mmol/L (ref 3.5–5.1)
Sodium: 131 mmol/L — ABNORMAL LOW (ref 135–145)
Sodium: 128 mmol/L — ABNORMAL LOW (ref 135–145)
Sodium: 127 mmol/L — ABNORMAL LOW (ref 135–145)

## 2023-02-10 LAB — CBG MONITORING, ED
Glucose-Capillary: 215 mg/dL — ABNORMAL HIGH (ref 70–99)
Glucose-Capillary: 219 mg/dL — ABNORMAL HIGH (ref 70–99)

## 2023-02-10 LAB — PHOSPHORUS: Phosphorus: 2.3 mg/dL — ABNORMAL LOW (ref 2.5–4.6)

## 2023-02-10 LAB — MAGNESIUM: Magnesium: 1.9 mg/dL (ref 1.7–2.4)

## 2023-02-10 LAB — BETA-HYDROXYBUTYRIC ACID: Beta-Hydroxybutyric Acid: 1.12 mmol/L — ABNORMAL HIGH (ref 0.05–0.27)

## 2023-02-10 LAB — MRSA NEXT GEN BY PCR, NASAL: MRSA by PCR Next Gen: NOT DETECTED

## 2023-02-10 MED ORDER — INSULIN ASPART 100 UNIT/ML IJ SOLN
4.0000 [IU] | Freq: Three times a day (TID) | INTRAMUSCULAR | Status: DC
  Administered 2023-02-11 – 2023-02-12 (×3): 4 [IU] via SUBCUTANEOUS

## 2023-02-10 MED ORDER — ENOXAPARIN SODIUM 40 MG/0.4ML IJ SOSY
40.0000 mg | PREFILLED_SYRINGE | INTRAMUSCULAR | Status: DC
  Administered 2023-02-10: 40 mg via SUBCUTANEOUS
  Filled 2023-02-10: qty 0.4

## 2023-02-10 MED ORDER — INSULIN ASPART 100 UNIT/ML IJ SOLN: 0.0000 [IU] | Freq: Three times a day (TID) | INTRAMUSCULAR | Status: DC

## 2023-02-10 MED ORDER — INSULIN ASPART 100 UNIT/ML IJ SOLN: 0.0000 [IU] | Freq: Every day | INTRAMUSCULAR | Status: DC

## 2023-02-10 MED ORDER — ONDANSETRON HCL 4 MG/2ML IJ SOLN
4.0000 mg | Freq: Four times a day (QID) | INTRAMUSCULAR | Status: DC | PRN
  Administered 2023-02-11 – 2023-02-12 (×3): 4 mg via INTRAVENOUS
  Filled 2023-02-10 (×3): qty 2

## 2023-02-10 MED ORDER — HYDROMORPHONE HCL 1 MG/ML IJ SOLN
0.5000 mg | INTRAMUSCULAR | Status: DC | PRN
  Administered 2023-02-10 – 2023-02-12 (×8): 0.5 mg via INTRAVENOUS
  Filled 2023-02-10 (×8): qty 1

## 2023-02-10 MED ORDER — CHLORHEXIDINE GLUCONATE CLOTH 2 % EX PADS
6.0000 | MEDICATED_PAD | Freq: Every day | CUTANEOUS | Status: DC
  Administered 2023-02-12: 6 via TOPICAL

## 2023-02-10 MED ORDER — ORAL CARE MOUTH RINSE: 15.0000 mL | OROMUCOSAL | Status: DC | PRN

## 2023-02-10 MED ORDER — ACETAMINOPHEN 650 MG RE SUPP: 650.0000 mg | Freq: Four times a day (QID) | RECTAL | Status: DC | PRN

## 2023-02-10 MED ORDER — INSULIN ASPART 100 UNIT/ML IJ SOLN
0.0000 [IU] | Freq: Three times a day (TID) | INTRAMUSCULAR | Status: DC
  Administered 2023-02-10 (×2): 2 [IU] via SUBCUTANEOUS
  Administered 2023-02-11: 7 [IU] via SUBCUTANEOUS
  Administered 2023-02-11: 2 [IU] via SUBCUTANEOUS
  Administered 2023-02-12: 5 [IU] via SUBCUTANEOUS

## 2023-02-10 MED ORDER — INSULIN DETEMIR 100 UNIT/ML ~~LOC~~ SOLN
8.0000 [IU] | Freq: Every day | SUBCUTANEOUS | Status: DC
  Administered 2023-02-10: 8 [IU] via SUBCUTANEOUS
  Filled 2023-02-10 (×2): qty 0.08

## 2023-02-10 MED ORDER — GLUCERNA SHAKE PO LIQD
237.0000 mL | Freq: Three times a day (TID) | ORAL | Status: DC
  Administered 2023-02-10 – 2023-02-12 (×4): 237 mL via ORAL
  Filled 2023-02-10 (×9): qty 237

## 2023-02-10 MED ORDER — ACETAMINOPHEN 325 MG PO TABS
650.0000 mg | ORAL_TABLET | Freq: Four times a day (QID) | ORAL | Status: DC | PRN
  Administered 2023-02-10 – 2023-02-12 (×3): 650 mg via ORAL
  Filled 2023-02-10 (×3): qty 2

## 2023-02-10 MED ORDER — NALOXONE HCL 0.4 MG/ML IJ SOLN: 0.4000 mg | INTRAMUSCULAR | Status: DC | PRN

## 2023-02-10 NOTE — TOC Initial Note (Signed)
Transition of Care [facility]) - Initial/Assessment Note    Patient Details  Name: Tom Anderson MRN: [id number] Date of Birth: Jun 23, 1971  [facility]) CM/SW Contact:    Dessa Phi, RN Phone Number: 02/10/2023, 3:33 PM  Clinical Narrative:Monitor for d/c plans.                   Expected Discharge Plan: Home/Self Care Barriers to Discharge: Continued Medical Work up   Patient Goals and CMS Choice            Expected Discharge Plan and Services                                              Prior Living Arrangements/Services                       Activities of Daily Living Home Assistive Devices/Equipment: None ADL Screening (condition at time of admission) Patient's cognitive ability adequate to safely complete daily activities?: Yes Is the patient deaf or have difficulty hearing?: No Does the patient have difficulty seeing, even when wearing glasses/contacts?: No Does the patient have difficulty concentrating, remembering, or making decisions?: Yes Patient able to express need for assistance with ADLs?: Yes Does the patient have difficulty dressing or bathing?: No Independently performs ADLs?: Yes (appropriate for developmental age) Does the patient have difficulty walking or climbing stairs?: No Weakness of Legs: None Weakness of Arms/Hands: None  Permission Sought/Granted                  Emotional Assessment              Admission diagnosis:  DKA (diabetic ketoacidosis) ([place]) [E11.10] AKI (acute kidney injury) ([place]) [N17.9] Diabetic ketoacidosis without coma associated with type 1 diabetes mellitus ([place]) [E10.10] Patient Active Problem List   Diagnosis Date Noted   Hyponatremia 02/10/2023   COVID-19 virus infection 02/10/2023   Type 1 diabetes mellitus with hyperglycemia ([place]) 11/10/2021   GERD without esophagitis 11/04/2020   Malnutrition of moderate degree 10/07/2020   AKI (acute kidney injury) ([place]) 10/06/2020    Acute respiratory failure with hypoxia and hypercapnia ([place]) Q000111Q   Acute metabolic encephalopathy Q000111Q   T2DM (type 2 diabetes mellitus) ([place]) 10/06/2020   T1DM (type 1 diabetes mellitus) ([place]) 10/06/2020   Schizophrenia ([place]) 10/06/2020   Acute GI bleeding    High anion gap metabolic acidosis    DKA (diabetic ketoacidosis) ([place]) 10/03/2020   Cannabis abuse 10/14/2016   Alcohol abuse 10/14/2016   Suicidal ideation 10/14/2016   Epigastric pain 12/24/2015   Dyspepsia 12/24/2015   Chronic fatigue 12/22/2015   Tardive dyskinesia 12/22/2015   Major depression 12/22/2015   Essential tremor 12/22/2015   Cigarette nicotine dependence without complication [id number]   PCP:  Pcp, No Pharmacy:   [facility], [place] - [address] [place] [address] Phone: [phone] Fax: [phone]     Social Determinants of Health (SDOH) Social History: SDOH Screenings   Food Insecurity: No Food Insecurity (02/10/2023)  Housing: Low Risk  (02/10/2023)  Transportation Needs: No Transportation Needs (02/10/2023)  Utilities: Not At Risk (02/10/2023)  Tobacco Use: High Risk (02/09/2023)   SDOH Interventions:     Readmission Risk Interventions     No data to display

## 2023-02-10 NOTE — H&P (Addendum)
History and Physical  Tom Anderson [id number] DOB: 04/05/1971 DOA: 02/09/2023   PCP: Pcp, No   Patient coming from: Home   Chief Complaint: DKA   HPI: Tom Anderson is a 52 y.o. male with medical history significant for schizophrenia, type 1 diabetes and DKA history presented to the emergency department concerned about having DKA.  He is a diabetic and lost his blood glucose meter about a week ago, otherwise tries to be compliant with his medications.  He takes Levemir at night, and NovoLog sliding scale with meals.  Says that for the last couple days, he has been having some nausea and vomiting, polyuria, polydipsia has been getting more short of breath.  Denies any cough, other than a slight nonproductive chronic cough.  ED Course: Emergency department, he was described as being tachycardic and in some acute distress.  Venous blood gas showed pH 7.02, labs showed AKI with creatinine 1.97, blood sugar 1017, AST ALT slightly elevated, mild leukocytosis 15,000.  The patient was given several IV fluid boluses, started on IV insulin drip, and transferred to [facility] for admission to the hospitalist service.  The patient is resting comfortably and asymptomatic, while being worked up in the ED, he was noted to be COVID-positive.  Review of Systems: Please see HPI for pertinent positives and negatives. A complete 10 system review of systems are otherwise negative.  Past Medical History:  Diagnosis Date   Asthma    Depression    Diabetes mellitus without complication ([place])    Schizophrenia ([place])    Past Surgical History:  Procedure Laterality Date   HERNIA REPAIR      Social History:  reports that he has been smoking cigarettes. He has been smoking an average of 2.50 packs per day. He has never used smokeless tobacco. He reports current alcohol use. He reports current drug use. Drug: Marijuana.   Allergies  Allergen Reactions   Nitrous Oxide Nausea And Vomiting    Family History   Problem Relation Age of Onset   Diabetes Maternal Grandfather      Prior to Admission medications   Medication Sig Start Date End Date Taking? Authorizing Provider  Calcium Carbonate Antacid (TUMS PO) Take 1 tablet by mouth daily as needed (heartburn).    [provider]  feeding supplement, GLUCERNA SHAKE, (GLUCERNA SHAKE) LIQD Take 237 mLs by mouth 2 (two) times daily between meals. 01/15/23   Allie Bossier, MD  folic acid (FOLVITE) 1 MG tablet Take 1 tablet (1 mg total) by mouth daily. 01/15/23   Allie Bossier, MD  insulin aspart (NOVOLOG) 100 UNIT/ML FlexPen For glucose 121 to 150 use 1 unit, for 151 to 200 use 2 units, for 201 to 250 use 3 units, for 251 to 300 use 5 units, for 301 to 350 use 7 units for 351 or greater use 9 units. Patient taking differently: Inject 1-18 Units into the skin in the morning, at noon, in the evening, and at bedtime. 10/07/20   [name], Jimmy Picket, MD  insulin detemir (LEVEMIR) 100 UNIT/ML FlexPen Inject 15 Units into the skin daily. Patient taking differently: Inject 8 Units into the skin at bedtime. 10/07/20   [name], Jimmy Picket, MD  Multiple Vitamin (MULTIVITAMIN WITH MINERALS) TABS tablet Take 1 tablet by mouth daily. 01/15/23   Allie Bossier, MD  ondansetron (ZOFRAN) 4 MG tablet Take 1 tablet (4 mg total) by mouth every 6 (six) hours as needed for nausea or vomiting. Patient not taking: Reported on  01/13/2023 12/17/22   Azucena Cecil, PA-C  potassium chloride (KLOR-CON M) 10 MEQ tablet Take 5 tablets (50 mEq total) by mouth once for 1 dose. 01/15/23 01/15/23  Allie Bossier, MD  thiamine (VITAMIN B-1) 100 MG tablet Take 1 tablet (100 mg total) by mouth daily. 01/15/23   Allie Bossier, MD  VENTOLIN HFA 108 [phone] Base) MCG/ACT inhaler Inhale 2 puffs into the lungs as needed for shortness of breath or wheezing. 11/20/22   [provider]    Physical Exam: BP 136/84 (BP Location: Right Arm)   Pulse 95   Temp 98.4 F (36.9  C) (Oral)   Resp 13   Ht '5\' 9"'$  (1.753 m)   Wt 63.5 kg   SpO2 97%   BMI 20.67 kg/m   General:  Alert, oriented, calm, in no acute distress  Eyes: EOMI, clear conjuctivae, white sclerea Neck: supple, no masses, trachea mildline  Cardiovascular: RRR, no murmurs or rubs, no peripheral edema  Respiratory: clear to auscultation bilaterally, no wheezes, no crackles  Abdomen: soft, nontender, nondistended, normal bowel tones heard  Skin: dry, no rashes  Musculoskeletal: no joint effusions, normal range of motion  Psychiatric: appropriate affect, normal speech  Neurologic: extraocular muscles intact, clear speech, moving all extremities with intact sensorium          Labs on Admission:  Basic Metabolic Panel: Recent Labs  Lab 02/09/23 1411 02/09/23 1444 02/09/23 1946 02/09/23 2320 02/10/23 0302 02/10/23 0632  NA 123* 121* 131* 131* 128* 127*  K 5.1 5.2* 3.3* 3.8 3.8 3.6  CL 79*  --  95* 95* 94* 99  CO2 7*  --  9* 17* 24 24  GLUCOSE 1,017*  --  556* 325* 166* 143*  BUN 28*  --  24* 23* 22* 20  CREATININE 1.97*  --  1.57* 1.35* 0.90 0.73  CALCIUM 8.3*  --  7.7* 7.9* 8.2* 8.0*  MG  --   --   --   --  1.9  --   PHOS  --   --   --   --  2.3*  --    Liver Function Tests: Recent Labs  Lab 02/09/23 1411  AST 74*  ALT 78*  ALKPHOS 229*  BILITOT 1.7*  PROT 8.0  ALBUMIN 4.4   Recent Labs  Lab 02/09/23 1411  LIPASE 30   No results for input(s): "AMMONIA" in the last 168 hours. CBC: Recent Labs  Lab 02/09/23 1411 02/09/23 1444 02/10/23 0302  WBC 14.0*  --  17.0*  NEUTROABS 11.4*  --  12.5*  HGB 14.7 17.0 11.5*  HCT 46.1 50.0 34.0*  MCV 104.1*  --  96.9  PLT 360  --  273   Cardiac Enzymes: No results for input(s): "CKTOTAL", "CKMB", "CKMBINDEX", "TROPONINI" in the last 168 hours.  BNP (last 3 results) No results for input(s): "BNP" in the last 8760 hours.  ProBNP (last 3 results) No results for input(s): "PROBNP" in the last 8760 hours.  CBG: Recent Labs   Lab 02/10/23 0323 02/10/23 0432 02/10/23 0533 02/10/23 0634 02/10/23 0832  GLUCAP 191* 142* 160* 153* 128*    Radiological Exams on Admission: CT ABDOMEN PELVIS WO CONTRAST  Result Date: 02/09/2023 CLINICAL DATA:  Abdominal pain, vomiting, DKA EXAM: CT ABDOMEN AND PELVIS WITHOUT CONTRAST TECHNIQUE: Multidetector CT imaging of the abdomen and pelvis was performed following the standard protocol without IV contrast. RADIATION DOSE REDUCTION: This exam was performed according to the departmental dose-optimization program which includes  automated exposure control, adjustment of the mA and/or kV according to patient size and/or use of iterative reconstruction technique. COMPARISON:  Multiple priors including most recent CT December 17, 2022. FINDINGS: Despite efforts by the technologist and patient, marked motion artifact is present on today's exam and could not be eliminated. This reduces exam sensitivity and specificity. Lower chest: No acute abnormality. Hepatobiliary: Unremarkable noncontrast enhanced appearance of the hepatic parenchyma. No evidence of acute cholecystitis or biliary ductal dilation. Pancreas: No pancreatic ductal dilation or evidence of acute inflammation. Spleen: No splenomegaly. Adrenals/Urinary Tract: Bilateral adrenal glands appear normal. No hydronephrosis. No renal, ureteral or bladder calculi. Urinary bladder is distended extending above the sacral promontory. Stomach/Bowel: Stomach is distended with ingested material and gas. No pathologic dilation of small or large bowel. No evidence of acute bowel inflammation. Vascular/Lymphatic: Aortic atherosclerosis. Smooth IVC contours. No pathologically enlarged abdominal or pelvic lymph nodes. Reproductive: Prostate glands unremarkable. High-riding testes located at the inguinal canal. Other: No significant abdominal free fluid. Musculoskeletal: No acute osseous abnormality. Chronic T11 anterior wedge compression deformity. Loss of  sphericity of the femoral heads with cystic change along the femoral head neck junction. IMPRESSION: Marked motion artifact is present on today's exam and could not be eliminated. This reduces exam sensitivity and specificity. Within this context: 1. No acute abnormality identified in the abdomen or pelvis. 2. Urinary bladder is distended extending above the sacral promontory. Correlate for urinary retention. 3. High-riding testes located at the inguinal canal. 4. Loss of sphericity of the femoral heads with cystic change along the femoral head neck junction, which can be seen in the setting of femoral acetabular impingement. 5.  Aortic Atherosclerosis (ICD10-I70.0). Electronically Signed   By: Dahlia Bailiff M.D.   On: 02/09/2023 17:55   DG Chest Portable 1 View  Result Date: 02/09/2023 CLINICAL DATA:  Shortness of breath, abdominal pain EXAM: PORTABLE CHEST 1 VIEW COMPARISON:  12/17/2022 FINDINGS: Two frontal views of the chest demonstrate an unremarkable cardiac silhouette. No airspace disease, effusion, or pneumothorax. Chronic healing left posterior seventh rib fracture. IMPRESSION: 1. No acute intrathoracic process. Electronically Signed   By: Randa Ngo M.D.   On: 02/09/2023 14:59    Assessment/Plan Principal Problem:   DKA (diabetic ketoacidosis) ([place]) -admitted with several days of nausea vomiting, dehydration, severely elevated hyperglycemia, with anion gap.  He was treated with IV fluid boluses, insulin drip.  His gap is now closed x 2.  He will be transitioned to a diabetic diet, with sensitive sliding scale insulin.  Seems that the cause of his initial DKA was due to medication noncompliance as he lost his glucometer. -Inpatient admission -Continue gentle IV fluids -Give Levemir, discontinue insulin drip 2 hours thereafter -Initiate NovoLog sensitive sliding scale Active Problems:   T2DM (type 2 diabetes mellitus) ([facility])   Schizophrenia ([facility])   Major depression   Essential tremor    Hyponatremia COVID - incidental finding of COVID, not symptomatic and no specific treatment indicated  DVT prophylaxis: Lovenox  Code Status: Full Code   Admission status: Inpatient   Time spent: 42 minutes  Ezekiel Menzer Neva Seat MD [facility] Pager [phone]  If 7PM-7AM, please contact night-coverage [email or web address] Password [email or web address]  02/10/2023, 9:22 AM

## 2023-02-10 NOTE — Progress Notes (Signed)
(  Carryover admission to the Day Admitter; accepted by Dr. Jani Gravel as transfer from  [facility]  to a   sdu bed at  [facility]  for  dka.  Case was discussed with PCCM regarding potential admission to ICU given low pH, but after the review, they recommended TRH admission to stepdown unit).   I have placed some additional preliminary admit orders via the adult multi-morbid admission order set. I have also ordered continuation of existing insulin drip via Endo tool protocol, every 4 hours BMPs ordered through 11 AM on 02/10/2023.  Continuation of existing IV fluid orders.  Also ordered as needed IV Zofran, as needed IV Dilaudid.  Every hour CBG monitoring.  I also ordered morning labs in the form of CBC, as well as serum magnesium and phosphorus levels.    Babs Bertin, DO Hospitalist

## 2023-02-10 NOTE — Inpatient Diabetes Management (Addendum)
Inpatient Diabetes Program Recommendations  AACE/ADA: New Consensus Statement on Inpatient Glycemic Control (2015)  Target Ranges:  Prepandial:   less than 140 mg/dL      Peak postprandial:   less than 180 mg/dL (1-2 hours)      Critically ill patients:  140 - 180 mg/dL    Latest Reference Range & Units 01/12/23 16:11  Hemoglobin A1C 4.8 - 5.6 % 11.8 (H)  291 mg/dl  (H): Data is abnormally high  Latest Reference Range & Units 02/09/23 14:11  Sodium 135 - 145 mmol/L 123 (L)  Potassium 3.5 - 5.1 mmol/L 5.1  Chloride 98 - 111 mmol/L 79 (L)  CO2 22 - 32 mmol/L 7 (L)  Glucose 70 - 99 mg/dL 1,017 (HH)  BUN 6 - 20 mg/dL 28 (H)  Creatinine 0.61 - 1.24 mg/dL 1.97 (H)  Calcium 8.9 - 10.3 mg/dL 8.3 (L)  Anion gap 5 - 15  37 (H)  (HH): Data is critically high (L): Data is abnormally low (H): Data is abnormally high  Latest Reference Range & Units 02/09/23 13:50 02/09/23 16:59 02/09/23 17:45 02/09/23 18:18 02/09/23 18:56 02/09/23 19:31 02/09/23 19:58 02/09/23 20:32 02/09/23 21:13  Glucose-Capillary 70 - 99 mg/dL >600 (HH) >600 (HH)  IV Insulin Drip Started >600 (HH) >600 (HH) >600 (HH) 595 (HH) 526 (HH) 428 (H) 360 (H)  (HH): Data is critically high (H): Data is abnormally high  Latest Reference Range & Units 02/09/23 22:40 02/09/23 23:34 02/10/23 00:33 02/10/23 01:37 02/10/23 02:16 02/10/23 03:23 02/10/23 04:32 02/10/23 05:33 02/10/23 06:34  Glucose-Capillary 70 - 99 mg/dL 330 (H) 307 (H) 219 (H) 215 (H) 194 (H) 191 (H) 142 (H) 160 (H) 153 (H)  IV Insulin Drip Infusing  (H): Data is abnormally high    Admit with:  DKA--States he lost his blood glucose meter a week ago He has not been fully compliant with his medications   History: Type 1 Diabetes, Schizophrenia  Home DM Meds: Novolog 1-18 units QID        Levemir 8 units QHS  Current Orders: IV Insulin Drip   MD- Note 6:30am BMET shows Anion Gap 4 and CO2 level 24.  When you allow pt to transition to SQ Insulin, please  consider:  1. Start Levemir 8 units Daily (make sure to continue the IV Insulin drip for 2 hours after Levemir on board and then can d/c the IV Insulin Drip)  2. Start Novolog Sensitive Correction Scale/ SSI (0-9 units) TID AC + HS  3. If pt allowed to eat, Start Novolog 4 units TID with meals for meal coverage    Addendum 12pm--Met w/ pt at bedside.  Pt was sleepy but able to wake to my voice and answer my questions.  Pt told me he has his insulin at home--pt verified that he takes Levemir 8 units QHS + Novolog 1 unit for every 15 grams Carbs.  I asked pt if he received the transmitter for his Dexcom G6 after his last hospital stay and pt told me he did get the transmitter but now has misplaced/lost the CGM reader/receiver that shows him his glucose levels.  Pt stated to me "don't worry, I'll find it when I go home".  Reviewed with pt his current glucose levels and labs and how we are transitioning him off the IV Insulin drip to his SQ Insulin regimen.  Pt gave me permission to call his Mom, [name] with an update.  Reviewed all of the above info with pt's Mom.  Asked Mom to please look for pt's reader/receiver for his Dexcom.  Mom confirmed that pt did get the replacement transmitter after he went home last admission.  Mom told me pt is very forgetful and they have to remind him frequently to take his insulin and check his Dexcom CGM.  Mom told me pt will lose his reader/receiver if he goes and stays with friends.  I told Mom that if they can't find his reader/receiver that they can call the [facility] and see if the Dex[employer] rep will send another.  Mom told me they plan to make pt a follow up appt with ENDO Dr. Tamala Julian soon after d/c from this hospital stay.  Per Mom, when everything is stable with pt's diabetes, pt sees ENDO every 3 months.  Mom very appreciative of call and did not have any further diabetes questions.     Diabetes Coordinator RN counseled pt on 01/13/2023 (previous admit)  and pt reported grandmother recently passed and his home situation had gone downhill since; he said he had lost transmitter for Dexcom and does not have cell phone so not able to use G7. DM Coordinator called [employer] and got them to send pt a new transmitter.   ENDO: Dr. Tamala Julian with [facility] seen 12/28/2022 Per MD notes:  Restart DEXCOM Continue Levemir at 8 units daily Continue Novolog with meals- 1:15 carbs     --Will follow patient during hospitalization--  [name] RN, MSN, [place] Diabetes Coordinator Inpatient Glycemic Control Team Team Pager: [phone] (8a-5p)

## 2023-02-11 DIAGNOSIS — E1065 Type 1 diabetes mellitus with hyperglycemia: Secondary | ICD-10-CM | POA: Diagnosis not present

## 2023-02-11 DIAGNOSIS — R319 Hematuria, unspecified: Secondary | ICD-10-CM | POA: Diagnosis not present

## 2023-02-11 DIAGNOSIS — E871 Hypo-osmolality and hyponatremia: Secondary | ICD-10-CM | POA: Diagnosis not present

## 2023-02-11 LAB — COMPREHENSIVE METABOLIC PANEL
ALT: 43 U/L (ref 0–44)
AST: 41 U/L (ref 15–41)
Albumin: 3.3 g/dL — ABNORMAL LOW (ref 3.5–5.0)
Alkaline Phosphatase: 140 U/L — ABNORMAL HIGH (ref 38–126)
Anion gap: 10 (ref 5–15)
BUN: 10 mg/dL (ref 6–20)
CO2: 26 mmol/L (ref 22–32)
Calcium: 8 mg/dL — ABNORMAL LOW (ref 8.9–10.3)
Chloride: 89 mmol/L — ABNORMAL LOW (ref 98–111)
Creatinine, Ser: 0.72 mg/dL (ref 0.61–1.24)
GFR, Estimated: >60 mL/min (ref >60)
Glucose, Bld: 340 mg/dL — ABNORMAL HIGH (ref 70–99)
Potassium: 3.9 mmol/L (ref 3.5–5.1)
Sodium: 125 mmol/L — ABNORMAL LOW (ref 135–145)
Total Bilirubin: 1.1 mg/dL (ref 0.3–1.2)
Total Protein: 5.6 g/dL — ABNORMAL LOW (ref 6.5–8.1)

## 2023-02-11 LAB — GLUCOSE, CAPILLARY
Glucose-Capillary: 202 mg/dL — ABNORMAL HIGH (ref 70–99)
Glucose-Capillary: 346 mg/dL — ABNORMAL HIGH (ref 70–99)
Glucose-Capillary: 197 mg/dL — ABNORMAL HIGH (ref 70–99)
Glucose-Capillary: 74 mg/dL (ref 70–99)
Glucose-Capillary: 170 mg/dL — ABNORMAL HIGH (ref 70–99)

## 2023-02-11 LAB — CBC
HCT: 34.5 % — ABNORMAL LOW (ref 39.0–52.0)
Hemoglobin: 11.6 g/dL — ABNORMAL LOW (ref 13.0–17.0)
MCH: 33 pg (ref 26.0–34.0)
MCHC: 33.6 g/dL (ref 30.0–36.0)
MCV: 98 fL (ref 80.0–100.0)
Platelets: 195 10*3/uL (ref 150–400)
RBC: 3.52 MIL/uL — ABNORMAL LOW (ref 4.22–5.81)
RDW: 11.9 % (ref 11.5–15.5)
WBC: 9.4 10*3/uL (ref 4.0–10.5)
nRBC: 0 % (ref 0.0–0.2)

## 2023-02-11 LAB — MAGNESIUM: Magnesium: 2.1 mg/dL (ref 1.7–2.4)

## 2023-02-11 MED ORDER — TAMSULOSIN HCL 0.4 MG PO CAPS
0.4000 mg | ORAL_CAPSULE | Freq: Every day | ORAL | Status: DC
  Administered 2023-02-11 – 2023-02-12 (×2): 0.4 mg via ORAL
  Filled 2023-02-11 (×2): qty 1

## 2023-02-11 MED ORDER — ALUM & MAG HYDROXIDE-SIMETH 200-200-20 MG/5ML PO SUSP: 30.0000 mL | Freq: Once | ORAL | Status: DC

## 2023-02-11 MED ORDER — PANTOPRAZOLE SODIUM 40 MG PO TBEC: 40.0000 mg | DELAYED_RELEASE_TABLET | Freq: Every day | ORAL | Status: DC

## 2023-02-11 MED ORDER — ALUM & MAG HYDROXIDE-SIMETH 200-200-20 MG/5ML PO SUSP: 30.0000 mL | ORAL | Status: DC | PRN

## 2023-02-11 MED ORDER — INSULIN DETEMIR 100 UNIT/ML ~~LOC~~ SOLN
12.0000 [IU] | Freq: Every day | SUBCUTANEOUS | Status: DC
  Administered 2023-02-11 – 2023-02-12 (×2): 12 [IU] via SUBCUTANEOUS
  Filled 2023-02-11 (×2): qty 0.12

## 2023-02-11 NOTE — Plan of Care (Signed)
Discussed with patient plan of care for the evening, pain management and medications with some teach back displayed.  Patient given second urinal.  Problem: Education: Goal: Ability to describe self-care measures that may prevent or decrease complications (Diabetes Survival Skills Education) will improve Outcome: Progressing   Problem: Education: Goal: Individualized Educational Video(s) Outcome: Progressing   Problem: Health Behavior/Discharge Planning: Goal: Ability to identify and utilize available resources and services will improve Outcome: Not Progressing

## 2023-02-11 NOTE — Progress Notes (Signed)
[facility] PROGRESS NOTE   Tom Anderson [id number] DOB: [birth year] DOA: 02/09/2023  PCP: Pcp, No  Brief History/Interval Summary: 52 y.o. male with medical history significant for schizophrenia, type 1 diabetes and DKA history presented to the emergency department concerned about having DKA.  He is a diabetic and lost his blood glucose meter about a week ago, otherwise tries to be compliant with his medications.  He takes Levemir at night, and NovoLog sliding scale with meals.  Says that for the last couple days, he has been having some nausea and vomiting, polyuria, polydipsia has been getting more short of breath.  Denies any cough, other than a slight nonproductive chronic cough.  He was noted to be positive for COVID-19.  Found to be in DKA.  He was hospitalized for further management.  Consultants: None  Procedures: None    Subjective/Interval History: Patient feels much better this morning.  Has a chronic cough because he smokes cigarettes.  Denies any nausea vomiting.      Assessment/Plan:  Diabetic ketoacidosis in the setting of a known history of type 1 diabetes Likely triggered by COVID-19.  There could be an element of noncompliance as well.  Patient apparently lost his glucometer recently.  He had elevated anion gap.  He was placed on IV insulin.  His anion gap closed and he was transitioned to basal insulin.  Diabetes coordinator is following. HbA1c was 11.8 in February. CBGs are elevated.  Will increase the dose of his glargine.  Urinary retention/hematuria Foley catheter was placed for retention in the emergency department.  A voiding trial was done this morning however after his Foley catheter was pulled out he was noted to have hematuria.  Discussed with Dr. Junious Silk who recommended monitoring for couple hours as long as he is able to urinate.  He is not on any anticoagulants.  Flomax will be ordered. If bleeding does not subside we will again discuss with  urology. Check CBC.  COVID-19 Seems to be incidental finding.  He has a cough which is chronic.  He denies any change in his cough pattern.  No shortness of breath.  Chest x-ray did not show any acute findings.   However he is noted to be on oxygen 2 L/min.  Try to wean down. No need for any COVID-19 specific treatments at this time.  Hyponatremia Patient has chronic hyponatremia based on previous labs.  Asymptomatic.  Monitor daily.  Acute kidney injury Resolved with IV fluids.  Normocytic anemia Drop in hemoglobin was dilutional.  In view of hematuria will recheck CBC today.  Elevated blood pressure Not noted to be on any antihypertensives at home.  Monitor blood pressures for now.  May need to use antihypertensives if they remain elevated.  DVT Prophylaxis: Lovenox will be held due to hematuria.  SCDs Code Status: Full code Family Communication: Discussed with the patient Disposition Plan: Hopefully return home when improved  Status is: Inpatient Remains inpatient appropriate because: DKA      Medications: Scheduled:  Chlorhexidine Gluconate Cloth  6 each Topical Daily   enoxaparin (LOVENOX) injection  40 mg Subcutaneous Q24H   feeding supplement (GLUCERNA SHAKE)  237 mL Oral TID BM   insulin aspart  0-5 Units Subcutaneous QHS   insulin aspart  0-9 Units Subcutaneous TID WC   insulin aspart  4 Units Subcutaneous TID WC   insulin detemir  8 Units Subcutaneous QHS   tamsulosin  0.4 mg Oral Daily   Continuous:  dextrose 5% lactated  ringers Stopped (02/10/23 1159)   lactated ringers 50 mL/hr at 02/11/23 0544   [id number] **OR** acetaminophen, dextrose, HYDROmorphone (DILAUDID) injection, naLOXone (NARCAN)  injection, ondansetron (ZOFRAN) IV, mouth rinse  Antibiotics: Anti-infectives (From admission, onward)    None       Objective:  Vital Signs  Vitals:   02/11/23 0500 02/11/23 0600 02/11/23 0700 02/11/23 0800  BP: (!) 154/106 135/88 (!) 170/115 (!)  173/141  Pulse: (!) 102 (!) 53 (!) 49 (!) 172  Resp: 18 (!) 23 (!) 27 17  Temp:    98 F (36.7 C)  TempSrc:    Oral  SpO2: 94% 92% 93% 90%  Weight:      Height:        Intake/Output Summary (Last 24 hours) at 02/11/2023 1004 Last data filed at 02/11/2023 0544 Gross per 24 hour  Intake 1581.72 ml  Output 2200 ml  Net -618.28 ml   Filed Weights   02/09/23 1346  Weight: 63.5 kg    General appearance: Awake alert.  In no distress Resp: Clear to auscultation bilaterally.  Normal effort Cardio: S1-S2 is normal regular.  No S3-S4.  No rubs murmurs or bruit GI: Abdomen is soft.  Nontender nondistended.  Bowel sounds are present normal.  No masses organomegaly Extremities: No edema.  Full range of motion of lower extremities. Neurologic: Alert and oriented x3.  No focal neurological deficits.    Lab Results:  Data Reviewed: I have personally reviewed following labs and reports of the imaging studies  CBC: Recent Labs  Lab 02/09/23 1411 02/09/23 1444 02/10/23 0302  WBC 14.0*  --  17.0*  NEUTROABS 11.4*  --  12.5*  HGB 14.7 17.0 11.5*  HCT 46.1 50.0 34.0*  MCV 104.1*  --  96.9  PLT 360  --  123456    Basic Metabolic Panel: Recent Labs  Lab 02/09/23 1946 02/09/23 2320 02/10/23 0302 02/10/23 0632 02/11/23 0819  NA 131* 131* 128* 127* 125*  K 3.3* 3.8 3.8 3.6 3.9  CL 95* 95* 94* 99 89*  CO2 9* 17* '24 24 26  '$ GLUCOSE 556* 325* 166* 143* 340*  BUN 24* 23* 22* 20 10  CREATININE 1.57* 1.35* 0.90 0.73 0.72  CALCIUM 7.7* 7.9* 8.2* 8.0* 8.0*  MG  --   --  1.9  --  2.1  PHOS  --   --  2.3*  --   --     GFR: Estimated Creatinine Clearance: 100.3 mL/min (by C-G formula based on SCr of 0.72 mg/dL).  Liver Function Tests: Recent Labs  Lab 02/09/23 1411 02/11/23 0819  AST 74* 41  ALT 78* 43  ALKPHOS 229* 140*  BILITOT 1.7* 1.1  PROT 8.0 5.6*  ALBUMIN 4.4 3.3*    Recent Labs  Lab 02/09/23 1411  LIPASE 30     CBG: Recent Labs  Lab 02/10/23 1202  02/10/23 1729 02/10/23 2123 02/11/23 0426 02/11/23 0804  GLUCAP 197* 193* 103* 202* 346*     Recent Results (from the past 240 hour(s))  Resp panel by RT-PCR (RSV, Flu A&B, Covid) Anterior Nasal Swab     Status: Abnormal   Collection Time: 02/09/23  4:03 PM   Specimen: Anterior Nasal Swab  Result Value Ref Range Status   SARS Coronavirus 2 by RT PCR POSITIVE (A) NEGATIVE Final    Comment: (NOTE) SARS-CoV-2 target nucleic acids are DETECTED.  The SARS-CoV-2 RNA is generally detectable in upper respiratory specimens during the acute phase of infection. Positive results are indicative  of the presence of the identified virus, but do not rule out bacterial infection or co-infection with other pathogens not detected by the test. Clinical correlation with patient history and other diagnostic information is necessary to determine patient infection status. The expected result is Negative.  Fact Sheet for Patients: EntrepreneurPulse.com.au  Fact Sheet for Healthcare Providers: IncredibleEmployment.be  This test is not yet approved or cleared by the [place] FDA and  has been authorized for detection and/or diagnosis of SARS-CoV-2 by FDA under an Emergency Use Authorization (EUA).  This EUA will remain in effect (meaning this test can be used) for the duration of  the COVID-19 declaration under Section 564(b)(1) of the A ct, 21 U.S.C. section 360bbb-3(b)(1), unless the authorization is terminated or revoked sooner.     Influenza A by PCR NEGATIVE NEGATIVE Final   Influenza B by PCR NEGATIVE NEGATIVE Final    Comment: (NOTE) The Xpert Xpress SARS-CoV-2/FLU/RSV plus assay is intended as an aid in the diagnosis of influenza from Nasopharyngeal swab specimens and should not be used as a sole basis for treatment. Nasal washings and aspirates are unacceptable for Xpert Xpress SARS-CoV-2/FLU/RSV testing.  Fact Sheet for  Patients: EntrepreneurPulse.com.au  Fact Sheet for Healthcare Providers: IncredibleEmployment.be  This test is not yet approved or cleared by the [place] FDA and has been authorized for detection and/or diagnosis of SARS-CoV-2 by FDA under an Emergency Use Authorization (EUA). This EUA will remain in effect (meaning this test can be used) for the duration of the COVID-19 declaration under Section 564(b)(1) of the Act, 21 U.S.C. section 360bbb-3(b)(1), unless the authorization is terminated or revoked.     Resp Syncytial Virus by PCR NEGATIVE NEGATIVE Final    Comment: (NOTE) Fact Sheet for Patients: EntrepreneurPulse.com.au  Fact Sheet for Healthcare Providers: IncredibleEmployment.be  This test is not yet approved or cleared by the [place] FDA and has been authorized for detection and/or diagnosis of SARS-CoV-2 by FDA under an Emergency Use Authorization (EUA). This EUA will remain in effect (meaning this test can be used) for the duration of the COVID-19 declaration under Section 564(b)(1) of the Act, 21 U.S.C. section 360bbb-3(b)(1), unless the authorization is terminated or revoked.  Performed at [facility], [address] [place], [place] [address]   MRSA Next Gen by PCR, Nasal     Status: None   Collection Time: 02/10/23  2:26 AM   Specimen: Nasal Mucosa; Nasal Swab  Result Value Ref Range Status   MRSA by PCR Next Gen NOT DETECTED NOT DETECTED Final    Comment: (NOTE) The GeneXpert MRSA Assay (FDA approved for NASAL specimens only), is one component of a comprehensive MRSA colonization surveillance program. It is not intended to diagnose MRSA infection nor to guide or monitor treatment for MRSA infections. Test performance is not FDA approved in patients less than 12 years old. Performed at [facility], [place] [address], [place], [place] [address]        Radiology Studies: CT ABDOMEN PELVIS WO CONTRAST  Result Date: 02/09/2023 CLINICAL DATA:  Abdominal pain, vomiting, DKA EXAM: CT ABDOMEN AND PELVIS WITHOUT CONTRAST TECHNIQUE: Multidetector CT imaging of the abdomen and pelvis was performed following the standard protocol without IV contrast. RADIATION DOSE REDUCTION: This exam was performed according to the departmental dose-optimization program which includes automated exposure control, adjustment of the mA and/or kV according to patient size and/or use of iterative reconstruction technique. COMPARISON:  Multiple priors including most recent CT December 17, 2022. FINDINGS: Despite efforts by the technologist and patient, marked motion artifact is present on today's exam and could not be eliminated. This reduces exam sensitivity and specificity. Lower chest: No acute abnormality. Hepatobiliary: Unremarkable noncontrast enhanced appearance of the hepatic parenchyma. No evidence of acute cholecystitis or biliary ductal dilation. Pancreas: No pancreatic ductal dilation or evidence of acute inflammation. Spleen: No splenomegaly. Adrenals/Urinary Tract: Bilateral adrenal glands appear normal. No hydronephrosis. No renal, ureteral or bladder calculi. Urinary bladder is distended extending above the sacral promontory. Stomach/Bowel: Stomach is distended with ingested material and gas. No pathologic dilation of small or large bowel. No evidence of acute bowel inflammation. Vascular/Lymphatic: Aortic atherosclerosis. Smooth IVC contours. No pathologically enlarged abdominal or pelvic lymph nodes. Reproductive: Prostate glands unremarkable. High-riding testes located at the inguinal canal. Other: No significant abdominal free fluid. Musculoskeletal: No acute osseous abnormality. Chronic T11 anterior wedge compression deformity. Loss of sphericity of the femoral heads with cystic change along the femoral head neck junction. IMPRESSION: Marked motion artifact is  present on today's exam and could not be eliminated. This reduces exam sensitivity and specificity. Within this context: 1. No acute abnormality identified in the abdomen or pelvis. 2. Urinary bladder is distended extending above the sacral promontory. Correlate for urinary retention. 3. High-riding testes located at the inguinal canal. 4. Loss of sphericity of the femoral heads with cystic change along the femoral head neck junction, which can be seen in the setting of femoral acetabular impingement. 5.  Aortic Atherosclerosis (ICD10-I70.0). Electronically Signed   By: Dahlia Bailiff M.D.   On: 02/09/2023 17:55   DG Chest Portable 1 View  Result Date: 02/09/2023 CLINICAL DATA:  Shortness of breath, abdominal pain EXAM: PORTABLE CHEST 1 VIEW COMPARISON:  12/17/2022 FINDINGS: Two frontal views of the chest demonstrate an unremarkable cardiac silhouette. No airspace disease, effusion, or pneumothorax. Chronic healing left posterior seventh rib fracture. IMPRESSION: 1. No acute intrathoracic process. Electronically Signed   By: Randa Ngo M.D.   On: 02/09/2023 14:59       LOS: 1 day   [facility] Pager on [email or web address]  02/11/2023, 10:04 AM

## 2023-02-11 NOTE — Inpatient Diabetes Management (Signed)
Inpatient Diabetes Program Recommendations  AACE/ADA: New Consensus Statement on Inpatient Glycemic Control (2015)  Target Ranges:  Prepandial:   less than 140 mg/dL      Peak postprandial:   less than 180 mg/dL (1-2 hours)      Critically ill patients:  140 - 180 mg/dL   Lab Results  Component Value Date   GLUCAP 346 (H) 02/11/2023   HGBA1C 11.8 (H) 01/12/2023    Review of Glycemic Control  Diabetes history: DM1  Current orders for Inpatient glycemic control:   Inpatient Diabetes Program Recommendations:    Consider increasing Semglee to 11 units QD  Continue to follow.  Thank you. Lorenda Peck, RD, LDN, [place] Inpatient Diabetes Coordinator [phone]

## 2023-02-12 DIAGNOSIS — E1065 Type 1 diabetes mellitus with hyperglycemia: Secondary | ICD-10-CM | POA: Diagnosis not present

## 2023-02-12 LAB — CBC
HCT: 36.4 % — ABNORMAL LOW (ref 39.0–52.0)
Hemoglobin: 12.6 g/dL — ABNORMAL LOW (ref 13.0–17.0)
MCH: 33.7 pg (ref 26.0–34.0)
MCHC: 34.6 g/dL (ref 30.0–36.0)
MCV: 97.3 fL (ref 80.0–100.0)
Platelets: 199 10*3/uL (ref 150–400)
RBC: 3.74 MIL/uL — ABNORMAL LOW (ref 4.22–5.81)
RDW: 11.8 % (ref 11.5–15.5)
WBC: 9 10*3/uL (ref 4.0–10.5)
nRBC: 0 % (ref 0.0–0.2)

## 2023-02-12 LAB — BASIC METABOLIC PANEL
Anion gap: 10 (ref 5–15)
BUN: 9 mg/dL (ref 6–20)
CO2: 29 mmol/L (ref 22–32)
Calcium: 8.8 mg/dL — ABNORMAL LOW (ref 8.9–10.3)
Chloride: 92 mmol/L — ABNORMAL LOW (ref 98–111)
Creatinine, Ser: 0.68 mg/dL (ref 0.61–1.24)
GFR, Estimated: >60 mL/min (ref >60)
Glucose, Bld: 121 mg/dL — ABNORMAL HIGH (ref 70–99)
Potassium: 3.2 mmol/L — ABNORMAL LOW (ref 3.5–5.1)
Sodium: 131 mmol/L — ABNORMAL LOW (ref 135–145)

## 2023-02-12 LAB — GLUCOSE, CAPILLARY: Glucose-Capillary: 255 mg/dL — ABNORMAL HIGH (ref 70–99)

## 2023-02-12 MED ORDER — TAMSULOSIN HCL 0.4 MG PO CAPS: 0.4000 mg | ORAL_CAPSULE | Freq: Every day | ORAL | 0 refills | Status: AC

## 2023-02-12 MED ORDER — POTASSIUM CHLORIDE CRYS ER 20 MEQ PO TBCR
40.0000 meq | EXTENDED_RELEASE_TABLET | ORAL | Status: DC
  Administered 2023-02-12: 40 meq via ORAL
  Filled 2023-02-12: qty 2

## 2023-02-12 MED ORDER — METOCLOPRAMIDE HCL 5 MG PO TABS: 5.0000 mg | ORAL_TABLET | Freq: Three times a day (TID) | ORAL | 0 refills | Status: DC | PRN

## 2023-02-12 MED ORDER — CHLORHEXIDINE GLUCONATE CLOTH 2 % EX PADS: 6.0000 | MEDICATED_PAD | Freq: Every day | CUTANEOUS | Status: DC

## 2023-02-12 MED ORDER — PANTOPRAZOLE SODIUM 40 MG PO TBEC: 40.0000 mg | DELAYED_RELEASE_TABLET | Freq: Every day | ORAL | 0 refills | Status: DC

## 2023-02-12 NOTE — Discharge Summary (Signed)
[facility]  Physician Discharge Summary   Patient ID: Tom Anderson MRN: [id number] DOB/AGE: 1970-12-14 52 y.o.  Admit date: 02/09/2023 Discharge date: 02/12/2023    PCP: Pcp, No  DISCHARGE DIAGNOSES:    DKA (diabetic ketoacidosis) ([place]) Hematuria secondary to Foley   T2DM (type 2 diabetes mellitus) ([place])   Schizophrenia ([place])   Major depression   Essential tremor   Hyponatremia   COVID-19 virus infection   RECOMMENDATIONS FOR OUTPATIENT FOLLOW UP: Patient asked to follow-up with urology if hematuria persists. Patient has to follow-up with his primary care provider as well for further management of diabetes.  Home Health: None Equipment/Devices: None  CODE STATUS: Full code  DISCHARGE CONDITION: fair  Diet recommendation: Carbohydrate modified  INITIAL HISTORY: 52 y.o. male with medical history significant for schizophrenia, type 1 diabetes and DKA history presented to the emergency department concerned about having DKA.  He is a diabetic and lost his blood glucose meter about a week ago, otherwise tries to be compliant with his medications.  He takes Levemir at night, and NovoLog sliding scale with meals.  Says that for the last couple days, he has been having some nausea and vomiting, polyuria, polydipsia has been getting more short of breath.  Denies any cough, other than a slight nonproductive chronic cough.  He was noted to be positive for COVID-19.  Found to be in DKA.  He was hospitalized for further management.    HOSPITAL COURSE:   Diabetic ketoacidosis in the setting of a known history of type 1 diabetes Likely triggered by COVID-19.  There could be an element of noncompliance as well.  Patient apparently lost his glucometer recently.  He had elevated anion gap.  He was placed on IV insulin.  His anion gap closed and he was transitioned to basal insulin.   HbA1c was 11.8 in February.   Urinary retention/hematuria Foley catheter was placed for retention  in the emergency department.  Foley catheter was removed which was followed by onset of hematuria.   Discussed with Dr. Junious Silk who recommended monitoring for couple hours as long as he is able to urinate.  Patient was placed on Flomax.  Hematuria appears to have resolved.  He was told to follow-up with urology if hematuria recurs.  Hemoglobin has been stable.    COVID-19 Seems to be incidental finding.  He has a cough which is chronic.  He denies any change in his cough pattern.  No shortness of breath.  Chest x-ray did not show any acute findings.   Saturating normal on room air at the time of discharge.  He was not given any COVID-19 specific treatments while he was in the hospital. Does have a cough but this is chronic from his tobacco abuse.   Hyponatremia Patient has chronic hyponatremia based on previous labs.  Asymptomatic.    Acute kidney injury Resolved with IV fluids.   Normocytic anemia Drop in hemoglobin was dilutional.  Globin stable despite hematuria.   Elevated blood pressure Resolved   Patient is stable.  Improved.  No further bleeding in his urine.  Okay for discharge home today.   PERTINENT LABS:  The results of significant diagnostics from this hospitalization (including imaging, microbiology, ancillary and laboratory) are listed below for reference.    Microbiology: Recent Results (from the past 240 hour(s))  Resp panel by RT-PCR (RSV, Flu A&B, Covid) Anterior Nasal Swab     Status: Abnormal   Collection Time: 02/09/23  4:03 PM   Specimen: Anterior  Nasal Swab  Result Value Ref Range Status   SARS Coronavirus 2 by RT PCR POSITIVE (A) NEGATIVE Final    Comment: (NOTE) SARS-CoV-2 target nucleic acids are DETECTED.  The SARS-CoV-2 RNA is generally detectable in upper respiratory specimens during the acute phase of infection. Positive results are indicative of the presence of the identified virus, but do not rule out bacterial infection or co-infection with  other pathogens not detected by the test. Clinical correlation with patient history and other diagnostic information is necessary to determine patient infection status. The expected result is Negative.  Fact Sheet for Patients: EntrepreneurPulse.com.au  Fact Sheet for Healthcare Providers: IncredibleEmployment.be  This test is not yet approved or cleared by the [place] FDA and  has been authorized for detection and/or diagnosis of SARS-CoV-2 by FDA under an Emergency Use Authorization (EUA).  This EUA will remain in effect (meaning this test can be used) for the duration of  the COVID-19 declaration under Section 564(b)(1) of the A ct, 21 U.S.C. section 360bbb-3(b)(1), unless the authorization is terminated or revoked sooner.     Influenza A by PCR NEGATIVE NEGATIVE Final   Influenza B by PCR NEGATIVE NEGATIVE Final    Comment: (NOTE) The Xpert Xpress SARS-CoV-2/FLU/RSV plus assay is intended as an aid in the diagnosis of influenza from Nasopharyngeal swab specimens and should not be used as a sole basis for treatment. Nasal washings and aspirates are unacceptable for Xpert Xpress SARS-CoV-2/FLU/RSV testing.  Fact Sheet for Patients: EntrepreneurPulse.com.au  Fact Sheet for Healthcare Providers: IncredibleEmployment.be  This test is not yet approved or cleared by the [place] FDA and has been authorized for detection and/or diagnosis of SARS-CoV-2 by FDA under an Emergency Use Authorization (EUA). This EUA will remain in effect (meaning this test can be used) for the duration of the COVID-19 declaration under Section 564(b)(1) of the Act, 21 U.S.C. section 360bbb-3(b)(1), unless the authorization is terminated or revoked.     Resp Syncytial Virus by PCR NEGATIVE NEGATIVE Final    Comment: (NOTE) Fact Sheet for Patients: EntrepreneurPulse.com.au  Fact Sheet for Healthcare  Providers: IncredibleEmployment.be  This test is not yet approved or cleared by the [place] FDA and has been authorized for detection and/or diagnosis of SARS-CoV-2 by FDA under an Emergency Use Authorization (EUA). This EUA will remain in effect (meaning this test can be used) for the duration of the COVID-19 declaration under Section 564(b)(1) of the Act, 21 U.S.C. section 360bbb-3(b)(1), unless the authorization is terminated or revoked.  Performed at [facility], [address] [place], [place] [address]   MRSA Next Gen by PCR, Nasal     Status: None   Collection Time: 02/10/23  2:26 AM   Specimen: Nasal Mucosa; Nasal Swab  Result Value Ref Range Status   MRSA by PCR Next Gen NOT DETECTED NOT DETECTED Final    Comment: (NOTE) The GeneXpert MRSA Assay (FDA approved for NASAL specimens only), is one component of a comprehensive MRSA colonization surveillance program. It is not intended to diagnose MRSA infection nor to guide or monitor treatment for MRSA infections. Test performance is not FDA approved in patients less than 55 years old. Performed at [facility], [place] [address] [place], [place] [address]      Labs:   Basic Metabolic Panel: Recent Labs  Lab 02/09/23 2320 02/10/23 0302 02/10/23 0632 02/11/23 0819 02/12/23 0347  NA 131* 128* 127* 125* 131*  K 3.8 3.8 3.6 3.9 3.2*  CL 95* 94* 99 89* 92*  CO2 17* 24 24 26 29   GLUCOSE 325* 166* 143* 340* 121*  BUN 23* 22* 20 10 9   CREATININE 1.35* 0.90 0.73 0.72 0.68  CALCIUM 7.9* 8.2* 8.0* 8.0* 8.8*  MG  --  1.9  --  2.1  --   PHOS  --  2.3*  --   --   --    Liver Function Tests: Recent Labs  Lab 02/09/23 1411 02/11/23 0819  AST 74* 41  ALT 78* 43  ALKPHOS 229* 140*  BILITOT 1.7* 1.1  PROT 8.0 5.6*  ALBUMIN 4.4 3.3*   Recent Labs  Lab 02/09/23 1411  LIPASE 30    CBC: Recent Labs  Lab 02/09/23 1411 02/09/23 1444 02/10/23 0302  02/11/23 1137 02/12/23 0347  WBC 14.0*  --  17.0* 9.4 9.0  NEUTROABS 11.4*  --  12.5*  --   --   HGB 14.7 17.0 11.5* 11.6* 12.6*  HCT 46.1 50.0 34.0* 34.5* 36.4*  MCV 104.1*  --  96.9 98.0 97.3  PLT 360  --  273 195 199   Cardiac Enzymes:   CBG: Recent Labs  Lab 02/11/23 0804 02/11/23 1204 02/11/23 1630 02/11/23 2056 02/12/23 0706  GLUCAP 346* 197* 74 170* 255*     IMAGING STUDIES CT ABDOMEN PELVIS WO CONTRAST  Result Date: 02/09/2023 CLINICAL DATA:  Abdominal pain, vomiting, DKA EXAM: CT ABDOMEN AND PELVIS WITHOUT CONTRAST TECHNIQUE: Multidetector CT imaging of the abdomen and pelvis was performed following the standard protocol without IV contrast. RADIATION DOSE REDUCTION: This exam was performed according to the departmental dose-optimization program which includes automated exposure control, adjustment of the mA and/or kV according to patient size and/or use of iterative reconstruction technique. COMPARISON:  Multiple priors including most recent CT December 17, 2022. FINDINGS: Despite efforts by the technologist and patient, marked motion artifact is present on today's exam and could not be eliminated. This reduces exam sensitivity and specificity. Lower chest: No acute abnormality. Hepatobiliary: Unremarkable noncontrast enhanced appearance of the hepatic parenchyma. No evidence of acute cholecystitis or biliary ductal dilation. Pancreas: No pancreatic ductal dilation or evidence of acute inflammation. Spleen: No splenomegaly. Adrenals/Urinary Tract: Bilateral adrenal glands appear normal. No hydronephrosis. No renal, ureteral or bladder calculi. Urinary bladder is distended extending above the sacral promontory. Stomach/Bowel: Stomach is distended with ingested material and gas. No pathologic dilation of small or large bowel. No evidence of acute bowel inflammation. Vascular/Lymphatic: Aortic atherosclerosis. Smooth IVC contours. No pathologically enlarged abdominal or pelvic lymph  nodes. Reproductive: Prostate glands unremarkable. High-riding testes located at the inguinal canal. Other: No significant abdominal free fluid. Musculoskeletal: No acute osseous abnormality. Chronic T11 anterior wedge compression deformity. Loss of sphericity of the femoral heads with cystic change along the femoral head neck junction. IMPRESSION: Marked motion artifact is present on today's exam and could not be eliminated. This reduces exam sensitivity and specificity. Within this context: 1. No acute abnormality identified in the abdomen or pelvis. 2. Urinary bladder is distended extending above the sacral promontory. Correlate for urinary retention. 3. High-riding testes located at the inguinal canal. 4. Loss of sphericity of the femoral heads with cystic change along the femoral head neck junction, which can be seen in the setting of femoral acetabular impingement. 5.  Aortic Atherosclerosis (ICD10-I70.0). Electronically Signed   By: Dahlia Bailiff M.D.   On: 02/09/2023 17:55   DG Chest Portable 1 View  Result Date: 02/09/2023 CLINICAL DATA:  Shortness of breath, abdominal pain  EXAM: PORTABLE CHEST 1 VIEW COMPARISON:  12/17/2022 FINDINGS: Two frontal views of the chest demonstrate an unremarkable cardiac silhouette. No airspace disease, effusion, or pneumothorax. Chronic healing left posterior seventh rib fracture. IMPRESSION: 1. No acute intrathoracic process. Electronically Signed   By: Randa Ngo M.D.   On: 02/09/2023 14:59    DISCHARGE EXAMINATION: Vitals:   02/12/23 0800 02/12/23 0900 02/12/23 1000 02/12/23 1100  BP: 113/83     Pulse: (!) 108 71 99 (!) 103  Resp: (!) 21 (!) 30 (!) 22 (!) 22  Temp:      TempSrc:      SpO2: 93% 98% (!) 85% 96%  Weight:      Height:       General appearance: Awake alert.  In no distress Resp: Clear to auscultation bilaterally.  Normal effort Cardio: S1-S2 is normal regular.  No S3-S4.  No rubs murmurs or bruit GI: Abdomen is soft.  Nontender  nondistended.  Bowel sounds are present normal.  No masses organomegaly    DISPOSITION: Home  Discharge Instructions     Call MD for:  difficulty breathing, headache or visual disturbances   Complete by: As directed    Call MD for:  extreme fatigue   Complete by: As directed    Call MD for:  persistant dizziness or light-headedness   Complete by: As directed    Call MD for:  persistant nausea and vomiting   Complete by: As directed    Call MD for:  severe uncontrolled pain   Complete by: As directed    Call MD for:  temperature >100.4   Complete by: As directed    Diet Carb Modified   Complete by: As directed    Discharge instructions   Complete by: As directed    Take your medications as prescribed.  Please call the urologist office if you have further issues with blood in the urine.  If you experience severe pain or if you are unable to urinate please go to the emergency department immediately.    You were cared for by a hospitalist during your hospital stay. If you have any questions about your discharge medications or the care you received while you were in the hospital after you are discharged, you can call the unit and asked to speak with the hospitalist on call if the hospitalist that took care of you is not available. Once you are discharged, your primary care physician will handle any further medical issues. Please note that NO REFILLS for any discharge medications will be authorized once you are discharged, as it is imperative that you return to your primary care physician (or establish a relationship with a primary care physician if you do not have one) for your aftercare needs so that they can reassess your need for medications and monitor your lab values. If you do not have a primary care physician, you can call [phone] for a physician referral.   Increase activity slowly   Complete by: As directed          Allergies as of 02/12/2023       Reactions   Nitrous Oxide  Nausea And Vomiting        Medication List     STOP taking these medications    ondansetron 4 MG tablet Commonly known as: ZOFRAN       TAKE these medications    feeding supplement (GLUCERNA SHAKE) Liqd Take 237 mLs by mouth 2 (two) times daily between meals.  folic acid 1 MG tablet Commonly known as: FOLVITE Take 1 tablet (1 mg total) by mouth daily.   insulin aspart 100 UNIT/ML FlexPen Commonly known as: NOVOLOG For glucose 121 to 150 use 1 unit, for 151 to 200 use 2 units, for 201 to 250 use 3 units, for 251 to 300 use 5 units, for 301 to 350 use 7 units for 351 or greater use 9 units. What changed:  how much to take how to take this when to take this additional instructions   insulin detemir 100 UNIT/ML FlexPen Commonly known as: LEVEMIR Inject 15 Units into the skin daily. What changed:  how much to take when to take this   metoCLOPramide 5 MG tablet Commonly known as: Reglan Take 1 tablet (5 mg total) by mouth every 8 (eight) hours as needed for nausea.   multivitamin with minerals Tabs tablet Take 1 tablet by mouth daily.   pantoprazole 40 MG tablet Commonly known as: PROTONIX Take 1 tablet (40 mg total) by mouth daily at 12 noon.   potassium chloride 10 MEQ tablet Commonly known as: KLOR-CON M Take 5 tablets (50 mEq total) by mouth once for 1 dose.   tamsulosin 0.4 MG Caps capsule Commonly known as: FLOMAX Take 1 capsule (0.4 mg total) by mouth daily for 7 days.   thiamine 100 MG tablet Commonly known as: Vitamin B-1 Take 1 tablet (100 mg total) by mouth daily.   TUMS PO Take 1 tablet by mouth daily as needed (heartburn).   Ventolin HFA 108 (90 Base) MCG/ACT inhaler Generic drug: albuterol Inhale 2 puffs into the lungs as needed for shortness of breath or wheezing.          Follow-up Information     Festus Aloe, MD Follow up.   Specialty: Urology Why: If symptoms worsen Contact information: [address] [place]  [id number] [phone]                 TOTAL DISCHARGE TIME: 15 minutes  [place]  [facility] Pager on [email or web address]  02/13/2023, 10:30 AM

## 2023-03-19 ENCOUNTER — Emergency Department (HOSPITAL_COMMUNITY): Payer: Medicaid Other

## 2023-03-19 ENCOUNTER — Inpatient Hospital Stay (HOSPITAL_COMMUNITY)
Admission: EM | Admit: 2023-03-19 | Discharge: 2023-03-23 | DRG: 637 | Disposition: A | Discharge status: Home / Self Care | Payer: Medicaid Other | Attending: Internal Medicine | Admitting: Internal Medicine

## 2023-03-19 ENCOUNTER — Inpatient Hospital Stay (HOSPITAL_COMMUNITY): Payer: Medicaid Other

## 2023-03-19 ENCOUNTER — Other Ambulatory Visit: Payer: Self-pay

## 2023-03-19 DIAGNOSIS — Z91199 Patient's noncompliance with other medical treatment and regimen due to unspecified reason: Secondary | ICD-10-CM

## 2023-03-19 DIAGNOSIS — F17210 Nicotine dependence, cigarettes, uncomplicated: Secondary | ICD-10-CM | POA: Diagnosis present

## 2023-03-19 DIAGNOSIS — E440 Moderate protein-calorie malnutrition: Secondary | ICD-10-CM | POA: Diagnosis present

## 2023-03-19 DIAGNOSIS — Z681 Body mass index (BMI) 19 or less, adult: Secondary | ICD-10-CM

## 2023-03-19 DIAGNOSIS — E860 Dehydration: Secondary | ICD-10-CM | POA: Diagnosis present

## 2023-03-19 DIAGNOSIS — F1220 Cannabis dependence, uncomplicated: Secondary | ICD-10-CM | POA: Diagnosis present

## 2023-03-19 DIAGNOSIS — Z781 Physical restraint status: Secondary | ICD-10-CM | POA: Diagnosis not present

## 2023-03-19 DIAGNOSIS — Z79899 Other long term (current) drug therapy: Secondary | ICD-10-CM

## 2023-03-19 DIAGNOSIS — R1013 Epigastric pain: Secondary | ICD-10-CM | POA: Diagnosis not present

## 2023-03-19 DIAGNOSIS — G9340 Encephalopathy, unspecified: Secondary | ICD-10-CM | POA: Diagnosis not present

## 2023-03-19 DIAGNOSIS — R54 Age-related physical debility: Secondary | ICD-10-CM | POA: Diagnosis present

## 2023-03-19 DIAGNOSIS — E1110 Type 2 diabetes mellitus with ketoacidosis without coma: Secondary | ICD-10-CM | POA: Diagnosis not present

## 2023-03-19 DIAGNOSIS — T383X6A Underdosing of insulin and oral hypoglycemic [antidiabetic] drugs, initial encounter: Secondary | ICD-10-CM | POA: Diagnosis present

## 2023-03-19 DIAGNOSIS — R64 Cachexia: Secondary | ICD-10-CM | POA: Diagnosis present

## 2023-03-19 DIAGNOSIS — S0181XA Laceration without foreign body of other part of head, initial encounter: Secondary | ICD-10-CM | POA: Diagnosis not present

## 2023-03-19 DIAGNOSIS — F200 Paranoid schizophrenia: Secondary | ICD-10-CM | POA: Diagnosis present

## 2023-03-19 DIAGNOSIS — E1010 Type 1 diabetes mellitus with ketoacidosis without coma: Secondary | ICD-10-CM | POA: Diagnosis not present

## 2023-03-19 DIAGNOSIS — Z91148 Patient's other noncompliance with medication regimen for other reason: Secondary | ICD-10-CM

## 2023-03-19 DIAGNOSIS — E876 Hypokalemia: Secondary | ICD-10-CM | POA: Diagnosis present

## 2023-03-19 DIAGNOSIS — E10649 Type 1 diabetes mellitus with hypoglycemia without coma: Secondary | ICD-10-CM | POA: Diagnosis not present

## 2023-03-19 DIAGNOSIS — F330 Major depressive disorder, recurrent, mild: Secondary | ICD-10-CM | POA: Diagnosis present

## 2023-03-19 DIAGNOSIS — Z833 Family history of diabetes mellitus: Secondary | ICD-10-CM

## 2023-03-19 DIAGNOSIS — Z794 Long term (current) use of insulin: Secondary | ICD-10-CM

## 2023-03-19 DIAGNOSIS — I7410 Embolism and thrombosis of unspecified parts of aorta: Secondary | ICD-10-CM | POA: Diagnosis present

## 2023-03-19 DIAGNOSIS — K760 Fatty (change of) liver, not elsewhere classified: Secondary | ICD-10-CM | POA: Diagnosis present

## 2023-03-19 DIAGNOSIS — I5022 Chronic systolic (congestive) heart failure: Secondary | ICD-10-CM | POA: Diagnosis present

## 2023-03-19 DIAGNOSIS — I8290 Acute embolism and thrombosis of unspecified vein: Secondary | ICD-10-CM

## 2023-03-19 DIAGNOSIS — R45851 Suicidal ideations: Secondary | ICD-10-CM | POA: Diagnosis not present

## 2023-03-19 DIAGNOSIS — R0989 Other specified symptoms and signs involving the circulatory and respiratory systems: Secondary | ICD-10-CM

## 2023-03-19 DIAGNOSIS — I5020 Unspecified systolic (congestive) heart failure: Secondary | ICD-10-CM

## 2023-03-19 DIAGNOSIS — N179 Acute kidney failure, unspecified: Secondary | ICD-10-CM | POA: Diagnosis present

## 2023-03-19 DIAGNOSIS — G9341 Metabolic encephalopathy: Secondary | ICD-10-CM | POA: Diagnosis present

## 2023-03-19 DIAGNOSIS — F209 Schizophrenia, unspecified: Secondary | ICD-10-CM | POA: Diagnosis not present

## 2023-03-19 DIAGNOSIS — G4700 Insomnia, unspecified: Secondary | ICD-10-CM | POA: Diagnosis present

## 2023-03-19 DIAGNOSIS — E8809 Other disorders of plasma-protein metabolism, not elsewhere classified: Secondary | ICD-10-CM | POA: Diagnosis not present

## 2023-03-19 DIAGNOSIS — Z91128 Patient's intentional underdosing of medication regimen for other reason: Secondary | ICD-10-CM

## 2023-03-19 DIAGNOSIS — Z9151 Personal history of suicidal behavior: Secondary | ICD-10-CM

## 2023-03-19 LAB — I-STAT VENOUS BLOOD GAS, ED
Acid-base deficit: 28 mmol/L — ABNORMAL HIGH (ref 0.0–2.0)
Bicarbonate: 4 mmol/L — ABNORMAL LOW (ref 20.0–28.0)
Calcium, Ion: 1.07 mmol/L — ABNORMAL LOW (ref 1.15–1.40)
HCT: 47 % (ref 39.0–52.0)
Hemoglobin: 16 g/dL (ref 13.0–17.0)
O2 Saturation: 71 %
Potassium: 4.7 mmol/L (ref 3.5–5.1)
Sodium: 119 mmol/L — CL (ref 135–145)
TCO2: <5 mmol/L — ABNORMAL LOW (ref 22–32)
pCO2, Ven: 20.2 mmHg — ABNORMAL LOW (ref 44–60)
pH, Ven: 6.899 — CL (ref 7.25–7.43)
pO2, Ven: 61 mmHg — ABNORMAL HIGH (ref 32–45)

## 2023-03-19 LAB — I-STAT CHEM 8, ED
BUN: 34 mg/dL — ABNORMAL HIGH (ref 6–20)
Calcium, Ion: 1.09 mmol/L — ABNORMAL LOW (ref 1.15–1.40)
Chloride: 90 mmol/L — ABNORMAL LOW (ref 98–111)
Creatinine, Ser: 1.5 mg/dL — ABNORMAL HIGH (ref 0.61–1.24)
Glucose, Bld: >700 mg/dL (ref 70–99)
HCT: 48 % (ref 39.0–52.0)
Hemoglobin: 16.3 g/dL (ref 13.0–17.0)
Potassium: 4.7 mmol/L (ref 3.5–5.1)
Sodium: 120 mmol/L — ABNORMAL LOW (ref 135–145)
TCO2: 7 mmol/L — ABNORMAL LOW (ref 22–32)

## 2023-03-19 LAB — BLOOD GAS, VENOUS
Acid-base deficit: 11.9 mmol/L — ABNORMAL HIGH (ref 0.0–2.0)
Bicarbonate: 12.6 mmol/L — ABNORMAL LOW (ref 20.0–28.0)
Drawn by: 164
O2 Saturation: 94.5 %
Patient temperature: 37
pCO2, Ven: 25 mmHg — ABNORMAL LOW (ref 44–60)
pH, Ven: 7.31 (ref 7.25–7.43)
pO2, Ven: 63 mmHg — ABNORMAL HIGH (ref 32–45)

## 2023-03-19 LAB — LACTIC ACID, PLASMA
Lactic Acid, Venous: >9 mmol/L (ref 0.5–1.9)
Lactic Acid, Venous: 8.7 mmol/L (ref 0.5–1.9)
Lactic Acid, Venous: >9 mmol/L (ref 0.5–1.9)

## 2023-03-19 LAB — URINALYSIS, ROUTINE W REFLEX MICROSCOPIC
Bilirubin Urine: NEGATIVE
Glucose, UA: >=500 mg/dL — AB
Ketones, ur: 80 mg/dL — AB
Leukocytes,Ua: NEGATIVE
Nitrite: NEGATIVE
Protein, ur: 30 mg/dL — AB
Specific Gravity, Urine: 1.016 (ref 1.005–1.030)
pH: 5 (ref 5.0–8.0)

## 2023-03-19 LAB — CBG MONITORING, ED
Glucose-Capillary: >600 mg/dL (ref 70–99)
Glucose-Capillary: >600 mg/dL (ref 70–99)
Glucose-Capillary: >600 mg/dL (ref 70–99)
Glucose-Capillary: >600 mg/dL (ref 70–99)
Glucose-Capillary: >600 mg/dL (ref 70–99)

## 2023-03-19 LAB — BASIC METABOLIC PANEL
Anion gap: 19 — ABNORMAL HIGH (ref 5–15)
BUN: 21 mg/dL — ABNORMAL HIGH (ref 6–20)
CO2: 18 mmol/L — ABNORMAL LOW (ref 22–32)
Calcium: 8.6 mg/dL — ABNORMAL LOW (ref 8.9–10.3)
Calcium: 8.4 mg/dL — ABNORMAL LOW (ref 8.9–10.3)
Chloride: 91 mmol/L — ABNORMAL LOW (ref 98–111)
Creatinine, Ser: 1.42 mg/dL — ABNORMAL HIGH (ref 0.61–1.24)
GFR, Estimated: >60 mL/min (ref >60)
Glucose, Bld: 248 mg/dL — ABNORMAL HIGH (ref 70–99)
Potassium: 3.2 mmol/L — ABNORMAL LOW (ref 3.5–5.1)
Sodium: 127 mmol/L — ABNORMAL LOW (ref 135–145)
Sodium: 128 mmol/L — ABNORMAL LOW (ref 135–145)

## 2023-03-19 LAB — ETHANOL: Alcohol, Ethyl (B): <10 mg/dL (ref <10)

## 2023-03-19 LAB — CBC WITH DIFFERENTIAL/PLATELET
Abs Immature Granulocytes: 0.57 10*3/uL — ABNORMAL HIGH (ref 0.00–0.07)
Basophils Absolute: 0.1 10*3/uL (ref 0.0–0.1)
Basophils Relative: 1 %
Eosinophils Absolute: 0 10*3/uL (ref 0.0–0.5)
Eosinophils Relative: 0 %
HCT: 46 % (ref 39.0–52.0)
Hemoglobin: 13.4 g/dL (ref 13.0–17.0)
Immature Granulocytes: 5 %
Lymphocytes Relative: 16 %
Lymphs Abs: 2.1 10*3/uL (ref 0.7–4.0)
MCH: 34.4 pg — ABNORMAL HIGH (ref 26.0–34.0)
MCHC: 29.1 g/dL — ABNORMAL LOW (ref 30.0–36.0)
MCV: 117.9 fL — ABNORMAL HIGH (ref 80.0–100.0)
Monocytes Absolute: 0.9 10*3/uL (ref 0.1–1.0)
Monocytes Relative: 7 %
Neutro Abs: 9 10*3/uL — ABNORMAL HIGH (ref 1.7–7.7)
Neutrophils Relative %: 71 %
Platelets: 448 10*3/uL — ABNORMAL HIGH (ref 150–400)
RBC: 3.9 MIL/uL — ABNORMAL LOW (ref 4.22–5.81)
RDW: 12.9 % (ref 11.5–15.5)
WBC: 12.6 10*3/uL — ABNORMAL HIGH (ref 4.0–10.5)
nRBC: 0 % (ref 0.0–0.2)

## 2023-03-19 LAB — CBC
HCT: 36 % — ABNORMAL LOW (ref 39.0–52.0)
Hemoglobin: 11.1 g/dL — ABNORMAL LOW (ref 13.0–17.0)
MCH: 26.9 pg (ref 26.0–34.0)
MCHC: 30.8 g/dL (ref 30.0–36.0)
MCV: 87.2 fL (ref 80.0–100.0)
Platelets: 367 10*3/uL (ref 150–400)
RBC: 4.13 MIL/uL — ABNORMAL LOW (ref 4.22–5.81)
RDW: 15.5 % (ref 11.5–15.5)
WBC: 9 10*3/uL (ref 4.0–10.5)
nRBC: 0 % (ref 0.0–0.2)

## 2023-03-19 LAB — LIPASE, BLOOD: Lipase: 403 U/L — ABNORMAL HIGH (ref 11–51)

## 2023-03-19 LAB — GLUCOSE, CAPILLARY
Glucose-Capillary: >600 mg/dL (ref 70–99)
Glucose-Capillary: 575 mg/dL (ref 70–99)
Glucose-Capillary: >600 mg/dL (ref 70–99)
Glucose-Capillary: 419 mg/dL — ABNORMAL HIGH (ref 70–99)
Glucose-Capillary: 474 mg/dL — ABNORMAL HIGH (ref 70–99)
Glucose-Capillary: 373 mg/dL — ABNORMAL HIGH (ref 70–99)
Glucose-Capillary: 289 mg/dL — ABNORMAL HIGH (ref 70–99)
Glucose-Capillary: 237 mg/dL — ABNORMAL HIGH (ref 70–99)
Glucose-Capillary: 225 mg/dL — ABNORMAL HIGH (ref 70–99)
Glucose-Capillary: 198 mg/dL — ABNORMAL HIGH (ref 70–99)
Glucose-Capillary: 183 mg/dL — ABNORMAL HIGH (ref 70–99)
Glucose-Capillary: 187 mg/dL — ABNORMAL HIGH (ref 70–99)

## 2023-03-19 LAB — COMPREHENSIVE METABOLIC PANEL
ALT: 250 U/L — ABNORMAL HIGH (ref 0–44)
AST: 96 U/L — ABNORMAL HIGH (ref 15–41)
Albumin: 4.2 g/dL (ref 3.5–5.0)
Alkaline Phosphatase: 299 U/L — ABNORMAL HIGH (ref 38–126)
BUN: 31 mg/dL — ABNORMAL HIGH (ref 6–20)
CO2: <7 mmol/L — ABNORMAL LOW (ref 22–32)
Calcium: 9.1 mg/dL (ref 8.9–10.3)
Chloride: 79 mmol/L — ABNORMAL LOW (ref 98–111)
Creatinine, Ser: 2.44 mg/dL — ABNORMAL HIGH (ref 0.61–1.24)
GFR, Estimated: 32 mL/min — ABNORMAL LOW (ref >60)
Glucose, Bld: >1200 mg/dL (ref 70–99)
Potassium: 4.8 mmol/L (ref 3.5–5.1)
Sodium: 125 mmol/L — ABNORMAL LOW (ref 135–145)
Total Bilirubin: 1.7 mg/dL — ABNORMAL HIGH (ref 0.3–1.2)
Total Protein: 7.2 g/dL (ref 6.5–8.1)

## 2023-03-19 LAB — RAPID URINE DRUG SCREEN, HOSP PERFORMED
Amphetamines: NOT DETECTED
Barbiturates: NOT DETECTED
Benzodiazepines: NOT DETECTED
Cocaine: NOT DETECTED
Opiates: NOT DETECTED
Tetrahydrocannabinol: POSITIVE — AB

## 2023-03-19 LAB — TROPONIN I (HIGH SENSITIVITY)
Troponin I (High Sensitivity): 38 ng/L — ABNORMAL HIGH (ref <18)
Troponin I (High Sensitivity): 38 ng/L — ABNORMAL HIGH (ref <18)

## 2023-03-19 LAB — BETA-HYDROXYBUTYRIC ACID: Beta-Hydroxybutyric Acid: >8 mmol/L — ABNORMAL HIGH (ref 0.05–0.27)

## 2023-03-19 LAB — MAGNESIUM: Magnesium: 1.9 mg/dL (ref 1.7–2.4)

## 2023-03-19 LAB — MRSA NEXT GEN BY PCR, NASAL: MRSA by PCR Next Gen: NOT DETECTED

## 2023-03-19 MED ORDER — METRONIDAZOLE 500 MG/100ML IV SOLN
500.0000 mg | Freq: Once | INTRAVENOUS | Status: AC
  Administered 2023-03-19: 500 mg via INTRAVENOUS
  Filled 2023-03-19: qty 100

## 2023-03-19 MED ORDER — HEPARIN BOLUS VIA INFUSION
2000.0000 [IU] | Freq: Once | INTRAVENOUS | Status: AC
  Administered 2023-03-19: 2000 [IU] via INTRAVENOUS
  Filled 2023-03-19: qty 2000

## 2023-03-19 MED ORDER — ORAL CARE MOUTH RINSE: 15.0000 mL | OROMUCOSAL | Status: DC | PRN

## 2023-03-19 MED ORDER — POTASSIUM CHLORIDE CRYS ER 20 MEQ PO TBCR
40.0000 meq | EXTENDED_RELEASE_TABLET | Freq: Once | ORAL | Status: AC
  Administered 2023-03-19: 40 meq via ORAL
  Filled 2023-03-19: qty 2

## 2023-03-19 MED ORDER — POLYETHYLENE GLYCOL 3350 17 G PO PACK: 17.0000 g | PACK | Freq: Every day | ORAL | Status: DC | PRN

## 2023-03-19 MED ORDER — ASPIRIN 81 MG PO CHEW
81.0000 mg | CHEWABLE_TABLET | Freq: Every day | ORAL | Status: DC
  Administered 2023-03-20 – 2023-03-23 (×4): 81 mg via ORAL
  Filled 2023-03-19 (×4): qty 1

## 2023-03-19 MED ORDER — ONDANSETRON HCL 4 MG/2ML IJ SOLN
4.0000 mg | Freq: Four times a day (QID) | INTRAMUSCULAR | Status: DC | PRN
  Administered 2023-03-19 – 2023-03-20 (×2): 4 mg via INTRAVENOUS
  Filled 2023-03-19 (×2): qty 2

## 2023-03-19 MED ORDER — DEXTROSE IN LACTATED RINGERS 5 % IV SOLN: INTRAVENOUS | Status: DC

## 2023-03-19 MED ORDER — THIAMINE MONONITRATE 100 MG PO TABS
100.0000 mg | ORAL_TABLET | Freq: Every day | ORAL | Status: DC
  Administered 2023-03-19 – 2023-03-23 (×5): 100 mg via ORAL
  Filled 2023-03-19 (×5): qty 1

## 2023-03-19 MED ORDER — IOHEXOL 9 MG/ML PO SOLN: 500.0000 mL | ORAL | Status: DC

## 2023-03-19 MED ORDER — INSULIN REGULAR(HUMAN) IN NACL 100-0.9 UT/100ML-% IV SOLN
INTRAVENOUS | Status: DC
  Administered 2023-03-19: 7 [IU]/h via INTRAVENOUS
  Administered 2023-03-20: 2.6 [IU]/h via INTRAVENOUS
  Filled 2023-03-19 (×2): qty 100

## 2023-03-19 MED ORDER — POTASSIUM CHLORIDE 10 MEQ/100ML IV SOLN
10.0000 meq | INTRAVENOUS | Status: AC
  Administered 2023-03-19 (×2): 10 meq via INTRAVENOUS
  Filled 2023-03-19 (×2): qty 100

## 2023-03-19 MED ORDER — VANCOMYCIN HCL IN DEXTROSE 1-5 GM/200ML-% IV SOLN
1000.0000 mg | INTRAVENOUS | Status: DC
  Filled 2023-03-19: qty 200

## 2023-03-19 MED ORDER — ADULT MULTIVITAMIN W/MINERALS CH
1.0000 | ORAL_TABLET | Freq: Every day | ORAL | Status: DC
  Administered 2023-03-19 – 2023-03-23 (×5): 1 via ORAL
  Filled 2023-03-19 (×5): qty 1

## 2023-03-19 MED ORDER — STERILE WATER FOR INJECTION IV SOLN
INTRAVENOUS | Status: AC
  Filled 2023-03-19: qty 1000

## 2023-03-19 MED ORDER — POTASSIUM CHLORIDE 10 MEQ/100ML IV SOLN
10.0000 meq | INTRAVENOUS | Status: AC
  Administered 2023-03-19 – 2023-03-20 (×6): 10 meq via INTRAVENOUS
  Filled 2023-03-19 (×3): qty 100

## 2023-03-19 MED ORDER — FENTANYL CITRATE PF 50 MCG/ML IJ SOSY
12.5000 ug | PREFILLED_SYRINGE | Freq: Once | INTRAMUSCULAR | Status: AC
  Administered 2023-03-19: 12.5 ug via INTRAVENOUS

## 2023-03-19 MED ORDER — HEPARIN (PORCINE) 25000 UT/250ML-% IV SOLN
1000.0000 [IU]/h | INTRAVENOUS | Status: DC
  Administered 2023-03-19 – 2023-03-21 (×2): 800 [IU]/h via INTRAVENOUS
  Administered 2023-03-22: 1000 [IU]/h via INTRAVENOUS
  Filled 2023-03-19 (×3): qty 250

## 2023-03-19 MED ORDER — LACTATED RINGERS IV BOLUS
1000.0000 mL | Freq: Once | INTRAVENOUS | Status: AC
  Administered 2023-03-19: 1000 mL via INTRAVENOUS

## 2023-03-19 MED ORDER — FOLIC ACID 1 MG PO TABS
1.0000 mg | ORAL_TABLET | Freq: Every day | ORAL | Status: DC
  Administered 2023-03-19 – 2023-03-23 (×5): 1 mg via ORAL
  Filled 2023-03-19 (×5): qty 1

## 2023-03-19 MED ORDER — FENTANYL CITRATE PF 50 MCG/ML IJ SOSY
12.5000 ug | PREFILLED_SYRINGE | INTRAMUSCULAR | Status: DC | PRN
  Filled 2023-03-19: qty 1

## 2023-03-19 MED ORDER — CALCIUM GLUCONATE-NACL 1-0.675 GM/50ML-% IV SOLN
1.0000 g | Freq: Once | INTRAVENOUS | Status: AC
  Administered 2023-03-19: 1000 mg via INTRAVENOUS
  Filled 2023-03-19: qty 50

## 2023-03-19 MED ORDER — ASPIRIN 300 MG RE SUPP
150.0000 mg | Freq: Once | RECTAL | Status: AC
  Administered 2023-03-19: 150 mg via RECTAL
  Filled 2023-03-19: qty 1

## 2023-03-19 MED ORDER — DEXTROSE 50 % IV SOLN
0.0000 mL | INTRAVENOUS | Status: DC | PRN
  Administered 2023-03-20: 50 mL via INTRAVENOUS
  Filled 2023-03-19: qty 50

## 2023-03-19 MED ORDER — HEPARIN SODIUM (PORCINE) 5000 UNIT/ML IJ SOLN
5000.0000 [IU] | Freq: Three times a day (TID) | INTRAMUSCULAR | Status: DC
  Administered 2023-03-19: 5000 [IU] via SUBCUTANEOUS
  Filled 2023-03-19: qty 1

## 2023-03-19 MED ORDER — SODIUM CHLORIDE 0.9 % IV SOLN
2.0000 g | Freq: Two times a day (BID) | INTRAVENOUS | Status: DC
  Administered 2023-03-19 (×2): 2 g via INTRAVENOUS
  Filled 2023-03-19 (×2): qty 12.5

## 2023-03-19 MED ORDER — FENTANYL CITRATE PF 50 MCG/ML IJ SOSY
PREFILLED_SYRINGE | INTRAMUSCULAR | Status: AC
  Filled 2023-03-19: qty 1

## 2023-03-19 MED ORDER — IOHEXOL 350 MG/ML SOLN
75.0000 mL | Freq: Once | INTRAVENOUS | Status: AC | PRN
  Administered 2023-03-19: 75 mL via INTRAVENOUS

## 2023-03-19 MED ORDER — LORAZEPAM 2 MG/ML IJ SOLN
1.0000 mg | Freq: Once | INTRAMUSCULAR | Status: AC | PRN
  Administered 2023-03-19: 1 mg via INTRAVENOUS
  Filled 2023-03-19: qty 1

## 2023-03-19 MED ORDER — LACTATED RINGERS IV BOLUS
20.0000 mL/kg | Freq: Once | INTRAVENOUS | Status: AC
  Administered 2023-03-19: 1180 mL via INTRAVENOUS

## 2023-03-19 MED ORDER — ASPIRIN 81 MG PO CHEW: 81.0000 mg | CHEWABLE_TABLET | Freq: Every day | ORAL | Status: DC

## 2023-03-19 MED ORDER — LACTATED RINGERS IV SOLN: INTRAVENOUS | Status: DC

## 2023-03-19 MED ORDER — DOCUSATE SODIUM 100 MG PO CAPS: 100.0000 mg | ORAL_CAPSULE | Freq: Two times a day (BID) | ORAL | Status: DC | PRN

## 2023-03-19 MED ORDER — LACTATED RINGERS IV BOLUS
2000.0000 mL | Freq: Once | INTRAVENOUS | Status: AC
  Administered 2023-03-19: 2000 mL via INTRAVENOUS

## 2023-03-19 MED ORDER — VANCOMYCIN HCL 1250 MG/250ML IV SOLN
1250.0000 mg | Freq: Once | INTRAVENOUS | Status: AC
  Administered 2023-03-19: 1250 mg via INTRAVENOUS
  Filled 2023-03-19: qty 250

## 2023-03-19 NOTE — ED Notes (Addendum)
Pt stuck 4 times, unable to obtain blood cultures.MD notified.

## 2023-03-19 NOTE — Progress Notes (Signed)
PCCM interval progress note:   Called to the bedside as patient complaining of pain and appears very uncomfortable, pain is migratory, first abdomen, then chest now "everywhere".  On exam, he has pain in the epigastric area.  EKG without signs of ischemia, he is hemodynamically stable.   -low dose fentanyl, he looks very uncomfortable -CT abd/pelvis stat -cycle troponins and check lipase   Darcella Gasman Keneisha Heckart, PA-C [facility] See Amion for pager If no response to pager , please call [phone] until 7pm After 7:00 pm call Elink  [phone]

## 2023-03-19 NOTE — H&P (Signed)
NAME:  Tom Anderson, MRN:  [id number], DOB:  1971-09-30, LOS: 0 ADMISSION DATE:  03/19/2023, CONSULTATION DATE:  03/19/23 REFERRING MD:  EDP, CHIEF COMPLAINT:  weakness, light-headed  History of Present Illness:  52 y.o. M with PMH significant for type 1 DM, schizophrenia, asthma, depression, tobacco use with two recent admissions for DKA is brought in by parents after he stopped taking his insulin several days ago. Mom says sometimes he does this without clear reason, seems to think he does not need it sometimes and she cannot reason with him.  He has not been eating and has been lethargic.  No fevers, chills, diarrhea, has had some vomiting.   Mom says he used to drink ETOH, but stopped about four months ago, occasionally smokes weed, no other drug use.    In the ED, initial glucose reading >1200, bicarb <7, anion gap not calculated, creatinine 2.4, AST 96, ALT 250, bili 1.7, beta hydroxy >8,  pH 6.89.    Given his encephalopathy and severity of DKA, PCCM consulted for admission.  He was started on an insulin drip and given 2L LR bolus, 3 amps bicarb and broad spectrum abx.  Pt was moaning but following commands and protecting his airway  Pertinent  Medical History   has a past medical history of Asthma, Depression, Diabetes mellitus without complication (HCC), and Schizophrenia (HCC).   Significant Hospital Events: Including procedures, antibiotic start and stop dates in addition to other pertinent events   4/19 admit with DKA  Interim History / Subjective:  As above   Objective   Blood pressure (!) 140/106, pulse (!) 103, temperature (!) 92.8 F (33.8 C), temperature source Rectal, resp. rate 18, weight 59 kg, SpO2 99 %.       No intake or output data in the 24 hours ending 03/19/23 1034 Filed Weights   03/19/23 0909  Weight: 59 kg   General:  very thin, poorly nourished M with temporal wasting, restless but not in severe distress HEENT: MM pink/moist, sclera anicteric Neuro:  restless and moaning but following commands and moving all extremities, non-focal  CV: s1s2 rrr, no m/r/g PULM:  mild expiratory wheezing bilaterally  GI: soft, non-tender  Extremities: warm/dry, decreased muscle tone and bulk, no edema  Skin: no rashes or lesions  Resolved Hospital Problem list     Assessment & Plan:    Type 1 DM in acute DKA with mild metabolic encephalopathy Admit to ICU, received bicarb IVF bolus  -q 4hr BMP and repeat BHOB -received K, closely monitor, check mag -insulin gtt via endotool -repeat VBG 1400 -continue LR /hr -NPO -received broad spectrum abx in the ED, but do not see evidence of infection so will hold further doses for now   AKI -likely secondary to volume depletion, presenting creatinine 2.4, baseline normal  -continue IVF, monitor renal function and UOP, renally dose meds    Elevated transaminases  Hyperbilirubinemia  -unclear etiology, possibly ETOH which mom says has recently stopped -mildly elevated, check repeat in the AM, supportive care -may need RUQ [place] -ETOH level    Protein calorie malnutrition  POA -NPO now then nutrition consult when off insulin gtt -thiamine, folic acid, MV     Hx Schizophrenia  Do not see any medications at home, may need IP psych consult     Best Practice (right click and "Reselect all SmartList Selections" daily)   Diet/type: NPO DVT prophylaxis: prophylactic heparin  GI prophylaxis: N/A Lines: N/A Foley:  N/A Code Status:  full code Last date of multidisciplinary goals of care discussion [pending, parents updated at the bedside ]  Labs   CBC: Recent Labs  Lab 03/19/23 0913 03/19/23 0937 03/19/23 0939  WBC 12.6*  --   --   NEUTROABS PENDING  --   --   HGB 13.4 16.3 16.0  HCT 46.0 48.0 47.0  MCV 117.9*  --   --   PLT 448*  --   --     Basic Metabolic Panel: Recent Labs  Lab 03/19/23 0937 03/19/23 0939  NA 120* 119*  K 4.7 4.7  CL 90*  --   GLUCOSE >700*   --   BUN 34*  --   CREATININE 1.50*  --    GFR: Estimated Creatinine Clearance: 49.7 mL/min (A) (by C-G formula based on SCr of 1.5 mg/dL (H)). Recent Labs  Lab 03/19/23 0913  WBC 12.6*    Liver Function Tests: No results for input(s): "AST", "ALT", "ALKPHOS", "BILITOT", "PROT", "ALBUMIN" in the last 168 hours. No results for input(s): "LIPASE", "AMYLASE" in the last 168 hours. No results for input(s): "AMMONIA" in the last 168 hours.  ABG    Component Value Date/Time   PHART 7.485 (H) 10/04/2020 0210   PCO2ART 25.5 (L) 10/04/2020 0210   PO2ART 152 (H) 10/04/2020 0210   HCO3 4.0 (L) 03/19/2023 0939   TCO2 <5 (L) 03/19/2023 0939   ACIDBASEDEF 28.0 (H) 03/19/2023 0939   O2SAT 71 03/19/2023 0939     Coagulation Profile: No results for input(s): "INR", "PROTIME" in the last 168 hours.  Cardiac Enzymes: No results for input(s): "CKTOTAL", "CKMB", "CKMBINDEX", "TROPONINI" in the last 168 hours.  HbA1C: Hgb A1c MFr Bld  Date/Time Value Ref Range Status  01/12/2023 04:11 PM 11.8 (H) 4.8 - 5.6 % Final    Comment:    (NOTE) Pre diabetes:          5.7%-6.4%  Diabetes:              >6.4%  Glycemic control for   <7.0% adults with diabetes   10/06/2020 07:26 AM 13.8 (H) 4.8 - 5.6 % Final    Comment:    (NOTE) Pre diabetes:          5.7%-6.4%  Diabetes:              >6.4%  Glycemic control for   <7.0% adults with diabetes     CBG: Recent Labs  Lab 03/19/23 0907  GLUCAP >600*    Review of Systems:   Unable to obtain 2/2 encephalopathy  Past Medical History:  He,  has a past medical history of Asthma, Depression, Diabetes mellitus without complication (HCC), and Schizophrenia (HCC).   Surgical History:   Past Surgical History:  Procedure Laterality Date   HERNIA REPAIR       Social History:   reports that he has been smoking cigarettes. He has been smoking an average of 2.50 packs per day. He has never used smokeless tobacco. He reports current alcohol  use. He reports current drug use. Drug: Marijuana.   Family History:  His family history includes Diabetes in his maternal grandfather.   Allergies Allergies  Allergen Reactions   Nitrous Oxide Nausea And Vomiting     Home Medications  Prior to Admission medications   Medication Sig Start Date End Date Taking? Authorizing Provider  Calcium Carbonate Antacid (TUMS PO) Take 1 tablet by mouth daily as needed (heartburn).    [provider]  feeding supplement, GLUCERNA  SHAKE, (GLUCERNA SHAKE) LIQD Take 237 mLs by mouth 2 (two) times daily between meals. 01/15/23   [name], MD  folic acid (FOLVITE) 1 MG tablet Take 1 tablet (1 mg total) by mouth daily. 01/15/23   [name], MD  insulin aspart (NOVOLOG) 100 UNIT/ML FlexPen For glucose 121 to 150 use 1 unit, for 151 to 200 use 2 units, for 201 to 250 use 3 units, for 251 to 300 use 5 units, for 301 to 350 use 7 units for 351 or greater use 9 units. Patient taking differently: Inject 1-18 Units into the skin in the morning, at noon, in the evening, and at bedtime. 10/07/20   [name], [name], MD  insulin detemir (LEVEMIR) 100 UNIT/ML FlexPen Inject 15 Units into the skin daily. Patient taking differently: Inject 8 Units into the skin at bedtime. 10/07/20   [name], [name], MD  metoCLOPramide (REGLAN) 5 MG tablet Take 1 tablet (5 mg total) by mouth every 8 (eight) hours as needed for nausea. 02/12/23   [name], MD  Multiple Vitamin (MULTIVITAMIN WITH MINERALS) TABS tablet Take 1 tablet by mouth daily. 01/15/23   [name], MD  pantoprazole (PROTONIX) 40 MG tablet Take 1 tablet (40 mg total) by mouth daily at 12 noon. 02/12/23   [name], MD  potassium chloride (KLOR-CON M) 10 MEQ tablet Take 5 tablets (50 mEq total) by mouth once for 1 dose. 01/15/23 01/15/23  [name], MD  thiamine (VITAMIN B-1) 100 MG tablet Take 1 tablet (100 mg total) by mouth daily. 01/15/23   [name], MD   VENTOLIN HFA 108 [phone] Base) MCG/ACT inhaler Inhale 2 puffs into the lungs as needed for shortness of breath or wheezing. 11/20/22   [provider]     Critical care time:  40 minutes    CRITICAL CARE Performed by: Darcella Gasman Natia Fahmy   Total critical care time: 40 minutes  Critical care time was exclusive of separately billable procedures and treating other patients.  Critical care was necessary to treat or prevent imminent or life-threatening deterioration.  Critical care was time spent personally by me on the following activities: development of treatment plan with patient and/or surrogate as well as nursing, discussions with consultants, evaluation of patient's response to treatment, examination of patient, obtaining history from patient or surrogate, ordering and performing treatments and interventions, ordering and review of laboratory studies, ordering and review of radiographic studies, pulse oximetry and re-evaluation of patient's condition.   Darcella Gasman Neita Landrigan, PA-C [facility] See Amion for pager If no response to pager , please call [phone] until 7pm After 7:00 pm call Elink  [phone]

## 2023-03-19 NOTE — ED Triage Notes (Signed)
Mother stated, he is light headed , continuous to drink water. His diabetes is hard to control his diabetes.  Pt is in DKA.

## 2023-03-19 NOTE — Progress Notes (Signed)
ANTICOAGULATION CONSULT NOTE - Initial Consult  Pharmacy Consult for Heparin Indication:  aortic emboli  Allergies  Allergen Reactions   Nitrous Oxide Nausea And Vomiting    Patient Measurements: Weight: 51.6 kg (113 lb 12.1 oz) Heparin Dosing Weight: 51.6kg  Vital Signs: Temp: 97.3 F (36.3 C) (04/19 1355) Temp Source: Axillary (04/19 1355) BP: 129/87 (04/19 1800) Pulse Rate: 98 (04/19 1800)  Labs: Recent Labs    03/19/23 0913 03/19/23 0937 03/19/23 0939 03/19/23 1010 03/19/23 1449  HGB 13.4 16.3 16.0 11.1*  --   HCT 46.0 48.0 47.0 36.0*  --   PLT 448*  --   --  367  --   CREATININE 2.44* 1.50*  --   --  1.91*    Estimated Creatinine Clearance: 34.1 mL/min (A) (by C-G formula based on SCr of 1.91 mg/dL (H)).   Medical History: Past Medical History:  Diagnosis Date   Asthma    Depression    Diabetes mellitus without complication (HCC)    Schizophrenia (HCC)     Medications:  Scheduled:   aspirin  81 mg Oral Daily   folic acid  1 mg Oral Daily   multivitamin with minerals  1 tablet Oral Daily   thiamine  100 mg Oral Daily    Assessment: 52 yo male presenting with sepsis and AKI. Noted to have epigastric pain. 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 No AC PTA. Received 1 dose of SQH ~1530 this afternoon.  Goal of Therapy:  Heparin level 0.3-0.7 units/ml Monitor platelets by anticoagulation protocol: Yes   Plan:  Small heparin bolus of 2000 units bolus IV x1 Start heparin drip at 800 units/hr Heparin level in 6 hours Daily CBC and heparin level  Rexford Maus, PharmD, BCPS 03/19/2023 7:27 PM

## 2023-03-19 NOTE — ED Provider Notes (Signed)
[facility] Anderson EMERGENCY DEPARTMENT AT [facility] Provider Note   CSN: [id number] Arrival date & time: 03/19/23  9562     History {Add pertinent medical, surgical, social history, OB history to HPI:1} Chief Complaint  Patient presents with   Weakness   Hyperglycemia    Tom Anderson is a 52 y.o. male.  HPI     Home Medications Prior to Admission medications   Medication Sig Start Date End Date Taking? Authorizing Provider  Calcium Carbonate Antacid (TUMS PO) Take 1 tablet by mouth daily as needed (heartburn).    [provider]  feeding supplement, GLUCERNA SHAKE, (GLUCERNA SHAKE) LIQD Take 237 mLs by mouth 2 (two) times daily between meals. 01/15/23   Tom Dallas, MD  folic acid (FOLVITE) 1 MG tablet Take 1 tablet (1 mg total) by mouth daily. 01/15/23   Tom Dallas, MD  insulin aspart (NOVOLOG) 100 UNIT/ML FlexPen For glucose 121 to 150 use 1 unit, for 151 to 200 use 2 units, for 201 to 250 use 3 units, for 251 to 300 use 5 units, for 301 to 350 use 7 units for 351 or greater use 9 units. Patient taking differently: Inject 1-18 Units into the skin in the morning, at noon, in the evening, and at bedtime. 10/07/20   [name], Tom Ram, MD  insulin detemir (LEVEMIR) 100 UNIT/ML FlexPen Inject 15 Units into the skin daily. Patient taking differently: Inject 8 Units into the skin at bedtime. 10/07/20   [name], Tom Ram, MD  metoCLOPramide (REGLAN) 5 MG tablet Take 1 tablet (5 mg total) by mouth every 8 (eight) hours as needed for nausea. 02/12/23   Tom Shipper, MD  Multiple Vitamin (MULTIVITAMIN WITH MINERALS) TABS tablet Take 1 tablet by mouth daily. 01/15/23   Tom Dallas, MD  pantoprazole (PROTONIX) 40 MG tablet Take 1 tablet (40 mg total) by mouth daily at 12 noon. 02/12/23   Tom Shipper, MD  potassium chloride (KLOR-CON M) 10 MEQ tablet Take 5 tablets (50 mEq total) by mouth once for 1 dose. 01/15/23 01/15/23  Tom Dallas, MD  thiamine  (VITAMIN B-1) 100 MG tablet Take 1 tablet (100 mg total) by mouth daily. 01/15/23   Tom Dallas, MD  VENTOLIN HFA 108 [phone] Base) MCG/ACT inhaler Inhale 2 puffs into the lungs as needed for shortness of breath or wheezing. 11/20/22   [provider]      Allergies    Nitrous oxide    Review of Systems   Review of Systems  Physical Exam Updated Vital Signs BP (!) 144/95 (BP Location: Left Arm)   Pulse (!) 121   Resp (!) 34   SpO2 94%  Physical Exam  ED Results / Procedures / Treatments   Labs (all labs ordered are listed, but only abnormal results are displayed) Labs Reviewed  CBG MONITORING, ED - Abnormal; Notable for the following components:      Result Value   Glucose-Capillary >600 (*)    All other components within normal limits  I-STAT CHEM 8, ED - Abnormal; Notable for the following components:   Sodium 120 (*)    Chloride 90 (*)    BUN 34 (*)    Creatinine, Ser 1.50 (*)    Glucose, Bld >700 (*)    Calcium, Ion 1.09 (*)    TCO2 7 (*)    All other components within normal limits  I-STAT VENOUS BLOOD GAS, ED - Abnormal; Notable for the following components:   pH,  Ven 6.899 (*)    pCO2, Ven 20.2 (*)    pO2, Ven 61 (*)    Bicarbonate 4.0 (*)    TCO2 <5 (*)    Acid-base deficit 28.0 (*)    Sodium 119 (*)    Calcium, Ion 1.07 (*)    All other components within normal limits  LACTIC ACID, PLASMA  LACTIC ACID, PLASMA  BETA-HYDROXYBUTYRIC ACID  CBC WITH DIFFERENTIAL/PLATELET  COMPREHENSIVE METABOLIC PANEL  URINALYSIS, ROUTINE W REFLEX MICROSCOPIC  CBG MONITORING, ED  I-STAT VENOUS BLOOD GAS, ED    EKG EKG Interpretation  Date/Time:  Friday March 19 2023 09:24:51 EDT Ventricular Rate:  115 PR Interval:  154 QRS Duration: 100 QT Interval:  332 QTC Calculation: 460 R Axis:   109 Text Interpretation: Sinus tachycardia Atrial premature complex Right atrial enlargement Right axis deviation No significant change since last tracing Confirmed by  Tom Anderson Monday ([id number]) on 03/19/2023 9:35:21 AM  Radiology No results found.  Procedures Procedures  {Document cardiac monitor, telemetry assessment procedure when appropriate:1}  Medications Ordered in ED Medications  lactated ringers bolus 1,000 mL (has no administration in time range)  lactated ringers bolus 20 mL/kg (has no administration in time range)  insulin regular, human (MYXREDLIN) 100 units/ 100 mL infusion (has no administration in time range)  lactated ringers infusion (has no administration in time range)  dextrose 5 % in lactated ringers infusion (has no administration in time range)  dextrose 50 % solution 0-50 mL (has no administration in time range)  potassium chloride 10 mEq in 100 mL IVPB (has no administration in time range)  sodium bicarbonate 150 mEq in sterile water 1,150 mL infusion (has no administration in time range)    ED Course/ Medical Decision Making/ A&P   {   Click here for ABCD2, HEART and other calculatorsREFRESH Note before signing :1}                          Medical Decision Making Amount and/or Complexity of Data Reviewed Radiology: ordered.  Risk Prescription drug management.   ***  {Document critical care time when appropriate:1} {Document review of labs and clinical decision tools ie heart score, Chads2Vasc2 etc:1}  {Document your independent review of radiology images, and any outside records:1} {Document your discussion with family members, caretakers, and with consultants:1} {Document social determinants of health affecting pt's care:1} {Document your decision making why or why not admission, treatments were needed:1} Final Clinical Impression(s) / ED Diagnoses Final diagnoses:  None    Rx / DC Orders ED Discharge Orders     None

## 2023-03-19 NOTE — Progress Notes (Signed)
CCM on call  Called by radiology regarding small amount of non obstructing clot on CT.  D/W Dr Chestine Spore from [facility]. ASA 81 and heparin anticoagulation. Repeat CTA arterial phase (chest/abdomen/pelvis) in a few days and after creatinine hopefully normalizes to see if there is resolution. Echocardiogram to evaluate for cardiac source of embolization.   Lynnell Catalan, MD [facility] ICU Physician [facility]  Pager: [phone] Or Epic Secure Chat After hours: [phone].  03/19/2023, 7:23 PM

## 2023-03-19 NOTE — ED Notes (Signed)
Bair hugger applied.

## 2023-03-19 NOTE — Consult Note (Signed)
Hospital Consult    Reason for Consult:  Thrombus descending thoracic aorta and distal abdominal aorta on CT imaging Referring Physician:  critical care MRN #:  [id number]  History of Present Illness: This is a 52 y.o. male with hx type 1 DM and tobacco abuse admitted with DKA that vascular surgery has been consulted for thrombus in the descending thoracic and distal abdominal aorta.  Patient is currently admitted to the ICU under the care of critical care.  CT was obtained due to abdominal pain.  No evidence of lower extremity malperfusion.  Patient seems very sleepy and sedated at bedside.  Unable to provide any history.  Past Medical History:  Diagnosis Date   Asthma    Depression    Diabetes mellitus without complication (HCC)    Schizophrenia (HCC)     Past Surgical History:  Procedure Laterality Date   HERNIA REPAIR      Allergies  Allergen Reactions   Nitrous Oxide Nausea And Vomiting    Prior to Admission medications   Medication Sig Start Date End Date Taking? Authorizing Provider  feeding supplement, GLUCERNA SHAKE, (GLUCERNA SHAKE) LIQD Take 237 mLs by mouth 2 (two) times daily between meals. 01/15/23  Yes [name], MD  insulin aspart (NOVOLOG) 100 UNIT/ML FlexPen For glucose 121 to 150 use 1 unit, for 151 to 200 use 2 units, for 201 to 250 use 3 units, for 251 to 300 use 5 units, for 301 to 350 use 7 units for 351 or greater use 9 units. Patient taking differently: Inject 1-18 Units into the skin in the morning, at noon, in the evening, and at bedtime. 10/07/20  Yes [name], [name], MD  insulin detemir (LEVEMIR) 100 UNIT/ML FlexPen Inject 15 Units into the skin daily. Patient taking differently: Inject 8 Units into the skin at bedtime. 10/07/20  Yes [name], [name], MD  pantoprazole (PROTONIX) 40 MG tablet Take 1 tablet (40 mg total) by mouth daily at 12 noon. 02/12/23  Yes [name], MD  folic acid (FOLVITE) 1 MG tablet Take 1 tablet (1 mg  total) by mouth daily. Patient not taking: Reported on 03/19/2023 01/15/23   [name], MD  metoCLOPramide (REGLAN) 5 MG tablet Take 1 tablet (5 mg total) by mouth every 8 (eight) hours as needed for nausea. Patient not taking: Reported on 03/19/2023 02/12/23   [name], MD  Multiple Vitamin (MULTIVITAMIN WITH MINERALS) TABS tablet Take 1 tablet by mouth daily. Patient not taking: Reported on 03/19/2023 01/15/23   [name], MD  potassium chloride (KLOR-CON M) 10 MEQ tablet Take 5 tablets (50 mEq total) by mouth once for 1 dose. 01/15/23 01/15/23  [name], MD  thiamine (VITAMIN B-1) 100 MG tablet Take 1 tablet (100 mg total) by mouth daily. Patient not taking: Reported on 03/19/2023 01/15/23   [name], MD    Social History   Socioeconomic History   Marital status: Single    Spouse name: Not on file   Number of children: Not on file   Years of education: Not on file   Highest education level: Not on file  Occupational History   Not on file  Tobacco Use   Smoking status: Every Day    Packs/day: 2.50    Years: 0.00    Additional pack years: 0.00    Total pack years: 0.00    Types: Cigarettes   Smokeless tobacco: Never  Vaping Use   Vaping Use: Never used  Substance and Sexual Activity   Alcohol use: Yes    Comment: daily  84 oz per day   Drug use: Yes    Types: Marijuana   Sexual activity: Not on file  Other Topics Concern   Not on file  Social History Narrative   Not on file   Social Determinants of Health   Financial Resource Strain: Not on file  Food Insecurity: No Food Insecurity (02/10/2023)   Hunger Vital Sign    Worried About Running Out of Food in the Last Year: Never true    Ran Out of Food in the Last Year: Never true  Transportation Needs: No Transportation Needs (02/10/2023)   PRAPARE - Administrator, Civil Service (Medical): No    Lack of Transportation (Non-Medical): No  Physical Activity: Not on file  Stress: Not on  file  Social Connections: Not on file  Intimate Partner Violence: Not At Risk (02/10/2023)   Humiliation, Afraid, Rape, and Kick questionnaire    Fear of Current or Ex-Partner: No    Emotionally Abused: No    Physically Abused: No    Sexually Abused: No     Family History  Problem Relation Age of Onset   Diabetes Maternal Grandfather     ROS:  Positive    Negative    All sytems reviewed and are negative  Cardiovascular:  chest pain/pressure  palpitations  SOB lying flat  DOE  pain in legs while walking  pain in legs at rest  pain in legs at night  non-healing ulcers  hx of DVT  swelling in legs  Pulmonary:  productive cough  asthma/wheezing  home O2  Neurologic:  weakness in  arms  legs  numbness in  arms  legs  hx of CVA  mini stroke difficulty speaking or slurred speech  temporary loss of vision in one eye  dizziness  Hematologic:  hx of cancer  bleeding problems  problems with blood clotting easily  Endocrine:    diabetes  thyroid disease  GI  vomiting blood  blood in stool  GU:  CKD/renal failure  HD--[]  M/W/F or  T/T/S  burning with urination  blood in urine  Psychiatric:  anxiety  depression  Musculoskeletal:  arthritis  joint pain  Integumentary:  rashes  ulcers  Constitutional:  fever  chills   Physical Examination  Vitals:   03/19/23 1700 03/19/23 1800  BP: 130/86 129/87  Pulse: 97 98  Resp: 20 18  Temp:    SpO2: 96% 97%   Body mass index is 16.8 kg/m.  General:  NAD Gait: Not observed HENT: WNL, normocephalic Pulmonary: normal non-labored breathing Cardiac: regular, without  Murmurs, rubs or gallops Abdomen:  soft, NT/ND Vascular Exam/Pulses: Bilateral radial pulses palpable Bilateral common femoral pulses palpable Bilateral DP PT pulses palpable Extremities: without ischemic changes Musculoskeletal: no muscle  wasting or atrophy  Neurologic: A&O X 3; Appropriate Affect ; SENSATION: normal; MOTOR FUNCTION:  moving all extremities equally. Speech is fluent/normal   CBC    Component Value Date/Time   WBC 9.0 03/19/2023 1010   RBC 4.13 (L) 03/19/2023 1010   HGB 11.1 (L) 03/19/2023 1010   HCT 36.0 (L) 03/19/2023 1010   PLT 367 03/19/2023 1010   MCV 87.2 03/19/2023 1010   MCH 26.9 03/19/2023 1010   MCHC 30.8 03/19/2023 1010   RDW 15.5 03/19/2023 1010   LYMPHSABS 2.1 03/19/2023 0913   MONOABS 0.9 03/19/2023 0913   EOSABS 0.0 03/19/2023 0913  BASOSABS 0.1 03/19/2023 0913    BMET    Component Value Date/Time   NA 127 (L) 03/19/2023 1449   K 3.5 03/19/2023 1449   CL 90 (L) 03/19/2023 1449   CO2 11 (L) 03/19/2023 1449   GLUCOSE 646 (HH) 03/19/2023 1449   BUN 26 (H) 03/19/2023 1449   CREATININE 1.91 (H) 03/19/2023 1449   CALCIUM 8.6 (L) 03/19/2023 1449   GFRNONAA 42 (L) 03/19/2023 1449    COAGS: No results found for: "INR", "PROTIME"   Non-Invasive Vascular Imaging:    CT abdomen pelvis from today reviewed with small nonocclusive thrombus in the descending thoracic and distal abdominal aorta adjacent to some atherosclerotic plaque   ASSESSMENT/PLAN: 52 y.o. male with hx type 1 DM and tobacco abuse admitted with DKA that vascular surgery has been consulted for thrombus in the descending thoracic and distal abdominal aorta.   These areas of non-occlusive thrombus in the descending thoracic and distal abdominal aorta are new compared to the CT scan in January 2024.  They do have an acute appearance.  They are associated with some atherosclerotic plaque in the aorta at each location.  Fortunately he has no signs of malperfusion with palpable femoral and pedal pulses.  I have recommended heparin after discussing with critical care.  Will need echocardiogram for further cardioembolic work-up.  Would repeat CTA chest abdomen pelvis in several days once he has had therapeutic  anticoagulation.  Cephus Shelling, MD [facility] Office: [phone]  Cephus Shelling

## 2023-03-19 NOTE — Progress Notes (Addendum)
eLink Physician-Brief Progress Note Patient Name: Tom Anderson DOB: 1971-01-19 MRN: [id number]   Date of Service  03/19/2023  HPI/Events of Note  52 year old with type 1 diabetes mellitus, tobacco use disorder close initially admitted with diabetic ketoacidosis with descending thoracic and distal abdominal aortic thrombus.  He is currently too drowsy after his final dose, aspirin is due.  eICU Interventions  One-time rectal suppository aspirin.   2039 -significant hypokalemia 3.2, creatinine 1.4, limited oral intake, ongoing insulin drip.  Will give 60 mEq IV, 40 mEq oral once patient can tolerate orals  2211 -patient is sharp nails, is somewhat encephalopathic and poorly responsive, seems like he scratched himself on the face, no evidence of fall.  There is roughly 2 cm laceration over the left eyebrow with minor bleeding.  Bedside team to place Steri-Strips over the area.  Cleaned with gauze.  Had large BM.  Will add PRN restraints.  Intervention Category Minor Interventions: Routine modifications to care plan (e.g. PRN medications for pain, fever)  Gerald Kuehl 03/19/2023, 7:58 PM

## 2023-03-19 NOTE — Inpatient Diabetes Management (Signed)
Inpatient Diabetes Program Recommendations  AACE/ADA: New Consensus Statement on Inpatient Glycemic Control (2015)  Target Ranges:  Prepandial:   less than 140 mg/dL      Peak postprandial:   less than 180 mg/dL (1-2 hours)      Critically ill patients:  140 - 180 mg/dL   Lab Results  Component Value Date   GLUCAP >600 (HH) 03/19/2023   HGBA1C 11.8 (H) 01/12/2023    Latest Reference Range & Units 03/19/23 09:13  Sodium 135 - 145 mmol/L 125 (L)  Potassium 3.5 - 5.1 mmol/L 4.8  Chloride 98 - 111 mmol/L 79 (L)  CO2 22 - 32 mmol/L <7 (L)  Glucose 70 - 99 mg/dL >1,610 (HH)  BUN 6 - 20 mg/dL 31 (H)  Creatinine 9.60 - 1.24 mg/dL 4.54 (H)  Calcium 8.9 - 10.3 mg/dL 9.1  Anion gap 5 - 15  NOT CALCULATED  Alkaline Phosphatase 38 - 126 U/L 299 (H)  Albumin 3.5 - 5.0 g/dL 4.2  AST 15 - 41 U/L 96 (H)  ALT 0 - 44 U/L 250 (H)  Total Protein 6.5 - 8.1 g/dL 7.2  Total Bilirubin 0.3 - 1.2 mg/dL 1.7 (H)  GFR, Estimated >60 mL/min 32 (L)  (HH): Data is critically high (L): Data is abnormally low (H): Data is abnormally high  History: Type 1 Diabetes, Schizophrenia   Home DM Meds: Novolog 1-18 units QID                              Levemir 8 units QHS Current orders for Inpatient glycemic control: IV insulin drip  Patient currently in ED in DKA. IV insulin infusing per endotool. DM coordinator spoke with patient and his mom during last admission on 02/10/23. See note.   Will follow during hospitalization.  Thank you, Billy Fischer. Malillany Kazlauskas, RN, MSN, CDE  Diabetes Coordinator Inpatient Glycemic Control Team Team Pager [phone] (8am-5pm) 03/19/2023 11:23 AM

## 2023-03-19 NOTE — ED Provider Triage Note (Signed)
Emergency Medicine Provider Triage Evaluation Note  Tom Anderson , a 52 y.o. male  was evaluated in triage.  Pt complains of abdominal pain, decreased appetite, high blood sugar.  Patient has history of diabetes and has been recently seen in the hospital for DKA.  He has been taking his insulin.  Denies nausea, vomiting, diarrhea.  He has been breathing fast as well, which began this morning.   Review of Systems  Positive: As above Negative: As above  Physical Exam  BP (!) 144/95 (BP Location: Left Arm)   Pulse (!) 121   Resp (!) 34   SpO2 94%  Gen:   Awake, appears uncomfortable Resp:  Tachypneic, no accessory muscle use MSK:   Moves extremities without difficulty  Other:  Ill-appearing  Medical Decision Making  Medically screening exam initiated at 9:18 AM.  Appropriate orders placed.  Tom Anderson was informed that the remainder of the evaluation will be completed by another provider, this initial triage assessment does not replace that evaluation, and the importance of remaining in the ED until their evaluation is complete.     Lenard Simmer, [place] 03/19/23 [phone]

## 2023-03-19 NOTE — Progress Notes (Signed)
Pharmacy Antibiotic Note  Tom Anderson is a 52 y.o. male admitted on 03/19/2023 with sepsis. Pt admitted with DKA and noted to have an AKI on admission. Pharmacy has been consulted for Cefepime and Vancomycin dosing.  WBC pending, Temp 92.8 HR 103, RR 18 SCr 1.5 (baseline Scr ~0.7-0.9)  Plan: Initiate Cefepime 2g IV q12h  Initiate loading dose of Vancomycin  IV x 1, followed by  Vancomycin  IV q24h (eAUC ~515)    > Goal AUC 400-550    > Check vancomycin levels at steady state  Metronidazole  IV q12h per MD Monitor renal function and adjust cefepime and vancomycin doses accordingly Monitor daily CBC, temp, SCr, and for clinical signs of improvement  F/u cultures and de-escalate antibiotics as able   Weight: 59 kg (130 lb)  Temp (24hrs), Avg:92.8 F (33.8 C), Min:92.8 F (33.8 C), Max:92.8 F (33.8 C)  Recent Labs  Lab 03/19/23 0913 03/19/23 0937  WBC 12.6*  --   CREATININE 2.44* 1.50*    Estimated Creatinine Clearance: 49.7 mL/min (A) (by C-G formula based on SCr of 1.5 mg/dL (H)).    Allergies  Allergen Reactions   Nitrous Oxide Nausea And Vomiting    Antimicrobials this admission: Cefepime 4/19 >>  Vancomycin 4/19 >>  Metronidazole 4/19 >>   Dose adjustments this admission: N/A  Microbiology results: 4/19 BCx: pending   Thank you for allowing pharmacy to be a part of this patient's care.  Wilburn Cornelia, PharmD, BCPS Clinical Pharmacist 03/19/2023 11:33 AM   Please refer to AMION for pharmacy phone number

## 2023-03-20 ENCOUNTER — Inpatient Hospital Stay (HOSPITAL_COMMUNITY): Payer: Medicaid Other

## 2023-03-20 DIAGNOSIS — I5020 Unspecified systolic (congestive) heart failure: Secondary | ICD-10-CM | POA: Diagnosis not present

## 2023-03-20 DIAGNOSIS — F330 Major depressive disorder, recurrent, mild: Secondary | ICD-10-CM | POA: Diagnosis not present

## 2023-03-20 DIAGNOSIS — I8290 Acute embolism and thrombosis of unspecified vein: Secondary | ICD-10-CM

## 2023-03-20 DIAGNOSIS — I5022 Chronic systolic (congestive) heart failure: Secondary | ICD-10-CM

## 2023-03-20 DIAGNOSIS — E1110 Type 2 diabetes mellitus with ketoacidosis without coma: Secondary | ICD-10-CM | POA: Diagnosis not present

## 2023-03-20 DIAGNOSIS — G9340 Encephalopathy, unspecified: Secondary | ICD-10-CM

## 2023-03-20 DIAGNOSIS — F200 Paranoid schizophrenia: Secondary | ICD-10-CM | POA: Diagnosis not present

## 2023-03-20 LAB — BASIC METABOLIC PANEL
Anion gap: 12 (ref 5–15)
Anion gap: 11 (ref 5–15)
Anion gap: 26 — ABNORMAL HIGH (ref 5–15)
BUN: 17 mg/dL (ref 6–20)
BUN: 14 mg/dL (ref 6–20)
BUN: 26 mg/dL — ABNORMAL HIGH (ref 6–20)
CO2: 24 mmol/L (ref 22–32)
CO2: 23 mmol/L (ref 22–32)
CO2: 11 mmol/L — ABNORMAL LOW (ref 22–32)
Calcium: 8.4 mg/dL — ABNORMAL LOW (ref 8.9–10.3)
Calcium: 8.2 mg/dL — ABNORMAL LOW (ref 8.9–10.3)
Chloride: 91 mmol/L — ABNORMAL LOW (ref 98–111)
Chloride: 96 mmol/L — ABNORMAL LOW (ref 98–111)
Chloride: 90 mmol/L — ABNORMAL LOW (ref 98–111)
Creatinine, Ser: 1.12 mg/dL (ref 0.61–1.24)
Creatinine, Ser: 0.99 mg/dL (ref 0.61–1.24)
Creatinine, Ser: 1.91 mg/dL — ABNORMAL HIGH (ref 0.61–1.24)
GFR, Estimated: >60 mL/min (ref >60)
GFR, Estimated: >60 mL/min (ref >60)
GFR, Estimated: 42 mL/min — ABNORMAL LOW (ref >60)
Glucose, Bld: 161 mg/dL — ABNORMAL HIGH (ref 70–99)
Glucose, Bld: 138 mg/dL — ABNORMAL HIGH (ref 70–99)
Glucose, Bld: 646 mg/dL (ref 70–99)
Potassium: 4.3 mmol/L (ref 3.5–5.1)
Potassium: 4 mmol/L (ref 3.5–5.1)
Potassium: 3.5 mmol/L (ref 3.5–5.1)
Sodium: 127 mmol/L — ABNORMAL LOW (ref 135–145)
Sodium: 130 mmol/L — ABNORMAL LOW (ref 135–145)

## 2023-03-20 LAB — GLUCOSE, CAPILLARY
Glucose-Capillary: 133 mg/dL — ABNORMAL HIGH (ref 70–99)
Glucose-Capillary: 148 mg/dL — ABNORMAL HIGH (ref 70–99)
Glucose-Capillary: 152 mg/dL — ABNORMAL HIGH (ref 70–99)
Glucose-Capillary: 157 mg/dL — ABNORMAL HIGH (ref 70–99)
Glucose-Capillary: 162 mg/dL — ABNORMAL HIGH (ref 70–99)
Glucose-Capillary: 169 mg/dL — ABNORMAL HIGH (ref 70–99)
Glucose-Capillary: 180 mg/dL — ABNORMAL HIGH (ref 70–99)
Glucose-Capillary: 180 mg/dL — ABNORMAL HIGH (ref 70–99)
Glucose-Capillary: 206 mg/dL — ABNORMAL HIGH (ref 70–99)
Glucose-Capillary: 211 mg/dL — ABNORMAL HIGH (ref 70–99)
Glucose-Capillary: 248 mg/dL — ABNORMAL HIGH (ref 70–99)
Glucose-Capillary: 43 mg/dL — CL (ref 70–99)
Glucose-Capillary: 45 mg/dL — ABNORMAL LOW (ref 70–99)
Glucose-Capillary: 71 mg/dL (ref 70–99)
Glucose-Capillary: 88 mg/dL (ref 70–99)

## 2023-03-20 LAB — ECHOCARDIOGRAM COMPLETE
AR max vel: 3.35 cm2
AV Area VTI: 3.35 cm2
AV Area mean vel: 3.13 cm2
AV Mean grad: 1 mmHg
AV Peak grad: 2 mmHg
Ao pk vel: 0.71 m/s
Area-P 1/2: 3.4 cm2
S' Lateral: 2.4 cm
Weight: 1873.03 oz

## 2023-03-20 LAB — CBC
HCT: 30.9 % — ABNORMAL LOW (ref 39.0–52.0)
Hemoglobin: 11.4 g/dL — ABNORMAL LOW (ref 13.0–17.0)
MCH: 33.8 pg (ref 26.0–34.0)
MCHC: 36.9 g/dL — ABNORMAL HIGH (ref 30.0–36.0)
MCV: 91.7 fL (ref 80.0–100.0)
Platelets: 295 10*3/uL (ref 150–400)
RBC: 3.37 MIL/uL — ABNORMAL LOW (ref 4.22–5.81)
RDW: 11.8 % (ref 11.5–15.5)
WBC: 9.9 10*3/uL (ref 4.0–10.5)
nRBC: 0 % (ref 0.0–0.2)

## 2023-03-20 LAB — HEPATIC FUNCTION PANEL
ALT: 153 U/L — ABNORMAL HIGH (ref 0–44)
AST: 61 U/L — ABNORMAL HIGH (ref 15–41)
Albumin: 2.8 g/dL — ABNORMAL LOW (ref 3.5–5.0)
Alkaline Phosphatase: 177 U/L — ABNORMAL HIGH (ref 38–126)
Bilirubin, Direct: <0.1 mg/dL (ref 0.0–0.2)
Total Bilirubin: 0.6 mg/dL (ref 0.3–1.2)
Total Protein: 4.8 g/dL — ABNORMAL LOW (ref 6.5–8.1)

## 2023-03-20 LAB — HEPARIN LEVEL (UNFRACTIONATED)
Heparin Unfractionated: 0.49 IU/mL (ref 0.30–0.70)
Heparin Unfractionated: 0.43 [IU]/mL (ref 0.30–0.70)

## 2023-03-20 LAB — CULTURE, BLOOD (ROUTINE X 2)

## 2023-03-20 LAB — BETA-HYDROXYBUTYRIC ACID: Beta-Hydroxybutyric Acid: 0.24 mmol/L (ref 0.05–0.27)

## 2023-03-20 LAB — MAGNESIUM: Magnesium: 1.9 mg/dL (ref 1.7–2.4)

## 2023-03-20 LAB — PHOSPHORUS: Phosphorus: 2.3 mg/dL — ABNORMAL LOW (ref 2.5–4.6)

## 2023-03-20 LAB — LACTIC ACID, PLASMA: Lactic Acid, Venous: 4.4 mmol/L (ref 0.5–1.9)

## 2023-03-20 MED ORDER — METOCLOPRAMIDE HCL 5 MG PO TABS: 5.0000 mg | ORAL_TABLET | Freq: Three times a day (TID) | ORAL | Status: DC | PRN

## 2023-03-20 MED ORDER — LACTATED RINGERS IV SOLN: INTRAVENOUS | Status: DC

## 2023-03-20 MED ORDER — HYDROCODONE-ACETAMINOPHEN 5-325 MG PO TABS
1.0000 | ORAL_TABLET | Freq: Four times a day (QID) | ORAL | Status: DC | PRN
  Administered 2023-03-20 – 2023-03-21 (×2): 1 via ORAL
  Filled 2023-03-20 (×2): qty 1

## 2023-03-20 MED ORDER — DEXTROSE IN LACTATED RINGERS 5 % IV SOLN: INTRAVENOUS | Status: DC

## 2023-03-20 MED ORDER — ACETAMINOPHEN 325 MG PO TABS
ORAL_TABLET | ORAL | Status: AC
  Filled 2023-03-20: qty 2

## 2023-03-20 MED ORDER — ARIPIPRAZOLE 10 MG PO TABS
10.0000 mg | ORAL_TABLET | Freq: Every day | ORAL | Status: DC
  Administered 2023-03-20 – 2023-03-23 (×4): 10 mg via ORAL
  Filled 2023-03-20 (×4): qty 1

## 2023-03-20 MED ORDER — INSULIN ASPART 100 UNIT/ML IJ SOLN
2.0000 [IU] | INTRAMUSCULAR | Status: DC
  Administered 2023-03-20 – 2023-03-21 (×3): 2 [IU] via SUBCUTANEOUS

## 2023-03-20 MED ORDER — INSULIN DETEMIR 100 UNIT/ML ~~LOC~~ SOLN
5.0000 [IU] | Freq: Two times a day (BID) | SUBCUTANEOUS | Status: DC
  Administered 2023-03-20 (×2): 5 [IU] via SUBCUTANEOUS
  Filled 2023-03-20 (×4): qty 0.05

## 2023-03-20 MED ORDER — ACETAMINOPHEN 325 MG PO TABS: 650.0000 mg | ORAL_TABLET | ORAL | Status: DC | PRN

## 2023-03-20 NOTE — Consult Note (Addendum)
Cardiology Consultation   Patient ID: Tom Anderson MRN: [id number]; DOB: 03/04/1971  Admit date: 03/19/2023 Date of Consult: 03/20/2023  PCP:  Montez Hageman, DO   [facility] Providers Cardiologist:  None  new  Patient Profile:   Tom Anderson is a 52 y.o. male with a hx of uncontrolled type 1 diabetes, asthma, schizophrenia paranoid type and depression, who is being seen 03/20/2023 for the evaluation of abnormal echo at the request of Dr. Vassie Loll.  History of Present Illness:   Tom Anderson has never been seen by cardiology before.  He was admitted 4/19 with severe DKA in the setting of noncompliance with his diabetic medications.  Initial blood glucose greater than 1200 with creatinine 2.44, alkaline phosphatase 299 total bilirubin 1.7, ALT 250, AST 96.  Beta hydroxybutyric acid greater than 8, CBC mildly abnormal, lactic acid greater than 9.0, urine glucose greater than 500, UDS positive for THC.  Because of his abdominal pain, he had a CT of his abdomen and pelvis.  He had small nonocclusive emboli in the lower thoracic and distal abdominal aorta, a new finding.  A chest CT and echo were ordered.  He was started on heparin.  His echo showed RV apical hypokinesis with mildly reduced right ventricular systolic function.  He had a McConnell sign in the RV.  Cardiology was asked to evaluate him.  He initially required restraints and was combative.  He was started on IV antibiotics, given IV fluid resuscitation as well as 1 g of calcium gluconate, insulin drip with D5 LR, heparin drip, IV potassium supplementation, sodium bicarb and as needed fentanyl and Ativan.  He is currently out of restraints.  He is able to respond to simple questions, but is a very poor historian.  Information was obtained from staff and chart notes.  He complains of trunk pain, and nausea.   Past Medical History:  Diagnosis Date   Asthma    Depression    Diabetes mellitus without complication  (HCC)    Schizophrenia (HCC)     Past Surgical History:  Procedure Laterality Date   HERNIA REPAIR       Home Medications:  Prior to Admission medications   Medication Sig Start Date End Date Taking? Authorizing Provider  feeding supplement, GLUCERNA SHAKE, (GLUCERNA SHAKE) LIQD Take 237 mLs by mouth 2 (two) times daily between meals. 01/15/23  Yes [name], MD  insulin aspart (NOVOLOG) 100 UNIT/ML FlexPen For glucose 121 to 150 use 1 unit, for 151 to 200 use 2 units, for 201 to 250 use 3 units, for 251 to 300 use 5 units, for 301 to 350 use 7 units for 351 or greater use 9 units. Patient taking differently: Inject 1-18 Units into the skin in the morning, at noon, in the evening, and at bedtime. 10/07/20  Yes [name], [name], MD  insulin detemir (LEVEMIR) 100 UNIT/ML FlexPen Inject 15 Units into the skin daily. Patient taking differently: Inject 8 Units into the skin at bedtime. 10/07/20  Yes [name], [name], MD  pantoprazole (PROTONIX) 40 MG tablet Take 1 tablet (40 mg total) by mouth daily at 12 noon. 02/12/23  Yes [name], MD  folic acid (FOLVITE) 1 MG tablet Take 1 tablet (1 mg total) by mouth daily. Patient not taking: Reported on 03/19/2023 01/15/23   [name], MD  metoCLOPramide (REGLAN) 5 MG tablet Take 1 tablet (5 mg total) by mouth every 8 (eight) hours as needed for nausea. Patient not taking:  Reported on 03/19/2023 02/12/23   [name], MD  Multiple Vitamin (MULTIVITAMIN WITH MINERALS) TABS tablet Take 1 tablet by mouth daily. Patient not taking: Reported on 03/19/2023 01/15/23   [name], MD  potassium chloride (KLOR-CON M) 10 MEQ tablet Take 5 tablets (50 mEq total) by mouth once for 1 dose. 01/15/23 01/15/23  [name], MD  thiamine (VITAMIN B-1) 100 MG tablet Take 1 tablet (100 mg total) by mouth daily. Patient not taking: Reported on 03/19/2023 01/15/23   [name], MD    Inpatient Medications: Scheduled Meds:   ARIPiprazole  10 mg Oral Daily   aspirin  81 mg Oral Daily   folic acid  1 mg Oral Daily   insulin aspart  2-6 Units Subcutaneous Q4H   insulin detemir  5 Units Subcutaneous Q12H   multivitamin with minerals  1 tablet Oral Daily   thiamine  100 mg Oral Daily   Continuous Infusions:  heparin 800 Units/hr (03/20/23 1200)   lactated ringers 75 mL/hr at 03/20/23 1200   PRN Meds: dextrose, docusate sodium, metoCLOPramide, ondansetron (ZOFRAN) IV, mouth rinse, polyethylene glycol  Allergies:    Allergies  Allergen Reactions   Nitrous Oxide Nausea And Vomiting    Social History:   Social History   Socioeconomic History   Marital status: Single    Spouse name: Not on file   Number of children: Not on file   Years of education: Not on file   Highest education level: Not on file  Occupational History   Not on file  Tobacco Use   Smoking status: Every Day    Packs/day: 2.50    Years: 0.00    Additional pack years: 0.00    Total pack years: 0.00    Types: Cigarettes   Smokeless tobacco: Never  Vaping Use   Vaping Use: Never used  Substance and Sexual Activity   Alcohol use: Yes    Comment: daily  84 oz per day   Drug use: Yes    Types: Marijuana   Sexual activity: Not on file  Other Topics Concern   Not on file  Social History Narrative   Not on file   Social Determinants of Health   Financial Resource Strain: Not on file  Food Insecurity: No Food Insecurity (02/10/2023)   Hunger Vital Sign    Worried About Running Out of Food in the Last Year: Never true    Ran Out of Food in the Last Year: Never true  Transportation Needs: No Transportation Needs (02/10/2023)   PRAPARE - Administrator, Civil Service (Medical): No    Lack of Transportation (Non-Medical): No  Physical Activity: Not on file  Stress: Not on file  Social Connections: Not on file  Intimate Partner Violence: Not At Risk (02/10/2023)   Humiliation, Afraid, Rape, and Kick questionnaire     Fear of Current or Ex-Partner: No    Emotionally Abused: No    Physically Abused: No    Sexually Abused: No    Family History:   Family History  Problem Relation Age of Onset   Diabetes Maternal Grandfather      ROS:  Please see the history of present illness.  All other ROS reviewed and negative.     Physical Exam/Data:   Vitals:   03/20/23 0900 03/20/23 1000 03/20/23 1100 03/20/23 1148  BP: 105/76 106/76 103/79   Pulse: 87 87 84   Resp: 17 18 17    Temp:  98.6 F (37 C)  TempSrc:    Axillary  SpO2: 96% 96% 96%   Weight:        Intake/Output Summary (Last 24 hours) at 03/20/2023 1205 Last data filed at 03/20/2023 1200 Gross per 24 hour  Intake 8975.93 ml  Output 2450 ml  Net 6525.93 ml      03/20/2023    5:00 AM 03/19/2023    2:00 PM 03/19/2023    9:09 AM  Last 3 Weights  Weight (lbs) 117 lb 1 oz 113 lb 12.1 oz 130 lb  Weight (kg) 53.1 kg 51.6 kg 58.968 kg     Body mass index is 17.29 kg/m.  General: Frail, poorly nourished male, appears mildly distressed. HEENT: normal Neck: no JVD Vascular: No carotid bruits; Distal pulses 2+ bilaterally Cardiac:  normal S1, S2; RRR; no murmur  Lungs:  clear to auscultation bilaterally, no wheezing, rhonchi or rales  Abd: soft, tender, no hepatomegaly  Ext: no edema Musculoskeletal:  No deformities, BUE and BLE strength not able to be tested Skin: warm and dry  Neuro:  CNs 2-12 intact, no focal abnormalities noted Psych: Anxious affect  EKG:  The EKG was personally reviewed and demonstrates:  ST w/ PACs, HR 122 Telemetry:  Telemetry was personally reviewed and demonstrates: Sinus tach on admission but now mostly sinus rhythm  Relevant CV Studies:  ECHO: 03/20/2023 Sonographer Comments: Image acquisition challenging due to patient body  habitus and Image acquisition challenging due to uncooperative patient.  Patient would not lay still; patient had restraints on  IMPRESSIONS   1. Left ventricular ejection fraction,  by estimation, is 45 to 50%. The  left ventricle has mildly decreased function. The left ventricle has no  regional wall motion abnormalities. There is mild concentric left  ventricular hypertrophy. Left ventricular diastolic parameters are consistent with Grade I diastolic dysfunction (impaired relaxation).   2. No LV apical thrombus.   3. RV apical hypokinesis. Right ventricular systolic function is mildly  reduced. The right ventricular size is normal.   4. RV Mconnell's Sign   5. A small pericardial effusion is present. The pericardial effusion is  anterior to the right ventricle.   6. The mitral valve is normal in structure. Mild mitral valve  regurgitation. No evidence of mitral stenosis.   7. The aortic valve is tricuspid. There is mild thickening of the aortic  valve. Aortic valve regurgitation is not visualized.   8. Echodensity seen in descending aorta-in clinical correlation to CT  consistent with thrombus.   9. The inferior vena cava is normal in size with greater than 50%  respiratory variability, suggesting right atrial pressure of 3 mmHg.   Comparison(s): No prior Echocardiogram.   Conclusion(s)/Recommendation(s): Consider CTPE if clinically indicated.  Discussed with primary team; caridology to consult.   Laboratory Data:  High Sensitivity Troponin:   Recent Labs  Lab 03/19/23 1844 03/19/23 1940  TROPONINIHS 38* 38*     Chemistry Recent Labs  Lab 03/19/23 1844 03/20/23 0046 03/20/23 0945  NA 128* 127* 130*  K 3.2* 4.3 4.0  CL 91* 91* 96*  CO2 18* 24 23  GLUCOSE 248* 161* 138*  BUN 21* 17 14  CREATININE 1.42* 1.12 0.99  CALCIUM 8.4* 8.4* 8.2*  MG 1.9 1.9  --   GFRNONAA >60 >60 >60  ANIONGAP 19* 12 11    Recent Labs  Lab 03/19/23 0913 03/20/23 0046  PROT 7.2 4.8*  ALBUMIN 4.2 2.8*  AST 96* 61*  ALT 250* 153*  ALKPHOS 299* 177*  BILITOT 1.7* 0.6   Lipids No results for input(s): "CHOL", "TRIG", "HDL", "LABVLDL", "LDLCALC", "CHOLHDL" in the  last 168 hours.  Hematology Recent Labs  Lab 03/19/23 0913 03/19/23 0937 03/19/23 0939 03/19/23 1010 03/20/23 0046  WBC 12.6*  --   --  9.0 9.9  RBC 3.90*  --   --  4.13* 3.37*  HGB 13.4   < > 16.0 11.1* 11.4*  HCT 46.0   < > 47.0 36.0* 30.9*  MCV 117.9*  --   --  87.2 91.7  MCH 34.4*  --   --  26.9 33.8  MCHC 29.1*  --   --  30.8 36.9*  RDW 12.9  --   --  15.5 11.8  PLT 448*  --   --  367 295   < > = values in this interval not displayed.   Thyroid No results for input(s): "TSH", "FREET4" in the last 168 hours.  BNPNo results for input(s): "BNP", "PROBNP" in the last 168 hours.  DDimer No results for input(s): "DDIMER" in the last 168 hours. Drugs of Abuse     Component Value Date/Time   LABOPIA NONE DETECTED 03/19/2023 1238   COCAINSCRNUR NONE DETECTED 03/19/2023 1238   LABBENZ NONE DETECTED 03/19/2023 1238   AMPHETMU NONE DETECTED 03/19/2023 1238   THCU POSITIVE (A) 03/19/2023 1238   LABBARB NONE DETECTED 03/19/2023 1238      Radiology/Studies:  ECHOCARDIOGRAM COMPLETE  Result Date: 03/20/2023    ECHOCARDIOGRAM REPORT   Patient Name:   Tom Anderson Date of Exam: 03/20/2023 Medical Rec #:  [id number]     Height:       69.0 in Accession #:    [id number]    Weight:       117.1 lb Date of Birth:  [birth year]     BSA:          1.645 m Patient Age:    51 years      BP:           105/76 mmHg Patient Gender: M             HR:           85 bpm. Exam Location:  Inpatient Procedure: 2D Echo, Cardiac Doppler and Color Doppler                                 MODIFIED REPORT:     This report was modified by Riley Lam MD on 03/20/2023 due to                           Discussed with primary team.  Indications:     Thrombus  History:         Patient has no prior history of Echocardiogram examinations.                  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  Sonographer:     Dondra Prader RVT RCS Referring Phys:  [id number] Beulah Gandy PAYNE Diagnosing Phys: Riley Lam MD   Sonographer Comments: Image acquisition challenging due to patient body habitus and Image acquisition challenging due to uncooperative patient. Patient would not lay still; patient had restraints on IMPRESSIONS  1. Left ventricular ejection fraction, by estimation, is 45 to 50%. The left ventricle has mildly decreased function. The left ventricle has no regional wall motion abnormalities.  There is mild concentric left ventricular hypertrophy. Left ventricular diastolic parameters are consistent with Grade I diastolic dysfunction (impaired relaxation).  2. No LV apical thrombus.  3. RV apical hypokinesis. Right ventricular systolic function is mildly reduced. The right ventricular size is normal.  4. RV Mconnell's Sign  5. A small pericardial effusion is present. The pericardial effusion is anterior to the right ventricle.  6. The mitral valve is normal in structure. Mild mitral valve regurgitation. No evidence of mitral stenosis.  7. The aortic valve is tricuspid. There is mild thickening of the aortic valve. Aortic valve regurgitation is not visualized.  8. Echodensity seen in descending aorta-in clinical correlation to CT consistent with thrombus.  9. The inferior vena cava is normal in size with greater than 50% respiratory variability, suggesting right atrial pressure of 3 mmHg. Comparison(s): No prior Echocardiogram. Conclusion(s)/Recommendation(s): Consider CTPE if clinically indicated. Discussed with primary team; caridology to consult. FINDINGS  Left Ventricle: Left ventricular ejection fraction, by estimation, is 45 to 50%. The left ventricle has mildly decreased function. The left ventricle has no regional wall motion abnormalities. The left ventricular internal cavity size was normal in size. There is mild concentric left ventricular hypertrophy. Left ventricular diastolic parameters are consistent with Grade I diastolic dysfunction (impaired relaxation).  LV Wall Scoring: The apical inferior segment is  hypokinetic. Right Ventricle: RV apical hypokinesis. The right ventricular size is normal. No increase in right ventricular wall thickness. Right ventricular systolic function is mildly reduced. Left Atrium: Left atrial size was normal in size. Right Atrium: Right atrial size was normal in size. Pericardium: A small pericardial effusion is present. The pericardial effusion is anterior to the right ventricle. Mitral Valve: The mitral valve is normal in structure. Mild mitral valve regurgitation. No evidence of mitral valve stenosis. Tricuspid Valve: The tricuspid valve is normal in structure. Tricuspid valve regurgitation is not demonstrated. No evidence of tricuspid stenosis. Aortic Valve: The aortic valve is tricuspid. There is mild thickening of the aortic valve. Aortic valve regurgitation is not visualized. Aortic valve mean gradient measures 1.0 mmHg. Aortic valve peak gradient measures 2.0 mmHg. Aortic valve area, by VTI  measures 3.35 cm. Pulmonic Valve: The pulmonic valve was normal in structure. Pulmonic valve regurgitation is not visualized. No evidence of pulmonic stenosis. Aorta: Echodensity seen in descending aorta-in clinical correlation to CT consistent with thrombus. The aortic root and ascending aorta are structurally normal, with no evidence of dilitation. Venous: The inferior vena cava is normal in size with greater than 50% respiratory variability, suggesting right atrial pressure of 3 mmHg. IAS/Shunts: No atrial level shunt detected by color flow Doppler.  LEFT VENTRICLE PLAX 2D LVIDd:         4.20 cm   Diastology LVIDs:         2.40 cm   LV e' medial:    7.94 cm/s LV PW:         1.00 cm   LV E/e' medial:  5.5 LV IVS:        1.10 cm   LV e' lateral:   11.30 cm/s LVOT diam:     2.20 cm   LV E/e' lateral: 3.9 LV SV:         37 LV SV Index:   22 LVOT Area:     3.80 cm  RIGHT VENTRICLE             IVC RV Basal diam:  2.60 cm     IVC diam: 1.70 cm RV  Mid diam:    2.20 cm RV S prime:     12.10 cm/s  TAPSE (M-mode): 1.8 cm LEFT ATRIUM           Index        RIGHT ATRIUM          Index LA diam:      2.80 cm 1.70 cm/m   RA Area:     8.34 cm LA Vol (A2C): 23.2 ml 14.10 ml/m  RA Volume:   17.53 ml 10.65 ml/m LA Vol (A4C): 18.0 ml 10.97 ml/m  AORTIC VALVE                    PULMONIC VALVE AV Area (Vmax):    3.35 cm     PV Vmax:       0.76 m/s AV Area (Vmean):   3.13 cm     PV Peak grad:  2.3 mmHg AV Area (VTI):     3.35 cm AV Vmax:           70.90 cm/s AV Vmean:          48.100 cm/s AV VTI:            0.110 m AV Peak Grad:      2.0 mmHg AV Mean Grad:      1.0 mmHg LVOT Vmax:         62.50 cm/s LVOT Vmean:        39.600 cm/s LVOT VTI:          0.097 m LVOT/AV VTI ratio: 0.88  AORTA Ao Root diam: 3.30 cm Ao Asc diam:  3.00 cm MITRAL VALVE MV Area (PHT): 3.40 cm    SHUNTS MV Decel Time: 223 msec    Systemic VTI:  0.10 m MV E velocity: 43.70 cm/s  Systemic Diam: 2.20 cm MV A velocity: 46.00 cm/s MV E/A ratio:  0.95 Riley Lam MD Electronically signed by Riley Lam MD Signature Date/Time: 03/20/2023/10:05:35 AM    Final (Updated)    CT ABDOMEN PELVIS W CONTRAST  Result Date: 03/19/2023 CLINICAL DATA:  Acute abdominal pain.  Diabetic ketoacidosis. EXAM: CT ABDOMEN AND PELVIS WITH CONTRAST TECHNIQUE: Multidetector CT imaging of the abdomen and pelvis was performed using the standard protocol following bolus administration of intravenous contrast. RADIATION DOSE REDUCTION: This exam was performed according to the departmental dose-optimization program which includes automated exposure control, adjustment of the mA and/or kV according to patient size and/or use of iterative reconstruction technique. CONTRAST:  75mL OMNIPAQUE IOHEXOL 350 MG/ML SOLN COMPARISON:  03/11/2023 FINDINGS: Lower Chest: No acute findings. Hepatobiliary: No hepatic masses identified. Gallbladder is unremarkable. No evidence of biliary ductal dilatation. Pancreas:  No mass or inflammatory changes. Spleen: Within normal  limits in size and appearance. Adrenals/Urinary Tract: No suspicious masses identified. No evidence of ureteral calculi or hydronephrosis. Unremarkable unopacified urinary bladder. Stomach/Bowel: No evidence of obstruction, inflammatory process or abnormal fluid collections. Vascular/Lymphatic: No pathologically enlarged lymph nodes. Aortic atherosclerotic calcification again noted. In addition, there are new small intraluminal filling defects in the lower thoracic aorta and distal abdominal aorta at the bifurcation, which were not seen on recent study and are consistent with small nonocclusive emboli. The iliac and femoral arteries are patent. Reproductive:  No mass or other significant abnormality. Other:  Mild ascites is new since previous study. Musculoskeletal:  No suspicious bone lesions identified. IMPRESSION: New mild ascites. No focal inflammatory process or abscess identified. New small nonocclusive emboli in lower thoracic and  distal abdominal aorta. Proximal source of emboli is not visualized on this exam. Consider further evaluation with chest CTA or echocardiogram. Aortic Atherosclerosis (ICD10-I70.0). Critical Value/emergent results were called by telephone at the time of interpretation on 03/19/2023 at 6:58 pm to provider Emilie Rutter , who verbally acknowledged these results. Electronically Signed   By: Danae Orleans M.D.   On: 03/19/2023 19:04   US Abdomen Limited RUQ (LIVER/GB)  Result Date: 03/19/2023 CLINICAL DATA:  Right upper quadrant pain EXAM: ULTRASOUND ABDOMEN LIMITED RIGHT UPPER QUADRANT COMPARISON:  None Available. FINDINGS: Gallbladder: No gallstones or wall thickening visualized. No sonographic Tom sign noted by sonographer. Common bile duct: Diameter: 3.1 mm Liver: No focal lesion identified. Increased hepatic parenchymal echogenicity. Portal vein is patent on color Doppler imaging with normal direction of blood flow towards the liver. Other: None. IMPRESSION: 1. No  cholelithiasis or sonographic evidence of acute cholecystitis. 2. Increased hepatic echogenicity as can be seen with hepatic steatosis. Electronically Signed   By: Elige Ko M.D.   On: 03/19/2023 12:16   DG Chest Portable 1 View  Result Date: 03/19/2023 CLINICAL DATA:  sob EXAM: PORTABLE CHEST 1 VIEW COMPARISON:  March 12, 24. FINDINGS: The heart size and mediastinal contours are within normal limits. Both lungs are clear. No visible pleural effusions or pneumothorax. No acute osseous abnormality. Remote left rib fracture. IMPRESSION: No active disease. Electronically Signed   By: Feliberto Harts M.D.   On: 03/19/2023 10:11     Assessment and Plan:   Abnormal echocardiogram with McConnell sign and decreased RV systolic function. -This is concerning for PE.  However, his creatinine increased a little bit after the previous study and he is being hydrated. -Hopefully, his creatinine will stabilize and we will be able to get a CT angiogram of the chest tomorrow. -He has been started on heparin because of the abdominal aorta emboli -He is not yet ready for oral anticoagulation from a general medical standpoint -Discuss additional evaluation with MD. -Otherwise per CCM and Consultants.   Risk Assessment/Risk Scores:      For questions or updates, please contact [facility] Please consult [email or web address] for contact info under    Signed, Theodore Demark, PA-C  03/20/2023 12:05 PM  Personally seen and examined. Agree with APP above with the following comments:  Briefly 52 yo M with a history uncontrolled Type 1 DM with Schizophrenia who had echo imaging for evaluation of descending aortic thrombus.  An echo was performed with RV apical hypokinesis.   Called to assist in patients care.  Patient notes that he has been feeling poorly since the very first hospitalization he has.  He denies CP, SOB, Palpitations.  He notes abdominal pain.  For other providers he has been combative; at  time of evaluation he is not.  Never had prior cardiac work up per patient.    Exam notable for abdominal guarding but regular rhythm.  No RV heave.  No distal perfusion defects. Rest as above.  EKG Sinus tachycardia with rare PAC Tele: Sinus tachycardia-> Sinus rhythm  Would recommend  - Wall motion dose not appear ischemia in nature.  Would rule out CTPE (Discussed with Dr. Vassie Loll, likely planned for tomorrow unless worsening creatinine).  Stress cardiomyopathy also no differential.  - continue heparin - no evidence of PFO or ASD; if PE is noted we may consider TEE peri-discharge to rule out communication. - If normal we will plan for repeat outpatient imaging and further work up based on findings.  Riley Lam, MD FASE [facility] Cardiologist [facility]  [address] [place], [address] [place], [place] [address] [phone]  5:09 PM

## 2023-03-20 NOTE — Progress Notes (Signed)
eLink Physician-Brief Progress Note Patient Name: Tom Anderson DOB: 22-Jul-1971 MRN: [id number]   Date of Service  03/20/2023  HPI/Events of Note  "I can't wait to go home so I can commit suicide."  Patient with a history of schizophrenia seen by psychiatry today because he has had ongoing medication nonadherence due to his underlying psychiatric disease.  Previously on Prozac and proxilin, willing to take oral Abilify  eICU Interventions  This is his first expression of suicidal thoughts while inpatient.  Although I do not believe he is an imminent right to himself or others, will initiate SI precautions until psychiatry can reevaluate the patient.   54 -had about of hypoglycemia.  Similar to dayshift, adjusted D5 LR to 100 cc an hour.  Intervention Category Minor Interventions: Clinical assessment - ordering diagnostic tests  Emberlyn Burlison 03/20/2023, 10:51 PM

## 2023-03-20 NOTE — Progress Notes (Signed)
[facility]  Subjective  - no complaints.   Objective 105/76 87 (!) 97.3 F (36.3 C) (Oral) 17 96%  Intake/Output Summary (Last 24 hours) at 03/20/2023 0937 Last data filed at 03/20/2023 0900 Gross per 24 hour  Intake 8669.78 ml  Output 2250 ml  Net 6419.78 ml    Bilateral femoral pulses palpable Bilateral DP PT pulses palpable  Laboratory Lab Results: Recent Labs    03/19/23 1010 03/20/23 0046  WBC 9.0 9.9  HGB 11.1* 11.4*  HCT 36.0* 30.9*  PLT 367 295   BMET Recent Labs    03/19/23 1844 03/20/23 0046  NA 128* 127*  K 3.2* 4.3  CL 91* 91*  CO2 18* 24  GLUCOSE 248* 161*  BUN 21* 17  CREATININE 1.42* 1.12  CALCIUM 8.4* 8.4*    COAG No results found for: "INR", "PROTIME" No results found for: "PTT"  Assessment/Planning:   52 y.o. male with hx type 1 DM and tobacco abuse admitted with DKA that vascular surgery has been consulted for thrombus in the descending thoracic and distal abdominal aorta.    These areas of non-occlusive thrombus in the descending thoracic and distal abdominal aorta are new compared to the CT scan in January 2024.  They do have an acute appearance.  They are associated with some atherosclerotic plaque in the aorta at each location.  Fortunately he has no signs of malperfusion with palpable femoral and pedal pulses.  I have recommended heparin after discussing with critical care.  Will need echocardiogram for further cardioembolic work-up.  Would repeat CTA chest abdomen pelvis in several days once he has had therapeutic anticoagulation - likely Monday.  No change in exam.  Cephus Shelling 03/20/2023 9:37 AM --

## 2023-03-20 NOTE — Progress Notes (Signed)
This RN entered patients room to assist with urinal at 2240. Upon providing assistance patient made statement, "I can't wait to get out of here so I can commit suicide". This RN concerned, told patient "you can't say those things, I understand being in the ICU is hard." Patient then stated " I forgot, that will get me 10 more days in here. Never mind, I don't want to commit suicide". Elink notified, this RN sitting in patients room until tech is available. Awaiting new orders.

## 2023-03-20 NOTE — Progress Notes (Signed)
ANTICOAGULATION CONSULT NOTE - Follow up  Pharmacy Consult for Heparin Indication:  aortic emboli  Allergies  Allergen Reactions   Nitrous Oxide Nausea And Vomiting    Patient Measurements: Weight: 53.1 kg (117 lb 1 oz) Heparin Dosing Weight: 51.6kg  Vital Signs: Temp: 97.3 F (36.3 C) (04/20 0825) Temp Source: Oral (04/20 0825) BP: 106/76 (04/20 1000) Pulse Rate: 87 (04/20 1000)  Labs: Recent Labs    03/19/23 0913 03/19/23 0937 03/19/23 0939 03/19/23 1010 03/19/23 1449 03/19/23 1844 03/19/23 1940 03/20/23 0046 03/20/23 0242 03/20/23 0945  HGB 13.4   < > 16.0 11.1*  --   --   --  11.4*  --   --   HCT 46.0   < > 47.0 36.0*  --   --   --  30.9*  --   --   PLT 448*  --   --  367  --   --   --  295  --   --   HEPARINUNFRC  --   --   --   --   --   --   --   --  0.49 0.43  CREATININE 2.44*   < >  --   --    < > 1.42*  --  1.12  --  0.99  TROPONINIHS  --   --   --   --   --  38* 38*  --   --   --    < > = values in this interval not displayed.    Estimated Creatinine Clearance: 67.8 mL/min (by C-G formula based on SCr of 0.99 mg/dL).   Medical History: Past Medical History:  Diagnosis Date   Asthma    Depression    Diabetes mellitus without complication (HCC)    Schizophrenia (HCC)     Medications:  Scheduled:   aspirin  81 mg Oral Daily   folic acid  1 mg Oral Daily   insulin aspart  2-6 Units Subcutaneous Q4H   insulin detemir  5 Units Subcutaneous Q12H   multivitamin with minerals  1 tablet Oral Daily   thiamine  100 mg Oral Daily    Assessment: 52 yo male presenting with sepsis and AKI. Noted to have epigastric pain. 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 No AC PTA. Received 1 dose of SQH ~1530 on 4/19.  Heparin level is therapeutic x2 on current rate.  No issues notes.  Continue and monitor daily levels.   Goal of Therapy:  Heparin level 0.3-0.7 units/ml Monitor platelets by anticoagulation  protocol: Yes   Plan:  Continue current heparin infusion rate of 800units/hr Daily heparin levels Continue to monitor CBC and for signs/symptoms of bleeding  Link Snuffer, PharmD, BCPS, BCCCP Clinical Pharmacist Please refer to [facility] for [facility] numbers 03/20/2023, 10:53 AM

## 2023-03-20 NOTE — Consult Note (Signed)
[facility] Face-to-Face Psychiatry Consult   Reason for Consult:''schizophrenia, not on meds , leading to poor compliance with insulin.'' Referring Physician:  Cyril Mourning, MD Patient Identification: Tom Anderson MRN:  [id number] Principal Diagnosis: DKA (diabetic ketoacidosis) Diagnosis:  Principal Problem:   DKA (diabetic ketoacidosis) Active Problems:   Schizophrenia   Major depressive disorder, recurrent episode, mild   Total Time spent with patient: 1 hour  Subjective:   Tom Anderson is a 52 y.o. male patient admitted with uncontrolled diabetes.  HPI:  52 y/o male with type 1 diabetes, Asthma, Nicotine dependence, THC abuse, Schizophrenia paranoid type and depression who was admitted due to uncontrolled diabetes in the settings of non-compliance with his diabetic medications. Psychiatric consult was initiated for psychiatric evaluation and medication management. Patient reports long history of mental illness, he was prescribed Prozac and Prolixin by his provider in [place] but he stopped taking his medications about 9 months ago, he felt he no longer needed medications. Today, patient denies psychosis, delusions, self harming thoughts but states that he has been feeling depressed due to his inability to get his sugar under control. He states that he has been self medicating by smoking Cannabis. He refused to get back on Prozac and Prolixin but agreed to take oral Abilify.  Past Psychiatric History: as above  Risk to Self:  denies Risk to Others:  denies Prior Inpatient Therapy:  in the past Prior Outpatient Therapy:  [facility]  Past Medical History:  Past Medical History:  Diagnosis Date   Asthma    Depression    Diabetes mellitus without complication (HCC)    Schizophrenia (HCC)     Past Surgical History:  Procedure Laterality Date   HERNIA REPAIR     Family History:  Family History  Problem Relation Age of Onset   Diabetes Maternal Grandfather    Family Psychiatric   History:   Social History:  Social History   Substance and Sexual Activity  Alcohol Use Yes   Comment: daily  84 oz per day     Social History   Substance and Sexual Activity  Drug Use Yes   Types: Marijuana    Social History   Socioeconomic History   Marital status: Single    Spouse name: Not on file   Number of children: Not on file   Years of education: Not on file   Highest education level: Not on file  Occupational History   Not on file  Tobacco Use   Smoking status: Every Day    Packs/day: 2.50    Years: 0.00    Additional pack years: 0.00    Total pack years: 0.00    Types: Cigarettes   Smokeless tobacco: Never  Vaping Use   Vaping Use: Never used  Substance and Sexual Activity   Alcohol use: Yes    Comment: daily  84 oz per day   Drug use: Yes    Types: Marijuana   Sexual activity: Not on file  Other Topics Concern   Not on file  Social History Narrative   Not on file   Social Determinants of Health   Financial Resource Strain: Not on file  Food Insecurity: No Food Insecurity (02/10/2023)   Hunger Vital Sign    Worried About Running Out of Food in the Last Year: Never true    Ran Out of Food in the Last Year: Never true  Transportation Needs: No Transportation Needs (02/10/2023)   PRAPARE - Administrator, Civil Service (Medical):  No    Lack of Transportation (Non-Medical): No  Physical Activity: Not on file  Stress: Not on file  Social Connections: Not on file   Additional Social History:    Allergies:   Allergies  Allergen Reactions   Nitrous Oxide Nausea And Vomiting    Labs:  Results for orders placed or performed during the hospital encounter of 03/19/23 (from the past 48 hour(s))  CBG monitoring, ED     Status: Abnormal   Collection Time: 03/19/23  9:07 AM  Result Value Ref Range   Glucose-Capillary >600 (HH) 70 - 99 mg/dL    Comment: Glucose reference range applies only to samples taken after fasting for at least 8  hours.  CBC with Differential     Status: Abnormal   Collection Time: 03/19/23  9:13 AM  Result Value Ref Range   WBC 12.6 (H) 4.0 - 10.5 K/uL   RBC 3.90 (L) 4.22 - 5.81 MIL/uL   Hemoglobin 13.4 13.0 - 17.0 g/dL   HCT 91.4 78.2 - 95.6 %   MCV 117.9 (H) 80.0 - 100.0 fL   MCH 34.4 (H) 26.0 - 34.0 pg   MCHC 29.1 (L) 30.0 - 36.0 g/dL   RDW 21.3 08.6 - 57.8 %   Platelets 448 (H) 150 - 400 K/uL   nRBC 0.0 0.0 - 0.2 %   Neutrophils Relative % 71 %   Neutro Abs 9.0 (H) 1.7 - 7.7 K/uL   Lymphocytes Relative 16 %   Lymphs Abs 2.1 0.7 - 4.0 K/uL   Monocytes Relative 7 %   Monocytes Absolute 0.9 0.1 - 1.0 K/uL   Eosinophils Relative 0 %   Eosinophils Absolute 0.0 0.0 - 0.5 K/uL   Basophils Relative 1 %   Basophils Absolute 0.1 0.0 - 0.1 K/uL   WBC Morphology Mild Left Shift (1-5% metas, occ myelo)    RBC Morphology MORPHOLOGY UNREMARKABLE    Smear Review PLATELET CLUMPS PRESENT    Immature Granulocytes 5 %   Abs Immature Granulocytes 0.57 (H) 0.00 - 0.07 K/uL    Comment: Performed at [facility] Lab, [address] [place], [place] [address]  Comprehensive metabolic panel     Status: Abnormal   Collection Time: 03/19/23  9:13 AM  Result Value Ref Range   Sodium 125 (L) 135 - 145 mmol/L   Potassium 4.8 3.5 - 5.1 mmol/L   Chloride 79 (L) 98 - 111 mmol/L   CO2 <7 (L) 22 - 32 mmol/L   Glucose, Bld >1,200 (HH) 70 - 99 mg/dL    Comment: CRITICAL RESULT CALLED TO, READ BACK BY AND VERIFIED WITH V VALDEZ RN AT 1038 [id number] BY D LONG Glucose reference range applies only to samples taken after fasting for at least 8 hours.    BUN 31 (H) 6 - 20 mg/dL   Creatinine, Ser 9.52 (H) 0.61 - 1.24 mg/dL   Calcium 9.1 8.9 - 84.1 mg/dL   Total Protein 7.2 6.5 - 8.1 g/dL   Albumin 4.2 3.5 - 5.0 g/dL   AST 96 (H) 15 - 41 U/L   ALT 250 (H) 0 - 44 U/L   Alkaline Phosphatase 299 (H) 38 - 126 U/L   Total Bilirubin 1.7 (H) 0.3 - 1.2 mg/dL   GFR, Estimated 32 (L) >60 mL/min    Comment: (NOTE) Calculated  using the CKD-EPI Creatinine Equation (2021)    Anion gap NOT CALCULATED 5 - 15    Comment: Performed at [facility], [address] [place], [place] [address]  Beta-hydroxybutyric acid     Status: Abnormal   Collection Time: 03/19/23  9:31 AM  Result Value Ref Range   Beta-Hydroxybutyric Acid >8.00 (H) 0.05 - 0.27 mmol/L    Comment: RESULTS CONFIRMED BY MANUAL DILUTION Performed at [facility], [address] [place], [place] [address]   I-stat chem 8, ED (not at [facility], [facility] or [facility])     Status: Abnormal   Collection Time: 03/19/23  9:37 AM  Result Value Ref Range   Sodium 120 (L) 135 - 145 mmol/L   Potassium 4.7 3.5 - 5.1 mmol/L   Chloride 90 (L) 98 - 111 mmol/L   BUN 34 (H) 6 - 20 mg/dL   Creatinine, Ser 2.84 (H) 0.61 - 1.24 mg/dL   Glucose, Bld >132 (HH) 70 - 99 mg/dL    Comment: Glucose reference range applies only to samples taken after fasting for at least 8 hours.   Calcium, Ion 1.09 (L) 1.15 - 1.40 mmol/L   TCO2 7 (L) 22 - 32 mmol/L   Hemoglobin 16.3 13.0 - 17.0 g/dL   HCT 44.0 10.2 - 72.5 %   Comment NOTIFIED PHYSICIAN   I-Stat venous blood gas, ([facility] ED, [facility], [facility])     Status: Abnormal   Collection Time: 03/19/23  9:39 AM  Result Value Ref Range   pH, Ven 6.899 (LL) 7.25 - 7.43   pCO2, Ven 20.2 (L) 44 - 60 mmHg   pO2, Ven 61 (H) 32 - 45 mmHg   Bicarbonate 4.0 (L) 20.0 - 28.0 mmol/L   TCO2 <5 (L) 22 - 32 mmol/L   O2 Saturation 71 %   Acid-base deficit 28.0 (H) 0.0 - 2.0 mmol/L   Sodium 119 (LL) 135 - 145 mmol/L   Potassium 4.7 3.5 - 5.1 mmol/L   Calcium, Ion 1.07 (L) 1.15 - 1.40 mmol/L   HCT 47.0 39.0 - 52.0 %   Hemoglobin 16.0 13.0 - 17.0 g/dL   Sample type VENOUS    Comment NOTIFIED PHYSICIAN   CBC     Status: Abnormal   Collection Time: 03/19/23 10:10 AM  Result Value Ref Range   WBC 9.0 4.0 - 10.5 K/uL   RBC 4.13 (L) 4.22 - 5.81 MIL/uL   Hemoglobin 11.1 (L) 13.0 - 17.0 g/dL    Comment: REPEATED TO VERIFY   HCT 36.0 (L) 39.0 - 52.0 %   MCV  87.2 80.0 - 100.0 fL    Comment: PREVIOUS GLUC GREATER THAN 1200   MCH 26.9 26.0 - 34.0 pg   MCHC 30.8 30.0 - 36.0 g/dL   RDW 36.6 44.0 - 34.7 %   Platelets 367 150 - 400 K/uL   nRBC 0.0 0.0 - 0.2 %    Comment: Performed at [facility], [address] [place], [place] [address]  CBG monitoring, ED     Status: Abnormal   Collection Time: 03/19/23 10:38 AM  Result Value Ref Range   Glucose-Capillary >600 (HH) 70 - 99 mg/dL    Comment: Glucose reference range applies only to samples taken after fasting for at least 8 hours.  Lactic acid, plasma     Status: Abnormal   Collection Time: 03/19/23 11:00 AM  Result Value Ref Range   Lactic Acid, Venous >9.0 (HH) 0.5 - 1.9 mmol/L    Comment: CRITICAL RESULT CALLED TO, READ BACK BY AND VERIFIED WITH A PUGH RN 03/19/2023 Viann Shove Performed at [facility], [address] [place], [place] [address]   CBG monitoring, ED     Status: Abnormal   Collection Time: 03/19/23 11:45 AM  Result Value Ref Range   Glucose-Capillary >600 (HH) 70 - 99 mg/dL    Comment: Glucose reference range applies only to samples taken after fasting for at least 8 hours.  CBG monitoring, ED     Status: Abnormal   Collection Time: 03/19/23 12:16 PM  Result Value Ref Range   Glucose-Capillary >600 (HH) 70 - 99 mg/dL    Comment: Glucose reference range applies only to samples taken after fasting for at least 8 hours.  Urinalysis, Routine w reflex microscopic -Urine, Clean Catch     Status: Abnormal   Collection Time: 03/19/23 12:37 PM  Result Value Ref Range   Color, Urine STRAW (A) YELLOW   APPearance CLEAR CLEAR   Specific Gravity, Urine 1.016 1.005 - 1.030   pH 5.0 5.0 - 8.0   Glucose, UA >=500 (A) NEGATIVE mg/dL   Hgb urine dipstick SMALL (A) NEGATIVE   Bilirubin Urine NEGATIVE NEGATIVE   Ketones, ur 80 (A) NEGATIVE mg/dL   Protein, ur 30 (A) NEGATIVE mg/dL   Nitrite NEGATIVE NEGATIVE   Leukocytes,Ua NEGATIVE NEGATIVE   RBC / HPF 0-5 0 - 5 RBC/hpf    WBC, UA 6-10 0 - 5 WBC/hpf   Bacteria, UA RARE (A) Tom Anderson SEEN   Squamous Epithelial / HPF 0-5 0 - 5 /HPF   Mucus PRESENT     Comment: Performed at [facility], [address], [place], [place] [address]  Rapid urine drug screen (hospital performed)     Status: Abnormal   Collection Time: 03/19/23 12:38 PM  Result Value Ref Range   Opiates Tom Anderson DETECTED Tom Anderson DETECTED   Cocaine Tom Anderson DETECTED Tom Anderson DETECTED   Benzodiazepines Tom Anderson DETECTED Tom Anderson DETECTED   Amphetamines Tom Anderson DETECTED Tom Anderson DETECTED   Tetrahydrocannabinol POSITIVE (A) Tom Anderson DETECTED   Barbiturates Tom Anderson DETECTED Tom Anderson DETECTED    Comment: (NOTE) DRUG SCREEN FOR MEDICAL PURPOSES ONLY.  IF CONFIRMATION IS NEEDED FOR ANY PURPOSE, NOTIFY LAB WITHIN 5 DAYS.  LOWEST DETECTABLE LIMITS FOR URINE DRUG SCREEN Drug Class                     Cutoff (ng/mL) Amphetamine and metabolites    1000 Barbiturate and metabolites    200 Benzodiazepine                 200 Opiates and metabolites        300 Cocaine and metabolites        300 THC                            50 Performed at [facility], [address], [place], [place] [address]   CBG monitoring, ED     Status: Abnormal   Collection Time: 03/19/23  1:11 PM  Result Value Ref Range   Glucose-Capillary >600 (HH) 70 - 99 mg/dL    Comment: Glucose reference range applies only to samples taken after fasting for at least 8 hours.  Glucose, capillary     Status: Abnormal   Collection Time: 03/19/23  2:03 PM  Result Value Ref Range   Glucose-Capillary >600 (HH) 70 - 99 mg/dL    Comment: Glucose reference range applies only to samples taken after fasting for at least 8 hours.  Lactic acid, plasma     Status: Abnormal  Collection Time: 03/19/23  2:34 PM  Result Value Ref Range   Lactic Acid, Venous 8.7 (HH) 0.5 - 1.9 mmol/L    Comment: CRITICAL VALUE NOTED. VALUE IS CONSISTENT WITH PREVIOUSLY REPORTED/CALLED VALUE ADD TIME 1501 Performed at [facility], [address], [place], [place] [address]   Ethanol     Status: Tom Anderson   Collection Time: 03/19/23  2:34 PM  Result Value Ref Range   Alcohol, Ethyl (B) <10 <10 mg/dL    Comment: (NOTE) Lowest detectable limit for serum alcohol is 10 mg/dL.  For medical purposes only. Performed at [facility] Lab, [address], [place], [place] [address]   Glucose, capillary     Status: Abnormal   Collection Time: 03/19/23  2:45 PM  Result Value Ref Range   Glucose-Capillary >600 (HH) 70 - 99 mg/dL    Comment: Glucose reference range applies only to samples taken after fasting for at least 8 hours.  Blood culture (routine x 2)     Status: Tom Anderson (Preliminary result)   Collection Time: 03/19/23  2:49 PM   Specimen: BLOOD  Result Value Ref Range   Specimen Description BLOOD BLOOD LEFT FOREARM    Special Requests      BOTTLES DRAWN AEROBIC AND ANAEROBIC Blood Culture results may not be optimal due to an excessive volume of blood received in culture bottles   Culture      NO GROWTH < 24 HOURS Performed at [facility], [address] [place], [place] [address]    Report Status PENDING   Blood culture (routine x 2)     Status: Tom Anderson (Preliminary result)   Collection Time: 03/19/23  2:49 PM   Specimen: BLOOD RIGHT HAND  Result Value Ref Range   Specimen Description BLOOD RIGHT HAND    Special Requests      BOTTLES DRAWN AEROBIC AND ANAEROBIC Blood Culture results may not be optimal due to an excessive volume of blood received in culture bottles   Culture      NO GROWTH < 24 HOURS Performed at [facility], [address], [place], [place] [address]    Report Status PENDING   Blood gas, venous     Status: Abnormal   Collection Time: 03/19/23  2:49 PM  Result Value Ref Range   pH, Ven 7.31 7.25 - 7.43   pCO2, Ven 25 (L) 44 - 60 mmHg   pO2, Ven 63 (H) 32 - 45 mmHg   Bicarbonate 12.6 (L) 20.0 - 28.0 mmol/L   Acid-base deficit 11.9 (H) 0.0 - 2.0 mmol/L   O2 Saturation 94.5 %   Patient  temperature 37.0    Drawn by 164     Comment: Performed at [facility], [address] [place], [place] [address]  Basic metabolic panel     Status: Abnormal   Collection Time: 03/19/23  2:49 PM  Result Value Ref Range   Sodium 127 (L) 135 - 145 mmol/L   Potassium 3.5 3.5 - 5.1 mmol/L   Chloride 90 (L) 98 - 111 mmol/L   CO2 11 (L) 22 - 32 mmol/L   Glucose, Bld 646 (HH) 70 - 99 mg/dL    Comment: CRITICAL RESULT CALLED TO, READ BACK BY AND VERIFIED WITH A PUGH RN 03/19/2023 1656 [id number] Glucose reference range applies only to samples taken after fasting for at least 8 hours.    BUN 26 (H) 6 - 20 mg/dL   Creatinine, Ser  1.91 (H) 0.61 - 1.24 mg/dL   Calcium 8.6 (L) 8.9 - 10.3 mg/dL   GFR, Estimated 42 (L) >60 mL/min    Comment: (NOTE) Calculated using the CKD-EPI Creatinine Equation (2021)    Anion gap 26 (H) 5 - 15    Comment: Performed at [facility], [address], [place], [place] [address]  Glucose, capillary     Status: Abnormal   Collection Time: 03/19/23  3:09 PM  Result Value Ref Range   Glucose-Capillary 575 (HH) 70 - 99 mg/dL    Comment: Glucose reference range applies only to samples taken after fasting for at least 8 hours.   Comment 1 Notify RN   MRSA Next Gen by PCR, Nasal     Status: Tom Anderson   Collection Time: 03/19/23  3:14 PM   Specimen: Nasal Mucosa; Nasal Swab  Result Value Ref Range   MRSA by PCR Next Gen NOT DETECTED NOT DETECTED    Comment: (NOTE) The GeneXpert MRSA Assay (FDA approved for NASAL specimens only), is one component of a comprehensive MRSA colonization surveillance program. It is not intended to diagnose MRSA infection nor to guide or monitor treatment for MRSA infections. Test performance is not FDA approved in patients less than 67 years old. Performed at [facility], [address], [place], [place] [address]   Glucose, capillary     Status: Abnormal   Collection Time: 03/19/23  3:37 PM  Result Value Ref Range    Glucose-Capillary 474 (H) 70 - 99 mg/dL    Comment: Glucose reference range applies only to samples taken after fasting for at least 8 hours.  Glucose, capillary     Status: Abnormal   Collection Time: 03/19/23  4:08 PM  Result Value Ref Range   Glucose-Capillary 419 (H) 70 - 99 mg/dL    Comment: Glucose reference range applies only to samples taken after fasting for at least 8 hours.  Glucose, capillary     Status: Abnormal   Collection Time: 03/19/23  4:41 PM  Result Value Ref Range   Glucose-Capillary 373 (H) 70 - 99 mg/dL    Comment: Glucose reference range applies only to samples taken after fasting for at least 8 hours.  Glucose, capillary     Status: Abnormal   Collection Time: 03/19/23  5:40 PM  Result Value Ref Range   Glucose-Capillary 289 (H) 70 - 99 mg/dL    Comment: Glucose reference range applies only to samples taken after fasting for at least 8 hours.  Glucose, capillary     Status: Abnormal   Collection Time: 03/19/23  6:41 PM  Result Value Ref Range   Glucose-Capillary 237 (H) 70 - 99 mg/dL    Comment: Glucose reference range applies only to samples taken after fasting for at least 8 hours.  Basic metabolic panel     Status: Abnormal   Collection Time: 03/19/23  6:44 PM  Result Value Ref Range   Sodium 128 (L) 135 - 145 mmol/L   Potassium 3.2 (L) 3.5 - 5.1 mmol/L   Chloride 91 (L) 98 - 111 mmol/L   CO2 18 (L) 22 - 32 mmol/L   Glucose, Bld 248 (H) 70 - 99 mg/dL    Comment: Glucose reference range applies only to samples taken after fasting for at least 8 hours.   BUN 21 (H) 6 - 20 mg/dL   Creatinine, Ser 8.29 (H) 0.61 - 1.24 mg/dL   Calcium 8.4 (L) 8.9 - 10.3 mg/dL  GFR, Estimated >60 >60 mL/min    Comment: (NOTE) Calculated using the CKD-EPI Creatinine Equation (2021)    Anion gap 19 (H) 5 - 15    Comment: Performed at [facility], [address], [place], [place] [address]  Lactic acid, plasma     Status: Abnormal   Collection Time: 03/19/23  6:44  PM  Result Value Ref Range   Lactic Acid, Venous >9.0 (HH) 0.5 - 1.9 mmol/L    Comment: CRITICAL VALUE NOTED. VALUE IS CONSISTENT WITH PREVIOUSLY REPORTED/CALLED VALUE Performed at [facility], [address], [place], [place] [address]   Lipase, blood     Status: Abnormal   Collection Time: 03/19/23  6:44 PM  Result Value Ref Range   Lipase 403 (H) 11 - 51 U/L    Comment: RESULTS CONFIRMED BY MANUAL DILUTION Performed at [facility] Lab, [address] [place], [place] [address]   Troponin I (High Sensitivity)     Status: Abnormal   Collection Time: 03/19/23  6:44 PM  Result Value Ref Range   Troponin I (High Sensitivity) 38 (H) <18 ng/L    Comment: (NOTE) Elevated high sensitivity troponin I (hsTnI) values and significant  changes across serial measurements may suggest ACS but many other  chronic and acute conditions are known to elevate hsTnI results.  Refer to the "Links" section for chest pain algorithms and additional  guidance. Performed at [facility], [address], [place], [place] [address]   Magnesium     Status: Tom Anderson   Collection Time: 03/19/23  6:44 PM  Result Value Ref Range   Magnesium 1.9 1.7 - 2.4 mg/dL    Comment: Performed at [facility], [address], [place], [place] [address]  Troponin I (High Sensitivity)     Status: Abnormal   Collection Time: 03/19/23  7:40 PM  Result Value Ref Range   Troponin I (High Sensitivity) 38 (H) <18 ng/L    Comment: (NOTE) Elevated high sensitivity troponin I (hsTnI) values and significant  changes across serial measurements may suggest ACS but many other  chronic and acute conditions are known to elevate hsTnI results.  Refer to the "Links" section for chest pain algorithms and additional  guidance. Performed at [facility], [address] [place], [place] [address]   Glucose, capillary     Status: Abnormal   Collection Time: 03/19/23  7:40 PM  Result Value Ref Range    Glucose-Capillary 225 (H) 70 - 99 mg/dL    Comment: Glucose reference range applies only to samples taken after fasting for at least 8 hours.  Glucose, capillary     Status: Abnormal   Collection Time: 03/19/23  8:42 PM  Result Value Ref Range   Glucose-Capillary 198 (H) 70 - 99 mg/dL    Comment: Glucose reference range applies only to samples taken after fasting for at least 8 hours.  Glucose, capillary     Status: Abnormal   Collection Time: 03/19/23  9:44 PM  Result Value Ref Range   Glucose-Capillary 183 (H) 70 - 99 mg/dL    Comment: Glucose reference range applies only to samples taken after fasting for at least 8 hours.  Glucose, capillary     Status: Abnormal   Collection Time: 03/19/23 10:48 PM  Result Value Ref Range   Glucose-Capillary 187 (H) 70 - 99 mg/dL    Comment: Glucose reference range applies only to samples taken after fasting for at  least 8 hours.  Glucose, capillary     Status: Abnormal   Collection Time: 03/19/23 11:50 PM  Result Value Ref Range   Glucose-Capillary 152 (H) 70 - 99 mg/dL    Comment: Glucose reference range applies only to samples taken after fasting for at least 8 hours.  Basic metabolic panel     Status: Abnormal   Collection Time: 03/20/23 12:46 AM  Result Value Ref Range   Sodium 127 (L) 135 - 145 mmol/L   Potassium 4.3 3.5 - 5.1 mmol/L   Chloride 91 (L) 98 - 111 mmol/L   CO2 24 22 - 32 mmol/L   Glucose, Bld 161 (H) 70 - 99 mg/dL    Comment: Glucose reference range applies only to samples taken after fasting for at least 8 hours.   BUN 17 6 - 20 mg/dL   Creatinine, Ser 1.61 0.61 - 1.24 mg/dL   Calcium 8.4 (L) 8.9 - 10.3 mg/dL   GFR, Estimated >09 >60 mL/min    Comment: (NOTE) Calculated using the CKD-EPI Creatinine Equation (2021)    Anion gap 12 5 - 15    Comment: Performed at [facility], [address], [place], [place] [address]  CBC     Status: Abnormal   Collection Time: 03/20/23 12:46 AM  Result Value Ref Range   WBC  9.9 4.0 - 10.5 K/uL   RBC 3.37 (L) 4.22 - 5.81 MIL/uL   Hemoglobin 11.4 (L) 13.0 - 17.0 g/dL   HCT 81.1 (L) 91.4 - 78.2 %   MCV 91.7 80.0 - 100.0 fL   MCH 33.8 26.0 - 34.0 pg   MCHC 36.9 (H) 30.0 - 36.0 g/dL   RDW 95.6 21.3 - 08.6 %   Platelets 295 150 - 400 K/uL   nRBC 0.0 0.0 - 0.2 %    Comment: Performed at [facility], [address] [place], [place] [address]  Beta-hydroxybutyric acid     Status: Tom Anderson   Collection Time: 03/20/23 12:46 AM  Result Value Ref Range   Beta-Hydroxybutyric Acid 0.24 0.05 - 0.27 mmol/L    Comment: Performed at [facility], [address], [place], [place] [address]  Lactic acid, plasma     Status: Abnormal   Collection Time: 03/20/23 12:46 AM  Result Value Ref Range   Lactic Acid, Venous 4.4 (HH) 0.5 - 1.9 mmol/L    Comment: CRITICAL VALUE NOTED. VALUE IS CONSISTENT WITH PREVIOUSLY REPORTED/CALLED VALUE Performed at [facility] Lab, [address], [place], [place] [address]   Hepatic function panel     Status: Abnormal   Collection Time: 03/20/23 12:46 AM  Result Value Ref Range   Total Protein 4.8 (L) 6.5 - 8.1 g/dL   Albumin 2.8 (L) 3.5 - 5.0 g/dL   AST 61 (H) 15 - 41 U/L   ALT 153 (H) 0 - 44 U/L   Alkaline Phosphatase 177 (H) 38 - 126 U/L   Total Bilirubin 0.6 0.3 - 1.2 mg/dL   Bilirubin, Direct <2.4 0.0 - 0.2 mg/dL   Indirect Bilirubin NOT CALCULATED 0.3 - 0.9 mg/dL    Comment: Performed at [facility], [address] [place], [place] [address]  Magnesium     Status: Tom Anderson   Collection Time: 03/20/23 12:46 AM  Result Value Ref Range   Magnesium 1.9 1.7 - 2.4 mg/dL    Comment: Performed at [facility], [address], [place], [place] [address]  Phosphorus  Status: Abnormal   Collection Time: 03/20/23 12:46 AM  Result Value Ref Range   Phosphorus 2.3 (L) 2.5 - 4.6 mg/dL    Comment: Performed at [facility], [address] [place], [place] [address]  Glucose, capillary     Status: Abnormal    Collection Time: 03/20/23 12:52 AM  Result Value Ref Range   Glucose-Capillary 180 (H) 70 - 99 mg/dL    Comment: Glucose reference range applies only to samples taken after fasting for at least 8 hours.  Glucose, capillary     Status: Abnormal   Collection Time: 03/20/23  1:56 AM  Result Value Ref Range   Glucose-Capillary 248 (H) 70 - 99 mg/dL    Comment: Glucose reference range applies only to samples taken after fasting for at least 8 hours.  Heparin level (unfractionated)     Status: Tom Anderson   Collection Time: 03/20/23  2:42 AM  Result Value Ref Range   Heparin Unfractionated 0.49 0.30 - 0.70 IU/mL    Comment: (NOTE) The clinical reportable range upper limit is being lowered to >1.10 to align with the FDA approved guidance for the current laboratory assay.  If heparin results are below expected values, and patient dosage has  been confirmed, suggest follow up testing of antithrombin III levels. Performed at [facility], [address] [place], [place] [address]   Glucose, capillary     Status: Abnormal   Collection Time: 03/20/23  2:52 AM  Result Value Ref Range   Glucose-Capillary 211 (H) 70 - 99 mg/dL    Comment: Glucose reference range applies only to samples taken after fasting for at least 8 hours.  Glucose, capillary     Status: Abnormal   Collection Time: 03/20/23  3:59 AM  Result Value Ref Range   Glucose-Capillary 169 (H) 70 - 99 mg/dL    Comment: Glucose reference range applies only to samples taken after fasting for at least 8 hours.  Glucose, capillary     Status: Abnormal   Collection Time: 03/20/23  4:58 AM  Result Value Ref Range   Glucose-Capillary 162 (H) 70 - 99 mg/dL    Comment: Glucose reference range applies only to samples taken after fasting for at least 8 hours.  Glucose, capillary     Status: Abnormal   Collection Time: 03/20/23  5:41 AM  Result Value Ref Range   Glucose-Capillary 157 (H) 70 - 99 mg/dL    Comment: Glucose reference range  applies only to samples taken after fasting for at least 8 hours.  Glucose, capillary     Status: Abnormal   Collection Time: 03/20/23  8:22 AM  Result Value Ref Range   Glucose-Capillary 148 (H) 70 - 99 mg/dL    Comment: Glucose reference range applies only to samples taken after fasting for at least 8 hours.  Basic metabolic panel     Status: Abnormal   Collection Time: 03/20/23  9:45 AM  Result Value Ref Range   Sodium 130 (L) 135 - 145 mmol/L   Potassium 4.0 3.5 - 5.1 mmol/L   Chloride 96 (L) 98 - 111 mmol/L   CO2 23 22 - 32 mmol/L   Glucose, Bld 138 (H) 70 - 99 mg/dL    Comment: Glucose reference range applies only to samples taken after fasting for at least 8 hours.   BUN 14 6 - 20 mg/dL   Creatinine, Ser 0.98 0.61 - 1.24 mg/dL   Calcium 8.2 (L) 8.9 - 10.3 mg/dL  GFR, Estimated >60 >60 mL/min    Comment: (NOTE) Calculated using the CKD-EPI Creatinine Equation (2021)    Anion gap 11 5 - 15    Comment: Performed at [facility], [address] [place], [place] [address]  Heparin level (unfractionated)     Status: Tom Anderson   Collection Time: 03/20/23  9:45 AM  Result Value Ref Range   Heparin Unfractionated 0.43 0.30 - 0.70 IU/mL    Comment: (NOTE) The clinical reportable range upper limit is being lowered to >1.10 to align with the FDA approved guidance for the current laboratory assay.  If heparin results are below expected values, and patient dosage has  been confirmed, suggest follow up testing of antithrombin III levels. Performed at [facility], [address] [place], [place] [address]     Current Facility-Administered Medications  Medication Dose Route Frequency Provider Last Rate Last Admin   aspirin chewable tablet 81 mg  81 mg Oral Daily Cyril Mourning V, MD   81 mg at 03/20/23 0934   dextrose 50 % solution 0-50 mL  0-50 mL Intravenous PRN Oretha Milch, MD       docusate sodium (COLACE) capsule 100 mg  100 mg Oral BID PRN Oretha Milch, MD        folic acid (FOLVITE) tablet 1 mg  1 mg Oral Daily Cyril Mourning V, MD   1 mg at 03/20/23 0934   heparin ADULT infusion 100 units/mL (25000 units/236mL)  800 Units/hr Intravenous Continuous Cyril Mourning V, MD 8 mL/hr at 03/20/23 1100 800 Units/hr at 03/20/23 1100   insulin aspart (novoLOG) injection 2-6 Units  2-6 Units Subcutaneous Q4H Oretha Milch, MD   2 Units at 03/20/23 0845   insulin detemir (LEVEMIR) injection 5 Units  5 Units Subcutaneous Q12H Oretha Milch, MD   5 Units at 03/20/23 0715   lactated ringers infusion   Intravenous Continuous Oretha Milch, MD 75 mL/hr at 03/20/23 1100 Infusion Verify at 03/20/23 1100   metoCLOPramide (REGLAN) tablet 5 mg  5 mg Oral Q8H PRN Oretha Milch, MD       multivitamin with minerals tablet 1 tablet  1 tablet Oral Daily Oretha Milch, MD   1 tablet at 03/20/23 0934   ondansetron (ZOFRAN) injection 4 mg  4 mg Intravenous Q6H PRN Oretha Milch, MD   4 mg at 03/19/23 1801   Oral care mouth rinse  15 mL Mouth Rinse PRN Oretha Milch, MD       polyethylene glycol (MIRALAX / GLYCOLAX) packet 17 g  17 g Oral Daily PRN Oretha Milch, MD       thiamine (VITAMIN B1) tablet 100 mg  100 mg Oral Daily Oretha Milch, MD   100 mg at 03/20/23 8295    Musculoskeletal: Strength & Muscle Tone: unable to access Gait & Station: unsteady Patient leans: N/A    Psychiatric Specialty Exam:  Presentation  General Appearance:  Fairly Groomed  Eye Contact: Good  Speech: Pressured  Speech Volume: Increased  Handedness: Right   Mood and Affect  Mood: Dysphoric  Affect: Constricted   Thought Process  Thought Processes: Linear  Descriptions of Associations:Intact  Orientation:Full (Time, Place and Person)  Thought Content:Logical  History of Schizophrenia/Schizoaffective disorder:No data recorded Duration of Psychotic Symptoms:No data recorded Hallucinations:Hallucinations: Tom Anderson  Ideas of Reference:Tom Anderson  Suicidal Thoughts:Suicidal  Thoughts: No  Homicidal Thoughts:Homicidal Thoughts: No   Sensorium  Memory: Immediate Fair; Recent Fair;  Remote Fair  Judgment: Fair  Insight: Shallow   Executive Functions  Concentration: Fair  Attention Span: Fair  Recall: [employer] of Knowledge: Fair  Language: Fair   Psychomotor Activity  Psychomotor Activity: Psychomotor Activity: Increased   Assets  Assets: Communication Skills; Social Support   Sleep  Sleep: Sleep: Fair   Physical Exam: Physical Exam Review of Systems  Psychiatric/Behavioral:  Positive for depression and substance abuse.    Blood pressure 103/79, pulse 84, temperature 98.6 F (37 C), temperature source Axillary, resp. rate 17, weight 53.1 kg, SpO2 96 %. Body mass index is 17.29 kg/m.  Treatment Plan Summary: 52 y/o male with history of Schizophrenia, paranoid type and depressed who was admitted for DKA due to non-compliant with his diabetic medications. He also reports non-compliant with mental health medications. He only agreed to be prescribed oral Abilify and refused depot injection at this time.  Recommendations: -Abilify 10 mg daily for Schizophrenia and depression -Refer patient to his psychiatric provider in [place] upon discharge  Disposition: No evidence of imminent risk to self or others at present.   Patient does not meet criteria for psychiatric inpatient admission. Supportive therapy provided about ongoing stressors. Psychiatric service signing out. Re-consult as needed  Thedore Mins, MD 03/20/2023 11:50 AM

## 2023-03-20 NOTE — Progress Notes (Signed)
ANTICOAGULATION CONSULT NOTE - Follow up  Pharmacy Consult for Heparin Indication:  aortic emboli  Allergies  Allergen Reactions   Nitrous Oxide Nausea And Vomiting    Patient Measurements: Weight: 51.6 kg (113 lb 12.1 oz) Heparin Dosing Weight: 51.6kg  Vital Signs: Temp: 97.7 F (36.5 C) (04/19 2352) Temp Source: Axillary (04/19 2352) BP: 110/65 (04/20 0100) Pulse Rate: 96 (04/20 0100)  Labs: Recent Labs    03/19/23 0913 03/19/23 0937 03/19/23 0939 03/19/23 1010 03/19/23 1449 03/19/23 1844 03/19/23 1940 03/20/23 0046 03/20/23 0242  HGB 13.4   < > 16.0 11.1*  --   --   --  11.4*  --   HCT 46.0   < > 47.0 36.0*  --   --   --  30.9*  --   PLT 448*  --   --  367  --   --   --  295  --   HEPARINUNFRC  --   --   --   --   --   --   --   --  0.49  CREATININE 2.44*   < >  --   --  1.91* 1.42*  --  1.12  --   TROPONINIHS  --   --   --   --   --  38* 38*  --   --    < > = values in this interval not displayed.     Estimated Creatinine Clearance: 58.2 mL/min (by C-G formula based on SCr of 1.12 mg/dL).   Medical History: Past Medical History:  Diagnosis Date   Asthma    Depression    Diabetes mellitus without complication (HCC)    Schizophrenia (HCC)     Medications:  Scheduled:   aspirin  81 mg Oral Daily   folic acid  1 mg Oral Daily   multivitamin with minerals  1 tablet Oral Daily   thiamine  100 mg Oral Daily    Assessment: 52 yo male presenting with sepsis and AKI. Noted to have epigastric pain. 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 No AC PTA. Received 1 dose of SQH ~1530 on 4/19.  Goal of Therapy:  Heparin level 0.3-0.7 units/ml Monitor platelets by anticoagulation protocol: Yes   4/20 AM update: Heparin level currently therapeutic at 0.49 with current rate of 800units/hr and following bolus of 2000units x 1 dose (given 4/19 ). Hgb 11.4, Plt 295, No noted signs of bleeding  Plan:  Continue  current heparin infusion rate of 800units/hr Recheck HL in 6 hours Continue to monitor CBC and for signs/symptoms of bleeding  Fredirick Lathe, Student Pharmacist

## 2023-03-20 NOTE — Progress Notes (Signed)
NAME:  Tom Anderson, MRN:  [id number], DOB:  [birth year], LOS: 1 ADMISSION DATE:  03/19/2023, CONSULTATION DATE:  03/20/23 REFERRING MD:  EDP, CHIEF COMPLAINT:  weakness, light-headed  History of Present Illness:  52 y.o. M with PMH significant for type 1 DM, schizophrenia, asthma, depression, tobacco use with two recent admissions for DKA is brought in by parents after he stopped taking his insulin several days ago. Mom says sometimes he does this without clear reason, seems to think he does not need it sometimes and she cannot reason with him.  He has not been eating and has been lethargic.  No fevers, chills, diarrhea, has had some vomiting.   Mom says he used to drink ETOH, but stopped about four months ago, occasionally smokes weed, no other drug use.    In the ED, initial glucose reading >1200, bicarb <7, anion gap not calculated, creatinine 2.4, AST 96, ALT 250, bili 1.7, beta hydroxy >8,  pH 6.89.    Given his encephalopathy and severity of DKA, PCCM consulted for admission.  He was started on an insulin drip and given 2L LR bolus, 3 amps bicarb and broad spectrum abx.  Pt was moaning but following commands and protecting his airway  Pertinent  Medical History   has a past medical history of Asthma, Depression, Diabetes mellitus without complication (HCC), and Schizophrenia (HCC).   Significant Hospital Events: Including procedures, antibiotic start and stop dates in addition to other pertinent events   4/19 admit with DKA 4/19 CT abdomen/pelvis -small nonocclusive emboli in the lower thoracic and distal abdominal aorta>> vascular consult, started IV heparin  Interim History / Subjective:   Critically ill, but improved Anion gap normalized, beta hydroxybutyrate decreased, given orders to transition off insulin at 6:30 AM  Objective   Blood pressure 105/80, pulse 87, temperature (!) 97.3 F (36.3 C), temperature source Oral, resp. rate 19, weight 53.1 kg, SpO2 94 %.         Intake/Output Summary (Last 24 hours) at 03/20/2023 0859 Last data filed at 03/20/2023 0800 Gross per 24 hour  Intake 8535.47 ml  Output 2150 ml  Net 6385.47 ml   Filed Weights   03/19/23 0909 03/19/23 1400 03/20/23 0500  Weight: 59 kg 51.6 kg 53.1 kg   General:  very thin, poorly nourished M with temporal wasting, restless in bed HEENT: MM pink/moist, sclera anicteric Neuro: Follows commands and able to hold conversation, interactive CV: s1s2 rrr, no m/r/g PULM: Clear breath sounds bilateral GI: soft, non-tender  Extremities: warm/dry, decreased muscle tone and bulk, no edema  Skin: no rashes or lesions  Labs show decrease sugars, anion gap of 12, beta hydroxy butyrate 1.24, lactate 4.4, BUN/creatinine 17/1.1, no leukocytosis  Resolved Hospital Problem list   Acute metabolic encephalopathy  Assessment & Plan:    Type 1 DM in acute DKA  -Transitioned off insulin drip with subcu insulin   -DC antibiotics, no evidence of infection  Aortic thrombus/embolus -Await echo for embolic source -Continue IV heparin, consider Lovenox instead -Repeat CTA chest/abdomen/pelvis in a few days per vascular  AKI , resolving -likely secondary to volume depletion, presenting creatinine 2.4, baseline normal  --continue LR @75cc /hr, monitor renal function and UOP, renally dose meds    Elevated transaminases  Hyperbilirubinemia , resolved -unclear etiology, possibly ETOH which mom says has recently stopped -mildly elevated, decreasing -CT abdomen does not show any evidence of biliary ductal dilatation, mild ascites    Protein calorie malnutrition  POA -Advance to diabetic diet -  thiamine, folic acid, MV     Hx Schizophrenia  - IP psych consult -Will get his mental health issues are leading to noncompliance     Best Practice (right click and "Reselect all SmartList Selections" daily)   Diet/type: Regular consistency (see orders) DVT prophylaxis: systemic heparin GI  prophylaxis: N/A Lines: N/A Foley:  N/A Code Status:  full code Last date of multidisciplinary goals of care discussion [pending, parents updated at the bedside ] Can transfer to floor and to [facility]  Labs   CBC: Recent Labs  Lab 03/19/23 0913 03/19/23 0937 03/19/23 0939 03/19/23 1010 03/20/23 0046  WBC 12.6*  --   --  9.0 9.9  NEUTROABS 9.0*  --   --   --   --   HGB 13.4 16.3 16.0 11.1* 11.4*  HCT 46.0 48.0 47.0 36.0* 30.9*  MCV 117.9*  --   --  87.2 91.7  PLT 448*  --   --  367 295     Basic Metabolic Panel: Recent Labs  Lab 03/19/23 0913 03/19/23 0937 03/19/23 0939 03/19/23 1449 03/19/23 1844 03/20/23 0046  NA 125* 120* 119* 127* 128* 127*  K 4.8 4.7 4.7 3.5 3.2* 4.3  CL 79* 90*  --  90* 91* 91*  CO2 <7*  --   --  11* 18* 24  GLUCOSE >1,200* >700*  --  646* 248* 161*  BUN 31* 34*  --  26* 21* 17  CREATININE 2.44* 1.50*  --  1.91* 1.42* 1.12  CALCIUM 9.1  --   --  8.6* 8.4* 8.4*  MG  --   --   --   --  1.9 1.9  PHOS  --   --   --   --   --  2.3*    GFR: Estimated Creatinine Clearance: 59.9 mL/min (by C-G formula based on SCr of 1.12 mg/dL). Recent Labs  Lab 03/19/23 0913 03/19/23 1010 03/19/23 1100 03/19/23 1434 03/19/23 1844 03/20/23 0046  WBC 12.6* 9.0  --   --   --  9.9  LATICACIDVEN  --   --  >9.0* 8.7* >9.0* 4.4*     Liver Function Tests: Recent Labs  Lab 03/19/23 0913 03/20/23 0046  AST 96* 61*  ALT 250* 153*  ALKPHOS 299* 177*  BILITOT 1.7* 0.6  PROT 7.2 4.8*  ALBUMIN 4.2 2.8*   Recent Labs  Lab 03/19/23 1844  LIPASE 403*   No results for input(s): "AMMONIA" in the last 168 hours.  ABG    Component Value Date/Time   PHART 7.485 (H) 10/04/2020 0210   PCO2ART 25.5 (L) 10/04/2020 0210   PO2ART 152 (H) 10/04/2020 0210   HCO3 12.6 (L) 03/19/2023 1449   TCO2 <5 (L) 03/19/2023 0939   ACIDBASEDEF 11.9 (H) 03/19/2023 1449   O2SAT 94.5 03/19/2023 1449     Coagulation Profile: No results for input(s): "INR", "PROTIME" in the last  168 hours.  Cardiac Enzymes: No results for input(s): "CKTOTAL", "CKMB", "CKMBINDEX", "TROPONINI" in the last 168 hours.  HbA1C: Hgb A1c MFr Bld  Date/Time Value Ref Range Status  01/12/2023 04:11 PM 11.8 (H) 4.8 - 5.6 % Final    Comment:    (NOTE) Pre diabetes:          5.7%-6.4%  Diabetes:              >6.4%  Glycemic control for   <7.0% adults with diabetes   10/06/2020 07:26 AM 13.8 (H) 4.8 - 5.6 % Final  Comment:    (NOTE) Pre diabetes:          5.7%-6.4%  Diabetes:              >6.4%  Glycemic control for   <7.0% adults with diabetes     CBG: Recent Labs  Lab 03/20/23 0252 03/20/23 0359 03/20/23 0458 03/20/23 0541 03/20/23 0822  GLUCAP 211* 169* 162* 157* 148*     Critical care time:  31 minutes    CRITICAL CARE Performed by: Comer Locket. Maryse Brierley   Critical care time was exclusive of separately billable procedures and treating other patients.  Critical care was necessary to treat or prevent imminent or life-threatening deterioration.  Critical care was time spent personally by me on the following activities: development of treatment plan with patient and/or surrogate as well as nursing, discussions with consultants, evaluation of patient's response to treatment, examination of patient, obtaining history from patient or surrogate, ordering and performing treatments and interventions, ordering and review of laboratory studies, ordering and review of radiographic studies, pulse oximetry and re-evaluation of patient's condition.   Cyril Mourning MD. Tonny Bollman. [facility] Pager : [phone]  If no response to pager , please call [phone] until 7 pm After 7:00 pm call Elink  [phone]   03/20/2023

## 2023-03-21 ENCOUNTER — Inpatient Hospital Stay (HOSPITAL_COMMUNITY): Payer: Medicaid Other

## 2023-03-21 DIAGNOSIS — I7410 Embolism and thrombosis of unspecified parts of aorta: Secondary | ICD-10-CM | POA: Diagnosis not present

## 2023-03-21 DIAGNOSIS — F209 Schizophrenia, unspecified: Secondary | ICD-10-CM | POA: Diagnosis not present

## 2023-03-21 DIAGNOSIS — F200 Paranoid schizophrenia: Secondary | ICD-10-CM | POA: Diagnosis not present

## 2023-03-21 DIAGNOSIS — I8290 Acute embolism and thrombosis of unspecified vein: Secondary | ICD-10-CM | POA: Diagnosis not present

## 2023-03-21 DIAGNOSIS — I5020 Unspecified systolic (congestive) heart failure: Secondary | ICD-10-CM | POA: Diagnosis not present

## 2023-03-21 DIAGNOSIS — E1010 Type 1 diabetes mellitus with ketoacidosis without coma: Secondary | ICD-10-CM

## 2023-03-21 DIAGNOSIS — F330 Major depressive disorder, recurrent, mild: Secondary | ICD-10-CM | POA: Diagnosis not present

## 2023-03-21 LAB — CBC WITH DIFFERENTIAL/PLATELET
Abs Immature Granulocytes: 0.02 10*3/uL (ref 0.00–0.07)
Basophils Absolute: 0 10*3/uL (ref 0.0–0.1)
Basophils Relative: 0 %
Eosinophils Absolute: 0.1 10*3/uL (ref 0.0–0.5)
Eosinophils Relative: 1 %
HCT: 30.3 % — ABNORMAL LOW (ref 39.0–52.0)
Hemoglobin: 10.7 g/dL — ABNORMAL LOW (ref 13.0–17.0)
Immature Granulocytes: 0 %
Lymphocytes Relative: 25 %
Lymphs Abs: 2.3 10*3/uL (ref 0.7–4.0)
MCH: 33.9 pg (ref 26.0–34.0)
MCHC: 35.3 g/dL (ref 30.0–36.0)
MCV: 95.9 fL (ref 80.0–100.0)
Monocytes Absolute: 0.4 10*3/uL (ref 0.1–1.0)
Monocytes Relative: 5 %
Neutro Abs: 6.4 10*3/uL (ref 1.7–7.7)
Neutrophils Relative %: 69 %
Platelets: 204 10*3/uL (ref 150–400)
RBC: 3.16 MIL/uL — ABNORMAL LOW (ref 4.22–5.81)
RDW: 12.6 % (ref 11.5–15.5)
WBC: 9.3 10*3/uL (ref 4.0–10.5)
nRBC: 0 % (ref 0.0–0.2)

## 2023-03-21 LAB — CULTURE, BLOOD (ROUTINE X 2)

## 2023-03-21 LAB — BASIC METABOLIC PANEL
Anion gap: 8 (ref 5–15)
Anion gap: 10 (ref 5–15)
BUN: 7 mg/dL (ref 6–20)
BUN: 6 mg/dL (ref 6–20)
CO2: 29 mmol/L (ref 22–32)
CO2: 25 mmol/L (ref 22–32)
Calcium: 8.1 mg/dL — ABNORMAL LOW (ref 8.9–10.3)
Calcium: 7.8 mg/dL — ABNORMAL LOW (ref 8.9–10.3)
Chloride: 95 mmol/L — ABNORMAL LOW (ref 98–111)
Chloride: 92 mmol/L — ABNORMAL LOW (ref 98–111)
Creatinine, Ser: 0.74 mg/dL (ref 0.61–1.24)
Creatinine, Ser: 0.79 mg/dL (ref 0.61–1.24)
GFR, Estimated: >60 mL/min (ref >60)
GFR, Estimated: >60 mL/min (ref >60)
Glucose, Bld: 62 mg/dL — ABNORMAL LOW (ref 70–99)
Glucose, Bld: 232 mg/dL — ABNORMAL HIGH (ref 70–99)
Potassium: 2.9 mmol/L — ABNORMAL LOW (ref 3.5–5.1)
Potassium: 3.8 mmol/L (ref 3.5–5.1)
Sodium: 132 mmol/L — ABNORMAL LOW (ref 135–145)
Sodium: 127 mmol/L — ABNORMAL LOW (ref 135–145)

## 2023-03-21 LAB — GLUCOSE, CAPILLARY
Glucose-Capillary: 187 mg/dL — ABNORMAL HIGH (ref 70–99)
Glucose-Capillary: 227 mg/dL — ABNORMAL HIGH (ref 70–99)
Glucose-Capillary: 236 mg/dL — ABNORMAL HIGH (ref 70–99)
Glucose-Capillary: 354 mg/dL — ABNORMAL HIGH (ref 70–99)
Glucose-Capillary: 396 mg/dL — ABNORMAL HIGH (ref 70–99)
Glucose-Capillary: 52 mg/dL — ABNORMAL LOW (ref 70–99)
Glucose-Capillary: 84 mg/dL (ref 70–99)

## 2023-03-21 LAB — HEPARIN LEVEL (UNFRACTIONATED): Heparin Unfractionated: 0.32 IU/mL (ref 0.30–0.70)

## 2023-03-21 LAB — PHOSPHORUS
Phosphorus: 1.3 mg/dL — ABNORMAL LOW (ref 2.5–4.6)
Phosphorus: 2.2 mg/dL — ABNORMAL LOW (ref 2.5–4.6)

## 2023-03-21 LAB — CALCIUM, IONIZED: Calcium, Ionized, Serum: 4.8 mg/dL (ref 4.5–5.6)

## 2023-03-21 LAB — MAGNESIUM: Magnesium: 1.9 mg/dL (ref 1.7–2.4)

## 2023-03-21 MED ORDER — POTASSIUM PHOSPHATES 15 MMOLE/5ML IV SOLN
30.0000 mmol | Freq: Once | INTRAVENOUS | Status: AC
  Administered 2023-03-22: 30 mmol via INTRAVENOUS
  Filled 2023-03-21: qty 10

## 2023-03-21 MED ORDER — LACTATED RINGERS IV SOLN: INTRAVENOUS | Status: DC

## 2023-03-21 MED ORDER — POTASSIUM CHLORIDE CRYS ER 20 MEQ PO TBCR
20.0000 meq | EXTENDED_RELEASE_TABLET | Freq: Once | ORAL | Status: AC
  Administered 2023-03-21: 20 meq via ORAL
  Filled 2023-03-21: qty 1

## 2023-03-21 MED ORDER — IOHEXOL 350 MG/ML SOLN
150.0000 mL | Freq: Once | INTRAVENOUS | Status: AC | PRN
  Administered 2023-03-21: 150 mL via INTRAVENOUS

## 2023-03-21 MED ORDER — POTASSIUM PHOSPHATES 15 MMOLE/5ML IV SOLN
45.0000 mmol | Freq: Once | INTRAVENOUS | Status: AC
  Administered 2023-03-21: 45 mmol via INTRAVENOUS
  Filled 2023-03-21: qty 15

## 2023-03-21 MED ORDER — MAGNESIUM SULFATE 2 GM/50ML IV SOLN
2.0000 g | Freq: Once | INTRAVENOUS | Status: AC
  Administered 2023-03-21: 2 g via INTRAVENOUS
  Filled 2023-03-21: qty 50

## 2023-03-21 MED ORDER — INSULIN ASPART 100 UNIT/ML IJ SOLN
0.0000 [IU] | INTRAMUSCULAR | Status: DC
  Administered 2023-03-21: 15 [IU] via SUBCUTANEOUS
  Administered 2023-03-22 (×2): 3 [IU] via SUBCUTANEOUS
  Administered 2023-03-22: 5 [IU] via SUBCUTANEOUS

## 2023-03-21 MED ORDER — INSULIN DETEMIR 100 UNIT/ML ~~LOC~~ SOLN
5.0000 [IU] | Freq: Every day | SUBCUTANEOUS | Status: DC
  Filled 2023-03-21: qty 0.05

## 2023-03-21 MED ORDER — RAMELTEON 8 MG PO TABS
8.0000 mg | ORAL_TABLET | Freq: Every day | ORAL | Status: DC
  Administered 2023-03-22 (×2): 8 mg via ORAL
  Filled 2023-03-21 (×2): qty 1

## 2023-03-21 NOTE — Progress Notes (Signed)
Progress Note  Patient Name: Tom Anderson Date of Encounter: 03/21/2023  Primary Cardiologist: New to [facility]  Subjective   Overnight had new suicidal ideation.  Saw Psych. K is low. Patient notes that he would still like to go home "to do something that would be illegal to tell me" No CP, SOB, Palpitations.  Inpatient Medications    Scheduled Meds:  ARIPiprazole  10 mg Oral Daily   aspirin  81 mg Oral Daily   folic acid  1 mg Oral Daily   insulin aspart  2-6 Units Subcutaneous Q4H   insulin detemir  5 Units Subcutaneous Q12H   multivitamin with minerals  1 tablet Oral Daily   thiamine  100 mg Oral Daily   Continuous Infusions:  dextrose 5% lactated ringers 100 mL/hr at 03/21/23 0511   heparin 800 Units/hr (03/21/23 0511)   potassium PHOSPHATE IVPB (in mmol) 64.4 mL/hr at 03/21/23 0511   PRN Meds: acetaminophen, dextrose, docusate sodium, HYDROcodone-acetaminophen, metoCLOPramide, ondansetron (ZOFRAN) IV, mouth rinse, polyethylene glycol   Vital Signs    Vitals:   03/21/23 0200 03/21/23 0300 03/21/23 0400 03/21/23 0500  BP: 114/84 (!) 132/96 115/82 106/81  Pulse: 86 92 83 87  Resp: 18 17 17 13   Temp:   (!) 97.4 F (36.3 C)   TempSrc:   Oral   SpO2: 100% 97% 100% 100%  Weight:    33.7 kg    Intake/Output Summary (Last 24 hours) at 03/21/2023 0713 Last data filed at 03/21/2023 1610 Gross per 24 hour  Intake 2435.53 ml  Output 2975 ml  Net -539.47 ml   Filed Weights   03/19/23 1400 03/20/23 0500 03/21/23 0500  Weight: 51.6 kg 53.1 kg 33.7 kg    Telemetry    SR - Personally Reviewed  ECG    No new - Personally Reviewed  Physical Exam   Gen: no distress, malnourished   Neck: No JVD Cardiac: No Rubs or Gallops, no murmur, RRR +2 radial pulses Respiratory: expiratory wheezes, normal effort, normal  respiratory rate GI: Soft, nontender, non-distended  MS: No  edema;  moves all extremities Integument: Skin feels warm Neuro:  At time of  evaluation, alert and oriented to person/place/time Psych: Still has SI   Labs    Chemistry Recent Labs  Lab 03/19/23 0913 03/19/23 0937 03/20/23 0046 03/20/23 0945 03/21/23 0032  NA 125*   < > 127* 130* 132*  K 4.8   < > 4.3 4.0 2.9*  CL 79*   < > 91* 96* 95*  CO2 <7*   < > 24 23 29   GLUCOSE >1,200*   < > 161* 138* 62*  BUN 31*   < > 17 14 7   CREATININE 2.44*   < > 1.12 0.99 0.74  CALCIUM 9.1   < > 8.4* 8.2* 8.1*  PROT 7.2  --  4.8*  --   --   ALBUMIN 4.2  --  2.8*  --   --   AST 96*  --  61*  --   --   ALT 250*  --  153*  --   --   ALKPHOS 299*  --  177*  --   --   BILITOT 1.7*  --  0.6  --   --   GFRNONAA 32*   < > >60 >60 >60  ANIONGAP NOT CALCULATED   < > 12 11 8    < > = values in this interval not displayed.     Hematology Recent  Labs  Lab 03/19/23 1010 03/20/23 0046 03/21/23 0032  WBC 9.0 9.9 9.3  RBC 4.13* 3.37* 3.16*  HGB 11.1* 11.4* 10.7*  HCT 36.0* 30.9* 30.3*  MCV 87.2 91.7 95.9  MCH 26.9 33.8 33.9  MCHC 30.8 36.9* 35.3  RDW 15.5 11.8 12.6  PLT 367 295 204    Cardiac EnzymesNo results for input(s): "TROPONINI" in the last 168 hours. No results for input(s): "TROPIPOC" in the last 168 hours.   BNPNo results for input(s): "BNP", "PROBNP" in the last 168 hours.   DDimer No results for input(s): "DDIMER" in the last 168 hours.   Radiology    ECHOCARDIOGRAM COMPLETE  Result Date: 03/20/2023    ECHOCARDIOGRAM REPORT   Patient Name:   FLEMON KELTY Date of Exam: 03/20/2023 Medical Rec #:  [id number]     Height:       69.0 in Accession #:    [id number]    Weight:       117.1 lb Date of Birth:  1971/06/11     BSA:          1.645 m Patient Age:    52 years      BP:           105/76 mmHg Patient Gender: M             HR:           85 bpm. Exam Location:  Inpatient Procedure: 2D Echo, Cardiac Doppler and Color Doppler                                 MODIFIED REPORT:     This report was modified by Riley Lam MD on 03/20/2023 due to                            Discussed with primary team.  Indications:     Thrombus  History:         Patient has no prior history of Echocardiogram examinations.                  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  Sonographer:     Dondra Prader RVT RCS Referring Phys:  [id number] Beulah Gandy PAYNE Diagnosing Phys: Riley Lam MD  Sonographer Comments: Image acquisition challenging due to patient body habitus and Image acquisition challenging due to uncooperative patient. Patient would not lay still; patient had restraints on IMPRESSIONS  1. Left ventricular ejection fraction, by estimation, is 45 to 50%. The left ventricle has mildly decreased function. The left ventricle has no regional wall motion abnormalities. There is mild concentric left ventricular hypertrophy. Left ventricular diastolic parameters are consistent with Grade I diastolic dysfunction (impaired relaxation).  2. No LV apical thrombus.  3. RV apical hypokinesis. Right ventricular systolic function is mildly reduced. The right ventricular size is normal.  4. RV Mconnell's Sign  5. A small pericardial effusion is present. The pericardial effusion is anterior to the right ventricle.  6. The mitral valve is normal in structure. Mild mitral valve regurgitation. No evidence of mitral stenosis.  7. The aortic valve is tricuspid. There is mild thickening of the aortic valve. Aortic valve regurgitation is not visualized.  8. Echodensity seen in descending aorta-in clinical correlation to CT consistent with thrombus.  9. The inferior vena cava is normal in size with greater than 50% respiratory variability, suggesting right  atrial pressure of 3 mmHg. Comparison(s): No prior Echocardiogram. Conclusion(s)/Recommendation(s): Consider CTPE if clinically indicated. Discussed with primary team; caridology to consult. FINDINGS  Left Ventricle: Left ventricular ejection fraction, by estimation, is 45 to 50%. The left ventricle has mildly decreased function. The left  ventricle has no regional wall motion abnormalities. The left ventricular internal cavity size was normal in size. There is mild concentric left ventricular hypertrophy. Left ventricular diastolic parameters are consistent with Grade I diastolic dysfunction (impaired relaxation).  LV Wall Scoring: The apical inferior segment is hypokinetic. Right Ventricle: RV apical hypokinesis. The right ventricular size is normal. No increase in right ventricular wall thickness. Right ventricular systolic function is mildly reduced. Left Atrium: Left atrial size was normal in size. Right Atrium: Right atrial size was normal in size. Pericardium: A small pericardial effusion is present. The pericardial effusion is anterior to the right ventricle. Mitral Valve: The mitral valve is normal in structure. Mild mitral valve regurgitation. No evidence of mitral valve stenosis. Tricuspid Valve: The tricuspid valve is normal in structure. Tricuspid valve regurgitation is not demonstrated. No evidence of tricuspid stenosis. Aortic Valve: The aortic valve is tricuspid. There is mild thickening of the aortic valve. Aortic valve regurgitation is not visualized. Aortic valve mean gradient measures 1.0 mmHg. Aortic valve peak gradient measures 2.0 mmHg. Aortic valve area, by VTI  measures 3.35 cm. Pulmonic Valve: The pulmonic valve was normal in structure. Pulmonic valve regurgitation is not visualized. No evidence of pulmonic stenosis. Aorta: Echodensity seen in descending aorta-in clinical correlation to CT consistent with thrombus. The aortic root and ascending aorta are structurally normal, with no evidence of dilitation. Venous: The inferior vena cava is normal in size with greater than 50% respiratory variability, suggesting right atrial pressure of 3 mmHg. IAS/Shunts: No atrial level shunt detected by color flow Doppler.  LEFT VENTRICLE PLAX 2D LVIDd:         4.20 cm   Diastology LVIDs:         2.40 cm   LV e' medial:    7.94 cm/s LV PW:          1.00 cm   LV E/e' medial:  5.5 LV IVS:        1.10 cm   LV e' lateral:   11.30 cm/s LVOT diam:     2.20 cm   LV E/e' lateral: 3.9 LV SV:         37 LV SV Index:   22 LVOT Area:     3.80 cm  RIGHT VENTRICLE             IVC RV Basal diam:  2.60 cm     IVC diam: 1.70 cm RV Mid diam:    2.20 cm RV S prime:     12.10 cm/s TAPSE (M-mode): 1.8 cm LEFT ATRIUM           Index        RIGHT ATRIUM          Index LA diam:      2.80 cm 1.70 cm/m   RA Area:     8.34 cm LA Vol (A2C): 23.2 ml 14.10 ml/m  RA Volume:   17.53 ml 10.65 ml/m LA Vol (A4C): 18.0 ml 10.97 ml/m  AORTIC VALVE                    PULMONIC VALVE AV Area (Vmax):    3.35 cm     PV Vmax:  0.76 m/s AV Area (Vmean):   3.13 cm     PV Peak grad:  2.3 mmHg AV Area (VTI):     3.35 cm AV Vmax:           70.90 cm/s AV Vmean:          48.100 cm/s AV VTI:            0.110 m AV Peak Grad:      2.0 mmHg AV Mean Grad:      1.0 mmHg LVOT Vmax:         62.50 cm/s LVOT Vmean:        39.600 cm/s LVOT VTI:          0.097 m LVOT/AV VTI ratio: 0.88  AORTA Ao Root diam: 3.30 cm Ao Asc diam:  3.00 cm MITRAL VALVE MV Area (PHT): 3.40 cm    SHUNTS MV Decel Time: 223 msec    Systemic VTI:  0.10 m MV E velocity: 43.70 cm/s  Systemic Diam: 2.20 cm MV A velocity: 46.00 cm/s MV E/A ratio:  0.95 Riley Lam MD Electronically signed by Riley Lam MD Signature Date/Time: 03/20/2023/10:05:35 AM    Final (Updated)    CT ABDOMEN PELVIS W CONTRAST  Result Date: 03/19/2023 CLINICAL DATA:  Acute abdominal pain.  Diabetic ketoacidosis. EXAM: CT ABDOMEN AND PELVIS WITH CONTRAST TECHNIQUE: Multidetector CT imaging of the abdomen and pelvis was performed using the standard protocol following bolus administration of intravenous contrast. RADIATION DOSE REDUCTION: This exam was performed according to the departmental dose-optimization program which includes automated exposure control, adjustment of the mA and/or kV according to patient size and/or use of  iterative reconstruction technique. CONTRAST:  75mL OMNIPAQUE IOHEXOL 350 MG/ML SOLN COMPARISON:  03/11/2023 FINDINGS: Lower Chest: No acute findings. Hepatobiliary: No hepatic masses identified. Gallbladder is unremarkable. No evidence of biliary ductal dilatation. Pancreas:  No mass or inflammatory changes. Spleen: Within normal limits in size and appearance. Adrenals/Urinary Tract: No suspicious masses identified. No evidence of ureteral calculi or hydronephrosis. Unremarkable unopacified urinary bladder. Stomach/Bowel: No evidence of obstruction, inflammatory process or abnormal fluid collections. Vascular/Lymphatic: No pathologically enlarged lymph nodes. Aortic atherosclerotic calcification again noted. In addition, there are new small intraluminal filling defects in the lower thoracic aorta and distal abdominal aorta at the bifurcation, which were not seen on recent study and are consistent with small nonocclusive emboli. The iliac and femoral arteries are patent. Reproductive:  No mass or other significant abnormality. Other:  Mild ascites is new since previous study. Musculoskeletal:  No suspicious bone lesions identified. IMPRESSION: New mild ascites. No focal inflammatory process or abscess identified. New small nonocclusive emboli in lower thoracic and distal abdominal aorta. Proximal source of emboli is not visualized on this exam. Consider further evaluation with chest CTA or echocardiogram. Aortic Atherosclerosis (ICD10-I70.0). Critical Value/emergent results were called by telephone at the time of interpretation on 03/19/2023 at 6:58 pm to provider Emilie Rutter , who verbally acknowledged these results. Electronically Signed   By: Danae Orleans M.D.   On: 03/19/2023 19:04   US Abdomen Limited RUQ (LIVER/GB)  Result Date: 03/19/2023 CLINICAL DATA:  Right upper quadrant pain EXAM: ULTRASOUND ABDOMEN LIMITED RIGHT UPPER QUADRANT COMPARISON:  None Available. FINDINGS: Gallbladder: No gallstones or  wall thickening visualized. No sonographic Murphy sign noted by sonographer. Common bile duct: Diameter: 3.1 mm Liver: No focal lesion identified. Increased hepatic parenchymal echogenicity. Portal vein is patent on color Doppler imaging with normal direction of blood flow towards the  liver. Other: None. IMPRESSION: 1. No cholelithiasis or sonographic evidence of acute cholecystitis. 2. Increased hepatic echogenicity as can be seen with hepatic steatosis. Electronically Signed   By: Elige Ko M.D.   On: 03/19/2023 12:16   DG Chest Portable 1 View  Result Date: 03/19/2023 CLINICAL DATA:  sob EXAM: PORTABLE CHEST 1 VIEW COMPARISON:  March 12, 24. FINDINGS: The heart size and mediastinal contours are within normal limits. Both lungs are clear. No visible pleural effusions or pneumothorax. No acute osseous abnormality. Remote left rib fracture. IMPRESSION: No active disease. Electronically Signed   By: Feliberto Harts M.D.   On: 03/19/2023 10:11    Cardiac Studies   Cardiac Studies & Procedures       ECHOCARDIOGRAM  ECHOCARDIOGRAM COMPLETE 03/20/2023  Narrative ECHOCARDIOGRAM REPORT    Patient Name:   ITAI BARBIAN Date of Exam: 03/20/2023 Medical Rec #:  [id number]     Height:       69.0 in Accession #:    [id number]    Weight:       117.1 lb Date of Birth:  [birth year]     BSA:          1.645 m Patient Age:    52 years      BP:           105/76 mmHg Patient Gender: M             HR:           85 bpm. Exam Location:  Inpatient  Procedure: 2D Echo, Cardiac Doppler and Color Doppler  MODIFIED REPORT: This report was modified by Riley Lam MD on 03/20/2023 due to Discussed with primary team. Indications:     Thrombus  History:         Patient has no prior history of Echocardiogram examinations. 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  Sonographer:     Dondra Prader RVT RCS Referring Phys:  [id number] Beulah Gandy PAYNE Diagnosing Phys: Riley Lam  MD   Sonographer Comments: Image acquisition challenging due to patient body habitus and Image acquisition challenging due to uncooperative patient. Patient would not lay still; patient had restraints on IMPRESSIONS   1. Left ventricular ejection fraction, by estimation, is 45 to 50%. The left ventricle has mildly decreased function. The left ventricle has no regional wall motion abnormalities. There is mild concentric left ventricular hypertrophy. Left ventricular diastolic parameters are consistent with Grade I diastolic dysfunction (impaired relaxation). 2. No LV apical thrombus. 3. RV apical hypokinesis. Right ventricular systolic function is mildly reduced. The right ventricular size is normal. 4. RV Mconnell's Sign 5. A small pericardial effusion is present. The pericardial effusion is anterior to the right ventricle. 6. The mitral valve is normal in structure. Mild mitral valve regurgitation. No evidence of mitral stenosis. 7. The aortic valve is tricuspid. There is mild thickening of the aortic valve. Aortic valve regurgitation is not visualized. 8. Echodensity seen in descending aorta-in clinical correlation to CT consistent with thrombus. 9. The inferior vena cava is normal in size with greater than 50% respiratory variability, suggesting right atrial pressure of 3 mmHg.  Comparison(s): No prior Echocardiogram.  Conclusion(s)/Recommendation(s): Consider CTPE if clinically indicated. Discussed with primary team; caridology to consult.  FINDINGS Left Ventricle: Left ventricular ejection fraction, by estimation, is 45 to 50%. The left ventricle has mildly decreased function. The left ventricle has no regional wall motion abnormalities. The left ventricular internal cavity size was normal in size. There is mild concentric  left ventricular hypertrophy. Left ventricular diastolic parameters are consistent with Grade I diastolic dysfunction (impaired relaxation).   LV Wall Scoring: The  apical inferior segment is hypokinetic.  Right Ventricle: RV apical hypokinesis. The right ventricular size is normal. No increase in right ventricular wall thickness. Right ventricular systolic function is mildly reduced.  Left Atrium: Left atrial size was normal in size.  Right Atrium: Right atrial size was normal in size.  Pericardium: A small pericardial effusion is present. The pericardial effusion is anterior to the right ventricle.  Mitral Valve: The mitral valve is normal in structure. Mild mitral valve regurgitation. No evidence of mitral valve stenosis.  Tricuspid Valve: The tricuspid valve is normal in structure. Tricuspid valve regurgitation is not demonstrated. No evidence of tricuspid stenosis.  Aortic Valve: The aortic valve is tricuspid. There is mild thickening of the aortic valve. Aortic valve regurgitation is not visualized. Aortic valve mean gradient measures 1.0 mmHg. Aortic valve peak gradient measures 2.0 mmHg. Aortic valve area, by VTI measures 3.35 cm.  Pulmonic Valve: The pulmonic valve was normal in structure. Pulmonic valve regurgitation is not visualized. No evidence of pulmonic stenosis.  Aorta: Echodensity seen in descending aorta-in clinical correlation to CT consistent with thrombus. The aortic root and ascending aorta are structurally normal, with no evidence of dilitation.  Venous: The inferior vena cava is normal in size with greater than 50% respiratory variability, suggesting right atrial pressure of 3 mmHg.  IAS/Shunts: No atrial level shunt detected by color flow Doppler.   LEFT VENTRICLE PLAX 2D LVIDd:         4.20 cm   Diastology LVIDs:         2.40 cm   LV e' medial:    7.94 cm/s LV PW:         1.00 cm   LV E/e' medial:  5.5 LV IVS:        1.10 cm   LV e' lateral:   11.30 cm/s LVOT diam:     2.20 cm   LV E/e' lateral: 3.9 LV SV:         37 LV SV Index:   22 LVOT Area:     3.80 cm   RIGHT VENTRICLE             IVC RV Basal diam:  2.60  cm     IVC diam: 1.70 cm RV Mid diam:    2.20 cm RV S prime:     12.10 cm/s TAPSE (M-mode): 1.8 cm  LEFT ATRIUM           Index        RIGHT ATRIUM          Index LA diam:      2.80 cm 1.70 cm/m   RA Area:     8.34 cm LA Vol (A2C): 23.2 ml 14.10 ml/m  RA Volume:   17.53 ml 10.65 ml/m LA Vol (A4C): 18.0 ml 10.97 ml/m AORTIC VALVE                    PULMONIC VALVE AV Area (Vmax):    3.35 cm     PV Vmax:       0.76 m/s AV Area (Vmean):   3.13 cm     PV Peak grad:  2.3 mmHg AV Area (VTI):     3.35 cm AV Vmax:           70.90 cm/s AV Vmean:  48.100 cm/s AV VTI:            0.110 m AV Peak Grad:      2.0 mmHg AV Mean Grad:      1.0 mmHg LVOT Vmax:         62.50 cm/s LVOT Vmean:        39.600 cm/s LVOT VTI:          0.097 m LVOT/AV VTI ratio: 0.88  AORTA Ao Root diam: 3.30 cm Ao Asc diam:  3.00 cm  MITRAL VALVE MV Area (PHT): 3.40 cm    SHUNTS MV Decel Time: 223 msec    Systemic VTI:  0.10 m MV E velocity: 43.70 cm/s  Systemic Diam: 2.20 cm MV A velocity: 46.00 cm/s MV E/A ratio:  0.95  Riley Lam MD Electronically signed by Riley Lam MD Signature Date/Time: 03/20/2023/10:05:35 AM    Final (Updated)              Patient Profile     52 y.o. 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' sign  Assessment & Plan    Abnormal RV apex, McConnel's Sign, and LV apical hypokinesis - DDX stress induced vs PE; he does have risk for early CAD - has aortic atherosclerosis, no CAC incidentally found - Planned for CTPE and CTA either today or tomorrow; AKI has resolved; he is on heparin for aortic thrombus, if this needs to be delayed until psych issues improved this is reasonable  - if CTPE positive, would plan for peri-discharge TEE - presently, planned for repeat imaging and outpatient ischemia work up based on this  Other considerations addressed by other teams: Protein calorie  malnutrition SI and Schizophrenia Hypokalemia DKA and Type 1 DM   For questions or updates, please contact [facility] Please consult [email or web address] for contact info under Cardiology/STEMI.      Riley Lam, MD FASE [facility] Cardiologist [facility]  [address] [place], [address] [place], [place] [address] [phone]  7:13 AM

## 2023-03-21 NOTE — Progress Notes (Signed)
[facility]  Subjective  - wants to leave the hospital.   Objective 116/82 88 97.6 F (36.4 C) (Axillary) 16 100%  Intake/Output Summary (Last 24 hours) at 03/21/2023 0957 Last data filed at 03/21/2023 0900 Gross per 24 hour  Intake 3102.75 ml  Output 3275 ml  Net -172.25 ml    Bilateral femoral pulses palpable Bilateral DP PT pulses palpable  Laboratory Lab Results: Recent Labs    03/20/23 0046 03/21/23 0032  WBC 9.9 9.3  HGB 11.4* 10.7*  HCT 30.9* 30.3*  PLT 295 204   BMET Recent Labs    03/20/23 0945 03/21/23 0032  NA 130* 132*  K 4.0 2.9*  CL 96* 95*  CO2 23 29  GLUCOSE 138* 62*  BUN 14 7  CREATININE 0.99 0.74  CALCIUM 8.2* 8.1*    COAG No results found for: "INR", "PROTIME" No results found for: "PTT"  Assessment/Planning:  52 y.o. male with hx type 1 DM and tobacco abuse admitted with DKA that vascular surgery was consulted on Friday night for thrombus in the descending thoracic and distal abdominal aorta.    These areas of non-occlusive thrombus in the descending thoracic and distal abdominal aorta are new compared to the CT scan in January 2024.  They do have an acute appearance.  They are associated with some atherosclerotic plaque in the aorta at each location.  Fortunately he has no signs of malperfusion with palpable femoral and pedal pulses.  I have recommended heparin after discussing with critical care and he has tolerated this through the weekend.  Echocardiogram no evidence of thrombus.  Getting repeat CTA chest abdomen pelvis today as patient is threatening to leave the hospital.  I discussed with him that he will require at least 3 months of anticoagulation given the acute appearance of this thrombus.  Will see if there is any interval change on the CT scan today.  Cephus Shelling 03/21/2023 9:57 AM --

## 2023-03-21 NOTE — Progress Notes (Signed)
ANTICOAGULATION CONSULT NOTE - Follow up  Pharmacy Consult for Heparin Indication:  aortic emboli  Allergies  Allergen Reactions   Nitrous Oxide Nausea And Vomiting    Patient Measurements: Weight: 54.3 kg (119 lb 11.4 oz) Heparin Dosing Weight: 54.3 kg  Vital Signs: Temp: 97.4 F (36.3 C) (04/21 0400) Temp Source: Oral (04/21 0400) BP: 125/92 (04/21 0600) Pulse Rate: 92 (04/21 0600)  Labs: Recent Labs    03/19/23 1010 03/19/23 1449 03/19/23 1844 03/19/23 1940 03/20/23 0046 03/20/23 0242 03/20/23 0945 03/21/23 0032  HGB 11.1*  --   --   --  11.4*  --   --  10.7*  HCT 36.0*  --   --   --  30.9*  --   --  30.3*  PLT 367  --   --   --  295  --   --  204  HEPARINUNFRC  --   --   --   --   --  0.49 0.43 0.32  CREATININE  --    < > 1.42*  --  1.12  --  0.99 0.74  TROPONINIHS  --   --  38* 38*  --   --   --   --    < > = values in this interval not displayed.    Estimated Creatinine Clearance: 53.2 mL/min (by C-G formula based on SCr of 0.74 mg/dL).   Medical History: Past Medical History:  Diagnosis Date   Asthma    Depression    Diabetes mellitus without complication (HCC)    Schizophrenia (HCC)     Medications:  Scheduled:   ARIPiprazole  10 mg Oral Daily   aspirin  81 mg Oral Daily   folic acid  1 mg Oral Daily   insulin aspart  2-6 Units Subcutaneous Q4H   insulin detemir  5 Units Subcutaneous Q12H   multivitamin with minerals  1 tablet Oral Daily   thiamine  100 mg Oral Daily    Assessment: 52 yo male presenting with sepsis and AKI. Noted to have epigastric pain. 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 No AC PTA. Received 1 dose of SQH ~1530 on 4/19.  Heparin level remains therapeutic but trending down to low end of goal. No issues notes.  Continue and monitor daily levels.   Goal of Therapy:  Heparin level 0.3-0.7 units/ml Monitor platelets by anticoagulation protocol: Yes   Plan:  Increase  heparin infusion rate to 850 units/hr Daily heparin levels Continue to monitor CBC and for signs/symptoms of bleeding  Link Snuffer, PharmD, BCPS, BCCCP Clinical Pharmacist Please refer to [facility] for [facility] Pharmacy numbers 03/21/2023, 7:17 AM

## 2023-03-21 NOTE — Progress Notes (Signed)
NAME:  Tom Anderson, MRN:  [id number], DOB:  09-09-1971, LOS: 2 ADMISSION DATE:  03/19/2023, CONSULTATION DATE:  03/21/23 REFERRING MD:  EDP, CHIEF COMPLAINT:  weakness, light-headed  History of Present Illness:  52 y.o. M with PMH significant for type 1 DM, schizophrenia, asthma, depression, tobacco use with two recent admissions for DKA is brought in by parents after he stopped taking his insulin several days ago. Mom says sometimes he does this without clear reason, seems to think he does not need it sometimes and she cannot reason with him.  He has not been eating and has been lethargic.  No fevers, chills, diarrhea, has had some vomiting.   Mom says he used to drink ETOH, but stopped about four months ago, occasionally smokes weed, no other drug use.    In the ED, initial glucose reading >1200, bicarb <7, anion gap not calculated, creatinine 2.4, AST 96, ALT 250, bili 1.7, beta hydroxy >8,  pH 6.89.    Given his encephalopathy and severity of DKA, PCCM consulted for admission.  He was started on an insulin drip and given 2L LR bolus, 3 amps bicarb and broad spectrum abx.  Pt was moaning but following commands and protecting his airway  Pertinent  Medical History   has a past medical history of Asthma, Depression, Diabetes mellitus without complication (HCC), and Schizophrenia (HCC).   Significant Hospital Events: Including procedures, antibiotic start and stop dates in addition to other pertinent events   4/19 admit with DKA 4/19 CT abdomen/pelvis -small nonocclusive emboli in the lower thoracic and distal abdominal aorta>> vascular consult, started IV heparin , 4/20 transition off insulin at 6:30 AM  , 4/20 echo with hypokinetic LV, McConnell sign RV cardiology consulted  Interim History / Subjective:   Hypoglycemic and D5LR started. Expressed "I cannot wait to go home so I can commit suicide " Suicide cautions initiated. Patient states that he said this in humor , he does not like  to be tied down in the hospital.  He expresses desire to go home.  States he will go home to his parents   Objective   Blood pressure 102/73, pulse 89, temperature (!) 97.4 F (36.3 C), temperature source Oral, resp. rate 20, weight 54.3 kg, SpO2 93 %.        Intake/Output Summary (Last 24 hours) at 03/21/2023 0804 Last data filed at 03/21/2023 7829 Gross per 24 hour  Intake 2964.48 ml  Output 2875 ml  Net 89.48 ml    Filed Weights   03/20/23 0500 03/21/23 0500 03/21/23 0800  Weight: 53.1 kg 33.7 kg 54.3 kg   General:  very thin, poorly nourished M with temporal wasting, restless in bed HEENT: MM pink/moist, sclera anicteric Neuro: Alert, interactive, nonfocal, anxious affect, pressured speech CV: s1s2 rrr, no m/r/g PULM: Clear breath sounds bilateral GI: soft, non-tender  Extremities: warm/dry, decreased muscle tone and bulk, no edema , good pulses Skin: no rashes or lesions  Labs show improving hyponatremia, potassium 2.9, low Phos 1.3, normal magnesium, no leukocytosis, stable anemia. 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 to  45%, RV apical hypokinesis --Continue IV heparin, consider Lovenox instead -Repeat CTA chest/abdomen/pelvis to follow on clots and evaluate for PE If PE present then may have to evaluate for PFO with TEE  AKI , resolved -likely secondary to volume depletion, presenting creatinine 2.4, baseline normal  -Hypokalemia and hypophosphatemia will be repleted   Elevated transaminases  Hyperbilirubinemia , resolved -unclear etiology, possibly ETOH which mom says has recently  stopped -mildly elevated, decreasing -CT abdomen does not show any evidence of biliary ductal dilatation, mild ascites    Protein calorie malnutrition , moderate POA -Advance to diabetic diet, but oral intake remains poor -thiamine, folic acid, MV   Hx Schizophrenia  - IP psych consult >> he was only agreeable to Abilify and not to Depo formulation. -Expressed suicidal ideas overnight and hence on suicide watch, however he states that he just said this in humor and does not have any suicidal intent this morning.  Will ask psych to reassess and also evaluate for capacity since he is expressing a desire to sign out AMA.  When I explained risks of untreated aortic thrombus to him including that of death, not able to gauge whether he is able to understand consequences     Best Practice (right click and "Reselect all SmartList Selections" daily)   Diet/type: Regular consistency (see orders) DVT prophylaxis: systemic heparin GI prophylaxis: N/A Lines: N/A Foley:  N/A Code Status:  full code Last date of multidisciplinary goals of care discussion [pending, parents updated at the bedside ] Can transfer to floor and to [facility]  Labs   CBC: Recent Labs  Lab 03/19/23 0913 03/19/23 0937 03/19/23 0939 03/19/23 1010 03/20/23 0046 03/21/23 0032  WBC 12.6*  --   --  9.0 9.9 9.3  NEUTROABS 9.0*  --   --   --   --  6.4  HGB 13.4 16.3 16.0 11.1* 11.4* 10.7*  HCT 46.0 48.0 47.0 36.0* 30.9* 30.3*  MCV 117.9*  --   --  87.2 91.7 95.9  PLT 448*  --   --  367 295 204     Basic Metabolic Panel: Recent Labs  Lab 03/19/23 1449 03/19/23 1844 03/20/23 0046 03/20/23 0945 03/21/23 0032  NA 127* 128* 127* 130* 132*  K 3.5 3.2* 4.3 4.0 2.9*  CL 90* 91* 91* 96* 95*  CO2 11* 18* GLUCOSE 646* 248* 161* 138* 62*  BUN 26* 21* CREATININE 1.91* 1.42* 1.12 0.99 0.74  CALCIUM 8.6* 8.4* 8.4* 8.2* 8.1*  MG  --  1.9 1.9  --  1.9  PHOS  --   --  2.3*  --  1.3*    GFR: Estimated  Creatinine Clearance: 85.8 mL/min (by C-G formula based on SCr of 0.74 mg/dL). Recent Labs  Lab 03/19/23 0913 03/19/23 1010 03/19/23 1100 03/19/23 1434 03/19/23 1844 03/20/23 0046 03/21/23 0032  WBC 12.6* 9.0  --   --   --  9.9 9.3  LATICACIDVEN  --   --  >9.0* 8.7* >9.0* 4.4*  --      Liver Function Tests: Recent Labs  Lab 03/19/23 0913 03/20/23 0046  AST 96* 61*  ALT 250* 153*  ALKPHOS 299* 177*  BILITOT 1.7* 0.6  PROT 7.2 4.8*  ALBUMIN 4.2 2.8*    Recent Labs  Lab 03/19/23 1844  LIPASE 403*    No results for input(s): "AMMONIA" in the last 168 hours.  ABG    Component Value Date/Time   PHART 7.485 (  H) 10/04/2020 0210   PCO2ART 25.5 (L) 10/04/2020 0210   PO2ART 152 (H) 10/04/2020 0210   HCO3 12.6 (L) 03/19/2023 1449   TCO2 <5 (L) 03/19/2023 0939   ACIDBASEDEF 11.9 (H) 03/19/2023 1449   O2SAT 94.5 03/19/2023 1449     Coagulation Profile: No results for input(s): "INR", "PROTIME" in the last 168 hours.  Cardiac Enzymes: No results for input(s): "CKTOTAL", "CKMB", "CKMBINDEX", "TROPONINI" in the last 168 hours.  HbA1C: Hgb A1c MFr Bld  Date/Time Value Ref Range Status  01/12/2023 04:11 PM 11.8 (H) 4.8 - 5.6 % Final    Comment:    (NOTE) Pre diabetes:          5.7%-6.4%  Diabetes:              >6.4%  Glycemic control for   <7.0% adults with diabetes   10/06/2020 07:26 AM 13.8 (H) 4.8 - 5.6 % Final    Comment:    (NOTE) Pre diabetes:          5.7%-6.4%  Diabetes:              >6.4%  Glycemic control for   <7.0% adults with diabetes     CBG: Recent Labs  Lab 03/20/23 1638 03/20/23 1923 03/20/23 2321 03/21/23 0321 03/21/23 0735  GLUCAP 206* 133* 71 52* 84     Critical care time:  32 minutes    CRITICAL CARE Performed by: Comer Locket. Kani Jobson   Critical care time was exclusive of separately billable procedures and treating other patients.  Critical care was necessary to treat or prevent imminent or life-threatening  deterioration.  Critical care was time spent personally by me on the following activities: development of treatment plan with patient and/or surrogate as well as nursing, discussions with consultants, evaluation of patient's response to treatment, examination of patient, obtaining history from patient or surrogate, ordering and performing treatments and interventions, ordering and review of laboratory studies, ordering and review of radiographic studies, pulse oximetry and re-evaluation of patient's condition.   Cyril Mourning MD. Tonny Bollman. [facility] Pager : [phone]  If no response to pager , please call [phone] until 7 pm After 7:00 pm call Elink  [phone]   03/21/2023

## 2023-03-21 NOTE — Consult Note (Signed)
[facility] Face-to-Face Psychiatry Consult   Reason for Consult:'' Expressed suicidal thoughts and want to sign out AMA-re-assess for Capacity.''  Referring Physician:  Cyril Mourning, MD Patient Identification: Tom Anderson MRN:  [id number] Principal Diagnosis: DKA (diabetic ketoacidosis) Diagnosis:  Principal Problem:   DKA (diabetic ketoacidosis) Active Problems:   Schizophrenia   Moderate cannabis use disorder   Major depressive disorder, recurrent episode, mild   Thrombus   HFrEF (heart failure with reduced ejection fraction)   Total Time spent with patient: 30 hour  Subjective: '' I want to be discharged home right now, I think I am fine to go home.''  Objective: Patient seen face to face in his room for Capacity assessment. He reportedly expressed suicidal thoughts last night: ''I can't wait to go home so I can commit suicide.'' Today, he states that he was just joking and does not have any suicidal thoughts. However, patient refused to participate in full capacity assessment, he got very angry and upset that we would not allow him to go home and smoke cigarette. Patient needs ongoing medical and mental health treatment but exercising poor insight and judgment. He has history of type 1 diabetes, Asthma, Nicotine dependence, THC abuse, Schizophrenia paranoid type, depression, poor impulse control, agitation, combining all these with expressing suicidal thoughts makes him a danger to himself. It is difficult to determine if he has capacity to make decision or not as he refused to cooperate with assessment.   Past Psychiatric History: as above  Risk to Self:  denies Risk to Others:  denies Prior Inpatient Therapy:  in the past Prior Outpatient Therapy:  [facility]  Past Medical History:  Past Medical History:  Diagnosis Date   Asthma    Depression    Diabetes mellitus without complication (HCC)    Schizophrenia (HCC)     Past Surgical History:  Procedure Laterality Date   HERNIA  REPAIR     Family History:  Family History  Problem Relation Age of Onset   Diabetes Maternal Grandfather    Family Psychiatric  History:   Social History:  Social History   Substance and Sexual Activity  Alcohol Use Yes   Comment: daily  84 oz per day     Social History   Substance and Sexual Activity  Drug Use Yes   Types: Marijuana    Social History   Socioeconomic History   Marital status: Single    Spouse name: Not on file   Number of children: Not on file   Years of education: Not on file   Highest education level: Not on file  Occupational History   Not on file  Tobacco Use   Smoking status: Every Day    Packs/day: 2.50    Years: 0.00    Additional pack years: 0.00    Total pack years: 0.00    Types: Cigarettes   Smokeless tobacco: Never  Vaping Use   Vaping Use: Never used  Substance and Sexual Activity   Alcohol use: Yes    Comment: daily  84 oz per day   Drug use: Yes    Types: Marijuana   Sexual activity: Not on file  Other Topics Concern   Not on file  Social History Narrative   Not on file   Social Determinants of Health   Financial Resource Strain: Not on file  Food Insecurity: No Food Insecurity (02/10/2023)   Hunger Vital Sign    Worried About Running Out of Food in the Last Year: Never true  Ran Out of Food in the Last Year: Never true  Transportation Needs: No Transportation Needs (02/10/2023)   PRAPARE - Administrator, Civil Service (Medical): No    Lack of Transportation (Non-Medical): No  Physical Activity: Not on file  Stress: Not on file  Social Connections: Not on file   Additional Social History:    Allergies:   Allergies  Allergen Reactions   Nitrous Oxide Nausea And Vomiting    Labs:  Results for orders placed or performed during the hospital encounter of 03/19/23 (from the past 48 hour(s))  CBG monitoring, ED     Status: Abnormal   Collection Time: 03/19/23  1:11 PM  Result Value Ref Range    Glucose-Capillary >600 (HH) 70 - 99 mg/dL    Comment: Glucose reference range applies only to samples taken after fasting for at least 8 hours.  Glucose, capillary     Status: Abnormal   Collection Time: 03/19/23  2:03 PM  Result Value Ref Range   Glucose-Capillary >600 (HH) 70 - 99 mg/dL    Comment: Glucose reference range applies only to samples taken after fasting for at least 8 hours.  Lactic acid, plasma     Status: Abnormal   Collection Time: 03/19/23  2:34 PM  Result Value Ref Range   Lactic Acid, Venous 8.7 (HH) 0.5 - 1.9 mmol/L    Comment: CRITICAL VALUE NOTED. VALUE IS CONSISTENT WITH PREVIOUSLY REPORTED/CALLED VALUE ADD TIME 1501 Performed at [facility], [address] [place], [place] [address]   calcium, ionized     Status: None   Collection Time: 03/19/23  2:34 PM  Result Value Ref Range   Calcium, Ionized, Serum 4.8 4.5 - 5.6 mg/dL    Comment: (NOTE) Performed At: [facility] [address] [place], [place] [id number] Jolene Schimke MD MV:[id number]   Ethanol     Status: None   Collection Time: 03/19/23  2:34 PM  Result Value Ref Range   Alcohol, Ethyl (B) <10 <10 mg/dL    Comment: (NOTE) Lowest detectable limit for serum alcohol is 10 mg/dL.  For medical purposes only. Performed at [facility], [address] [place], [place] [address]   Glucose, capillary     Status: Abnormal   Collection Time: 03/19/23  2:45 PM  Result Value Ref Range   Glucose-Capillary >600 (HH) 70 - 99 mg/dL    Comment: Glucose reference range applies only to samples taken after fasting for at least 8 hours.  Blood culture (routine x 2)     Status: None (Preliminary result)   Collection Time: 03/19/23  2:49 PM   Specimen: BLOOD  Result Value Ref Range   Specimen Description BLOOD BLOOD LEFT FOREARM    Special Requests      BOTTLES DRAWN AEROBIC AND ANAEROBIC Blood Culture results may not be optimal due to an excessive volume of blood received in culture  bottles   Culture      NO GROWTH 2 DAYS Performed at [facility], [address] [place], [place] [address]    Report Status PENDING   Blood culture (routine x 2)     Status: None (Preliminary result)   Collection Time: 03/19/23  2:49 PM   Specimen: BLOOD RIGHT HAND  Result Value Ref Range   Specimen Description BLOOD RIGHT HAND    Special Requests      BOTTLES DRAWN AEROBIC AND ANAEROBIC Blood Culture results may not be optimal due to  an excessive volume of blood received in culture bottles   Culture      NO GROWTH 2 DAYS Performed at [facility], [address], [place], [place] [address]    Report Status PENDING   Blood gas, venous     Status: Abnormal   Collection Time: 03/19/23  2:49 PM  Result Value Ref Range   pH, Ven 7.31 7.25 - 7.43   pCO2, Ven 25 (L) 44 - 60 mmHg   pO2, Ven 63 (H) 32 - 45 mmHg   Bicarbonate 12.6 (L) 20.0 - 28.0 mmol/L   Acid-base deficit 11.9 (H) 0.0 - 2.0 mmol/L   O2 Saturation 94.5 %   Patient temperature 37.0    Drawn by 164     Comment: Performed at [facility], [address] [place], [place] [address]  Basic metabolic panel     Status: Abnormal   Collection Time: 03/19/23  2:49 PM  Result Value Ref Range   Sodium 127 (L) 135 - 145 mmol/L   Potassium 3.5 3.5 - 5.1 mmol/L   Chloride 90 (L) 98 - 111 mmol/L   CO2 11 (L) 22 - 32 mmol/L   Glucose, Bld 646 (HH) 70 - 99 mg/dL    Comment: CRITICAL RESULT CALLED TO, READ BACK BY AND VERIFIED WITH A PUGH RN 03/19/2023 1656 BNUNNERY Glucose reference range applies only to samples taken after fasting for at least 8 hours.    BUN 26 (H) 6 - 20 mg/dL   Creatinine, Ser 0.98 (H) 0.61 - 1.24 mg/dL   Calcium 8.6 (L) 8.9 - 10.3 mg/dL   GFR, Estimated 42 (L) >60 mL/min    Comment: (NOTE) Calculated using the CKD-EPI Creatinine Equation (2021)    Anion gap 26 (H) 5 - 15    Comment: Electrolytes repeated to confirm. Performed at [facility], [address] [place], [place]  [address]   Glucose, capillary     Status: Abnormal   Collection Time: 03/19/23  3:09 PM  Result Value Ref Range   Glucose-Capillary 575 (HH) 70 - 99 mg/dL    Comment: Glucose reference range applies only to samples taken after fasting for at least 8 hours.   Comment 1 Notify RN   MRSA Next Gen by PCR, Nasal     Status: None   Collection Time: 03/19/23  3:14 PM   Specimen: Nasal Mucosa; Nasal Swab  Result Value Ref Range   MRSA by PCR Next Gen NOT DETECTED NOT DETECTED    Comment: (NOTE) The GeneXpert MRSA Assay (FDA approved for NASAL specimens only), is one component of a comprehensive MRSA colonization surveillance program. It is not intended to diagnose MRSA infection nor to guide or monitor treatment for MRSA infections. Test performance is not FDA approved in patients less than 53 years old. Performed at [facility], [address] [place], [place] [address]   Glucose, capillary     Status: Abnormal   Collection Time: 03/19/23  3:37 PM  Result Value Ref Range   Glucose-Capillary 474 (H) 70 - 99 mg/dL    Comment: Glucose reference range applies only to samples taken after fasting for at least 8 hours.  Glucose, capillary     Status: Abnormal   Collection Time: 03/19/23  4:08 PM  Result Value Ref Range   Glucose-Capillary 419 (H) 70 - 99 mg/dL    Comment: Glucose reference range applies only to samples taken after fasting for at least 8  hours.  Glucose, capillary     Status: Abnormal   Collection Time: 03/19/23  4:41 PM  Result Value Ref Range   Glucose-Capillary 373 (H) 70 - 99 mg/dL    Comment: Glucose reference range applies only to samples taken after fasting for at least 8 hours.  Glucose, capillary     Status: Abnormal   Collection Time: 03/19/23  5:40 PM  Result Value Ref Range   Glucose-Capillary 289 (H) 70 - 99 mg/dL    Comment: Glucose reference range applies only to samples taken after fasting for at least 8 hours.  Glucose, capillary     Status: Abnormal    Collection Time: 03/19/23  6:41 PM  Result Value Ref Range   Glucose-Capillary 237 (H) 70 - 99 mg/dL    Comment: Glucose reference range applies only to samples taken after fasting for at least 8 hours.  Basic metabolic panel     Status: Abnormal   Collection Time: 03/19/23  6:44 PM  Result Value Ref Range   Sodium 128 (L) 135 - 145 mmol/L   Potassium 3.2 (L) 3.5 - 5.1 mmol/L   Chloride 91 (L) 98 - 111 mmol/L   CO2 18 (L) 22 - 32 mmol/L   Glucose, Bld 248 (H) 70 - 99 mg/dL    Comment: Glucose reference range applies only to samples taken after fasting for at least 8 hours.   BUN 21 (H) 6 - 20 mg/dL   Creatinine, Ser 5.18 (H) 0.61 - 1.24 mg/dL   Calcium 8.4 (L) 8.9 - 10.3 mg/dL   GFR, Estimated >84 >16 mL/min    Comment: (NOTE) Calculated using the CKD-EPI Creatinine Equation (2021)    Anion gap 19 (H) 5 - 15    Comment: Performed at [facility], [address], [place], [place] [address]  Lactic acid, plasma     Status: Abnormal   Collection Time: 03/19/23  6:44 PM  Result Value Ref Range   Lactic Acid, Venous >9.0 (HH) 0.5 - 1.9 mmol/L    Comment: CRITICAL VALUE NOTED. VALUE IS CONSISTENT WITH PREVIOUSLY REPORTED/CALLED VALUE Performed at [facility], [address] [place], [place] [address]   Lipase, blood     Status: Abnormal   Collection Time: 03/19/23  6:44 PM  Result Value Ref Range   Lipase 403 (H) 11 - 51 U/L    Comment: RESULTS CONFIRMED BY MANUAL DILUTION Performed at [facility], [address] [place], [place] [address]   Troponin I (High Sensitivity)     Status: Abnormal   Collection Time: 03/19/23  6:44 PM  Result Value Ref Range   Troponin I (High Sensitivity) 38 (H) <18 ng/L    Comment: (NOTE) Elevated high sensitivity troponin I (hsTnI) values and significant  changes across serial measurements may suggest ACS but many other  chronic and acute conditions are known to elevate hsTnI results.  Refer to the "Links" section for chest  pain algorithms and additional  guidance. Performed at [facility], [address] [place], [place] [address]   Magnesium     Status: None   Collection Time: 03/19/23  6:44 PM  Result Value Ref Range   Magnesium 1.9 1.7 - 2.4 mg/dL    Comment: Performed at [facility], [address], [place], [place] [address]  Troponin I (High Sensitivity)     Status: Abnormal   Collection Time: 03/19/23  7:40 PM  Result Value Ref Range  Troponin I (High Sensitivity) 38 (H) <18 ng/L    Comment: (NOTE) Elevated high sensitivity troponin I (hsTnI) values and significant  changes across serial measurements may suggest ACS but many other  chronic and acute conditions are known to elevate hsTnI results.  Refer to the "Links" section for chest pain algorithms and additional  guidance. Performed at [facility], [address] [place], [place] [address]   Glucose, capillary     Status: Abnormal   Collection Time: 03/19/23  7:40 PM  Result Value Ref Range   Glucose-Capillary 225 (H) 70 - 99 mg/dL    Comment: Glucose reference range applies only to samples taken after fasting for at least 8 hours.  Glucose, capillary     Status: Abnormal   Collection Time: 03/19/23  8:42 PM  Result Value Ref Range   Glucose-Capillary 198 (H) 70 - 99 mg/dL    Comment: Glucose reference range applies only to samples taken after fasting for at least 8 hours.  Glucose, capillary     Status: Abnormal   Collection Time: 03/19/23  9:44 PM  Result Value Ref Range   Glucose-Capillary 183 (H) 70 - 99 mg/dL    Comment: Glucose reference range applies only to samples taken after fasting for at least 8 hours.  Glucose, capillary     Status: Abnormal   Collection Time: 03/19/23 10:48 PM  Result Value Ref Range   Glucose-Capillary 187 (H) 70 - 99 mg/dL    Comment: Glucose reference range applies only to samples taken after fasting for at least 8 hours.  Glucose, capillary     Status: Abnormal   Collection  Time: 03/19/23 11:50 PM  Result Value Ref Range   Glucose-Capillary 152 (H) 70 - 99 mg/dL    Comment: Glucose reference range applies only to samples taken after fasting for at least 8 hours.  Basic metabolic panel     Status: Abnormal   Collection Time: 03/20/23 12:46 AM  Result Value Ref Range   Sodium 127 (L) 135 - 145 mmol/L   Potassium 4.3 3.5 - 5.1 mmol/L   Chloride 91 (L) 98 - 111 mmol/L   CO2 24 22 - 32 mmol/L   Glucose, Bld 161 (H) 70 - 99 mg/dL    Comment: Glucose reference range applies only to samples taken after fasting for at least 8 hours.   BUN 17 6 - 20 mg/dL   Creatinine, Ser 6.04 0.61 - 1.24 mg/dL   Calcium 8.4 (L) 8.9 - 10.3 mg/dL   GFR, Estimated >54 >09 mL/min    Comment: (NOTE) Calculated using the CKD-EPI Creatinine Equation (2021)    Anion gap 12 5 - 15    Comment: Performed at [facility], [address], [place], [place] [address]  CBC     Status: Abnormal   Collection Time: 03/20/23 12:46 AM  Result Value Ref Range   WBC 9.9 4.0 - 10.5 K/uL   RBC 3.37 (L) 4.22 - 5.81 MIL/uL   Hemoglobin 11.4 (L) 13.0 - 17.0 g/dL   HCT 47.8 (L) 29.5 - 62.1 %   MCV 91.7 80.0 - 100.0 fL   MCH 33.8 26.0 - 34.0 pg   MCHC 36.9 (H) 30.0 - 36.0 g/dL   RDW 30.8 65.7 - 84.6 %   Platelets 295 150 - 400 K/uL   nRBC 0.0 0.0 - 0.2 %    Comment: Performed at [facility], [address] [place], [place] [address]  Beta-hydroxybutyric  acid     Status: None   Collection Time: 03/20/23 12:46 AM  Result Value Ref Range   Beta-Hydroxybutyric Acid 0.24 0.05 - 0.27 mmol/L    Comment: Performed at [facility], [address] [place], [place] [address]  Lactic acid, plasma     Status: Abnormal   Collection Time: 03/20/23 12:46 AM  Result Value Ref Range   Lactic Acid, Venous 4.4 (HH) 0.5 - 1.9 mmol/L    Comment: CRITICAL VALUE NOTED. VALUE IS CONSISTENT WITH PREVIOUSLY REPORTED/CALLED VALUE Performed at [facility], [address], [place], [place]  [address]   Hepatic function panel     Status: Abnormal   Collection Time: 03/20/23 12:46 AM  Result Value Ref Range   Total Protein 4.8 (L) 6.5 - 8.1 g/dL   Albumin 2.8 (L) 3.5 - 5.0 g/dL   AST 61 (H) 15 - 41 U/L   ALT 153 (H) 0 - 44 U/L   Alkaline Phosphatase 177 (H) 38 - 126 U/L   Total Bilirubin 0.6 0.3 - 1.2 mg/dL   Bilirubin, Direct <8.2 0.0 - 0.2 mg/dL   Indirect Bilirubin NOT CALCULATED 0.3 - 0.9 mg/dL    Comment: Performed at [facility], [address] [place], [place] [address]  Magnesium     Status: None   Collection Time: 03/20/23 12:46 AM  Result Value Ref Range   Magnesium 1.9 1.7 - 2.4 mg/dL    Comment: Performed at [facility], [address], [place], [place] [address]  Phosphorus     Status: Abnormal   Collection Time: 03/20/23 12:46 AM  Result Value Ref Range   Phosphorus 2.3 (L) 2.5 - 4.6 mg/dL    Comment: Performed at [facility], [address] [place], [place] [address]  Glucose, capillary     Status: Abnormal   Collection Time: 03/20/23 12:52 AM  Result Value Ref Range   Glucose-Capillary 180 (H) 70 - 99 mg/dL    Comment: Glucose reference range applies only to samples taken after fasting for at least 8 hours.  Glucose, capillary     Status: Abnormal   Collection Time: 03/20/23  1:56 AM  Result Value Ref Range   Glucose-Capillary 248 (H) 70 - 99 mg/dL    Comment: Glucose reference range applies only to samples taken after fasting for at least 8 hours.  Heparin level (unfractionated)     Status: None   Collection Time: 03/20/23  2:42 AM  Result Value Ref Range   Heparin Unfractionated 0.49 0.30 - 0.70 IU/mL    Comment: (NOTE) The clinical reportable range upper limit is being lowered to >1.10 to align with the FDA approved guidance for the current laboratory assay.  If heparin results are below expected values, and patient dosage has  been confirmed, suggest follow up testing of antithrombin III levels. Performed at [facility], [address], [place], [place] [address]   Glucose, capillary     Status: Abnormal   Collection Time: 03/20/23  2:52 AM  Result Value Ref Range   Glucose-Capillary 211 (H) 70 - 99 mg/dL    Comment: Glucose reference range applies only to samples taken after fasting for at least 8 hours.  Glucose, capillary     Status: Abnormal   Collection Time: 03/20/23  3:59 AM  Result Value Ref Range   Glucose-Capillary 169 (H) 70 - 99 mg/dL    Comment: Glucose reference range applies only to  samples taken after fasting for at least 8 hours.  Glucose, capillary     Status: Abnormal   Collection Time: 03/20/23  4:58 AM  Result Value Ref Range   Glucose-Capillary 162 (H) 70 - 99 mg/dL    Comment: Glucose reference range applies only to samples taken after fasting for at least 8 hours.  Glucose, capillary     Status: Abnormal   Collection Time: 03/20/23  5:41 AM  Result Value Ref Range   Glucose-Capillary 157 (H) 70 - 99 mg/dL    Comment: Glucose reference range applies only to samples taken after fasting for at least 8 hours.  Glucose, capillary     Status: Abnormal   Collection Time: 03/20/23  6:44 AM  Result Value Ref Range   Glucose-Capillary 180 (H) 70 - 99 mg/dL    Comment: Glucose reference range applies only to samples taken after fasting for at least 8 hours.  Glucose, capillary     Status: Abnormal   Collection Time: 03/20/23  8:22 AM  Result Value Ref Range   Glucose-Capillary 148 (H) 70 - 99 mg/dL    Comment: Glucose reference range applies only to samples taken after fasting for at least 8 hours.  Basic metabolic panel     Status: Abnormal   Collection Time: 03/20/23  9:45 AM  Result Value Ref Range   Sodium 130 (L) 135 - 145 mmol/L   Potassium 4.0 3.5 - 5.1 mmol/L   Chloride 96 (L) 98 - 111 mmol/L   CO2 23 22 - 32 mmol/L   Glucose, Bld 138 (H) 70 - 99 mg/dL    Comment: Glucose reference range applies only to samples taken after fasting for at least 8 hours.   BUN 14  6 - 20 mg/dL   Creatinine, Ser 1.61 0.61 - 1.24 mg/dL   Calcium 8.2 (L) 8.9 - 10.3 mg/dL   GFR, Estimated >09 >60 mL/min    Comment: (NOTE) Calculated using the CKD-EPI Creatinine Equation (2021)    Anion gap 11 5 - 15    Comment: Performed at [facility], [address], [place], [place] [address]  Heparin level (unfractionated)     Status: None   Collection Time: 03/20/23  9:45 AM  Result Value Ref Range   Heparin Unfractionated 0.43 0.30 - 0.70 IU/mL    Comment: (NOTE) The clinical reportable range upper limit is being lowered to >1.10 to align with the FDA approved guidance for the current laboratory assay.  If heparin results are below expected values, and patient dosage has  been confirmed, suggest follow up testing of antithrombin III levels. Performed at [facility], [address], [place], [place] [address]   Glucose, capillary     Status: None   Collection Time: 03/20/23 11:47 AM  Result Value Ref Range   Glucose-Capillary 88 70 - 99 mg/dL    Comment: Glucose reference range applies only to samples taken after fasting for at least 8 hours.  Glucose, capillary     Status: Abnormal   Collection Time: 03/20/23  4:15 PM  Result Value Ref Range   Glucose-Capillary 43 (LL) 70 - 99 mg/dL    Comment: Glucose reference range applies only to samples taken after fasting for at least 8 hours.   Comment 1 Document in Chart   Glucose, capillary     Status: Abnormal   Collection Time: 03/20/23  4:18 PM  Result Value Ref Range   Glucose-Capillary 45 (L) 70 - 99  mg/dL    Comment: Glucose reference range applies only to samples taken after fasting for at least 8 hours.  Glucose, capillary     Status: Abnormal   Collection Time: 03/20/23  4:38 PM  Result Value Ref Range   Glucose-Capillary 206 (H) 70 - 99 mg/dL    Comment: Glucose reference range applies only to samples taken after fasting for at least 8 hours.  Glucose, capillary     Status: Abnormal   Collection  Time: 03/20/23  7:23 PM  Result Value Ref Range   Glucose-Capillary 133 (H) 70 - 99 mg/dL    Comment: Glucose reference range applies only to samples taken after fasting for at least 8 hours.  Glucose, capillary     Status: None   Collection Time: 03/20/23 11:21 PM  Result Value Ref Range   Glucose-Capillary 71 70 - 99 mg/dL    Comment: Glucose reference range applies only to samples taken after fasting for at least 8 hours.  Heparin level (unfractionated)     Status: None   Collection Time: 03/21/23 12:32 AM  Result Value Ref Range   Heparin Unfractionated 0.32 0.30 - 0.70 IU/mL    Comment: (NOTE) The clinical reportable range upper limit is being lowered to >1.10 to align with the FDA approved guidance for the current laboratory assay.  If heparin results are below expected values, and patient dosage has  been confirmed, suggest follow up testing of antithrombin III levels. Performed at [facility], [address] [place], [place] [address]   CBC with Differential/Platelet     Status: Abnormal   Collection Time: 03/21/23 12:32 AM  Result Value Ref Range   WBC 9.3 4.0 - 10.5 K/uL   RBC 3.16 (L) 4.22 - 5.81 MIL/uL   Hemoglobin 10.7 (L) 13.0 - 17.0 g/dL   HCT 60.4 (L) 54.0 - 98.1 %   MCV 95.9 80.0 - 100.0 fL   MCH 33.9 26.0 - 34.0 pg   MCHC 35.3 30.0 - 36.0 g/dL   RDW 19.1 47.8 - 29.5 %   Platelets 204 150 - 400 K/uL   nRBC 0.0 0.0 - 0.2 %   Neutrophils Relative % 69 %   Neutro Abs 6.4 1.7 - 7.7 K/uL   Lymphocytes Relative 25 %   Lymphs Abs 2.3 0.7 - 4.0 K/uL   Monocytes Relative 5 %   Monocytes Absolute 0.4 0.1 - 1.0 K/uL   Eosinophils Relative 1 %   Eosinophils Absolute 0.1 0.0 - 0.5 K/uL   Basophils Relative 0 %   Basophils Absolute 0.0 0.0 - 0.1 K/uL   Immature Granulocytes 0 %   Abs Immature Granulocytes 0.02 0.00 - 0.07 K/uL    Comment: Performed at [facility], [address], [place], [place] [address]  Basic metabolic panel     Status: Abnormal    Collection Time: 03/21/23 12:32 AM  Result Value Ref Range   Sodium 132 (L) 135 - 145 mmol/L   Potassium 2.9 (L) 3.5 - 5.1 mmol/L   Chloride 95 (L) 98 - 111 mmol/L   CO2 29 22 - 32 mmol/L   Glucose, Bld 62 (L) 70 - 99 mg/dL    Comment: Glucose reference range applies only to samples taken after fasting for at least 8 hours.   BUN 7 6 - 20 mg/dL   Creatinine, Ser 8.65 0.61 - 1.24 mg/dL   Calcium 8.1 (L) 8.9 - 10.3 mg/dL   GFR, Estimated >78 >46 mL/min  Comment: (NOTE) Calculated using the CKD-EPI Creatinine Equation (2021)    Anion gap 8 5 - 15    Comment: Performed at [facility], [address] [place], [place] [address]  Magnesium     Status: None   Collection Time: 03/21/23 12:32 AM  Result Value Ref Range   Magnesium 1.9 1.7 - 2.4 mg/dL    Comment: Performed at [facility], [address], [place], [place] [address]  Phosphorus     Status: Abnormal   Collection Time: 03/21/23 12:32 AM  Result Value Ref Range   Phosphorus 1.3 (L) 2.5 - 4.6 mg/dL    Comment: Performed at [facility], [address] [place], [place] [address]  Glucose, capillary     Status: Abnormal   Collection Time: 03/21/23  3:21 AM  Result Value Ref Range   Glucose-Capillary 52 (L) 70 - 99 mg/dL    Comment: Glucose reference range applies only to samples taken after fasting for at least 8 hours.  Glucose, capillary     Status: None   Collection Time: 03/21/23  7:35 AM  Result Value Ref Range   Glucose-Capillary 84 70 - 99 mg/dL    Comment: Glucose reference range applies only to samples taken after fasting for at least 8 hours.  Glucose, capillary     Status: Abnormal   Collection Time: 03/21/23 11:28 AM  Result Value Ref Range   Glucose-Capillary 227 (H) 70 - 99 mg/dL    Comment: Glucose reference range applies only to samples taken after fasting for at least 8 hours.  Basic metabolic panel     Status: Abnormal   Collection Time: 03/21/23 11:56 AM  Result Value Ref Range    Sodium 127 (L) 135 - 145 mmol/L   Potassium 3.8 3.5 - 5.1 mmol/L   Chloride 92 (L) 98 - 111 mmol/L   CO2 25 22 - 32 mmol/L   Glucose, Bld 232 (H) 70 - 99 mg/dL    Comment: Glucose reference range applies only to samples taken after fasting for at least 8 hours.   BUN 6 6 - 20 mg/dL   Creatinine, Ser 9.14 0.61 - 1.24 mg/dL   Calcium 7.8 (L) 8.9 - 10.3 mg/dL   GFR, Estimated >78 >29 mL/min    Comment: (NOTE) Calculated using the CKD-EPI Creatinine Equation (2021)    Anion gap 10 5 - 15    Comment: Performed at [facility], [address], [place], [place] [address]    Current Facility-Administered Medications  Medication Dose Route Frequency Provider Last Rate Last Admin   acetaminophen (TYLENOL) tablet 650 mg  650 mg Oral Q4H PRN Oretha Milch, MD       ARIPiprazole (ABILIFY) tablet 10 mg  10 mg Oral Daily Tehani Mersman, MD   10 mg at 03/21/23 0932   aspirin chewable tablet 81 mg  81 mg Oral Daily Oretha Milch, MD   81 mg at 03/21/23 0932   dextrose 5 % in lactated ringers infusion   Intravenous Continuous Paliwal, Aditya, MD   Stopped at 03/21/23 1017   dextrose 50 % solution 0-50 mL  0-50 mL Intravenous PRN Oretha Milch, MD   50 mL at 03/20/23 1624   docusate sodium (COLACE) capsule 100 mg  100 mg Oral BID PRN Oretha Milch, MD       folic acid (FOLVITE) tablet 1 mg  1 mg Oral Daily Oretha Milch, MD  1 mg at 03/21/23 0932   heparin ADULT infusion 100 units/mL (25000 units/282mL)  850 Units/hr Intravenous Continuous Link Snuffer B, RPH 8.5 mL/hr at 03/21/23 1100 850 Units/hr at 03/21/23 1100   HYDROcodone-acetaminophen (NORCO/VICODIN) 5-325 MG per tablet 1 tablet  1 tablet Oral Q6H PRN Oretha Milch, MD   1 tablet at 03/21/23 0933   insulin aspart (novoLOG) injection 2-6 Units  2-6 Units Subcutaneous Q4H Oretha Milch, MD   2 Units at 03/21/23 1140   insulin detemir (LEVEMIR) injection 5 Units  5 Units Subcutaneous QHS Oretha Milch, MD       metoCLOPramide  (REGLAN) tablet 5 mg  5 mg Oral Q8H PRN Oretha Milch, MD       multivitamin with minerals tablet 1 tablet  1 tablet Oral Daily Oretha Milch, MD   1 tablet at 03/21/23 0932   ondansetron (ZOFRAN) injection 4 mg  4 mg Intravenous Q6H PRN Oretha Milch, MD   4 mg at 03/20/23 1234   Oral care mouth rinse  15 mL Mouth Rinse PRN Oretha Milch, MD       polyethylene glycol (MIRALAX / GLYCOLAX) packet 17 g  17 g Oral Daily PRN Oretha Milch, MD       potassium PHOSPHATE 45 mmol in dextrose 5 % 500 mL infusion  45 mmol Intravenous Once Paliwal, Aditya, MD 64.4 mL/hr at 03/21/23 1100 Infusion Verify at 03/21/23 1100   thiamine (VITAMIN B1) tablet 100 mg  100 mg Oral Daily Oretha Milch, MD   100 mg at 03/21/23 0932    Musculoskeletal: Strength & Muscle Tone: unable to access Gait & Station: unsteady Patient leans: N/A    Psychiatric Specialty Exam:  Presentation  General Appearance:  Fairly Groomed  Eye Contact: Good  Speech: Pressured  Speech Volume: Increased  Handedness: Right   Mood and Affect  Mood: Dysphoric  Affect: Constricted   Thought Process  Thought Processes: Linear  Descriptions of Associations:Intact  Orientation:Full (Time, Place and Person)  Thought Content:Logical  History of Schizophrenia/Schizoaffective disorder:No data recorded Duration of Psychotic Symptoms:No data recorded Hallucinations:Hallucinations: None  Ideas of Reference:None  Suicidal Thoughts:Suicidal Thoughts: No  Homicidal Thoughts:Homicidal Thoughts: No   Sensorium  Memory: Immediate Fair; Recent Fair; Remote Fair  Judgment: Fair  Insight: Shallow   Executive Functions  Concentration: Fair  Attention Span: Fair  Recall: Fair  Fund of Knowledge: Fair  Language: Fair   Psychomotor Activity  Psychomotor Activity: Psychomotor Activity: Increased   Assets  Assets: Communication Skills; Social Support   Sleep  Sleep: Sleep:  Fair   Physical Exam: Physical Exam Review of Systems  Psychiatric/Behavioral:  Positive for depression and substance abuse.    Blood pressure (!) 128/98, pulse (!) 184, temperature 97.6 F (36.4 C), temperature source Axillary, resp. rate 16, weight 54.3 kg, SpO2 95 %. Body mass index is 17.68 kg/m.  Treatment Plan Summary: 52 y/o male with history of Schizophrenia, paranoid type and depressed who was admitted for DKA due to non-compliant with his diabetic medications. He reportedly expressed suicidal thoughts overnight and today he refused to cooperate with full Capacity evaluation. Patient's ability to get easily agitated, impulsive in combination with expressing suicidal thoughts makes him a danger to himself. Even though, it is difficult to determine if he has capacity to make decision or not as he refused to cooperate with assessment. I will recommend that we Err on the side of caution.  Recommendations: -Continue 1:1 sitter for  safety -Continue Abilify 10 mg daily for Schizophrenia and depression -Consider initiating Involuntary commitment if patient try to leave hospital AMA as he is deemed a danger to himself at this time.  Disposition: Recommend psychiatric Inpatient admission when medically cleared. Psychiatric service will follow the patient daily  Thedore Mins, MD 03/21/2023 12:52 PM

## 2023-03-21 NOTE — Progress Notes (Signed)
[facility] ADULT ICU REPLACEMENT PROTOCOL   The patient does apply for the [facility] Adult ICU Electrolyte Replacment Protocol based on the criteria listed below:   1.Exclusion criteria: TCTS, ECMO, Dialysis, and Myasthenia Gravis patients 2. Is GFR >/= 30 ml/min? Yes.    Patient's GFR today is >60 3. Is SCr </= 2? Yes.   Patient's SCr is 0.74 mg/dL 4. Did SCr increase >/= 0.5 in 24 hours? No. 5.Pt's weight >40kg  Yes.   6. Abnormal electrolyte(s): Potassium 2.9, Magnesium 1.9, Phosphorus 1.3  7. Electrolytes replaced per protocol 8.  Call MD STAT for K+ </= 2.5, Phos </= 1, or Mag </= 1 Physician:  Dr. Faye Ramsay A Kamylah Manzo 03/21/2023 2:04 AM

## 2023-03-21 NOTE — Progress Notes (Signed)
eLink Physician-Brief Progress Note Patient Name: Tom Anderson DOB: [birth year] MRN: [id number]   Date of Service  03/21/2023  HPI/Events of Note  Hyperglycemia Insomnia Electrolyte imbalance  eICU Interventions       Intervention Category Evaluation Type: Other  Carilyn Goodpasture 03/21/2023, 7:56 PM

## 2023-03-22 ENCOUNTER — Other Ambulatory Visit (HOSPITAL_COMMUNITY): Payer: Self-pay

## 2023-03-22 DIAGNOSIS — I5020 Unspecified systolic (congestive) heart failure: Secondary | ICD-10-CM

## 2023-03-22 DIAGNOSIS — E1010 Type 1 diabetes mellitus with ketoacidosis without coma: Secondary | ICD-10-CM | POA: Diagnosis not present

## 2023-03-22 DIAGNOSIS — N179 Acute kidney failure, unspecified: Secondary | ICD-10-CM | POA: Diagnosis not present

## 2023-03-22 DIAGNOSIS — F1220 Cannabis dependence, uncomplicated: Secondary | ICD-10-CM

## 2023-03-22 DIAGNOSIS — F330 Major depressive disorder, recurrent, mild: Secondary | ICD-10-CM

## 2023-03-22 DIAGNOSIS — R0989 Other specified symptoms and signs involving the circulatory and respiratory systems: Secondary | ICD-10-CM

## 2023-03-22 DIAGNOSIS — I8290 Acute embolism and thrombosis of unspecified vein: Secondary | ICD-10-CM

## 2023-03-22 DIAGNOSIS — G9340 Encephalopathy, unspecified: Secondary | ICD-10-CM | POA: Diagnosis not present

## 2023-03-22 DIAGNOSIS — F209 Schizophrenia, unspecified: Secondary | ICD-10-CM

## 2023-03-22 LAB — PHOSPHORUS: Phosphorus: 3.7 mg/dL (ref 2.5–4.6)

## 2023-03-22 LAB — GLUCOSE, CAPILLARY
Glucose-Capillary: 126 mg/dL — ABNORMAL HIGH (ref 70–99)
Glucose-Capillary: 145 mg/dL — ABNORMAL HIGH (ref 70–99)
Glucose-Capillary: 146 mg/dL — ABNORMAL HIGH (ref 70–99)
Glucose-Capillary: 158 mg/dL — ABNORMAL HIGH (ref 70–99)
Glucose-Capillary: 239 mg/dL — ABNORMAL HIGH (ref 70–99)
Glucose-Capillary: 249 mg/dL — ABNORMAL HIGH (ref 70–99)
Glucose-Capillary: 286 mg/dL — ABNORMAL HIGH (ref 70–99)
Glucose-Capillary: 347 mg/dL — ABNORMAL HIGH (ref 70–99)
Glucose-Capillary: 83 mg/dL (ref 70–99)

## 2023-03-22 LAB — CBC
HCT: 29.5 % — ABNORMAL LOW (ref 39.0–52.0)
Hemoglobin: 10.1 g/dL — ABNORMAL LOW (ref 13.0–17.0)
MCH: 33.6 pg (ref 26.0–34.0)
MCHC: 34.2 g/dL (ref 30.0–36.0)
MCV: 98 fL (ref 80.0–100.0)
Platelets: 174 10*3/uL (ref 150–400)
RBC: 3.01 MIL/uL — ABNORMAL LOW (ref 4.22–5.81)
RDW: 12.8 % (ref 11.5–15.5)
WBC: 8 10*3/uL (ref 4.0–10.5)
nRBC: 0 % (ref 0.0–0.2)

## 2023-03-22 LAB — BASIC METABOLIC PANEL
Anion gap: 10 (ref 5–15)
BUN: 11 mg/dL (ref 6–20)
CO2: 26 mmol/L (ref 22–32)
Calcium: 8.2 mg/dL — ABNORMAL LOW (ref 8.9–10.3)
Chloride: 92 mmol/L — ABNORMAL LOW (ref 98–111)
Creatinine, Ser: 0.81 mg/dL (ref 0.61–1.24)
GFR, Estimated: >60 mL/min (ref >60)
Glucose, Bld: 146 mg/dL — ABNORMAL HIGH (ref 70–99)
Potassium: 3.7 mmol/L (ref 3.5–5.1)
Sodium: 128 mmol/L — ABNORMAL LOW (ref 135–145)

## 2023-03-22 LAB — HEPARIN LEVEL (UNFRACTIONATED): Heparin Unfractionated: 0.14 IU/mL — ABNORMAL LOW (ref 0.30–0.70)

## 2023-03-22 LAB — HEMOGLOBIN A1C
Hgb A1c MFr Bld: 14.2 % — ABNORMAL HIGH (ref 4.8–5.6)
Mean Plasma Glucose: 360.84 mg/dL

## 2023-03-22 LAB — CULTURE, BLOOD (ROUTINE X 2): Culture: NO GROWTH

## 2023-03-22 LAB — MAGNESIUM: Magnesium: 2.2 mg/dL (ref 1.7–2.4)

## 2023-03-22 MED ORDER — HEPARIN BOLUS VIA INFUSION
1500.0000 [IU] | Freq: Once | INTRAVENOUS | Status: AC
  Administered 2023-03-22: 1500 [IU] via INTRAVENOUS
  Filled 2023-03-22: qty 1500

## 2023-03-22 MED ORDER — INSULIN DETEMIR 100 UNIT/ML ~~LOC~~ SOLN
5.0000 [IU] | Freq: Every day | SUBCUTANEOUS | Status: DC
  Filled 2023-03-22: qty 0.05

## 2023-03-22 MED ORDER — INSULIN ASPART 100 UNIT/ML IJ SOLN
0.0000 [IU] | Freq: Three times a day (TID) | INTRAMUSCULAR | Status: DC
  Administered 2023-03-22: 7 [IU] via SUBCUTANEOUS
  Administered 2023-03-22: 1 [IU] via SUBCUTANEOUS

## 2023-03-22 MED ORDER — RIVAROXABAN 15 MG PO TABS
15.0000 mg | ORAL_TABLET | Freq: Two times a day (BID) | ORAL | Status: DC
  Administered 2023-03-22 – 2023-03-23 (×3): 15 mg via ORAL
  Filled 2023-03-22 (×3): qty 1

## 2023-03-22 MED ORDER — INSULIN DETEMIR 100 UNIT/ML ~~LOC~~ SOLN
5.0000 [IU] | Freq: Every day | SUBCUTANEOUS | Status: DC
  Administered 2023-03-22: 5 [IU] via SUBCUTANEOUS
  Filled 2023-03-22 (×2): qty 0.05

## 2023-03-22 MED ORDER — RIVAROXABAN 20 MG PO TABS: 20.0000 mg | ORAL_TABLET | Freq: Every day | ORAL | Status: DC

## 2023-03-22 MED ORDER — INSULIN ASPART 100 UNIT/ML IJ SOLN
0.0000 [IU] | Freq: Every day | INTRAMUSCULAR | Status: DC
  Administered 2023-03-22: 3 [IU] via SUBCUTANEOUS

## 2023-03-22 MED ORDER — INSULIN ASPART 100 UNIT/ML IJ SOLN: 0.0000 [IU] | Freq: Three times a day (TID) | INTRAMUSCULAR | Status: DC

## 2023-03-22 NOTE — Progress Notes (Addendum)
ANTICOAGULATION CONSULT NOTE - Follow up  Pharmacy Consult for Heparin Indication:  aortic emboli  Allergies  Allergen Reactions   Nitrous Oxide Nausea And Vomiting    Patient Measurements: Weight: 54.3 kg (119 lb 11.4 oz) Heparin Dosing Weight: 54.3 kg  Vital Signs: Temp: 98.2 F (36.8 C) (04/21 2341) Temp Source: Oral (04/21 2341) BP: 135/105 (04/22 0300) Pulse Rate: 91 (04/22 0300)  Labs: Recent Labs    03/19/23 1844 03/19/23 1940 03/20/23 0046 03/20/23 0242 03/20/23 0945 03/21/23 0032 03/21/23 1156 03/22/23 0205  HGB  --   --  11.4*  --   --  10.7*  --  10.1*  HCT  --   --  30.9*  --   --  30.3*  --  29.5*  PLT  --   --  295  --   --  204  --  174  HEPARINUNFRC  --   --   --    < > 0.43 0.32  --  0.14*  CREATININE 1.42*  --  1.12  --  0.99 0.74 0.79 0.81  TROPONINIHS 38* 38*  --   --   --   --   --   --    < > = values in this interval not displayed.     Estimated Creatinine Clearance: 84.7 mL/min (by C-G formula based on SCr of 0.81 mg/dL).   Medical History: Past Medical History:  Diagnosis Date   Asthma    Depression    Diabetes mellitus without complication (HCC)    Schizophrenia (HCC)     Medications:  Scheduled:   ARIPiprazole  10 mg Oral Daily   aspirin  81 mg Oral Daily   folic acid  1 mg Oral Daily   insulin aspart  0-15 Units Subcutaneous Q4H   multivitamin with minerals  1 tablet Oral Daily   ramelteon  8 mg Oral QHS   thiamine  100 mg Oral Daily    Assessment: 52 yo male presenting with sepsis and AKI. Noted to have epigastric pain. 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 No AC PTA. Received 1 dose of SQH ~1530 on 4/19.  Heparin level SUBtherapeutic this morning at 0.14. Per RN there have been no interruptions or issues with the infusion.   Goal of Therapy:  Heparin level 0.3-0.7 units/ml Monitor platelets by anticoagulation protocol: Yes   Plan:  Give heparin 1500 unit  bolus Increase heparin infusion rate to 1000 units/hr 6 hour heparin level Daily heparin levels Continue to monitor CBC and for signs/symptoms of bleeding   Thank you for allowing [place] to participate in this patients care. Signe Colt, PharmD 03/22/2023 3:39 AM  **Pharmacist phone directory can be found on [email or web address] listed under [facility]**

## 2023-03-22 NOTE — Progress Notes (Addendum)
  Progress Note    03/22/2023 7:46 AM * No surgery found *  Subjective:  wants to go home   Vitals:   03/22/23 0600 03/22/23 0700  BP: 118/87 111/85  Pulse: 88 89  Resp: 19 20  Temp:    SpO2: 100% 100%   Physical Exam: Lungs:  non labored Extremities:  symmetrical radial and DP pulses Abdomen:  soft, NT, ND Neurologic: a&O  CBC    Component Value Date/Time   WBC 8.0 03/22/2023 0205   RBC 3.01 (L) 03/22/2023 0205   HGB 10.1 (L) 03/22/2023 0205   HCT 29.5 (L) 03/22/2023 0205   PLT 174 03/22/2023 0205   MCV 98.0 03/22/2023 0205   MCH 33.6 03/22/2023 0205   MCHC 34.2 03/22/2023 0205   RDW 12.8 03/22/2023 0205   LYMPHSABS 2.3 03/21/2023 0032   MONOABS 0.4 03/21/2023 0032   EOSABS 0.1 03/21/2023 0032   BASOSABS 0.0 03/21/2023 0032    BMET    Component Value Date/Time   NA 128 (L) 03/22/2023 0205   K 3.7 03/22/2023 0205   CL 92 (L) 03/22/2023 0205   CO2 26 03/22/2023 0205   GLUCOSE 146 (H) 03/22/2023 0205   BUN 11 03/22/2023 0205   CREATININE 0.81 03/22/2023 0205   CALCIUM 8.2 (L) 03/22/2023 0205   GFRNONAA >60 03/22/2023 0205    INR No results found for: "INR"   Intake/Output Summary (Last 24 hours) at 03/22/2023 0746 Last data filed at 03/22/2023 1610 Gross per 24 hour  Intake 3560.28 ml  Output 6400 ml  Net -2839.72 ml     Assessment/Plan:  52 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  No signs or symptoms of malperfusion on exam.  Echo negative for embolic etiology.  CTA yesterday with no interval change in thoracic and abdominal aortic thrombus.  Ok to transition to DOAC from heparin.  Dr. Chestine Spore recommending at least 3 months of anticoagulation.    Emilie Rutter, PA-C [facility] [phone] 03/22/2023 7:46 AM   I have seen and evaluated the patient. I agree with the PA note as documented above.  CTA yesterday reviewed with no significant interval change in the descending  thoracic and abdominal aortic thrombus that is nonocclusive.  This does appear acute to me.  There is some associated atherosclerotic plaque adjacent to this thrombus.  I discussed with the patient he needs anticoagulation at discharge.  I will see him in 1 month with repeat CTA chest abdomen pelvis.  At a minimum I think he will require 3 months of anticoagulation but we can further decide this pending his follow-up imaging.  No signs of malperfusion with palpable pedal pulses.  Cephus Shelling, MD [facility] [place] Office: [phone]

## 2023-03-22 NOTE — Inpatient Diabetes Management (Addendum)
Inpatient Diabetes Program Recommendations  AACE/ADA: New Consensus Statement on Inpatient Glycemic Control (2015)  Target Ranges:  Prepandial:   less than 140 mg/dL      Peak postprandial:   less than 180 mg/dL (1-2 hours)      Critically ill patients:  140 - 180 mg/dL   Lab Results  Component Value Date   GLUCAP 249 (H) 03/22/2023   HGBA1C 11.8 (H) 01/12/2023    Review of Glycemic Control  Latest Reference Range & Units 03/22/23 02:00 03/22/23 03:55 03/22/23 06:23 03/22/23 08:17  Glucose-Capillary 70 - 99 mg/dL 161 (H) 096 (H) 83 045 (H)  (H): Data is abnormally high  History: Type 1 Diabetes, Schizophrenia   Home DM Meds: Novolog 1-18 units QID                              Levemir 8 units QHS Current orders for Inpatient glycemic control: Novolog 0-9 units TID & HS, Levemir 5 units QHS  Of note: Patient's 6th admission for DKA since January. DM coordinators have spoken with patient and patient's mother on 01/13/23 & 02/10/23   Inpatient Diabetes Program Recommendations:    Consider changing Levemir dose to QD (to start now). Secure chat sent to MD.   Will plan to see.   Addendum: Spoke with patient and parents at bedside. Patient states, "I have all my supplies and do things as I should." During conversation, patient's father is shaking head in dispute of statement. Both parents express concern for their son's life and are concerned; they constantly have to remind patient to take insulin and doubt he is doing as he should.  Patient is followed by Dr Katrinka Blazing, outpatient endocrinology and was seen within the last three months. During visit, carb ratios were decreased due to hypoglycemia.  Reviewed patient's current A1c of 11.8%. Explained what a A1c is and what it measures. Also reviewed goal A1c with patient, importance of good glucose control @ home, and blood sugar goals. Reviewed impact of poor glycemic control, role of pancreas, impact of frequent hypoglycemia, how to conquer  inability to keep food down, questions to ask endocrinology, sick day rules, impact of mental health on glycemic control, vascular changes, and commorbidities.  Patient has Dexcom, although one is not applied. Parent report that patient will respond to blood sugars based "on how he feels". Reviewed the importance of checking blood sugars and verifying prior to interventions, especially given A1C.  Patient's mother plans to ensure follow up with endocrinology.   Question if patient will need reduced doses at discharge. Follow.   Thanks, Lujean Rave, MSN, RNC-OB Diabetes Coordinator [phone] ([phone]-5p)

## 2023-03-22 NOTE — Progress Notes (Signed)
Progress Note  Patient Name: Tom Anderson Date of Encounter: 03/22/2023  Primary Cardiologist: None   Subjective   Patient was seen and examined at his bedside.  He has a Comptroller with him.  He was awake and alert sitting on the bed.  Inpatient Medications    Scheduled Meds:  ARIPiprazole  10 mg Oral Daily   aspirin  81 mg Oral Daily   folic acid  1 mg Oral Daily   insulin aspart  0-15 Units Subcutaneous Q4H   multivitamin with minerals  1 tablet Oral Daily   ramelteon  8 mg Oral QHS   thiamine  100 mg Oral Daily   Continuous Infusions:  heparin 1,000 Units/hr (03/22/23 0800)   lactated ringers 100 mL/hr at 03/22/23 0800   PRN Meds: acetaminophen, dextrose, docusate sodium, HYDROcodone-acetaminophen, metoCLOPramide, ondansetron (ZOFRAN) IV, mouth rinse, polyethylene glycol   Vital Signs    Vitals:   03/22/23 0600 03/22/23 0700 03/22/23 0749 03/22/23 0800  BP: 118/87 111/85  112/85  Pulse: 88 89 87 90  Resp: 19 20 16 15   Temp:   98.2 F (36.8 C)   TempSrc:   Oral   SpO2: 100% 100% 100% 100%  Weight:        Intake/Output Summary (Last 24 hours) at 03/22/2023 0903 Last data filed at 03/22/2023 0800 Gross per 24 hour  Intake 3571.85 ml  Output 6000 ml  Net -2428.15 ml   Filed Weights   03/21/23 0500 03/21/23 0800 03/22/23 0404  Weight: 33.7 kg 54.3 kg 52.4 kg    Telemetry    Sinus rhythm - Personally Reviewed  ECG    none - Personally Reviewed  Physical Exam   General: Comfortable Head: Atraumatic, normal size  Eyes: PEERLA, EOMI  Neck: Supple, normal JVD Cardiac: Normal S1, S2; RRR; no murmurs, rubs, or gallops Lungs: Clear to auscultation bilaterally Abd: Soft, nontender, no hepatomegaly  Ext: warm, no edema Musculoskeletal: No deformities, BUE and BLE strength normal and equal Skin: Warm and dry, no rashes   Neuro: Alert and oriented to person, place, time, and situation, CNII-XII grossly intact, no focal deficits  Psych: Normal mood and  affect   Labs    Chemistry Recent Labs  Lab 03/19/23 0913 03/19/23 1610 03/20/23 0046 03/20/23 0945 03/21/23 0032 03/21/23 1156 03/22/23 0205  NA 125*   < > 127*   < > 132* 127* 128*  K 4.8   < > 4.3   < > 2.9* 3.8 3.7  CL 79*   < > 91*   < > 95* 92* 92*  CO2 <7*   < > 24   < > 29 25 26   GLUCOSE >1,200*   < > 161*   < > 62* 232* 146*  BUN 31*   < > 17   < > 7 6 11   CREATININE 2.44*   < > 1.12   < > 0.74 0.79 0.81  CALCIUM 9.1   < > 8.4*   < > 8.1* 7.8* 8.2*  PROT 7.2  --  4.8*  --   --   --   --   ALBUMIN 4.2  --  2.8*  --   --   --   --   AST 96*  --  61*  --   --   --   --   ALT 250*  --  153*  --   --   --   --   ALKPHOS 299*  --  177*  --   --   --   --  BILITOT 1.7*  --  0.6  --   --   --   --   GFRNONAA 32*   < > >60   < > >60 >60 >60  ANIONGAP NOT CALCULATED   < > 12   < > 8 10 10    < > = values in this interval not displayed.     Hematology Recent Labs  Lab 03/20/23 0046 03/21/23 0032 03/22/23 0205  WBC 9.9 9.3 8.0  RBC 3.37* 3.16* 3.01*  HGB 11.4* 10.7* 10.1*  HCT 30.9* 30.3* 29.5*  MCV 91.7 95.9 98.0  MCH 33.8 33.9 33.6  MCHC 36.9* 35.3 34.2  RDW 11.8 12.6 12.8  PLT 295 204 174    Cardiac EnzymesNo results for input(s): "TROPONINI" in the last 168 hours. No results for input(s): "TROPIPOC" in the last 168 hours.   BNPNo results for input(s): "BNP", "PROBNP" in the last 168 hours.   DDimer No results for input(s): "DDIMER" in the last 168 hours.   Radiology    CT Angio Abd/Pel w/ and/or w/o  Result Date: 03/21/2023 CLINICAL DATA:  Abdominal aortic aneurysm (AAA), follow up aortic thrombus EXAM: CTA ABDOMEN AND PELVIS WITHOUT AND WITH CONTRAST TECHNIQUE: Multidetector CT imaging of the abdomen and pelvis was performed using the standard protocol during bolus administration of intravenous contrast. Multiplanar reconstructed images and MIPs were obtained and reviewed to evaluate the vascular anatomy. RADIATION DOSE REDUCTION: This exam was performed  according to the departmental dose-optimization program which includes automated exposure control, adjustment of the mA and/or kV according to patient size and/or use of iterative reconstruction technique. CONTRAST:  OMNIPAQUE IOHEXOL 350 MG/ML SOLN COMPARISON:  CTA chest dated same day, CTA abdomen pelvis March 19, 2023 FINDINGS: VASCULAR Aorta: The abdominal aorta is normal in caliber scattered atherosclerotic disease, again seen are 2 foci of wall adherent thrombus, in the descending thoracic aorta measuring 8 x 7 mm, and in the infrarenal abdominal aorta measuring 8 x 5 mm. Overall dimensions are similar to previous exam. No new filling defects identified. Celiac: Patent without evidence of thromboembolic disease. SMA: Patent without evidence of thromboembolic disease. IMA: Patent. Renals: Both renal arteries are patent without evidence of thromboembolic disease. Inflow: Visualized iliofemoral vessels are patent without findings of thromboembolic disease. Veins: No obvious venous abnormality within the limitations of this arterial phase study. NON-VASCULAR Inferior chest: New consolidation in the right lower lobe, compatible with pneumonia and more completely evaluated on same day CTA chest. Hepatobiliary: The liver is normal in size without focal abnormality. No intrahepatic or extrahepatic biliary ductal dilation. The gallbladder appears normal. Spleen: Appears small, otherwise without focal abnormality. Pancreas: No pancreatic ductal dilatation or surrounding inflammatory changes. Adrenals/Urinary Tract: Adrenal glands are unremarkable. Kidneys are normal, without renal calculi, focal lesion, or hydronephrosis. Bladder is unremarkable. Stomach/Bowel: The stomach, small bowel and large bowel are normal in caliber without abnormal wall thickening or surrounding inflammatory changes. Reproductive: Prostate is unremarkable. Lymphatic: No enlarged lymph nodes in the abdomen or pelvis. Other: Mild volume  ascites in the dependent portion of the pelvis. Musculoskeletal: No aggressive osseous lesions. The soft tissues are unremarkable. IMPRESSION: VASCULAR 1. Again seen are similar filling defects of the descending thoracic aorta and infrarenal abdominal aorta measuring up to 8 mm, not significantly changed in size or appearance. Findings could represent either in Situ thrombus or thromboembolic disease. No new filling defects of the aorta or its branch vessels are identified on this exam. NON-VASCULAR 1. New consolidation of the right  lower lobe, more completely evaluated on same day CTA chest, suggestive of infectious pneumonia. 2. Mild volume ascites in the dependent portion of the pelvis. Electronically Signed   By: Olive Bass M.D.   On: 03/21/2023 12:08   CT Angio Chest Pulmonary Embolism (PE) W or WO Contrast  Result Date: 03/21/2023 CLINICAL DATA:  Pulmonary embolism suspected, high probability. Abdominal aortic thrombus. EXAM: CT ANGIOGRAPHY CHEST WITH CONTRAST TECHNIQUE: Multidetector CT imaging of the chest was performed using the standard protocol during bolus administration of intravenous contrast. Multiplanar CT image reconstructions and MIPs were obtained to evaluate the vascular anatomy. RADIATION DOSE REDUCTION: This exam was performed according to the departmental dose-optimization program which includes automated exposure control, adjustment of the mA and/or kV according to patient size and/or use of iterative reconstruction technique. CONTRAST:  OMNIPAQUE IOHEXOL 350 MG/ML SOLN COMPARISON:  One-view chest x-ray 03/19/2023. FINDINGS: Cardiovascular: The heart size is normal. No significant pericardial effusion is present. Aortic arch and great vessel origins are within normal limits. Nonocclusive thrombus is noted along the left wall of the descending thoracic aorta near the diaphragm. Pulmonary artery opacification is excellent. No focal filling defects are present to suggest pulmonary  embolus. Mediastinum/Nodes: No enlarged mediastinal, hilar, or axillary lymph nodes. Thyroid gland, trachea, and esophagus demonstrate no significant findings. Lungs/Pleura: Centrilobular and paraseptal emphysematous changes are noted bilaterally. A large pleural globe is present anterior to the mediastinum adjacent to the left upper lobe. Focal airspace opacity is present in the medial right lower lobe. Mild dependent atelectasis is noted bilaterally. No other focal airspace disease is present. No nodule or mass lesion is present. No significant pleural effusion or pneumothorax is present. Upper Abdomen: Limited imaging the abdomen is unremarkable. There is no significant adenopathy. No solid organ lesions are present. Musculoskeletal: Vertebral body heights and alignment are normal. No focal osseous lesions are present. The ribs are unremarkable. Review of the MIP images confirms the above findings. IMPRESSION: 1. No pulmonary embolus. 2. Nonocclusive thrombus along the left wall of the descending thoracic aorta near the diaphragm. 3. Focal airspace opacity in the medial right lower lobe concerning for pneumonia. 4.  Emphysema (ICD10-J43.9). Electronically Signed   By: Marin Roberts M.D.   On: 03/21/2023 11:44   ECHOCARDIOGRAM COMPLETE  Result Date: 03/20/2023    ECHOCARDIOGRAM REPORT   Patient Name:   Tom Anderson Date of Exam: 03/20/2023 Medical Rec #:  [id number]     Height:       69.0 in Accession #:    [id number]    Weight:       117.1 lb Date of Birth:  Aug 13, 1971     BSA:          1.645 m Patient Age:    51 years      BP:           105/76 mmHg Patient Gender: M             HR:           85 bpm. Exam Location:  Inpatient Procedure: 2D Echo, Cardiac Doppler and Color Doppler                                 MODIFIED REPORT:     This report was modified by Riley Lam MD on 03/20/2023 due to  Discussed with primary team.  Indications:     Thrombus  History:          Patient has no prior history of Echocardiogram examinations.                  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  Sonographer:     Dondra Prader RVT RCS Referring Phys:  [id number] Beulah Gandy PAYNE Diagnosing Phys: Riley Lam MD  Sonographer Comments: Image acquisition challenging due to patient body habitus and Image acquisition challenging due to uncooperative patient. Patient would not lay still; patient had restraints on IMPRESSIONS  1. Left ventricular ejection fraction, by estimation, is 45 to 50%. The left ventricle has mildly decreased function. The left ventricle has no regional wall motion abnormalities. There is mild concentric left ventricular hypertrophy. Left ventricular diastolic parameters are consistent with Grade I diastolic dysfunction (impaired relaxation).  2. No LV apical thrombus.  3. RV apical hypokinesis. Right ventricular systolic function is mildly reduced. The right ventricular size is normal.  4. RV Mconnell's Sign  5. A small pericardial effusion is present. The pericardial effusion is anterior to the right ventricle.  6. The mitral valve is normal in structure. Mild mitral valve regurgitation. No evidence of mitral stenosis.  7. The aortic valve is tricuspid. There is mild thickening of the aortic valve. Aortic valve regurgitation is not visualized.  8. Echodensity seen in descending aorta-in clinical correlation to CT consistent with thrombus.  9. The inferior vena cava is normal in size with greater than 50% respiratory variability, suggesting right atrial pressure of 3 mmHg. Comparison(s): No prior Echocardiogram. Conclusion(s)/Recommendation(s): Consider CTPE if clinically indicated. Discussed with primary team; caridology to consult. FINDINGS  Left Ventricle: Left ventricular ejection fraction, by estimation, is 45 to 50%. The left ventricle has mildly decreased function. The left ventricle has no regional wall motion abnormalities. The left ventricular internal  cavity size was normal in size. There is mild concentric left ventricular hypertrophy. Left ventricular diastolic parameters are consistent with Grade I diastolic dysfunction (impaired relaxation).  LV Wall Scoring: The apical inferior segment is hypokinetic. Right Ventricle: RV apical hypokinesis. The right ventricular size is normal. No increase in right ventricular wall thickness. Right ventricular systolic function is mildly reduced. Left Atrium: Left atrial size was normal in size. Right Atrium: Right atrial size was normal in size. Pericardium: A small pericardial effusion is present. The pericardial effusion is anterior to the right ventricle. Mitral Valve: The mitral valve is normal in structure. Mild mitral valve regurgitation. No evidence of mitral valve stenosis. Tricuspid Valve: The tricuspid valve is normal in structure. Tricuspid valve regurgitation is not demonstrated. No evidence of tricuspid stenosis. Aortic Valve: The aortic valve is tricuspid. There is mild thickening of the aortic valve. Aortic valve regurgitation is not visualized. Aortic valve mean gradient measures 1.0 mmHg. Aortic valve peak gradient measures 2.0 mmHg. Aortic valve area, by VTI  measures 3.35 cm. Pulmonic Valve: The pulmonic valve was normal in structure. Pulmonic valve regurgitation is not visualized. No evidence of pulmonic stenosis. Aorta: Echodensity seen in descending aorta-in clinical correlation to CT consistent with thrombus. The aortic root and ascending aorta are structurally normal, with no evidence of dilitation. Venous: The inferior vena cava is normal in size with greater than 50% respiratory variability, suggesting right atrial pressure of 3 mmHg. IAS/Shunts: No atrial level shunt detected by color flow Doppler.  LEFT VENTRICLE PLAX 2D LVIDd:         4.20 cm  Diastology LVIDs:         2.40 cm   LV e' medial:    7.94 cm/s LV PW:         1.00 cm   LV E/e' medial:  5.5 LV IVS:        1.10 cm   LV e' lateral:    11.30 cm/s LVOT diam:     2.20 cm   LV E/e' lateral: 3.9 LV SV:         37 LV SV Index:   22 LVOT Area:     3.80 cm  RIGHT VENTRICLE             IVC RV Basal diam:  2.60 cm     IVC diam: 1.70 cm RV Mid diam:    2.20 cm RV S prime:     12.10 cm/s TAPSE (M-mode): 1.8 cm LEFT ATRIUM           Index        RIGHT ATRIUM          Index LA diam:      2.80 cm 1.70 cm/m   RA Area:     8.34 cm LA Vol (A2C): 23.2 ml 14.10 ml/m  RA Volume:   17.53 ml 10.65 ml/m LA Vol (A4C): 18.0 ml 10.97 ml/m  AORTIC VALVE                    PULMONIC VALVE AV Area (Vmax):    3.35 cm     PV Vmax:       0.76 m/s AV Area (Vmean):   3.13 cm     PV Peak grad:  2.3 mmHg AV Area (VTI):     3.35 cm AV Vmax:           70.90 cm/s AV Vmean:          48.100 cm/s AV VTI:            0.110 m AV Peak Grad:      2.0 mmHg AV Mean Grad:      1.0 mmHg LVOT Vmax:         62.50 cm/s LVOT Vmean:        39.600 cm/s LVOT VTI:          0.097 m LVOT/AV VTI ratio: 0.88  AORTA Ao Root diam: 3.30 cm Ao Asc diam:  3.00 cm MITRAL VALVE MV Area (PHT): 3.40 cm    SHUNTS MV Decel Time: 223 msec    Systemic VTI:  0.10 m MV E velocity: 43.70 cm/s  Systemic Diam: 2.20 cm MV A velocity: 46.00 cm/s MV E/A ratio:  0.95 Riley Lam MD Electronically signed by Riley Lam MD Signature Date/Time: 03/20/2023/10:05:35 AM    Final (Updated)     Cardiac Studies   TTE 03/20/2023 IMPRESSIONS   1. Left ventricular ejection fraction, by estimation, is 45 to 50%. The  left ventricle has mildly decreased function. The left ventricle has no  regional wall motion abnormalities. There is mild concentric left  ventricular hypertrophy. Left ventricular  diastolic parameters are consistent with Grade I diastolic dysfunction  (impaired relaxation).   2. No LV apical thrombus.   3. RV apical hypokinesis. Right ventricular systolic function is mildly  reduced. The right ventricular size is normal.   4. RV Mconnell's Sign   5. A small pericardial effusion is  present. The pericardial effusion is  anterior to the right ventricle.   6. The mitral  valve is normal in structure. Mild mitral valve  regurgitation. No evidence of mitral stenosis.   7. The aortic valve is tricuspid. There is mild thickening of the aortic  valve. Aortic valve regurgitation is not visualized.   8. Echodensity seen in descending aorta-in clinical correlation to CT  consistent with thrombus.   9. The inferior vena cava is normal in size with greater than 50%  respiratory variability, suggesting right atrial pressure of 3 mmHg.   Comparison(s): No prior Echocardiogram.   Conclusion(s)/Recommendation(s): Consider CTPE if clinically indicated.  Discussed with primary team; caridology to consult.   FINDINGS   Left Ventricle: Left ventricular ejection fraction, by estimation, is 45  to 50%. The left ventricle has mildly decreased function. The left  ventricle has no regional wall motion abnormalities. The left ventricular  internal cavity size was normal in  size. There is mild concentric left ventricular hypertrophy. Left  ventricular diastolic parameters are consistent with Grade I diastolic  dysfunction (impaired relaxation).     LV Wall Scoring:  The apical inferior segment is hypokinetic.   Right Ventricle: RV apical hypokinesis. The right ventricular size is  normal. No increase in right ventricular wall thickness. Right ventricular  systolic function is mildly reduced.   Left Atrium: Left atrial size was normal in size.   Right Atrium: Right atrial size was normal in size.   Pericardium: A small pericardial effusion is present. The pericardial  effusion is anterior to the right ventricle.   Mitral Valve: The mitral valve is normal in structure. Mild mitral valve  regurgitation. No evidence of mitral valve stenosis.   Tricuspid Valve: The tricuspid valve is normal in structure. Tricuspid  valve regurgitation is not demonstrated. No evidence of tricuspid   stenosis.   Aortic Valve: The aortic valve is tricuspid. There is mild thickening of  the aortic valve. Aortic valve regurgitation is not visualized. Aortic  valve mean gradient measures 1.0 mmHg. Aortic valve peak gradient measures  2.0 mmHg. Aortic valve area, by VTI   measures 3.35 cm.   Pulmonic Valve: The pulmonic valve was normal in structure. Pulmonic valve  regurgitation is not visualized. No evidence of pulmonic stenosis.   Aorta: Echodensity seen in descending aorta-in clinical correlation to CT  consistent with thrombus. The aortic root and ascending aorta are  structurally normal, with no evidence of dilitation.   Venous: The inferior vena cava is normal in size with greater than 50%  respiratory variability, suggesting right atrial pressure of 3 mmHg.   IAS/Shunts: No atrial level shunt detected by color flow Doppler.    Patient Profile     52 y.o. 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  He is currently on heparin drip for the aortic thrombus.  Agree that he can be transition to DOAC.  For him given his other comorbidities Xarelto/once a day dosing medication will be the best option.  No need for any further cardiovascular testing at this time.  Will need outpatient follow-up for repeat echocardiogram.     For questions or updates, please contact [facility] Please consult [email or web address] for contact info under Cardiology/STEMI.  Signed, Thomasene Ripple, DO  03/22/2023, 9:03 AM

## 2023-03-22 NOTE — TOC Benefit Eligibility Note (Signed)
Patient Advocate Encounter  Insurance verification completed.    The patient is currently admitted and upon discharge could be taking Eliquis 5 mg.  The current 30 day co-pay is $4.00.   The patient is currently admitted and upon discharge could be taking Xarelto 20 mg.  The current 30 day co-pay is $4.00.   The patient is insured through [place] Medicaid   This test claim was processed through [facility]- copay amounts may vary at other pharmacies due to pharmacy/plan contracts, or as the patient moves through the different stages of their insurance plan.  Leili Eskenazi, CPHT Pharmacy Patient Advocate Specialist [facility] Patient Advocate Team Direct Number: [phone]  Fax: [phone]       

## 2023-03-22 NOTE — Discharge Instructions (Addendum)
Information on my medicine - XARELTO (rivaroxaban)  WHY WAS XARELTO PRESCRIBED FOR YOU? Xarelto was prescribed to treat blood clots that may have been found in the veins of your legs (deep vein thrombosis) or in your lungs (pulmonary embolism) and to reduce the risk of them occurring again.  What do you need to know about Xarelto? The starting dose is one 15 mg tablet taken TWICE daily with food for the FIRST 21 DAYS then on (Monday May 13th, 2024)  04/12/2023  the dose is changed to one 20 mg tablet taken ONCE A DAY with your evening meal.  DO NOT stop taking Xarelto without talking to the health care provider who prescribed the medication.  Refill your prescription for 20 mg tablets before you run out.  After discharge, you should have regular check-up appointments with your healthcare provider that is prescribing your Xarelto.  In the future your dose may need to be changed if your kidney function changes by a significant amount.  What do you do if you miss a dose? If you are taking Xarelto TWICE DAILY and you miss a dose, take it as soon as you remember. You may take two 15 mg tablets (total 30 mg) at the same time then resume your regularly scheduled 15 mg twice daily the next day.  If you are taking Xarelto ONCE DAILY and you miss a dose, take it as soon as you remember on the same day then continue your regularly scheduled once daily regimen the next day. Do not take two doses of Xarelto at the same time.   Important Safety Information Xarelto is a blood thinner medicine that can cause bleeding. You should call your healthcare provider right away if you experience any of the following: Bleeding from an injury or your nose that does not stop. Unusual colored urine (red or dark brown) or unusual colored stools (red or black). Unusual bruising for unknown reasons. A serious fall or if you hit your head (even if there is no bleeding).  Some medicines may interact with Xarelto and  might increase your risk of bleeding while on Xarelto. To help avoid this, consult your healthcare provider or pharmacist prior to using any new prescription or non-prescription medications, including herbals, vitamins, non-steroidal anti-inflammatory drugs (NSAIDs) and supplements.  This website has more information on Xarelto: [email or web address].br.

## 2023-03-22 NOTE — Progress Notes (Addendum)
[facility]                                                                              Tom Anderson, is a 52 y.o. male, DOB - December 18, 1970, ZOX:[id number] Admit date - 03/19/2023    Outpatient Primary MD for the patient is Montez Hageman, DO  LOS - 3  days  Chief Complaint  Patient presents with   Weakness   Hyperglycemia       Brief summary   Patient is a 52 year old male with diabetes mellitus type 1, schizophrenia, asthma, depression, tobacco use, recurrent admissions for DKA brought in by his parents after he stopped taking his insulin several days prior to admission.  Patient was found to be in DKA and confused.  His mother reported that he stops taking his insulin without any clear reason.  He was not eating and was lethargic on admission. In ED, glucose> 1200, bicarb less than 7, anion gap not calculated, creatinine 2.4,AST 96, ALT 250, bili 1.7, BHB >8, pH 6.89. Given his encephalopathy and severity of DKA, patient was admitted to ICU by CCM.  Significant Hospital Events:   4/19 admit with DKA 4/19 CT abdomen/pelvis -small nonocclusive emboli in the lower thoracic and distal abdominal aorta>> vascular consult, started IV heparin , 4/20 transition off insulin at 6:30 AM  , 4/20 echo with hypokinetic LV, McConnell sign RV cardiology consulted.  Psychiatry also consulted. 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 -CT abdomen pelvis  showed small nonocclusive emboli in the lower thoracic and distal abdominal aorta -Vascular surgery was consulted and patient was placed on IV heparin drip -Repeat CTA chest abdomen pelvis showed new consolidation in the right lower lobe, suggestive of infectious pneumonia, mild volume ascites, similar filling defect of the descending thoracic aorta and infrarenal abdominal aorta 8 mm -Per vascular surgery, patient will require 3 months of anticoagulation  Abnormal RV apex, McConnell's sign and LV apical hypokinesis  chronic HFrEF (heart failure with reduced ejection fraction), EF 45 to 50% -CTA chest showed no PE, +nonocclusive thrombus along the left wall of the descending thoracic aorta near the diaphragm -2D echo showed for 45 to 50%, G1 DD, mild concentric LVH, RV apical hypokinesis, mildly reduced RV SF,RV McConnell's sign -Cardiology following, recommended transitioning to DOAC -No further cardiovascular testing inpatient.  Will need outpatient follow-up for repeat echo  Right lower lobe pneumonia -CTA chest showed consolidation in the right lower lobe?  Aspiration -Currently no fevers, leukocytosis or respiratory symptoms -Hold off on antibiotics for now, if develops any fever, will place on oral Augmentin    Schizophrenia, major depressive disorder, SI  -  Patient expressed suicidal thoughts during hospitalization  -Psychiatry following, continue sitter, likely will need IVC  Acute kidney injury with dehydration, lactic acidosis -Lactic acid> 9.0 on admission -Creatinine 2.44 on admission, creatinine was 0.6 on 02/12/2023 likely due to #1 -Placed on aggressive IV fluid hydration, IV insulin drip. -Creatinine improved, 0.8  Hyponatremia -Sodium 119 on admission likely due to dehydration and pseudohyponatremia due to hyperglycemia -Sodium improving, 128   Transaminitis -Has history of etoh abuse, stopped 4 months ago - LFTs elevated on admission, likely due to hypotension and  #1 -Abdominal ultrasound showed no cholelithiasis or acute cholecystitis.  Increased hepatic echogenicity seen with hepatic steatosis -Improving  Acute metabolic encephalopathy -Likely due to #1, resolved, - currently alert and oriented, appears close to his baseline    Moderate cannabis use disorder - Counseled on cessation   Underweight, moderate protein calorie malnutrition, hypoalbuminemia, albumin 2.8 Estimated body mass index is 17.06 kg/m as calculated from the following:   Height as of 02/09/23: 5\' 9"  (1.753 m).   Weight as of this encounter: 52.4 kg.  Code Status: Full code DVT Prophylaxis:  SCDs Start: 03/19/23 1112   Level of Care: Level of care: Med-Surg Family Communication: Updated patient Disposition Plan:      Remains inpatient appropriate:      Procedures:  CTA chest abdomen pelvis 2D echo  Consultants:   Admitted by [facility] Cardiology Vascular surgery Psychiatry  Antimicrobials:   Anti-infectives (From admission, onward)    Start     Dose/Rate Route Frequency Ordered Stop   03/20/23 1200  vancomycin (VANCOCIN) IVPB 1000 mg/200 mL premix  Status:  Discontinued        1,000 mg 200 mL/hr over 60 Minutes Intravenous Every 24 hours 03/19/23 1144 03/20/23 0908   03/19/23 1130  ceFEPIme (MAXIPIME) 2 g in sodium chloride 0.9 % 100 mL IVPB  Status:  Discontinued        2 g 200 mL/hr over 30 Minutes Intravenous Every 12 hours 03/19/23 1115 03/20/23 0908   03/19/23 1130  vancomycin (VANCOREADY) IVPB 1250 mg/250 mL        1,250 mg 166.7 mL/hr over 90 Minutes Intravenous  Once 03/19/23 1115 03/19/23 1352   03/19/23 1115  metroNIDAZOLE (FLAGYL) IVPB 500 mg        500 mg 100 mL/hr over 60 Minutes Intravenous  Once 03/19/23 1100 03/19/23 1304          Medications  ARIPiprazole  10 mg Oral Daily   aspirin  81 mg Oral Daily   folic acid  1 mg Oral Daily   insulin aspart  0-15 Units Subcutaneous Q4H   multivitamin with minerals  1 tablet Oral Daily    ramelteon  8 mg Oral QHS   thiamine  100 mg Oral Daily      Subjective:   Tom Anderson was seen and examined today.  No acute complaints, asking to go home.  No acute chest pain, shortness of breath nausea or vomiting.  No fevers.  Transitioned to subcu insulin.  No acute events overnight.   Objective:   Vitals:   03/22/23 0749 03/22/23 0800 03/22/23 0900 03/22/23 1000  BP:  112/85 116/76 (!) 134/95  Pulse: 87 90 86 89  Resp: 16 15 (!) 23 (!) 22  Temp: 98.2 F (36.8 C)     TempSrc: Oral     SpO2: 100% 100% 99% 100%  Weight:        Intake/Output Summary (Last 24 hours) at 03/22/2023 1035 Last  data filed at 03/22/2023 1000 Gross per 24 hour  Intake 3619.72 ml  Output 6000 ml  Net -2380.28 ml     Wt Readings from Last 3 Encounters:  03/22/23 52.4 kg  02/12/23 54.1 kg  01/12/23 55.3 kg     Exam General: Alert and oriented x 3, NAD Cardiovascular: S1 S2 auscultated,  RRR Respiratory: CTAB Gastrointestinal: Soft, nontender, nondistended, + bowel sounds Ext: no pedal edema bilaterally Neuro: Strength 5/5 upper and lower extremities bilaterally Psych: Normal affect     Data Reviewed:  I have personally reviewed following labs    CBC Lab Results  Component Value Date   WBC 8.0 03/22/2023   RBC 3.01 (L) 03/22/2023   HGB 10.1 (L) 03/22/2023   HCT 29.5 (L) 03/22/2023   MCV 98.0 03/22/2023   MCH 33.6 03/22/2023   PLT 174 03/22/2023   MCHC 34.2 03/22/2023   RDW 12.8 03/22/2023   LYMPHSABS 2.3 03/21/2023   MONOABS 0.4 03/21/2023   EOSABS 0.1 03/21/2023   BASOSABS 0.0 03/21/2023     Last metabolic panel Lab Results  Component Value Date   NA 128 (L) 03/22/2023   K 3.7 03/22/2023   CL 92 (L) 03/22/2023   CO2 26 03/22/2023   BUN 11 03/22/2023   CREATININE 0.81 03/22/2023   GLUCOSE 146 (H) 03/22/2023   GFRNONAA >60 03/22/2023   CALCIUM 8.2 (L) 03/22/2023   PHOS 3.7 03/22/2023   PROT 4.8 (L) 03/20/2023   ALBUMIN 2.8 (L) 03/20/2023   BILITOT 0.6  03/20/2023   ALKPHOS 177 (H) 03/20/2023   AST 61 (H) 03/20/2023   ALT 153 (H) 03/20/2023   ANIONGAP 10 03/22/2023    CBG (last 3)  Recent Labs    03/22/23 0355 03/22/23 0623 03/22/23 0817  GLUCAP 158* 83 249*      Coagulation Profile: No results for input(s): "INR", "PROTIME" in the last 168 hours.   Radiology Studies: I have personally reviewed the imaging studies  CT Angio Abd/Pel w/ and/or w/o  Result Date: 03/21/2023 CLINICAL DATA:  Abdominal aortic aneurysm (AAA), follow up aortic thrombus EXAM: CTA ABDOMEN AND PELVIS WITHOUT AND WITH CONTRAST TECHNIQUE: Multidetector CT imaging of the abdomen and pelvis was performed using the standard protocol during bolus administration of intravenous contrast. Multiplanar reconstructed images and MIPs were obtained and reviewed to evaluate the vascular anatomy. RADIATION DOSE REDUCTION: This exam was performed according to the departmental dose-optimization program which includes automated exposure control, adjustment of the mA and/or kV according to patient size and/or use of iterative reconstruction technique. CONTRAST:  OMNIPAQUE IOHEXOL 350 MG/ML SOLN COMPARISON:  CTA chest dated same day, CTA abdomen pelvis March 19, 2023 FINDINGS: VASCULAR Aorta: The abdominal aorta is normal in caliber scattered atherosclerotic disease, again seen are 2 foci of wall adherent thrombus, in the descending thoracic aorta measuring 8 x 7 mm, and in the infrarenal abdominal aorta measuring 8 x 5 mm. Overall dimensions are similar to previous exam. No new filling defects identified. Celiac: Patent without evidence of thromboembolic disease. SMA: Patent without evidence of thromboembolic disease. IMA: Patent. Renals: Both renal arteries are patent without evidence of thromboembolic disease. Inflow: Visualized iliofemoral vessels are patent without findings of thromboembolic disease. Veins: No obvious venous abnormality within the limitations of this arterial  phase study. NON-VASCULAR Inferior chest: New consolidation in the right lower lobe, compatible with pneumonia and more completely evaluated on same day CTA chest. Hepatobiliary: The liver is normal in size without focal abnormality. No intrahepatic or  extrahepatic biliary ductal dilation. The gallbladder appears normal. Spleen: Appears small, otherwise without focal abnormality. Pancreas: No pancreatic ductal dilatation or surrounding inflammatory changes. Adrenals/Urinary Tract: Adrenal glands are unremarkable. Kidneys are normal, without renal calculi, focal lesion, or hydronephrosis. Bladder is unremarkable. Stomach/Bowel: The stomach, small bowel and large bowel are normal in caliber without abnormal wall thickening or surrounding inflammatory changes. Reproductive: Prostate is unremarkable. Lymphatic: No enlarged lymph nodes in the abdomen or pelvis. Other: Mild volume ascites in the dependent portion of the pelvis. Musculoskeletal: No aggressive osseous lesions. The soft tissues are unremarkable. IMPRESSION: VASCULAR 1. Again seen are similar filling defects of the descending thoracic aorta and infrarenal abdominal aorta measuring up to 8 mm, not significantly changed in size or appearance. Findings could represent either in Situ thrombus or thromboembolic disease. No new filling defects of the aorta or its branch vessels are identified on this exam. NON-VASCULAR 1. New consolidation of the right lower lobe, more completely evaluated on same day CTA chest, suggestive of infectious pneumonia. 2. Mild volume ascites in the dependent portion of the pelvis. Electronically Signed   By: Olive Bass M.D.   On: 03/21/2023 12:08   CT Angio Chest Pulmonary Embolism (PE) W or WO Contrast  Result Date: 03/21/2023 CLINICAL DATA:  Pulmonary embolism suspected, high probability. Abdominal aortic thrombus. EXAM: CT ANGIOGRAPHY CHEST WITH CONTRAST TECHNIQUE: Multidetector CT imaging of the chest was performed using the  standard protocol during bolus administration of intravenous contrast. Multiplanar CT image reconstructions and MIPs were obtained to evaluate the vascular anatomy. RADIATION DOSE REDUCTION: This exam was performed according to the departmental dose-optimization program which includes automated exposure control, adjustment of the mA and/or kV according to patient size and/or use of iterative reconstruction technique. CONTRAST:  OMNIPAQUE IOHEXOL 350 MG/ML SOLN COMPARISON:  One-view chest x-ray 03/19/2023. FINDINGS: Cardiovascular: The heart size is normal. No significant pericardial effusion is present. Aortic arch and great vessel origins are within normal limits. Nonocclusive thrombus is noted along the left wall of the descending thoracic aorta near the diaphragm. Pulmonary artery opacification is excellent. No focal filling defects are present to suggest pulmonary embolus. Mediastinum/Nodes: No enlarged mediastinal, hilar, or axillary lymph nodes. Thyroid gland, trachea, and esophagus demonstrate no significant findings. Lungs/Pleura: Centrilobular and paraseptal emphysematous changes are noted bilaterally. A large pleural globe is present anterior to the mediastinum adjacent to the left upper lobe. Focal airspace opacity is present in the medial right lower lobe. Mild dependent atelectasis is noted bilaterally. No other focal airspace disease is present. No nodule or mass lesion is present. No significant pleural effusion or pneumothorax is present. Upper Abdomen: Limited imaging the abdomen is unremarkable. There is no significant adenopathy. No solid organ lesions are present. Musculoskeletal: Vertebral body heights and alignment are normal. No focal osseous lesions are present. The ribs are unremarkable. Review of the MIP images confirms the above findings. IMPRESSION: 1. No pulmonary embolus. 2. Nonocclusive thrombus along the left wall of the descending thoracic aorta near the diaphragm. 3. Focal  airspace opacity in the medial right lower lobe concerning for pneumonia. 4.  Emphysema (ICD10-J43.9). Electronically Signed   By: Marin Roberts M.D.   On: 03/21/2023 11:44       Neiva Maenza M.D. [facility] 03/22/2023, 10:35 AM  Available via Epic secure chat 7am-7pm After 7 pm, please refer to night coverage provider listed on amion.

## 2023-03-22 NOTE — Progress Notes (Signed)
CSW informed that parents asking about mental health resources.  CSW spoke with pt mother Tom Anderson, who also had questions about DC plan.  Pt is currently followed at [facility] for outpt psychiatry.  Mother reports his mental health has not been problematic recently.  Discussed that [facility] would be able to refer to other levels of care if needed.   Daleen Squibb, MSW, LCSW 4/22/20242:24 PM

## 2023-03-22 NOTE — Consult Note (Signed)
[facility] Face-to-Face Psychiatry Consult   Reason for Consult:'' Expressed suicidal thoughts and want to sign out AMA-re-assess for Capacity.''  Referring Physician:  Cyril Mourning, MD Patient Identification: Tom Anderson MRN:  [id number] Principal Diagnosis: DKA (diabetic ketoacidosis) Diagnosis:  Principal Problem:   DKA (diabetic ketoacidosis) Active Problems:   Schizophrenia   Moderate cannabis use disorder   Major depressive disorder, recurrent episode, mild   Thrombus   HFrEF (heart failure with reduced ejection fraction)   Total Time spent with patient: 30 hour  Subjective: '' I want to be discharged home right now, I think I am fine to go home.''  Objective: Patient seen, chart reviewed and case discussed with Dr. Gasper Sells for this face-to-face psychiatric consultation and evaluation of suicidal ideation and capacity. Patient appeared lying on his bed, somewhat sleepy during my evaluation. Patient is awake and eating lunch; willing and able to participate. Patient reports that he has been having a lot of disagreements at hospital that result in him getting agitated.He denies any behavioral disturbances and or agitation issues while in the hospital, although he did make some suicidal statements " that I didn't mean at all. I just said something I shouldn't have said and they took me serious. "  Chart review seems to be consistent with his reports.  " I did apologize right after I said it. " He adamantly denies being suicidal and or threatening anyone, even though he is very irritable.  Patient is loud and boisterous, which appears to be his baseline.  He denies any homicidal ideations, violent tendencies, aggressive, and or combative behavior at his dialysis clinic and or during the hospitalization.  He denies any current and or pending legal charges and or substance use.  He does admit to smoking marijuana for management of pain, this is chronic and has been ongoing for quite time.  Patient  reported he has been cooperative and taking his medication. .    Patient denied history of depression, anxiety, suicidal/homicidal ideation and has no evidence of psychosis.  He reports one isolated suicide attempt at age of 52, that did not require inpatient hospitalization. Patient continues to deny any current suicidal ideations, homicidal ideations, and or auditory or visual hallucinations.  He does not appear to be responding to internal stimuli, external stimuli, and or displaying delusional thought disorder.  Patients current symptoms do not reflect the need for ongoing inpatient psychiatric services or inpatient psychiatric hospitalization. Patient will benefit from change of environment, higher level of care, assistance with psychosocial stressors. Psychiatry will sign off at this time.      Past Psychiatric History: Depression and anxiety. Reports history of one previous suicide attempt. He denies any recent inpatient or outpatient psychiatric services at this time. He reports use of marijuana, and denies any other illicit substances at this time. He denies any legal charges, probation or parole at this time.   Risk to Self:  denies Risk to Others:  denies Prior Inpatient Therapy:  in the past Prior Outpatient Therapy:  [facility]  Past Medical History:  Past Medical History:  Diagnosis Date   Asthma    Depression    Diabetes mellitus without complication (HCC)    Schizophrenia (HCC)     Past Surgical History:  Procedure Laterality Date   HERNIA REPAIR     Family History:  Family History  Problem Relation Age of Onset   Diabetes Maternal Grandfather    Family Psychiatric  History:   Social History:  Social History  Substance and Sexual Activity  Alcohol Use Yes   Comment: daily  84 oz per day     Social History   Substance and Sexual Activity  Drug Use Yes   Types: Marijuana    Social History   Socioeconomic History   Marital status: Single    Spouse name: Not  on file   Number of children: Not on file   Years of education: Not on file   Highest education level: Not on file  Occupational History   Not on file  Tobacco Use   Smoking status: Every Day    Packs/day: 2.50    Years: 0.00    Additional pack years: 0.00    Total pack years: 0.00    Types: Cigarettes   Smokeless tobacco: Never  Vaping Use   Vaping Use: Never used  Substance and Sexual Activity   Alcohol use: Yes    Comment: daily  84 oz per day   Drug use: Yes    Types: Marijuana   Sexual activity: Not on file  Other Topics Concern   Not on file  Social History Narrative   Not on file   Social Determinants of Health   Financial Resource Strain: Not on file  Food Insecurity: No Food Insecurity (02/10/2023)   Hunger Vital Sign    Worried About Running Out of Food in the Last Year: Never true    Ran Out of Food in the Last Year: Never true  Transportation Needs: No Transportation Needs (02/10/2023)   PRAPARE - Administrator, Civil Service (Medical): No    Lack of Transportation (Non-Medical): No  Physical Activity: Not on file  Stress: Not on file  Social Connections: Not on file   Additional Social History:    Allergies:   Allergies  Allergen Reactions   Nitrous Oxide Nausea And Vomiting    Labs:  Results for orders placed or performed during the hospital encounter of 03/19/23 (from the past 48 hour(s))  Glucose, capillary     Status: Abnormal   Collection Time: 03/20/23  4:15 PM  Result Value Ref Range   Glucose-Capillary 43 (LL) 70 - 99 mg/dL    Comment: Glucose reference range applies only to samples taken after fasting for at least 8 hours.   Comment 1 Document in Chart   Glucose, capillary     Status: Abnormal   Collection Time: 03/20/23  4:18 PM  Result Value Ref Range   Glucose-Capillary 45 (L) 70 - 99 mg/dL    Comment: Glucose reference range applies only to samples taken after fasting for at least 8 hours.  Glucose, capillary      Status: Abnormal   Collection Time: 03/20/23  4:38 PM  Result Value Ref Range   Glucose-Capillary 206 (H) 70 - 99 mg/dL    Comment: Glucose reference range applies only to samples taken after fasting for at least 8 hours.  Glucose, capillary     Status: Abnormal   Collection Time: 03/20/23  7:23 PM  Result Value Ref Range   Glucose-Capillary 133 (H) 70 - 99 mg/dL    Comment: Glucose reference range applies only to samples taken after fasting for at least 8 hours.  Glucose, capillary     Status: None   Collection Time: 03/20/23 11:21 PM  Result Value Ref Range   Glucose-Capillary 71 70 - 99 mg/dL    Comment: Glucose reference range applies only to samples taken after fasting for at least 8 hours.  Heparin level (unfractionated)     Status: None   Collection Time: 03/21/23 12:32 AM  Result Value Ref Range   Heparin Unfractionated 0.32 0.30 - 0.70 IU/mL    Comment: (NOTE) The clinical reportable range upper limit is being lowered to >1.10 to align with the FDA approved guidance for the current laboratory assay.  If heparin results are below expected values, and patient dosage has  been confirmed, suggest follow up testing of antithrombin III levels. Performed at [facility], [address], [place], [place] [address]   CBC with Differential/Platelet     Status: Abnormal   Collection Time: 03/21/23 12:32 AM  Result Value Ref Range   WBC 9.3 4.0 - 10.5 K/uL   RBC 3.16 (L) 4.22 - 5.81 MIL/uL   Hemoglobin 10.7 (L) 13.0 - 17.0 g/dL   HCT 60.4 (L) 54.0 - 98.1 %   MCV 95.9 80.0 - 100.0 fL   MCH 33.9 26.0 - 34.0 pg   MCHC 35.3 30.0 - 36.0 g/dL   RDW 19.1 47.8 - 29.5 %   Platelets 204 150 - 400 K/uL   nRBC 0.0 0.0 - 0.2 %   Neutrophils Relative % 69 %   Neutro Abs 6.4 1.7 - 7.7 K/uL   Lymphocytes Relative 25 %   Lymphs Abs 2.3 0.7 - 4.0 K/uL   Monocytes Relative 5 %   Monocytes Absolute 0.4 0.1 - 1.0 K/uL   Eosinophils Relative 1 %   Eosinophils Absolute 0.1 0.0 - 0.5 K/uL    Basophils Relative 0 %   Basophils Absolute 0.0 0.0 - 0.1 K/uL   Immature Granulocytes 0 %   Abs Immature Granulocytes 0.02 0.00 - 0.07 K/uL    Comment: Performed at [facility], [address], [place], [place] [address]  Basic metabolic panel     Status: Abnormal   Collection Time: 03/21/23 12:32 AM  Result Value Ref Range   Sodium 132 (L) 135 - 145 mmol/L   Potassium 2.9 (L) 3.5 - 5.1 mmol/L   Chloride 95 (L) 98 - 111 mmol/L   CO2 29 22 - 32 mmol/L   Glucose, Bld 62 (L) 70 - 99 mg/dL    Comment: Glucose reference range applies only to samples taken after fasting for at least 8 hours.   BUN 7 6 - 20 mg/dL   Creatinine, Ser 8.65 0.61 - 1.24 mg/dL   Calcium 8.1 (L) 8.9 - 10.3 mg/dL   GFR, Estimated >78 >46 mL/min    Comment: (NOTE) Calculated using the CKD-EPI Creatinine Equation (2021)    Anion gap 8 5 - 15    Comment: Performed at [facility], [address], [place], [place] [address]  Magnesium     Status: None   Collection Time: 03/21/23 12:32 AM  Result Value Ref Range   Magnesium 1.9 1.7 - 2.4 mg/dL    Comment: Performed at [facility], [address] [place], [place] [address]  Phosphorus     Status: Abnormal   Collection Time: 03/21/23 12:32 AM  Result Value Ref Range   Phosphorus 1.3 (L) 2.5 - 4.6 mg/dL    Comment: Performed at [facility], [address] [place], [place] [address]  Glucose, capillary     Status: Abnormal   Collection Time: 03/21/23  3:21 AM  Result Value Ref Range   Glucose-Capillary 52 (L) 70 - 99 mg/dL    Comment: Glucose reference range applies only to samples  taken after fasting for at least 8 hours.  Glucose, capillary     Status: None   Collection Time: 03/21/23  7:35 AM  Result Value Ref Range   Glucose-Capillary 84 70 - 99 mg/dL    Comment: Glucose reference range applies only to samples taken after fasting for at least 8 hours.  Glucose, capillary     Status: Abnormal   Collection Time: 03/21/23 11:28 AM   Result Value Ref Range   Glucose-Capillary 227 (H) 70 - 99 mg/dL    Comment: Glucose reference range applies only to samples taken after fasting for at least 8 hours.  Basic metabolic panel     Status: Abnormal   Collection Time: 03/21/23 11:56 AM  Result Value Ref Range   Sodium 127 (L) 135 - 145 mmol/L   Potassium 3.8 3.5 - 5.1 mmol/L   Chloride 92 (L) 98 - 111 mmol/L   CO2 25 22 - 32 mmol/L   Glucose, Bld 232 (H) 70 - 99 mg/dL    Comment: Glucose reference range applies only to samples taken after fasting for at least 8 hours.   BUN 6 6 - 20 mg/dL   Creatinine, Ser 7.56 0.61 - 1.24 mg/dL   Calcium 7.8 (L) 8.9 - 10.3 mg/dL   GFR, Estimated >43 >32 mL/min    Comment: (NOTE) Calculated using the CKD-EPI Creatinine Equation (2021)    Anion gap 10 5 - 15    Comment: Performed at [facility], [address], [place], [place] [address]  Glucose, capillary     Status: Abnormal   Collection Time: 03/21/23  3:50 PM  Result Value Ref Range   Glucose-Capillary 354 (H) 70 - 99 mg/dL    Comment: Glucose reference range applies only to samples taken after fasting for at least 8 hours.  Glucose, capillary     Status: Abnormal   Collection Time: 03/21/23  7:29 PM  Result Value Ref Range   Glucose-Capillary 396 (H) 70 - 99 mg/dL    Comment: Glucose reference range applies only to samples taken after fasting for at least 8 hours.   Comment 1 Notify RN    Comment 2 Document in Chart   Phosphorus     Status: Abnormal   Collection Time: 03/21/23  8:31 PM  Result Value Ref Range   Phosphorus 2.2 (L) 2.5 - 4.6 mg/dL    Comment: HEMOLYSIS AT THIS LEVEL MAY AFFECT RESULT Performed at [facility], [address] [place], [place] [address]   Glucose, capillary     Status: Abnormal   Collection Time: 03/21/23  9:40 PM  Result Value Ref Range   Glucose-Capillary 236 (H) 70 - 99 mg/dL    Comment: Glucose reference range applies only to samples taken after fasting for at least 8  hours.   Comment 1 Notify RN    Comment 2 Document in Chart   Glucose, capillary     Status: Abnormal   Collection Time: 03/21/23 11:38 PM  Result Value Ref Range   Glucose-Capillary 187 (H) 70 - 99 mg/dL    Comment: Glucose reference range applies only to samples taken after fasting for at least 8 hours.  Glucose, capillary     Status: Abnormal   Collection Time: 03/22/23 12:54 AM  Result Value Ref Range   Glucose-Capillary 239 (H) 70 - 99 mg/dL    Comment: Glucose reference range applies only to samples taken after fasting for at least 8 hours.   Comment  1 Notify RN    Comment 2 Document in Chart   Glucose, capillary     Status: Abnormal   Collection Time: 03/22/23  2:00 AM  Result Value Ref Range   Glucose-Capillary 145 (H) 70 - 99 mg/dL    Comment: Glucose reference range applies only to samples taken after fasting for at least 8 hours.  CBC     Status: Abnormal   Collection Time: 03/22/23  2:05 AM  Result Value Ref Range   WBC 8.0 4.0 - 10.5 K/uL   RBC 3.01 (L) 4.22 - 5.81 MIL/uL   Hemoglobin 10.1 (L) 13.0 - 17.0 g/dL   HCT 04.5 (L) 40.9 - 81.1 %   MCV 98.0 80.0 - 100.0 fL   MCH 33.6 26.0 - 34.0 pg   MCHC 34.2 30.0 - 36.0 g/dL   RDW 91.4 78.2 - 95.6 %   Platelets 174 150 - 400 K/uL   nRBC 0.0 0.0 - 0.2 %    Comment: Performed at [facility], [address] [place], [place] [address]  Heparin level (unfractionated)     Status: Abnormal   Collection Time: 03/22/23  2:05 AM  Result Value Ref Range   Heparin Unfractionated 0.14 (L) 0.30 - 0.70 IU/mL    Comment: (NOTE) The clinical reportable range upper limit is being lowered to >1.10 to align with the FDA approved guidance for the current laboratory assay.  If heparin results are below expected values, and patient dosage has  been confirmed, suggest follow up testing of antithrombin III levels. Performed at [facility], [address], [place], [place] [address]   Basic metabolic panel     Status:  Abnormal   Collection Time: 03/22/23  2:05 AM  Result Value Ref Range   Sodium 128 (L) 135 - 145 mmol/L   Potassium 3.7 3.5 - 5.1 mmol/L   Chloride 92 (L) 98 - 111 mmol/L   CO2 26 22 - 32 mmol/L   Glucose, Bld 146 (H) 70 - 99 mg/dL    Comment: Glucose reference range applies only to samples taken after fasting for at least 8 hours.   BUN 11 6 - 20 mg/dL   Creatinine, Ser 6.96 0.61 - 1.24 mg/dL   Calcium 8.2 (L) 8.9 - 10.3 mg/dL   GFR, Estimated >29 >52 mL/min    Comment: (NOTE) Calculated using the CKD-EPI Creatinine Equation (2021)    Anion gap 10 5 - 15    Comment: Performed at [facility], [address] [place], [place] [address]  Magnesium     Status: None   Collection Time: 03/22/23  2:05 AM  Result Value Ref Range   Magnesium 2.2 1.7 - 2.4 mg/dL    Comment: Performed at [facility], [address] [place], [place] [address]  Phosphorus     Status: None   Collection Time: 03/22/23  2:05 AM  Result Value Ref Range   Phosphorus 3.7 2.5 - 4.6 mg/dL    Comment: Performed at [facility], [address], [place], [place] [address]  Hemoglobin A1c     Status: Abnormal   Collection Time: 03/22/23  2:05 AM  Result Value Ref Range   Hgb A1c MFr Bld 14.2 (H) 4.8 - 5.6 %    Comment: (NOTE) Pre diabetes:          5.7%-6.4%  Diabetes:              >6.4%  Glycemic  control for   <7.0% adults with diabetes    Mean Plasma Glucose 360.84 mg/dL    Comment: Performed at [facility], [address] [place], [place] [address]  Glucose, capillary     Status: Abnormal   Collection Time: 03/22/23  3:55 AM  Result Value Ref Range   Glucose-Capillary 158 (H) 70 - 99 mg/dL    Comment: Glucose reference range applies only to samples taken after fasting for at least 8 hours.  Glucose, capillary     Status: None   Collection Time: 03/22/23  6:23 AM  Result Value Ref Range   Glucose-Capillary 83 70 - 99 mg/dL    Comment: Glucose reference range applies only to  samples taken after fasting for at least 8 hours.   Comment 1 Notify RN    Comment 2 Document in Chart   Glucose, capillary     Status: Abnormal   Collection Time: 03/22/23  8:17 AM  Result Value Ref Range   Glucose-Capillary 249 (H) 70 - 99 mg/dL    Comment: Glucose reference range applies only to samples taken after fasting for at least 8 hours.   Comment 1 Notify RN   Glucose, capillary     Status: Abnormal   Collection Time: 03/22/23 11:48 AM  Result Value Ref Range   Glucose-Capillary 146 (H) 70 - 99 mg/dL    Comment: Glucose reference range applies only to samples taken after fasting for at least 8 hours.   Comment 1 Notify RN   Glucose, capillary     Status: Abnormal   Collection Time: 03/22/23  2:17 PM  Result Value Ref Range   Glucose-Capillary 126 (H) 70 - 99 mg/dL    Comment: Glucose reference range applies only to samples taken after fasting for at least 8 hours.   Comment 1 Notify RN     Current Facility-Administered Medications  Medication Dose Route Frequency Provider Last Rate Last Admin   acetaminophen (TYLENOL) tablet 650 mg  650 mg Oral Q4H PRN Oretha Milch, MD       ARIPiprazole (ABILIFY) tablet 10 mg  10 mg Oral Daily Akintayo, Mojeed, MD   10 mg at 03/22/23 6045   aspirin chewable tablet 81 mg  81 mg Oral Daily Cyril Mourning V, MD   81 mg at 03/22/23 0904   dextrose 50 % solution 0-50 mL  0-50 mL Intravenous PRN Oretha Milch, MD   50 mL at 03/20/23 1624   docusate sodium (COLACE) capsule 100 mg  100 mg Oral BID PRN Oretha Milch, MD       folic acid (FOLVITE) tablet 1 mg  1 mg Oral Daily Oretha Milch, MD   1 mg at 03/22/23 0905   HYDROcodone-acetaminophen (NORCO/VICODIN) 5-325 MG per tablet 1 tablet  1 tablet Oral Q6H PRN Oretha Milch, MD   1 tablet at 03/21/23 0933   insulin aspart (novoLOG) injection 0-5 Units  0-5 Units Subcutaneous QHS Rai, Ripudeep K, MD       insulin aspart (novoLOG) injection 0-9 Units  0-9 Units Subcutaneous TID WC Rai, Ripudeep  K, MD   1 Units at 03/22/23 1205   insulin detemir (LEVEMIR) injection 5 Units  5 Units Subcutaneous QHS Rai, Ripudeep K, MD       lactated ringers infusion   Intravenous Continuous Oretha Milch, MD 100 mL/hr at 03/22/23 1400 Infusion Verify at 03/22/23 1400   metoCLOPramide (REGLAN) tablet 5 mg  5 mg Oral Q8H  PRN Oretha Milch, MD       multivitamin with minerals tablet 1 tablet  1 tablet Oral Daily Oretha Milch, MD   1 tablet at 03/22/23 0904   ondansetron (ZOFRAN) injection 4 mg  4 mg Intravenous Q6H PRN Oretha Milch, MD   4 mg at 03/20/23 1234   Oral care mouth rinse  15 mL Mouth Rinse PRN Oretha Milch, MD       polyethylene glycol (MIRALAX / GLYCOLAX) packet 17 g  17 g Oral Daily PRN Oretha Milch, MD       ramelteon (ROZEREM) tablet 8 mg  8 mg Oral QHS Carilyn Goodpasture, MD   8 mg at 03/22/23 0033   Rivaroxaban (XARELTO) tablet 15 mg  15 mg Oral BID WC Calton Dach I, RPH   15 mg at 03/22/23 1204   Followed by   Melene Muller ON 04/12/2023] rivaroxaban (XARELTO) tablet 20 mg  20 mg Oral Q supper Calton Dach I, RPH       thiamine (VITAMIN B1) tablet 100 mg  100 mg Oral Daily Oretha Milch, MD   100 mg at 03/22/23 1610    Musculoskeletal: Strength & Muscle Tone: unable to access Gait & Station: unsteady Patient leans: N/A    Psychiatric Specialty Exam:  Presentation  General Appearance:  Appropriate for Environment; Disheveled  Eye Contact: Good  Speech: Pressured  Speech Volume: Normal  Handedness: Right   Mood and Affect  Mood: Euthymic  Affect: Congruent   Thought Process  Thought Processes: Coherent  Descriptions of Associations:Intact  Orientation:Full (Time, Place and Person)  Thought Content:Logical  History of Schizophrenia/Schizoaffective disorder:No data recorded Duration of Psychotic Symptoms:No data recorded Hallucinations:Hallucinations: None  Ideas of Reference:None  Suicidal Thoughts:Suicidal Thoughts: No  Homicidal  Thoughts:Homicidal Thoughts: No   Sensorium  Memory: Immediate Fair; Recent Fair; Remote Fair  Judgment: Fair  Insight: Fair   Chartered certified accountant: Fair  Attention Span: Fair  Recall: [employer] of Knowledge: Fair  Language: Fair   Psychomotor Activity  Psychomotor Activity: Psychomotor Activity: Normal   Assets  Assets: Communication Skills; Social Support   Sleep  Sleep: Sleep: Fair   Physical Exam: Physical Exam Vitals and nursing note reviewed. Exam conducted with a chaperone present.  Constitutional:      Appearance: He is underweight.  Neurological:     General: No focal deficit present.     Mental Status: He is alert and oriented to person, place, and time. Mental status is at baseline.  Psychiatric:        Mood and Affect: Mood normal.        Behavior: Behavior normal. Behavior is cooperative.        Thought Content: Thought content normal.        Judgment: Judgment normal.    Review of Systems  Psychiatric/Behavioral:  Positive for depression and substance abuse. Negative for hallucinations, memory loss and suicidal ideas. The patient is not nervous/anxious and does not have insomnia.   All other systems reviewed and are negative.  Blood pressure 112/88, pulse 87, temperature 98.2 F (36.8 C), temperature source Oral, resp. rate 20, weight 52.4 kg, SpO2 100 %. Body mass index is 17.06 kg/m.  Treatment Plan Summary: 52 y/o male with history of Schizophrenia, paranoid type and depressed who was admitted for DKA due to non-compliant with his diabetic medications. He reportedly expressed parasuicide statements out of frustration and quickly recanted those statements. [facility] of precaution medicine team requested  for psychiatry to see and assess. On today's evaluation he is appropriate and conversant, ready to discharge home. He is active and participates in a linear conversation. He is able to demonstrate capacity, although he was  able to decided that he will discharge when the doctor seems fit to do so. He continues to deny suicidal ideation.    -DC safety sitter -Continue Abilify 10 mg daily for Schizophrenia and depression -Patient has capacity at this time to leave AMA.  -Recommend outpatient follow up at this, and increase level of care.    Psych consult to sign off at this time.  Disposition: No evidence of imminent risk to self or others at present.   Patient does not meet criteria for psychiatric inpatient admission. Supportive therapy provided about ongoing stressors. Discussed crisis plan, support from social network, calling 911, coming to the Emergency Department, and calling Suicide Hotline.   Maryagnes Amos, FNP 03/22/2023 2:29 PM

## 2023-03-22 NOTE — Progress Notes (Signed)
ANTICOAGULATION CONSULT NOTE - Initial Consult  Pharmacy Consult for heparin infusion > xarelto Indication: aortic thrombus   Allergies  Allergen Reactions   Nitrous Oxide Nausea And Vomiting    Patient Measurements: Weight: 52.4 kg (115 lb 8.3 oz) Heparin Dosing Weight: n/a  Vital Signs: Temp: 98.2 F (36.8 C) (04/22 0749) Temp Source: Oral (04/22 0749) BP: 134/95 (04/22 1000) Pulse Rate: 89 (04/22 1000)  Labs: Recent Labs    03/19/23 1844 03/19/23 1844 03/19/23 1940 03/20/23 0046 03/20/23 0242 03/20/23 0945 03/21/23 0032 03/21/23 1156 03/22/23 0205  HGB  --    < >  --  11.4*  --   --  10.7*  --  10.1*  HCT  --   --   --  30.9*  --   --  30.3*  --  29.5*  PLT  --   --   --  295  --   --  204  --  174  HEPARINUNFRC  --   --   --   --    < > 0.43 0.32  --  0.14*  CREATININE 1.42*  --   --  1.12  --  0.99 0.74 0.79 0.81  TROPONINIHS 38*  --  38*  --   --   --   --   --   --    < > = values in this interval not displayed.    Estimated Creatinine Clearance: 81.8 mL/min (by C-G formula based on SCr of 0.81 mg/dL).   Medical History: Past Medical History:  Diagnosis Date   Asthma    Depression    Diabetes mellitus without complication (HCC)    Schizophrenia (HCC)    Assessment: 52 yo male presenting with sepsis and AKI. Noted to have epigastric pain. 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 No AC PTA. Pharmacy consulted to transition patient from heparin infusion to xarelto for therapy.  Co-pay: $4.00/month  CBC: Hgb 10.1, Plt 174   Goal of Therapy:  Monitor platelets by anticoagulation protocol: Yes   Plan:  Stop heparin drip Give first dose xarelto at time of heparin discontinuation Give xarelto  BID x21 days then  QHS F/u s/sx bleeding , CBC PRN   Calton Dach, PharmD Clinical Pharmacist 03/22/2023 10:36 AM

## 2023-03-23 DIAGNOSIS — I8290 Acute embolism and thrombosis of unspecified vein: Secondary | ICD-10-CM | POA: Diagnosis not present

## 2023-03-23 DIAGNOSIS — E1010 Type 1 diabetes mellitus with ketoacidosis without coma: Secondary | ICD-10-CM | POA: Diagnosis not present

## 2023-03-23 DIAGNOSIS — R0989 Other specified symptoms and signs involving the circulatory and respiratory systems: Secondary | ICD-10-CM

## 2023-03-23 DIAGNOSIS — G9340 Encephalopathy, unspecified: Secondary | ICD-10-CM | POA: Diagnosis not present

## 2023-03-23 DIAGNOSIS — N179 Acute kidney failure, unspecified: Secondary | ICD-10-CM | POA: Diagnosis not present

## 2023-03-23 LAB — CBC
HCT: 31 % — ABNORMAL LOW (ref 39.0–52.0)
Hemoglobin: 10.3 g/dL — ABNORMAL LOW (ref 13.0–17.0)
MCH: 33 pg (ref 26.0–34.0)
MCHC: 33.2 g/dL (ref 30.0–36.0)
MCV: 99.4 fL (ref 80.0–100.0)
Platelets: 185 10*3/uL (ref 150–400)
RBC: 3.12 MIL/uL — ABNORMAL LOW (ref 4.22–5.81)
RDW: 13.1 % (ref 11.5–15.5)
WBC: 6.2 10*3/uL (ref 4.0–10.5)
nRBC: 0 % (ref 0.0–0.2)

## 2023-03-23 LAB — GLUCOSE, CAPILLARY
Glucose-Capillary: 54 mg/dL — ABNORMAL LOW (ref 70–99)
Glucose-Capillary: 68 mg/dL — ABNORMAL LOW (ref 70–99)
Glucose-Capillary: 119 mg/dL — ABNORMAL HIGH (ref 70–99)
Glucose-Capillary: 104 mg/dL — ABNORMAL HIGH (ref 70–99)
Glucose-Capillary: 321 mg/dL — ABNORMAL HIGH (ref 70–99)

## 2023-03-23 LAB — HEPARIN LEVEL (UNFRACTIONATED): Heparin Unfractionated: >1.1 IU/mL — ABNORMAL HIGH (ref 0.30–0.70)

## 2023-03-23 MED ORDER — FOLIC ACID 1 MG PO TABS: 1.0000 mg | ORAL_TABLET | Freq: Every day | ORAL | 0 refills | Status: DC

## 2023-03-23 MED ORDER — INSULIN DETEMIR 100 UNIT/ML FLEXPEN: 5.0000 [IU] | PEN_INJECTOR | Freq: Every day | SUBCUTANEOUS | Status: DC

## 2023-03-23 MED ORDER — INSULIN DETEMIR 100 UNIT/ML ~~LOC~~ SOLN
4.0000 [IU] | Freq: Every day | SUBCUTANEOUS | Status: DC
  Filled 2023-03-23: qty 0.04

## 2023-03-23 MED ORDER — INSULIN DETEMIR 100 UNIT/ML FLEXPEN: 8.0000 [IU] | PEN_INJECTOR | Freq: Every day | SUBCUTANEOUS | Status: DC

## 2023-03-23 MED ORDER — RAMELTEON 8 MG PO TABS: 8.0000 mg | ORAL_TABLET | Freq: Every day | ORAL | 0 refills | Status: DC

## 2023-03-23 MED ORDER — METOCLOPRAMIDE HCL 5 MG PO TABS: 5.0000 mg | ORAL_TABLET | Freq: Three times a day (TID) | ORAL | 0 refills | Status: DC | PRN

## 2023-03-23 MED ORDER — ARIPIPRAZOLE 10 MG PO TABS: 10.0000 mg | ORAL_TABLET | Freq: Every day | ORAL | 0 refills | Status: DC

## 2023-03-23 MED ORDER — RIVAROXABAN 20 MG PO TABS: 20.0000 mg | ORAL_TABLET | Freq: Every day | ORAL | 3 refills | Status: DC

## 2023-03-23 MED ORDER — RIVAROXABAN (XARELTO) VTE STARTER PACK (15 & 20 MG): ORAL_TABLET | ORAL | 0 refills | Status: DC

## 2023-03-23 MED ORDER — ASPIRIN 81 MG PO CHEW: 81.0000 mg | CHEWABLE_TABLET | Freq: Every day | ORAL | 3 refills | Status: DC

## 2023-03-23 MED ORDER — ADULT MULTIVITAMIN W/MINERALS CH: 1.0000 | ORAL_TABLET | Freq: Every day | ORAL | 0 refills | Status: DC

## 2023-03-23 MED ORDER — PANTOPRAZOLE SODIUM 40 MG PO TBEC: 40.0000 mg | DELAYED_RELEASE_TABLET | Freq: Every day | ORAL | 0 refills | Status: DC

## 2023-03-23 NOTE — Discharge Summary (Signed)
Physician Discharge Summary   Patient: Tom Anderson MRN: [id number] DOB: 1971-06-29  Admit date:     03/19/2023  Discharge date: 03/23/23  Discharge Physician: Thad Ranger, MD    PCP: Montez Hageman, DO   Recommendations at discharge:   Started on Xarelto therapeutic dose, starter pack 15 mg twice daily for 3 weeks then 20 mg daily.  Prescriptions given for the starter pack and 20 mg daily dose. 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  Patient was found to be in DKA and confused.  His mother reported that he stops taking his insulin without any clear reason.  He was not eating and was lethargic on admission. In ED, glucose> 1200, bicarb less than 7, anion gap not calculated, creatinine 2.4,AST 96, ALT 250, bili 1.7, BHB >8, pH 6.89. Given his encephalopathy and severity of DKA, patient was admitted to ICU by CCM.  Significant Hospital Events:    4/19 admit with DKA 4/19 CT abdomen/pelvis -small nonocclusive emboli in the lower thoracic and distal abdominal aorta>> vascular consult, started IV heparin , 4/20 transition off insulin at 6:30 AM  , 4/20 echo with hypokinetic LV, McConnell sign RV cardiology consulted.   Psychiatry also consulted. 4/22: Transferred to [facility], assumed care   Assessment and Plan:  DKA (diabetic ketoacidosis) in the setting of type 1 diabetes mellitus, noncompliant, lactic acidosis -Hemoglobin A1c 11.8 on 01/12/2023, will repeat -Recurrent admissions for DKA due to severe noncompliance. 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 -CT abdomen pelvis showed small nonocclusive emboli in the lower thoracic and distal abdominal aorta -Vascular surgery was consulted and patient was placed on IV heparin drip -Repeat CTA chest abdomen pelvis showed new consolidation in the right lower lobe, suggestive of infectious pneumonia, mild volume ascites, similar filling defect of the descending thoracic aorta and infrarenal abdominal aorta 8 mm -Per vascular surgery, patient will require 3 months of anticoagulation and will follow outpatient for repeat CT abdomen -Initially started on IV heparin drip, transitioned to Xarelto -Discussed with Dr. Chestine Spore prior to discharge, recommended to continue aspirin with Xarelto   Abnormal RV apex, McConnell's sign and LV apical hypokinesis  chronic HFrEF (heart failure with reduced ejection fraction), EF 45 to 50% -CTA chest showed no PE, +nonocclusive thrombus along the left wall of the descending thoracic aorta near the diaphragm -2D echo showed for 45 to 50%, G1 DD, mild concentric LVH, RV apical hypokinesis, mildly reduced RV SF,RV McConnell's sign -Cardiology was consulted and recommended DOAC -No further cardiovascular testing inpatient.  Will need outpatient follow-up with cardiology for repeat echo   Right lower lobe pneumonia -CTA chest showed consolidation in the right lower lobe?  Aspiration -  Currently no fevers, leukocytosis or respiratory symptoms -Hold off on  antibiotics for now, if develops any fever, will place on oral Augmentin    Schizophrenia, major depressive disorder, SI  -Patient expressed suicidal thoughts during hospitalization  -Seen by psychiatry, started on Abilify 10 mg daily   Acute kidney injury with dehydration, lactic acidosis -Lactic acid> 9.0 on admission -Creatinine 2.44 on admission, creatinine was 0.6 on 02/12/2023 likely due to #1 -Placed on aggressive IV fluid hydration, IV insulin drip. -Creatinine improved, 0.8   Hyponatremia -Sodium 119 on admission likely due to dehydration and pseudohyponatremia due to hyperglycemia -Sodium improving, 128 on discharge      Transaminitis -Has history of etoh abuse, stopped 4 months ago - LFTs elevated on admission, likely due to hypotension and #1 -Abdominal ultrasound showed no cholelithiasis or acute cholecystitis.  Increased hepatic echogenicity seen with hepatic steatosis -Improving   Acute metabolic encephalopathy -Likely due to #1, resolved, - currently alert and oriented, close to his baseline     Moderate cannabis use disorder - Counseled on cessation   Underweight, moderate protein calorie malnutrition, hypoalbuminemia, albumin 2.8 Estimated body mass index is 17.06 kg/m as calculated from the following:   Height as of 02/09/23: 5\' 9"  (1.753 m).   Weight as of this encounter: 52.4 kg.       Pain control - [employer] Controlled Substance Reporting System database was reviewed. and patient was instructed, not to drive, operate heavy machinery, perform activities at heights, swimming or participation in water activities or provide baby-sitting services while on Pain, Sleep and Anxiety Medications; until their outpatient Physician has advised to do so again. Also recommended to not to take more than prescribed Pain, Sleep and Anxiety Medications.  Consultants: Vascular surgery, Dr. Chestine Spore, cardiology, psychiatry.  Admitted by CCM Procedures performed: 2D  echo Disposition: Home Diet recommendation:  Discharge Diet Orders (From admission, onward)     Start     Ordered   03/23/23 0000  Diet Carb Modified        03/23/23 0911            DISCHARGE MEDICATION: Allergies as of 03/23/2023       Reactions   Nitrous Oxide Nausea And Vomiting        Medication List     TAKE these medications    ARIPiprazole 10 MG tablet Commonly known as: ABILIFY Take 1 tablet (10 mg total) by mouth daily.   aspirin 81 MG chewable tablet Chew 1 tablet (81 mg total) by mouth daily.   feeding supplement (GLUCERNA SHAKE) Liqd Take 237 mLs by mouth 2 (two) times daily between meals.   folic acid 1 MG tablet Commonly known as: FOLVITE Take 1 tablet (1 mg total) by mouth daily.   insulin aspart 100 UNIT/ML FlexPen Commonly known as: NOVOLOG For glucose 121 to 150 use 1 unit, for 151 to 200 use 2 units, for 201 to 250 use 3 units, for 251 to 300 use 5 units, for 301 to 350 use 7 units for 351 or greater use 9 units. What changed:  how much to take how to take this when to take this additional instructions   insulin detemir 100 UNIT/ML FlexPen Commonly known as: LEVEMIR Inject 8 Units into the skin at bedtime.   metoCLOPramide 5 MG tablet Commonly known as: Reglan Take 1 tablet (5 mg total) by mouth every 8 (eight) hours as needed for nausea or vomiting. What changed: reasons to take this   multivitamin with  minerals Tabs tablet Take 1 tablet by mouth daily.   pantoprazole 40 MG tablet Commonly known as: PROTONIX Take 1 tablet (40 mg total) by mouth daily. What changed: when to take this   ramelteon 8 MG tablet Commonly known as: ROZEREM Take 1 tablet (8 mg total) by mouth at bedtime.   Rivaroxaban Stater Pack (15 mg and 20 mg) Commonly known as: XARELTO STARTER PACK Follow package directions: Take one 15mg  tablet by mouth twice a day. On day 22 (04/12/2023), switch to one 20mg  tablet once a day. Take with food.   rivaroxaban  20 MG Tabs tablet Commonly known as: XARELTO Take 1 tablet (20 mg total) by mouth daily with supper. Take this when the xarelto starter pack is completed. Start taking on: Apr 12, 2023               Discharge Care Instructions  (From admission, onward)           Start     Ordered   03/23/23 0000  If the dressing is still on your incision site when you go home, remove it on the third day after your surgery date. Remove dressing if it begins to fall off, or if it is dirty or damaged before the third day.        03/23/23 0911            Follow-up Information     Cephus Shelling, MD Follow up in 1 month(s).   Specialty: Vascular Surgery Contact information: [address] [place] [address] [phone]         Montez Hageman, DO. Schedule an appointment as soon as possible for a visit in 2 week(s).   Specialty: Family Medicine Why: for hospital follow-up Contact information: [address] [place] [address] [phone]         Thomasene Ripple, DO. Schedule an appointment as soon as possible for a visit in 2 week(s).   Specialty: Cardiology Why: for hospital follow-up Contact information: [address] [place] [address] [phone]                Discharge Exam: [name] Weights   03/21/23 0500 03/21/23 0800 03/22/23 0404  Weight: 33.7 kg 54.3 kg 52.4 kg   S: No acute complaints, hoping to go home today.  Cleared by cardiology and vascular surgery.  Per psychiatry, deemed competent to make his decisions.  BP 116/80   Pulse 90   Temp (!) 97.5 F (36.4 C) (Oral)   Resp (!) 22   Wt 52.4 kg   SpO2 100%   BMI 17.06 kg/m    Physical Exam General: Alert and oriented x 3, NAD Cardiovascular: S1 S2 clear, RRR.  Respiratory: CTAB, no wheezing, rales or rhonchi Gastrointestinal: Soft, nontender, nondistended, NBS Ext: no pedal edema bilaterally Neuro: no new deficits Psych: Normal affect    Condition at  discharge: fair  The results of significant diagnostics from this hospitalization (including imaging, microbiology, ancillary and laboratory) are listed below for reference.   Imaging Studies: CT Angio Abd/Pel w/ and/or w/o  Result Date: 03/21/2023 CLINICAL DATA:  Abdominal aortic aneurysm (AAA), follow up aortic thrombus EXAM: CTA ABDOMEN AND PELVIS WITHOUT AND WITH CONTRAST TECHNIQUE: Multidetector CT imaging of the abdomen and pelvis was performed using the standard protocol during bolus administration of intravenous contrast. Multiplanar reconstructed images and MIPs were obtained and reviewed to evaluate the vascular anatomy. RADIATION DOSE REDUCTION: This exam was performed according to  the departmental dose-optimization program which includes automated exposure control, adjustment of the mA and/or kV according to patient size and/or use of iterative reconstruction technique. CONTRAST:  OMNIPAQUE IOHEXOL 350 MG/ML SOLN COMPARISON:  CTA chest dated same day, CTA abdomen pelvis March 19, 2023 FINDINGS: VASCULAR Aorta: The abdominal aorta is normal in caliber scattered atherosclerotic disease, again seen are 2 foci of wall adherent thrombus, in the descending thoracic aorta measuring 8 x 7 mm, and in the infrarenal abdominal aorta measuring 8 x 5 mm. Overall dimensions are similar to previous exam. No new filling defects identified. Celiac: Patent without evidence of thromboembolic disease. SMA: Patent without evidence of thromboembolic disease. IMA: Patent. Renals: Both renal arteries are patent without evidence of thromboembolic disease. Inflow: Visualized iliofemoral vessels are patent without findings of thromboembolic disease. Veins: No obvious venous abnormality within the limitations of this arterial phase study. NON-VASCULAR Inferior chest: New consolidation in the right lower lobe, compatible with pneumonia and more completely evaluated on same day CTA chest. Hepatobiliary: The liver is  normal in size without focal abnormality. No intrahepatic or extrahepatic biliary ductal dilation. The gallbladder appears normal. Spleen: Appears small, otherwise without focal abnormality. Pancreas: No pancreatic ductal dilatation or surrounding inflammatory changes. Adrenals/Urinary Tract: Adrenal glands are unremarkable. Kidneys are normal, without renal calculi, focal lesion, or hydronephrosis. Bladder is unremarkable. Stomach/Bowel: The stomach, small bowel and large bowel are normal in caliber without abnormal wall thickening or surrounding inflammatory changes. Reproductive: Prostate is unremarkable. Lymphatic: No enlarged lymph nodes in the abdomen or pelvis. Other: Mild volume ascites in the dependent portion of the pelvis. Musculoskeletal: No aggressive osseous lesions. The soft tissues are unremarkable. IMPRESSION: VASCULAR 1. Again seen are similar filling defects of the descending thoracic aorta and infrarenal abdominal aorta measuring up to 8 mm, not significantly changed in size or appearance. Findings could represent either in Situ thrombus or thromboembolic disease. No new filling defects of the aorta or its branch vessels are identified on this exam. NON-VASCULAR 1. New consolidation of the right lower lobe, more completely evaluated on same day CTA chest, suggestive of infectious pneumonia. 2. Mild volume ascites in the dependent portion of the pelvis. Electronically Signed   By: Olive Bass M.D.   On: 03/21/2023 12:08   CT Angio Chest Pulmonary Embolism (PE) W or WO Contrast  Result Date: 03/21/2023 CLINICAL DATA:  Pulmonary embolism suspected, high probability. Abdominal aortic thrombus. EXAM: CT ANGIOGRAPHY CHEST WITH CONTRAST TECHNIQUE: Multidetector CT imaging of the chest was performed using the standard protocol during bolus administration of intravenous contrast. Multiplanar CT image reconstructions and MIPs were obtained to evaluate the vascular anatomy. RADIATION DOSE REDUCTION:  This exam was performed according to the departmental dose-optimization program which includes automated exposure control, adjustment of the mA and/or kV according to patient size and/or use of iterative reconstruction technique. CONTRAST:  OMNIPAQUE IOHEXOL 350 MG/ML SOLN COMPARISON:  One-view chest x-ray 03/19/2023. FINDINGS: Cardiovascular: The heart size is normal. No significant pericardial effusion is present. Aortic arch and great vessel origins are within normal limits. Nonocclusive thrombus is noted along the left wall of the descending thoracic aorta near the diaphragm. Pulmonary artery opacification is excellent. No focal filling defects are present to suggest pulmonary embolus. Mediastinum/Nodes: No enlarged mediastinal, hilar, or axillary lymph nodes. Thyroid gland, trachea, and esophagus demonstrate no significant findings. Lungs/Pleura: Centrilobular and paraseptal emphysematous changes are noted bilaterally. A large pleural globe is present anterior to the mediastinum adjacent to the left upper lobe. Focal  airspace opacity is present in the medial right lower lobe. Mild dependent atelectasis is noted bilaterally. No other focal airspace disease is present. No nodule or mass lesion is present. No significant pleural effusion or pneumothorax is present. Upper Abdomen: Limited imaging the abdomen is unremarkable. There is no significant adenopathy. No solid organ lesions are present. Musculoskeletal: Vertebral body heights and alignment are normal. No focal osseous lesions are present. The ribs are unremarkable. Review of the MIP images confirms the above findings. IMPRESSION: 1. No pulmonary embolus. 2. Nonocclusive thrombus along the left wall of the descending thoracic aorta near the diaphragm. 3. Focal airspace opacity in the medial right lower lobe concerning for pneumonia. 4.  Emphysema (ICD10-J43.9). Electronically Signed   By: Marin Roberts M.D.   On: 03/21/2023 11:44    ECHOCARDIOGRAM COMPLETE  Result Date: 03/20/2023    ECHOCARDIOGRAM REPORT   Patient Name:   ALOYSUIS RIBAUDO Date of Exam: 03/20/2023 Medical Rec #:  [id number]     Height:       69.0 in Accession #:    [id number]    Weight:       117.1 lb Date of Birth:  July 09, 1971     BSA:          1.645 m Patient Age:    51 years      BP:           105/76 mmHg Patient Gender: M             HR:           85 bpm. Exam Location:  Inpatient Procedure: 2D Echo, Cardiac Doppler and Color Doppler                                 MODIFIED REPORT:     This report was modified by Riley Lam MD on 03/20/2023 due to                           Discussed with primary team.  Indications:     Thrombus  History:         Patient has no prior history of Echocardiogram examinations.                  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  Sonographer:     Dondra Prader RVT RCS Referring Phys:  [id number] Beulah Gandy PAYNE Diagnosing Phys: Riley Lam MD  Sonographer Comments: Image acquisition challenging due to patient body habitus and Image acquisition challenging due to uncooperative patient. Patient would not lay still; patient had restraints on IMPRESSIONS  1. Left ventricular ejection fraction, by estimation, is 45 to 50%. The left ventricle has mildly decreased function. The left ventricle has no regional wall motion abnormalities. There is mild concentric left ventricular hypertrophy. Left ventricular diastolic parameters are consistent with Grade I diastolic dysfunction (impaired relaxation).  2. No LV apical thrombus.  3. RV apical hypokinesis. Right ventricular systolic function is mildly reduced. The right ventricular size is normal.  4. RV Mconnell's Sign  5. A small pericardial effusion is present. The pericardial effusion is anterior to the right ventricle.  6. The mitral valve is normal in structure. Mild mitral valve regurgitation. No evidence of mitral stenosis.  7. The aortic valve is tricuspid. There is mild  thickening of the aortic valve. Aortic valve regurgitation is not visualized.  8. Echodensity seen  in descending aorta-in clinical correlation to CT consistent with thrombus.  9. The inferior vena cava is normal in size with greater than 50% respiratory variability, suggesting right atrial pressure of 3 mmHg. Comparison(s): No prior Echocardiogram. Conclusion(s)/Recommendation(s): Consider CTPE if clinically indicated. Discussed with primary team; caridology to consult. FINDINGS  Left Ventricle: Left ventricular ejection fraction, by estimation, is 45 to 50%. The left ventricle has mildly decreased function. The left ventricle has no regional wall motion abnormalities. The left ventricular internal cavity size was normal in size. There is mild concentric left ventricular hypertrophy. Left ventricular diastolic parameters are consistent with Grade I diastolic dysfunction (impaired relaxation).  LV Wall Scoring: The apical inferior segment is hypokinetic. Right Ventricle: RV apical hypokinesis. The right ventricular size is normal. No increase in right ventricular wall thickness. Right ventricular systolic function is mildly reduced. Left Atrium: Left atrial size was normal in size. Right Atrium: Right atrial size was normal in size. Pericardium: A small pericardial effusion is present. The pericardial effusion is anterior to the right ventricle. Mitral Valve: The mitral valve is normal in structure. Mild mitral valve regurgitation. No evidence of mitral valve stenosis. Tricuspid Valve: The tricuspid valve is normal in structure. Tricuspid valve regurgitation is not demonstrated. No evidence of tricuspid stenosis. Aortic Valve: The aortic valve is tricuspid. There is mild thickening of the aortic valve. Aortic valve regurgitation is not visualized. Aortic valve mean gradient measures 1.0 mmHg. Aortic valve peak gradient measures 2.0 mmHg. Aortic valve area, by VTI  measures 3.35 cm. Pulmonic Valve: The pulmonic valve  was normal in structure. Pulmonic valve regurgitation is not visualized. No evidence of pulmonic stenosis. Aorta: Echodensity seen in descending aorta-in clinical correlation to CT consistent with thrombus. The aortic root and ascending aorta are structurally normal, with no evidence of dilitation. Venous: The inferior vena cava is normal in size with greater than 50% respiratory variability, suggesting right atrial pressure of 3 mmHg. IAS/Shunts: No atrial level shunt detected by color flow Doppler.  LEFT VENTRICLE PLAX 2D LVIDd:         4.20 cm   Diastology LVIDs:         2.40 cm   LV e' medial:    7.94 cm/s LV PW:         1.00 cm   LV E/e' medial:  5.5 LV IVS:        1.10 cm   LV e' lateral:   11.30 cm/s LVOT diam:     2.20 cm   LV E/e' lateral: 3.9 LV SV:         37 LV SV Index:   22 LVOT Area:     3.80 cm  RIGHT VENTRICLE             IVC RV Basal diam:  2.60 cm     IVC diam: 1.70 cm RV Mid diam:    2.20 cm RV S prime:     12.10 cm/s TAPSE (M-mode): 1.8 cm LEFT ATRIUM           Index        RIGHT ATRIUM          Index LA diam:      2.80 cm 1.70 cm/m   RA Area:     8.34 cm LA Vol (A2C): 23.2 ml 14.10 ml/m  RA Volume:   17.53 ml 10.65 ml/m LA Vol (A4C): 18.0 ml 10.97 ml/m  AORTIC VALVE  PULMONIC VALVE AV Area (Vmax):    3.35 cm     PV Vmax:       0.76 m/s AV Area (Vmean):   3.13 cm     PV Peak grad:  2.3 mmHg AV Area (VTI):     3.35 cm AV Vmax:           70.90 cm/s AV Vmean:          48.100 cm/s AV VTI:            0.110 m AV Peak Grad:      2.0 mmHg AV Mean Grad:      1.0 mmHg LVOT Vmax:         62.50 cm/s LVOT Vmean:        39.600 cm/s LVOT VTI:          0.097 m LVOT/AV VTI ratio: 0.88  AORTA Ao Root diam: 3.30 cm Ao Asc diam:  3.00 cm MITRAL VALVE MV Area (PHT): 3.40 cm    SHUNTS MV Decel Time: 223 msec    Systemic VTI:  0.10 m MV E velocity: 43.70 cm/s  Systemic Diam: 2.20 cm MV A velocity: 46.00 cm/s MV E/A ratio:  0.95 Riley Lam MD Electronically signed by Riley Lam MD Signature Date/Time: 03/20/2023/10:05:35 AM    Final (Updated)    CT ABDOMEN PELVIS W CONTRAST  Result Date: 03/19/2023 CLINICAL DATA:  Acute abdominal pain.  Diabetic ketoacidosis. EXAM: CT ABDOMEN AND PELVIS WITH CONTRAST TECHNIQUE: Multidetector CT imaging of the abdomen and pelvis was performed using the standard protocol following bolus administration of intravenous contrast. RADIATION DOSE REDUCTION: This exam was performed according to the departmental dose-optimization program which includes automated exposure control, adjustment of the mA and/or kV according to patient size and/or use of iterative reconstruction technique. CONTRAST:  75mL OMNIPAQUE IOHEXOL 350 MG/ML SOLN COMPARISON:  03/11/2023 FINDINGS: Lower Chest: No acute findings. Hepatobiliary: No hepatic masses identified. Gallbladder is unremarkable. No evidence of biliary ductal dilatation. Pancreas:  No mass or inflammatory changes. Spleen: Within normal limits in size and appearance. Adrenals/Urinary Tract: No suspicious masses identified. No evidence of ureteral calculi or hydronephrosis. Unremarkable unopacified urinary bladder. Stomach/Bowel: No evidence of obstruction, inflammatory process or abnormal fluid collections. Vascular/Lymphatic: No pathologically enlarged lymph nodes. Aortic atherosclerotic calcification again noted. In addition, there are new small intraluminal filling defects in the lower thoracic aorta and distal abdominal aorta at the bifurcation, which were not seen on recent study and are consistent with small nonocclusive emboli. The iliac and femoral arteries are patent. Reproductive:  No mass or other significant abnormality. Other:  Mild ascites is new since previous study. Musculoskeletal:  No suspicious bone lesions identified. IMPRESSION: New mild ascites. No focal inflammatory process or abscess identified. New small nonocclusive emboli in lower thoracic and distal abdominal aorta. Proximal source  of emboli is not visualized on this exam. Consider further evaluation with chest CTA or echocardiogram. Aortic Atherosclerosis (ICD10-I70.0). Critical Value/emergent results were called by telephone at the time of interpretation on 03/19/2023 at 6:58 pm to provider Emilie Rutter , who verbally acknowledged these results. Electronically Signed   By: Danae Orleans M.D.   On: 03/19/2023 19:04   US Abdomen Limited RUQ (LIVER/GB)  Result Date: 03/19/2023 CLINICAL DATA:  Right upper quadrant pain EXAM: ULTRASOUND ABDOMEN LIMITED RIGHT UPPER QUADRANT COMPARISON:  None Available. FINDINGS: Gallbladder: No gallstones or wall thickening visualized. No sonographic Murphy sign noted by sonographer. Common bile duct: Diameter: 3.1 mm Liver: No focal  lesion identified. Increased hepatic parenchymal echogenicity. Portal vein is patent on color Doppler imaging with normal direction of blood flow towards the liver. Other: None. IMPRESSION: 1. No cholelithiasis or sonographic evidence of acute cholecystitis. 2. Increased hepatic echogenicity as can be seen with hepatic steatosis. Electronically Signed   By: Elige Ko M.D.   On: 03/19/2023 12:16   DG Chest Portable 1 View  Result Date: 03/19/2023 CLINICAL DATA:  sob EXAM: PORTABLE CHEST 1 VIEW COMPARISON:  March 12, 24. FINDINGS: The heart size and mediastinal contours are within normal limits. Both lungs are clear. No visible pleural effusions or pneumothorax. No acute osseous abnormality. Remote left rib fracture. IMPRESSION: No active disease. Electronically Signed   By: Feliberto Harts M.D.   On: 03/19/2023 10:11    Microbiology: Results for orders placed or performed during the hospital encounter of 03/19/23  Blood culture (routine x 2)     Status: None (Preliminary result)   Collection Time: 03/19/23  2:49 PM   Specimen: BLOOD  Result Value Ref Range Status   Specimen Description BLOOD BLOOD LEFT FOREARM  Final   Special Requests   Final    BOTTLES DRAWN  AEROBIC AND ANAEROBIC Blood Culture results may not be optimal due to an excessive volume of blood received in culture bottles   Culture   Final    NO GROWTH 4 DAYS Performed at [facility], [address], [place], [place] [address]    Report Status PENDING  Incomplete  Blood culture (routine x 2)     Status: None (Preliminary result)   Collection Time: 03/19/23  2:49 PM   Specimen: BLOOD RIGHT HAND  Result Value Ref Range Status   Specimen Description BLOOD RIGHT HAND  Final   Special Requests   Final    BOTTLES DRAWN AEROBIC AND ANAEROBIC Blood Culture results may not be optimal due to an excessive volume of blood received in culture bottles   Culture   Final    NO GROWTH 4 DAYS Performed at [facility], [address] [place], [place] [address]    Report Status PENDING  Incomplete  MRSA Next Gen by PCR, Nasal     Status: None   Collection Time: 03/19/23  3:14 PM   Specimen: Nasal Mucosa; Nasal Swab  Result Value Ref Range Status   MRSA by PCR Next Gen NOT DETECTED NOT DETECTED Final    Comment: (NOTE) The GeneXpert MRSA Assay (FDA approved for NASAL specimens only), is one component of a comprehensive MRSA colonization surveillance program. It is not intended to diagnose MRSA infection nor to guide or monitor treatment for MRSA infections. Test performance is not FDA approved in patients less than 73 years old. Performed at [facility], [address], [place], [place] [address]     Labs: CBC: Recent Labs  Lab 03/19/23 0913 03/19/23 0937 03/19/23 1010 03/20/23 0046 03/21/23 0032 03/22/23 0205 03/23/23 0132  WBC 12.6*  --  9.0 9.9 9.3 8.0 6.2  NEUTROABS 9.0*  --   --   --  6.4  --   --   HGB 13.4   < > 11.1* 11.4* 10.7* 10.1* 10.3*  HCT 46.0   < > 36.0* 30.9* 30.3* 29.5* 31.0*  MCV 117.9*  --  87.2 91.7 95.9 98.0 99.4  PLT 448*  --  367 295 204 174 185   < > = values in this interval not displayed.   Basic Metabolic Panel: Recent Labs  Lab 03/19/23 1844 03/20/23 0046 03/20/23 0945 03/21/23 0032 03/21/23 1156 03/21/23 2031 03/22/23 0205  NA 128* 127* 130* 132* 127*  --  128*  K 3.2* 4.3 4.0 2.9* 3.8  --  3.7  CL 91* 91* 96* 95* 92*  --  92*  CO2 18* 24 23 29 25   --  26  GLUCOSE 248* 161* 138* 62* 232*  --  146*  BUN 21* 17 14 7 6   --  11  CREATININE 1.42* 1.12 0.99 0.74 0.79  --  0.81  CALCIUM 8.4* 8.4* 8.2* 8.1* 7.8*  --  8.2*  MG 1.9 1.9  --  1.9  --   --  2.2  PHOS  --  2.3*  --  1.3*  --  2.2* 3.7   Liver Function Tests: Recent Labs  Lab 03/19/23 0913 03/20/23 0046  AST 96* 61*  ALT 250* 153*  ALKPHOS 299* 177*  BILITOT 1.7* 0.6  PROT 7.2 4.8*  ALBUMIN 4.2 2.8*   CBG: Recent Labs  Lab 03/22/23 2045 03/23/23 0557 03/23/23 0615 03/23/23 0636 03/23/23 0732  GLUCAP 286* 54* 68* 119* 104*    Discharge time spent: greater than 30 minutes.  Signed: Thad Ranger, MD [facility] 03/23/2023

## 2023-03-23 NOTE — Progress Notes (Addendum)
Progress Note  Patient Name: Tom Anderson Date of Encounter: 03/23/2023  Primary Cardiologist: None   Subjective   Patient was seen and examined at his bedside.  He has a Comptroller with him.  He was awake and alert sitting on the bed.  Inpatient Medications    Scheduled Meds:  ARIPiprazole  10 mg Oral Daily   aspirin  81 mg Oral Daily   folic acid  1 mg Oral Daily   insulin aspart  0-5 Units Subcutaneous QHS   insulin aspart  0-9 Units Subcutaneous TID WC   insulin detemir  4 Units Subcutaneous QHS   multivitamin with minerals  1 tablet Oral Daily   ramelteon  8 mg Oral QHS   Rivaroxaban  15 mg Oral BID WC   Followed by   Melene Muller ON 04/12/2023] rivaroxaban  20 mg Oral Q supper   thiamine  100 mg Oral Daily   Continuous Infusions:  lactated ringers 100 mL/hr at 03/23/23 0800   PRN Meds: acetaminophen, dextrose, docusate sodium, HYDROcodone-acetaminophen, metoCLOPramide, ondansetron (ZOFRAN) IV, mouth rinse, polyethylene glycol   Vital Signs    Vitals:   03/23/23 0745 03/23/23 0800 03/23/23 0815 03/23/23 0830  BP:      Pulse: 88 88 91 92  Resp: (!) 22 13 (!) 22 16  Temp:      TempSrc:      SpO2: 100% 100% 100% 100%  Weight:        Intake/Output Summary (Last 24 hours) at 03/23/2023 0911 Last data filed at 03/23/2023 0800 Gross per 24 hour  Intake 3520.17 ml  Output 5400 ml  Net -1879.83 ml   Filed Weights   03/21/23 0500 03/21/23 0800 03/22/23 0404  Weight: 33.7 kg 54.3 kg 52.4 kg    Telemetry    Sinus rhythm - Personally Reviewed  ECG    none - Personally Reviewed  Physical Exam   General: Comfortable Head: Atraumatic, normal size  Eyes: PEERLA, EOMI  Neck: Supple, normal JVD Cardiac: Normal S1, S2; RRR; no murmurs, rubs, or gallops Lungs: Clear to auscultation bilaterally Abd: Soft, nontender, no hepatomegaly  Ext: warm, no edema Musculoskeletal: No deformities, BUE and BLE strength normal and equal Skin: Warm and dry, no rashes   Neuro:  Alert and oriented to person, place, time, and situation, CNII-XII grossly intact, no focal deficits  Psych: Normal mood and affect   Labs    Chemistry Recent Labs  Lab 03/19/23 0913 03/19/23 0937 03/20/23 0046 03/20/23 0945 03/21/23 0032 03/21/23 1156 03/22/23 0205  NA 125*   < > 127*   < > 132* 127* 128*  K 4.8   < > 4.3   < > 2.9* 3.8 3.7  CL 79*   < > 91*   < > 95* 92* 92*  CO2 <7*   < > 24   < > GLUCOSE >1,200*   < > 161*   < > 62* 232* 146*  BUN 31*   < > 17   < > CREATININE 2.44*   < > 1.12   < > 0.74 0.79 0.81  CALCIUM 9.1   < > 8.4*   < > 8.1* 7.8* 8.2*  PROT 7.2  --  4.8*  --   --   --   --   ALBUMIN 4.2  --  2.8*  --   --   --   --   AST 96*  --  61*  --   --   --   --  ALT 250*  --  153*  --   --   --   --   ALKPHOS 299*  --  177*  --   --   --   --   BILITOT 1.7*  --  0.6  --   --   --   --   GFRNONAA 32*   < > >60   < > >60 >60 >60  ANIONGAP NOT CALCULATED   < > 12   < > < > = values in this interval not displayed.     Hematology Recent Labs  Lab 03/21/23 0032 03/22/23 0205 03/23/23 0132  WBC 9.3 8.0 6.2  RBC 3.16* 3.01* 3.12*  HGB 10.7* 10.1* 10.3*  HCT 30.3* 29.5* 31.0*  MCV 95.9 98.0 99.4  MCH 33.9 33.6 33.0  MCHC 35.3 34.2 33.2  RDW 12.6 12.8 13.1  PLT 204 174 185    Cardiac EnzymesNo results for input(s): "TROPONINI" in the last 168 hours. No results for input(s): "TROPIPOC" in the last 168 hours.   BNPNo results for input(s): "BNP", "PROBNP" in the last 168 hours.   DDimer No results for input(s): "DDIMER" in the last 168 hours.   Radiology    CT Angio Abd/Pel w/ and/or w/o  Result Date: 03/21/2023 CLINICAL DATA:  Abdominal aortic aneurysm (AAA), follow up aortic thrombus EXAM: CTA ABDOMEN AND PELVIS WITHOUT AND WITH CONTRAST TECHNIQUE: Multidetector CT imaging of the abdomen and pelvis was performed using the standard protocol during bolus administration of intravenous contrast. Multiplanar reconstructed  images and MIPs were obtained and reviewed to evaluate the vascular anatomy. RADIATION DOSE REDUCTION: This exam was performed according to the departmental dose-optimization program which includes automated exposure control, adjustment of the mA and/or kV according to patient size and/or use of iterative reconstruction technique. CONTRAST:  OMNIPAQUE IOHEXOL 350 MG/ML SOLN COMPARISON:  CTA chest dated same day, CTA abdomen pelvis March 19, 2023 FINDINGS: VASCULAR Aorta: The abdominal aorta is normal in caliber scattered atherosclerotic disease, again seen are 2 foci of wall adherent thrombus, in the descending thoracic aorta measuring 8 x 7 mm, and in the infrarenal abdominal aorta measuring 8 x 5 mm. Overall dimensions are similar to previous exam. No new filling defects identified. Celiac: Patent without evidence of thromboembolic disease. SMA: Patent without evidence of thromboembolic disease. IMA: Patent. Renals: Both renal arteries are patent without evidence of thromboembolic disease. Inflow: Visualized iliofemoral vessels are patent without findings of thromboembolic disease. Veins: No obvious venous abnormality within the limitations of this arterial phase study. NON-VASCULAR Inferior chest: New consolidation in the right lower lobe, compatible with pneumonia and more completely evaluated on same day CTA chest. Hepatobiliary: The liver is normal in size without focal abnormality. No intrahepatic or extrahepatic biliary ductal dilation. The gallbladder appears normal. Spleen: Appears small, otherwise without focal abnormality. Pancreas: No pancreatic ductal dilatation or surrounding inflammatory changes. Adrenals/Urinary Tract: Adrenal glands are unremarkable. Kidneys are normal, without renal calculi, focal lesion, or hydronephrosis. Bladder is unremarkable. Stomach/Bowel: The stomach, small bowel and large bowel are normal in caliber without abnormal wall thickening or surrounding inflammatory  changes. Reproductive: Prostate is unremarkable. Lymphatic: No enlarged lymph nodes in the abdomen or pelvis. Other: Mild volume ascites in the dependent portion of the pelvis. Musculoskeletal: No aggressive osseous lesions. The soft tissues are unremarkable. IMPRESSION: VASCULAR 1. Again seen are similar filling defects of the descending thoracic aorta and infrarenal abdominal aorta measuring up to 8 mm, not significantly  changed in size or appearance. Findings could represent either in Situ thrombus or thromboembolic disease. No new filling defects of the aorta or its branch vessels are identified on this exam. NON-VASCULAR 1. New consolidation of the right lower lobe, more completely evaluated on same day CTA chest, suggestive of infectious pneumonia. 2. Mild volume ascites in the dependent portion of the pelvis. Electronically Signed   By: Olive Bass M.D.   On: 03/21/2023 12:08   CT Angio Chest Pulmonary Embolism (PE) W or WO Contrast  Result Date: 03/21/2023 CLINICAL DATA:  Pulmonary embolism suspected, high probability. Abdominal aortic thrombus. EXAM: CT ANGIOGRAPHY CHEST WITH CONTRAST TECHNIQUE: Multidetector CT imaging of the chest was performed using the standard protocol during bolus administration of intravenous contrast. Multiplanar CT image reconstructions and MIPs were obtained to evaluate the vascular anatomy. RADIATION DOSE REDUCTION: This exam was performed according to the departmental dose-optimization program which includes automated exposure control, adjustment of the mA and/or kV according to patient size and/or use of iterative reconstruction technique. CONTRAST:  OMNIPAQUE IOHEXOL 350 MG/ML SOLN COMPARISON:  One-view chest x-ray 03/19/2023. FINDINGS: Cardiovascular: The heart size is normal. No significant pericardial effusion is present. Aortic arch and great vessel origins are within normal limits. Nonocclusive thrombus is noted along the left wall of the descending thoracic  aorta near the diaphragm. Pulmonary artery opacification is excellent. No focal filling defects are present to suggest pulmonary embolus. Mediastinum/Nodes: No enlarged mediastinal, hilar, or axillary lymph nodes. Thyroid gland, trachea, and esophagus demonstrate no significant findings. Lungs/Pleura: Centrilobular and paraseptal emphysematous changes are noted bilaterally. A large pleural globe is present anterior to the mediastinum adjacent to the left upper lobe. Focal airspace opacity is present in the medial right lower lobe. Mild dependent atelectasis is noted bilaterally. No other focal airspace disease is present. No nodule or mass lesion is present. No significant pleural effusion or pneumothorax is present. Upper Abdomen: Limited imaging the abdomen is unremarkable. There is no significant adenopathy. No solid organ lesions are present. Musculoskeletal: Vertebral body heights and alignment are normal. No focal osseous lesions are present. The ribs are unremarkable. Review of the MIP images confirms the above findings. IMPRESSION: 1. No pulmonary embolus. 2. Nonocclusive thrombus along the left wall of the descending thoracic aorta near the diaphragm. 3. Focal airspace opacity in the medial right lower lobe concerning for pneumonia. 4.  Emphysema (ICD10-J43.9). Electronically Signed   By: Marin Roberts M.D.   On: 03/21/2023 11:44    Cardiac Studies   TTE 03/20/2023 IMPRESSIONS   1. Left ventricular ejection fraction, by estimation, is 45 to 50%. The  left ventricle has mildly decreased function. The left ventricle has no  regional wall motion abnormalities. There is mild concentric left  ventricular hypertrophy. Left ventricular  diastolic parameters are consistent with Grade I diastolic dysfunction  (impaired relaxation).   2. No LV apical thrombus.   3. RV apical hypokinesis. Right ventricular systolic function is mildly  reduced. The right ventricular size is normal.   4. RV  Mconnell's Sign   5. A small pericardial effusion is present. The pericardial effusion is  anterior to the right ventricle.   6. The mitral valve is normal in structure. Mild mitral valve  regurgitation. No evidence of mitral stenosis.   7. The aortic valve is tricuspid. There is mild thickening of the aortic  valve. Aortic valve regurgitation is not visualized.   8. Echodensity seen in descending aorta-in clinical correlation to CT  consistent with thrombus.  9. The inferior vena cava is normal in size with greater than 50%  respiratory variability, suggesting right atrial pressure of 3 mmHg.   Comparison(s): No prior Echocardiogram.   Conclusion(s)/Recommendation(s): Consider CTPE if clinically indicated.  Discussed with primary team; caridology to consult.   FINDINGS   Left Ventricle: Left ventricular ejection fraction, by estimation, is 45  to 50%. The left ventricle has mildly decreased function. The left  ventricle has no regional wall motion abnormalities. The left ventricular  internal cavity size was normal in  size. There is mild concentric left ventricular hypertrophy. Left  ventricular diastolic parameters are consistent with Grade I diastolic  dysfunction (impaired relaxation).     LV Wall Scoring:  The apical inferior segment is hypokinetic.   Right Ventricle: RV apical hypokinesis. The right ventricular size is  normal. No increase in right ventricular wall thickness. Right ventricular  systolic function is mildly reduced.   Left Atrium: Left atrial size was normal in size.   Right Atrium: Right atrial size was normal in size.   Pericardium: A small pericardial effusion is present. The pericardial  effusion is anterior to the right ventricle.   Mitral Valve: The mitral valve is normal in structure. Mild mitral valve  regurgitation. No evidence of mitral valve stenosis.   Tricuspid Valve: The tricuspid valve is normal in structure. Tricuspid  valve  regurgitation is not demonstrated. No evidence of tricuspid  stenosis.   Aortic Valve: The aortic valve is tricuspid. There is mild thickening of  the aortic valve. Aortic valve regurgitation is not visualized. Aortic  valve mean gradient measures 1.0 mmHg. Aortic valve peak gradient measures  2.0 mmHg. Aortic valve area, by VTI   measures 3.35 cm.   Pulmonic Valve: The pulmonic valve was normal in structure. Pulmonic valve  regurgitation is not visualized. No evidence of pulmonic stenosis.   Aorta: Echodensity seen in descending aorta-in clinical correlation to CT  consistent with thrombus. The aortic root and ascending aorta are  structurally normal, with no evidence of dilitation.   Venous: The inferior vena cava is normal in size with greater than 50%  respiratory variability, suggesting right atrial pressure of 3 mmHg.   IAS/Shunts: No atrial level shunt detected by color flow Doppler.    Patient Profile     52 y.o. 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  He has been transitioned to Xarelto. No need for any further cardiovascular testing at this time. Ideally will need low-dose beta-blocker but systolic blood pressure on the lower side.  Will need outpatient follow-up for repeat echocardiogram.     For questions or updates, please contact [facility] Please consult [email or web address] for contact info under Cardiology/STEMI.      Signed, Thomasene Ripple, DO  03/23/2023, 9:11 AM

## 2023-03-23 NOTE — Inpatient Diabetes Management (Signed)
Inpatient Diabetes Program Recommendations  AACE/ADA: New Consensus Statement on Inpatient Glycemic Control (2015)  Target Ranges:  Prepandial:   less than 140 mg/dL      Peak postprandial:   less than 180 mg/dL (1-2 hours)      Critically ill patients:  140 - 180 mg/dL   Lab Results  Component Value Date   GLUCAP 119 (H) 03/23/2023   HGBA1C 14.2 (H) 03/22/2023    Review of Glycemic Control  Latest Reference Range & Units 03/22/23 20:45 03/23/23 05:57 03/23/23 06:15 03/23/23 06:36  Glucose-Capillary 70 - 99 mg/dL 962 (H) 54 (L) 68 (L) 952 (H)  (H): Data is abnormally high (L): Data is abnormally low Diabetes history: Type 1 DM Home DM Meds: Novolog 1-18 units QID                              Levemir 8 units QHS Current orders for Inpatient glycemic control: Novolog 0-9 units TID & HS, Levemir 4 units QHS   Of note: Patient's 6th admission for DKA since January. DM coordinators have spoken with patient and patient's mother on 01/13/23 & 02/10/23  Inpatient Diabetes Program Recommendations:    Noted hypoglycemia this AM of 54 mg/dL and associated reduction to basal. In agreement. Patient was elevated yesterday afternoon due to late dose of Semglee at QHS and required more correction.  Consider adding Novolog 2 units TID (assuming patient consuming >50% of meals). Anticipate need for decrease to home doses prior to discharge.   Thanks, Lujean Rave, MSN, RNC-OB Diabetes Coordinator [phone] (8a-5p)

## 2023-03-23 NOTE — Plan of Care (Signed)

## 2023-03-23 NOTE — Progress Notes (Addendum)
  Progress Note    03/23/2023 7:50 AM  Subjective:  no new complaints   Vitals:   03/23/23 0500 03/23/23 0735  BP:    Pulse: 81   Resp: (!) 21   Temp:  (!) 97.5 F (36.4 C)  SpO2: 100%    Physical Exam: Lungs:  non labored Extremities:  palpable radial and DP pulses Abdomen:  soft, NT Neurologic: A&O  CBC    Component Value Date/Time   WBC 6.2 03/23/2023 0132   RBC 3.12 (L) 03/23/2023 0132   HGB 10.3 (L) 03/23/2023 0132   HCT 31.0 (L) 03/23/2023 0132   PLT 185 03/23/2023 0132   MCV 99.4 03/23/2023 0132   MCH 33.0 03/23/2023 0132   MCHC 33.2 03/23/2023 0132   RDW 13.1 03/23/2023 0132   LYMPHSABS 2.3 03/21/2023 0032   MONOABS 0.4 03/21/2023 0032   EOSABS 0.1 03/21/2023 0032   BASOSABS 0.0 03/21/2023 0032    BMET    Component Value Date/Time   NA 128 (L) 03/22/2023 0205   K 3.7 03/22/2023 0205   CL 92 (L) 03/22/2023 0205   CO2 26 03/22/2023 0205   GLUCOSE 146 (H) 03/22/2023 0205   BUN 11 03/22/2023 0205   CREATININE 0.81 03/22/2023 0205   CALCIUM 8.2 (L) 03/22/2023 0205   GFRNONAA >60 03/22/2023 0205    INR No results found for: "INR"   Intake/Output Summary (Last 24 hours) at 03/23/2023 0750 Last data filed at 03/23/2023 0737 Gross per 24 hour  Intake 3330.14 ml  Output 5200 ml  Net -1869.86 ml     Assessment/Plan:  52 y.o. male with non occlusive aortic thrombus  Exam unchanged. No signs or symptoms of malperfusion.  Palpable DP pulses.  Transitioned to Xarelto yesterday.  Our office will arrange repeat CTA in 1 month.   Emilie Rutter, PA-C [facility] [phone] 03/23/2023 7:50 AM  I have seen and evaluated the patient. I agree with the PA note as documented above.  DOAC at discharge as previously discussed.  Repeat CTA this admission shows no interval change of nonocclusive thrombus in the thoracic and abdominal aorta.  No signs of malperfusion.  Will repeat imaging in 1 month in the office with CTA chest abdomen  pelvis.  Needs a minimum of 3 months of anticoagulation given acute appearance of thrombus without malperfusion.  Will determine long-term course pending follow-up imaging.  Cephus Shelling, MD [facility] [place] Office: [phone]

## 2023-03-24 ENCOUNTER — Other Ambulatory Visit: Payer: Self-pay | Admitting: Nurse Practitioner

## 2023-03-24 LAB — CULTURE, BLOOD (ROUTINE X 2): Culture: NO GROWTH

## 2023-03-24 MED ORDER — TRAZODONE HCL 50 MG PO TABS: 50.0000 mg | ORAL_TABLET | Freq: Every day | ORAL | 2 refills | Status: DC

## 2023-03-24 NOTE — Progress Notes (Signed)
[place] for insomia not covered by Medicaid- Trazadone 50 mg HS ubstituted

## 2023-03-25 ENCOUNTER — Encounter (HOSPITAL_COMMUNITY): Payer: Self-pay

## 2023-03-25 ENCOUNTER — Emergency Department (HOSPITAL_COMMUNITY)
Admission: EM | Admit: 2023-03-25 | Discharge: 2023-03-25 | Disposition: A | Discharge status: Home / Self Care | Payer: Medicaid Other | Attending: Emergency Medicine | Admitting: Emergency Medicine

## 2023-03-25 ENCOUNTER — Other Ambulatory Visit: Payer: Self-pay

## 2023-03-25 ENCOUNTER — Emergency Department (HOSPITAL_COMMUNITY): Payer: Medicaid Other

## 2023-03-25 DIAGNOSIS — R202 Paresthesia of skin: Secondary | ICD-10-CM | POA: Insufficient documentation

## 2023-03-25 DIAGNOSIS — E10649 Type 1 diabetes mellitus with hypoglycemia without coma: Secondary | ICD-10-CM | POA: Diagnosis not present

## 2023-03-25 DIAGNOSIS — Z7982 Long term (current) use of aspirin: Secondary | ICD-10-CM | POA: Diagnosis not present

## 2023-03-25 DIAGNOSIS — Z7901 Long term (current) use of anticoagulants: Secondary | ICD-10-CM | POA: Insufficient documentation

## 2023-03-25 DIAGNOSIS — Z794 Long term (current) use of insulin: Secondary | ICD-10-CM | POA: Insufficient documentation

## 2023-03-25 LAB — I-STAT VENOUS BLOOD GAS, ED
Acid-Base Excess: 4 mmol/L — ABNORMAL HIGH (ref 0.0–2.0)
Bicarbonate: 28.9 mmol/L — ABNORMAL HIGH (ref 20.0–28.0)
Calcium, Ion: 1.08 mmol/L — ABNORMAL LOW (ref 1.15–1.40)
HCT: 40 % (ref 39.0–52.0)
Hemoglobin: 13.6 g/dL (ref 13.0–17.0)
O2 Saturation: 99 %
Potassium: 4.2 mmol/L (ref 3.5–5.1)
Sodium: 132 mmol/L — ABNORMAL LOW (ref 135–145)
TCO2: 30 mmol/L (ref 22–32)
pCO2, Ven: 45.2 mmHg (ref 44–60)
pH, Ven: 7.413 (ref 7.25–7.43)
pO2, Ven: 160 mmHg — ABNORMAL HIGH (ref 32–45)

## 2023-03-25 LAB — CBC WITH DIFFERENTIAL/PLATELET
Abs Immature Granulocytes: 0.11 10*3/uL — ABNORMAL HIGH (ref 0.00–0.07)
Basophils Absolute: 0 10*3/uL (ref 0.0–0.1)
Basophils Relative: 0 %
Eosinophils Absolute: 0 10*3/uL (ref 0.0–0.5)
Eosinophils Relative: 0 %
HCT: 35.4 % — ABNORMAL LOW (ref 39.0–52.0)
Hemoglobin: 12.3 g/dL — ABNORMAL LOW (ref 13.0–17.0)
Immature Granulocytes: 2 %
Lymphocytes Relative: 21 %
Lymphs Abs: 1.5 10*3/uL (ref 0.7–4.0)
MCH: 34.3 pg — ABNORMAL HIGH (ref 26.0–34.0)
MCHC: 34.7 g/dL (ref 30.0–36.0)
MCV: 98.6 fL (ref 80.0–100.0)
Monocytes Absolute: 0.3 10*3/uL (ref 0.1–1.0)
Monocytes Relative: 5 %
Neutro Abs: 5.2 10*3/uL (ref 1.7–7.7)
Neutrophils Relative %: 72 %
Platelets: 320 10*3/uL (ref 150–400)
RBC: 3.59 MIL/uL — ABNORMAL LOW (ref 4.22–5.81)
RDW: 13 % (ref 11.5–15.5)
WBC: 7.2 10*3/uL (ref 4.0–10.5)
nRBC: 0 % (ref 0.0–0.2)

## 2023-03-25 LAB — URINALYSIS, ROUTINE W REFLEX MICROSCOPIC
Bilirubin Urine: NEGATIVE
Glucose, UA: NEGATIVE mg/dL
Hgb urine dipstick: NEGATIVE
Ketones, ur: NEGATIVE mg/dL
Leukocytes,Ua: NEGATIVE
Nitrite: NEGATIVE
Protein, ur: NEGATIVE mg/dL
Specific Gravity, Urine: 1.009 (ref 1.005–1.030)
pH: 7 (ref 5.0–8.0)

## 2023-03-25 LAB — COMPREHENSIVE METABOLIC PANEL
ALT: 127 U/L — ABNORMAL HIGH (ref 0–44)
AST: 148 U/L — ABNORMAL HIGH (ref 15–41)
Albumin: 4 g/dL (ref 3.5–5.0)
Alkaline Phosphatase: 219 U/L — ABNORMAL HIGH (ref 38–126)
Anion gap: 15 (ref 5–15)
BUN: 18 mg/dL (ref 6–20)
CO2: 24 mmol/L (ref 22–32)
Calcium: 9.1 mg/dL (ref 8.9–10.3)
Chloride: 93 mmol/L — ABNORMAL LOW (ref 98–111)
Creatinine, Ser: 0.79 mg/dL (ref 0.61–1.24)
GFR, Estimated: >60 mL/min (ref >60)
Glucose, Bld: 100 mg/dL — ABNORMAL HIGH (ref 70–99)
Potassium: 4.2 mmol/L (ref 3.5–5.1)
Sodium: 132 mmol/L — ABNORMAL LOW (ref 135–145)
Total Bilirubin: 0.8 mg/dL (ref 0.3–1.2)
Total Protein: 7 g/dL (ref 6.5–8.1)

## 2023-03-25 LAB — BETA-HYDROXYBUTYRIC ACID: Beta-Hydroxybutyric Acid: 0.43 mmol/L — ABNORMAL HIGH (ref 0.05–0.27)

## 2023-03-25 LAB — VITAMIN B12: Vitamin B-12: 1003 pg/mL — ABNORMAL HIGH (ref 180–914)

## 2023-03-25 LAB — CBG MONITORING, ED
Glucose-Capillary: 84 mg/dL (ref 70–99)
Glucose-Capillary: 312 mg/dL — ABNORMAL HIGH (ref 70–99)

## 2023-03-25 LAB — APTT: aPTT: 27 seconds (ref 24–36)

## 2023-03-25 LAB — ETHANOL: Alcohol, Ethyl (B): <10 mg/dL (ref <10)

## 2023-03-25 LAB — PROTIME-INR
INR: 1 (ref 0.8–1.2)
Prothrombin Time: 13.4 seconds (ref 11.4–15.2)

## 2023-03-25 MED ORDER — LACTATED RINGERS IV BOLUS
2000.0000 mL | Freq: Once | INTRAVENOUS | Status: AC
  Administered 2023-03-25: 1000 mL via INTRAVENOUS

## 2023-03-25 MED ORDER — DEXTROSE 50 % IV SOLN
12.5000 g | Freq: Once | INTRAVENOUS | Status: AC
  Administered 2023-03-25: 12.5 g via INTRAVENOUS
  Filled 2023-03-25: qty 50

## 2023-03-25 NOTE — ED Notes (Signed)
BGL 80 on arrival

## 2023-03-25 NOTE — ED Provider Notes (Signed)
Medical screening examination/treatment/procedure(s) were conducted as a shared visit with non-physician practitioner(s) and myself.  I personally evaluated the patient during the encounter.  Clinical Impression:   Final diagnoses:  None      This patient is a chronically ill-appearing 52 year old male currently living with his parents, known history of poorly controlled diabetes and recurrent admissions for DKA.  Recently admitted to the hospital over the last week and discharged on Eliquis for aortic clots, he has not yet filled this medication as the pharmacy did not have it.  He has been taking his other medications including his diabetic medications and presents after having some tingling in his toes overnight, was found to be relatively hypoglycemic with a blood sugar of 70, given some soda by family, his blood sugar is around 80 on arrival, on my exam the patient is not tachycardic or ill-appearing, he is able to follow commands and has normal level of alertness.  Blood pressure is around 120/70, there is no edema, soft abdomen, clear lungs, he does have evidence of a head injury with old bruising around the left eye with a cut on the skin which is healing by secondary intent.  This does not appear new as the ecchymosis has changed to more of a yellowish-brownish hue.  Review of the medical record shows no CT scans of the head were performed during the prior evaluation, this will be done today.  Labs will also be ordered and the patient's blood sugar will be monitored.   Eber Hong, MD 03/26/23 [phone]

## 2023-03-25 NOTE — ED Triage Notes (Signed)
Pt c/o numbness since he awoke, family states his BGL was 70 at 0500, they gave him a dr pepper

## 2023-03-25 NOTE — Discharge Instructions (Addendum)
It was a pleasure taking care of you today. Lab work was less concerning for DKA at this time. Head imaging was not concerning for problems related to fall from last week. No signs of infection in vital signs or blood work.   Seek emergency care if experiencing new or worsening symptoms.

## 2023-03-25 NOTE — ED Notes (Signed)
Pt has speech difficult that family states is baseline.  Pt does have bruising to left eye and left temple.  Small laceration to left eyebrow that has scabbed over.

## 2023-03-25 NOTE — ED Notes (Signed)
Patient transported to CT 

## 2023-03-25 NOTE — ED Provider Notes (Addendum)
[place] EMERGENCY DEPARTMENT AT [facility] Provider Note   CSN: [id number] Arrival date & time: 03/25/23  1478     History  Chief Complaint  Patient presents with   Numbness   Hypoglycemia    Tom Anderson is a 52 y.o. male with past medical history of DM type 1 recently hospitalized for DKA (discharged 03/23/23) who presents to ED complaining of hypoglycemia and intermittent bilateral foot numbness since yesterday afternoon. Patient then woke up this morning and states that he was not able to make it to the bathroom due to leg weakness. Patient is worried because these are the same symptoms that he had on 03/19/23 that he was admitted for. Also endorses nausea. Denies vomiting and abdominal pain.   Hypoglycemia Associated symptoms: weakness        Home Medications Prior to Admission medications   Medication Sig Start Date End Date Taking? Authorizing Provider  feeding supplement, GLUCERNA SHAKE, (GLUCERNA SHAKE) LIQD Take 237 mLs by mouth 2 (two) times daily between meals. 01/15/23  Yes [name], MD  insulin aspart (NOVOLOG) 100 UNIT/ML FlexPen For glucose 121 to 150 use 1 unit, for 151 to 200 use 2 units, for 201 to 250 use 3 units, for 251 to 300 use 5 units, for 301 to 350 use 7 units for 351 or greater use 9 units. Patient taking differently: Inject 1-18 Units into the skin in the morning, at noon, in the evening, and at bedtime. 10/07/20  Yes [name], [name], MD  insulin detemir (LEVEMIR) 100 UNIT/ML FlexPen Inject 8 Units into the skin at bedtime. 03/23/23  Yes Rai, Ripudeep K, MD  traZODone (DESYREL) 50 MG tablet Take 1 tablet (50 mg total) by mouth at bedtime. Patient not taking: Reported on 03/25/2023 03/24/23   Russella Dar, NP  ARIPiprazole (ABILIFY) 10 MG tablet Take 1 tablet (10 mg total) by mouth daily. Patient not taking: Reported on 03/25/2023 03/23/23   Cathren Harsh, MD  aspirin 81 MG chewable tablet Chew 1 tablet (81 mg total) by mouth  daily. Patient not taking: Reported on 03/25/2023 03/23/23   Rai, Delene Ruffini, MD  folic acid (FOLVITE) 1 MG tablet Take 1 tablet (1 mg total) by mouth daily. Patient not taking: Reported on 03/25/2023 03/23/23   Rai, Delene Ruffini, MD  metoCLOPramide (REGLAN) 5 MG tablet Take 1 tablet (5 mg total) by mouth every 8 (eight) hours as needed for nausea or vomiting. Patient not taking: Reported on 03/25/2023 03/23/23   Cathren Harsh, MD  Multiple Vitamin (MULTIVITAMIN WITH MINERALS) TABS tablet Take 1 tablet by mouth daily. Patient not taking: Reported on 03/25/2023 03/23/23   Rai, Delene Ruffini, MD  pantoprazole (PROTONIX) 40 MG tablet Take 1 tablet (40 mg total) by mouth daily. Patient not taking: Reported on 03/25/2023 03/23/23   Cathren Harsh, MD  rivaroxaban (XARELTO) 20 MG TABS tablet Take 1 tablet (20 mg total) by mouth daily with supper. Take this when the xarelto starter pack is completed. Patient not taking: Reported on 03/25/2023 04/12/23   Cathren Harsh, MD  RIVAROXABAN Carlena Hurl) VTE STARTER PACK (15 & 20 MG) Follow package directions: Take one 15mg  tablet by mouth twice a day. On day 22 (04/12/2023), switch to one 20mg  tablet once a day. Take with food. Patient not taking: Reported on 03/25/2023 03/23/23   Cathren Harsh, MD      Allergies    Nitrous oxide    Review of Systems   Review  of Systems  Neurological:  Positive for weakness.    Physical Exam Updated Vital Signs BP 119/83   Pulse 96   Temp 97.7 F (36.5 C) (Axillary)   Resp 17   Ht  (1.651 m)   Wt 45.4 kg   SpO2 100%   BMI 16.64 kg/m  Physical Exam Vitals and nursing note reviewed.  Constitutional:      General: He is not in acute distress. HENT:     Head: Normocephalic.     Comments: Contusion present over left eye from prior  fall last week.    Mouth/Throat:     Mouth: Mucous membranes are moist.     Pharynx: No oropharyngeal exudate or posterior oropharyngeal erythema.  Eyes:     General: No scleral icterus.        Right eye: No discharge.        Left eye: No discharge.     Conjunctiva/sclera: Conjunctivae normal.  Cardiovascular:     Rate and Rhythm: Normal rate and regular rhythm.     Pulses: Normal pulses.     Heart sounds: Normal heart sounds. No murmur heard. Pulmonary:     Effort: Pulmonary effort is normal. No respiratory distress.     Breath sounds: Normal breath sounds. No wheezing, rhonchi or rales.  Abdominal:     General: Abdomen is flat. Bowel sounds are normal. There is no distension.     Palpations: Abdomen is soft.     Tenderness: There is no abdominal tenderness.  Musculoskeletal:     Right lower leg: No edema.     Left lower leg: No edema.  Skin:    General: Skin is warm and dry.     Findings: No rash.  Neurological:     General: No focal deficit present.     Mental Status: He is alert. Mental status is at baseline.     Cranial Nerves: No cranial nerve deficit.     Sensory: No sensory deficit.     Motor: No weakness.  Psychiatric:        Mood and Affect: Mood normal.     ED Results / Procedures / Treatments   Labs (all labs ordered are listed, but only abnormal results are displayed) Labs Reviewed  CBC WITH DIFFERENTIAL/PLATELET - Abnormal; Notable for the following components:      Result Value   RBC 3.59 (*)    Hemoglobin 12.3 (*)    HCT 35.4 (*)    MCH 34.3 (*)    Abs Immature Granulocytes 0.11 (*)    All other components within normal limits  BETA-HYDROXYBUTYRIC ACID - Abnormal; Notable for the following components:   Beta-Hydroxybutyric Acid 0.43 (*)    All other components within normal limits  COMPREHENSIVE METABOLIC PANEL - Abnormal; Notable for the following components:   Sodium 132 (*)    Chloride 93 (*)    Glucose, Bld 100 (*)    AST 148 (*)    ALT 127 (*)    Alkaline Phosphatase 219 (*)    All other components within normal limits  VITAMIN B12 - Abnormal; Notable for the following components:   Vitamin B-12 1,003 (*)    All other  components within normal limits  I-STAT VENOUS BLOOD GAS, ED - Abnormal; Notable for the following components:   pO2, Ven 160 (*)    Bicarbonate 28.9 (*)    Acid-Base Excess 4.0 (*)    Sodium 132 (*)    Calcium, Ion 1.08 (*)  All other components within normal limits  URINALYSIS, ROUTINE W REFLEX MICROSCOPIC  ETHANOL  PROTIME-INR  APTT  RAPID URINE DRUG SCREEN, HOSP PERFORMED  CBG MONITORING, ED  CBG MONITORING, ED    EKG None  Radiology CT Cervical Spine Wo Contrast  Result Date: 03/25/2023 CLINICAL DATA:  Trauma, paresthesia, numbness EXAM: CT CERVICAL SPINE WITHOUT CONTRAST TECHNIQUE: Multidetector CT imaging of the cervical spine was performed without intravenous contrast. Multiplanar CT image reconstructions were also generated. RADIATION DOSE REDUCTION: This exam was performed according to the departmental dose-optimization program which includes automated exposure control, adjustment of the mA and/or kV according to patient size and/or use of iterative reconstruction technique. COMPARISON:  None Available. FINDINGS: Alignment: There is reversal of lordosis. There is minimal anterolisthesis at C2-C3 and C4-C5 levels which may be due to previous ligament injury. There is mild levoscoliosis. Skull base and vertebrae: No recent fracture is seen. Degenerative changes are noted. Small sclerotic density in the body of T1 vertebra may suggest bone island. Soft tissues and spinal canal: There is mild extrinsic pressure over the ventral margin of thecal sac caused by posterior bony spurs. There is no significant spinal stenosis. Disc levels: There is encroachment of neural foramina by bony spurs at C2-C3, C3-C4, C4-C5 levels. 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 Other: None. IMPRESSION: No recent fracture is seen in cervical spine.  Cervical spondylosis. Electronically Signed   By: Ernie Avena M.D.   On: 03/25/2023 08:18   CT Head Wo Contrast  Result Date:  03/25/2023 CLINICAL DATA:  Neurological deficit, numbness EXAM: CT HEAD WITHOUT CONTRAST TECHNIQUE: Contiguous axial images were obtained from the base of the skull through the vertex without intravenous contrast. RADIATION DOSE REDUCTION: This exam was performed according to the departmental dose-optimization program which includes automated exposure control, adjustment of the mA and/or kV according to patient size and/or use of iterative reconstruction technique. COMPARISON:  03/11/2023 FINDINGS: Brain: No acute intracranial findings are seen. There are no signs of bleeding within the cranium. There is no focal edema or mass effect. 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 Ventricles are not dilated. Cortical sulci are slightly prominent. Vascular: Unremarkable. Skull: No acute findings are seen. Sinuses/Orbits: There is mild mucosal thickening in ethmoid and right maxillary sinuses. Other: None IMPRESSION: No acute intracranial findings are seen in noncontrast CT brain. Electronically Signed   By: Ernie Avena M.D.   On: 03/25/2023 08:05    Procedures Procedures    Medications Ordered in ED Medications  lactated ringers bolus 2,000 mL (0 mLs Intravenous Stopped 03/25/23 0840)  dextrose 50 % solution 12.5 g (12.5 g Intravenous Given 03/25/23 1610)    ED Course/ Medical Decision Making/ A&P                             Medical Decision Making Amount and/or Complexity of Data Reviewed Labs: ordered. Radiology: ordered.  Risk Prescription drug management.   This patient presents to the ED for concern of AMS, this involves an extensive number of treatment options, and is a complaint that carries with it a high risk of complications and morbidity.  The differential diagnosis includes CVA, ICH, intracranial mass, critical dehydration, heptatic dysfunction, intoxication/withdrawal, DKA, sepsis/infection.   Co morbidities that complicate the  patient evaluation  DM   Additional history obtained:  none   Lab Tests:  I Ordered, and personally interpreted labs.  The pertinent results include: CBC: no concern for leukocytosis; mild anemia (12.3) POCGlucose: 84 CMP: no concern for electrolyte abnormalities; elevated liver enzymes UA: so concern for infection; negative ketones UDS: negative ETOH: negative Beta-hydroxybutyric acid: mildly elevated 0.43 APTT/INR: within normal limits   Imaging Studies ordered:  I ordered imaging studies including CT head and cervical spine - No acute intracranial findings are seen in noncontrast CT brain.  - No recent fracture is seen in cervical spine. Cervical spondylosis.  I independently visualized and interpreted imaging  Shared findings with patient I agree with the radiologist interpretation    Problem List / ED Course / Critical interventions / Medication management   11:00 AM Patient presents to ED complaining of hypoglycemia (BG ~70 at home) and intermittent bilateral foot numbness. BG on arrival 84. Obtaining imaging since patient had fall last week without head imaging at that time. Patient endorses not drinking enough fluids at home. 1L fluids given in ED for possible DKA vs mild dehydration.  11:00 AM Imaging without concern for acute processes. Liver enzymes elevated, but appear to be decreasing from last visit. Hepatitis panel from 4/20 negative. Blood labs and vitals without concern for infection/sepsis/DKA at this time. Patient endorses quitting alcohol >42mo ago - along with ETOH levels makes withdrawal less likely. Talked to patient and shared these results. We will obtain patient's blood glucose one more time and assess for discharge.  Patient's family members mention that they are having a hard time caring for patient and asked for resources. I have submitted consult for home health/social work Patient was given return precautions. Patient stable for discharge at  this time. Patient verbalized understanding of plan.   DDx: These were considered less likely due to history of present illness and physical exam findings CVA/ICH/intracranial mass: CT without concern critical dehydration/heptatic dysfunction: CMP without concern; liver enzymes downtrending from last visit Sepsis: patient afebrile with stable vitals   Social Determinants of Health:  none           Final Clinical Impression(s) / ED Diagnoses Final diagnoses:  Paresthesias    Rx / DC Orders ED Discharge Orders     None         Margarita Rana 03/25/23 1102    [address], [place] 03/25/23 1214    Eber Hong, MD 03/26/23 [phone]

## 2023-04-06 ENCOUNTER — Other Ambulatory Visit: Payer: Self-pay

## 2023-04-06 DIAGNOSIS — I8290 Acute embolism and thrombosis of unspecified vein: Secondary | ICD-10-CM

## 2023-04-22 ENCOUNTER — Ambulatory Visit (HOSPITAL_COMMUNITY): Admission: RE | Admit: 2023-04-22 | Payer: Medicaid Other | Source: Ambulatory Visit

## 2023-04-23 ENCOUNTER — Telehealth: Payer: Self-pay

## 2023-04-23 NOTE — Telephone Encounter (Signed)
Pt called to confirm his appt with MD next week. He did not realize until our call that he forgot he had CT scheduled yesterday. He is going to r/s that and I have r/s his MD appt for June 25th. Pt is aware and verbalized understanding.

## 2023-04-27 ENCOUNTER — Ambulatory Visit: Payer: Medicaid Other | Admitting: Vascular Surgery

## 2023-04-30 ENCOUNTER — Emergency Department (HOSPITAL_BASED_OUTPATIENT_CLINIC_OR_DEPARTMENT_OTHER): Payer: Medicaid Other

## 2023-04-30 ENCOUNTER — Encounter (HOSPITAL_BASED_OUTPATIENT_CLINIC_OR_DEPARTMENT_OTHER): Payer: Self-pay | Admitting: Emergency Medicine

## 2023-04-30 ENCOUNTER — Inpatient Hospital Stay (HOSPITAL_BASED_OUTPATIENT_CLINIC_OR_DEPARTMENT_OTHER)
Admission: EM | Admit: 2023-04-30 | Discharge: 2023-05-02 | DRG: 637 | Disposition: A | Discharge status: Home / Self Care | Payer: Medicaid Other | Attending: Internal Medicine | Admitting: Internal Medicine

## 2023-04-30 ENCOUNTER — Other Ambulatory Visit: Payer: Self-pay

## 2023-04-30 DIAGNOSIS — D72828 Other elevated white blood cell count: Secondary | ICD-10-CM | POA: Diagnosis present

## 2023-04-30 DIAGNOSIS — Z833 Family history of diabetes mellitus: Secondary | ICD-10-CM

## 2023-04-30 DIAGNOSIS — Z79899 Other long term (current) drug therapy: Secondary | ICD-10-CM

## 2023-04-30 DIAGNOSIS — G928 Other toxic encephalopathy: Secondary | ICD-10-CM | POA: Diagnosis present

## 2023-04-30 DIAGNOSIS — E871 Hypo-osmolality and hyponatremia: Secondary | ICD-10-CM | POA: Diagnosis present

## 2023-04-30 DIAGNOSIS — T383X6A Underdosing of insulin and oral hypoglycemic [antidiabetic] drugs, initial encounter: Secondary | ICD-10-CM | POA: Diagnosis present

## 2023-04-30 DIAGNOSIS — K760 Fatty (change of) liver, not elsewhere classified: Secondary | ICD-10-CM | POA: Diagnosis present

## 2023-04-30 DIAGNOSIS — E1010 Type 1 diabetes mellitus with ketoacidosis without coma: Principal | ICD-10-CM | POA: Diagnosis present

## 2023-04-30 DIAGNOSIS — F17210 Nicotine dependence, cigarettes, uncomplicated: Secondary | ICD-10-CM | POA: Diagnosis present

## 2023-04-30 DIAGNOSIS — R7401 Elevation of levels of liver transaminase levels: Secondary | ICD-10-CM

## 2023-04-30 DIAGNOSIS — Z91148 Patient's other noncompliance with medication regimen for other reason: Secondary | ICD-10-CM | POA: Diagnosis not present

## 2023-04-30 DIAGNOSIS — N179 Acute kidney failure, unspecified: Secondary | ICD-10-CM | POA: Diagnosis present

## 2023-04-30 DIAGNOSIS — Z7901 Long term (current) use of anticoagulants: Secondary | ICD-10-CM | POA: Diagnosis not present

## 2023-04-30 DIAGNOSIS — E1110 Type 2 diabetes mellitus with ketoacidosis without coma: Secondary | ICD-10-CM | POA: Diagnosis present

## 2023-04-30 DIAGNOSIS — E860 Dehydration: Secondary | ICD-10-CM | POA: Diagnosis present

## 2023-04-30 DIAGNOSIS — L89151 Pressure ulcer of sacral region, stage 1: Secondary | ICD-10-CM | POA: Diagnosis present

## 2023-04-30 DIAGNOSIS — E875 Hyperkalemia: Secondary | ICD-10-CM | POA: Diagnosis present

## 2023-04-30 DIAGNOSIS — R64 Cachexia: Secondary | ICD-10-CM | POA: Diagnosis present

## 2023-04-30 DIAGNOSIS — I5022 Chronic systolic (congestive) heart failure: Secondary | ICD-10-CM | POA: Diagnosis present

## 2023-04-30 DIAGNOSIS — Z8616 Personal history of COVID-19: Secondary | ICD-10-CM | POA: Diagnosis not present

## 2023-04-30 DIAGNOSIS — Z91128 Patient's intentional underdosing of medication regimen for other reason: Secondary | ICD-10-CM | POA: Diagnosis not present

## 2023-04-30 DIAGNOSIS — F209 Schizophrenia, unspecified: Secondary | ICD-10-CM | POA: Diagnosis present

## 2023-04-30 DIAGNOSIS — G9340 Encephalopathy, unspecified: Secondary | ICD-10-CM

## 2023-04-30 HISTORY — DX: Presence of other specified devices: Z97.8

## 2023-04-30 LAB — URINALYSIS, ROUTINE W REFLEX MICROSCOPIC
Bilirubin Urine: NEGATIVE
Glucose, UA: >=500 mg/dL — AB
Ketones, ur: >=80 mg/dL — AB
Leukocytes,Ua: NEGATIVE
Nitrite: NEGATIVE
Protein, ur: NEGATIVE mg/dL
Specific Gravity, Urine: 1.02 (ref 1.005–1.030)
pH: 5.5 (ref 5.0–8.0)

## 2023-04-30 LAB — CBC
HCT: 41.3 % (ref 39.0–52.0)
Hemoglobin: 12.9 g/dL — ABNORMAL LOW (ref 13.0–17.0)
MCH: 33.7 pg (ref 26.0–34.0)
MCHC: 31.2 g/dL (ref 30.0–36.0)
MCV: 107.8 fL — ABNORMAL HIGH (ref 80.0–100.0)
Platelets: 372 10*3/uL (ref 150–400)
RBC: 3.83 MIL/uL — ABNORMAL LOW (ref 4.22–5.81)
RDW: 12.8 % (ref 11.5–15.5)
WBC: 11.5 10*3/uL — ABNORMAL HIGH (ref 4.0–10.5)
nRBC: 0 % (ref 0.0–0.2)

## 2023-04-30 LAB — BASIC METABOLIC PANEL
Anion gap: 26 — ABNORMAL HIGH (ref 5–15)
Anion gap: 15 (ref 5–15)
Anion gap: 14 (ref 5–15)
BUN: 28 mg/dL — ABNORMAL HIGH (ref 6–20)
BUN: 22 mg/dL — ABNORMAL HIGH (ref 6–20)
BUN: 20 mg/dL (ref 6–20)
CO2: 16 mmol/L — ABNORMAL LOW (ref 22–32)
CO2: 26 mmol/L (ref 22–32)
CO2: 25 mmol/L (ref 22–32)
Calcium: 10 mg/dL (ref 8.9–10.3)
Calcium: 9.7 mg/dL (ref 8.9–10.3)
Calcium: 9.3 mg/dL (ref 8.9–10.3)
Chloride: 92 mmol/L — ABNORMAL LOW (ref 98–111)
Chloride: 94 mmol/L — ABNORMAL LOW (ref 98–111)
Chloride: 97 mmol/L — ABNORMAL LOW (ref 98–111)
Creatinine, Ser: 1.62 mg/dL — ABNORMAL HIGH (ref 0.61–1.24)
Creatinine, Ser: 1.11 mg/dL (ref 0.61–1.24)
Creatinine, Ser: 0.99 mg/dL (ref 0.61–1.24)
GFR, Estimated: 52 mL/min — ABNORMAL LOW (ref >60)
GFR, Estimated: >60 mL/min (ref >60)
GFR, Estimated: >60 mL/min (ref >60)
Glucose, Bld: 512 mg/dL (ref 70–99)
Glucose, Bld: 206 mg/dL — ABNORMAL HIGH (ref 70–99)
Glucose, Bld: 178 mg/dL — ABNORMAL HIGH (ref 70–99)
Potassium: 3.7 mmol/L (ref 3.5–5.1)
Potassium: 3.8 mmol/L (ref 3.5–5.1)
Potassium: 3.9 mmol/L (ref 3.5–5.1)
Sodium: 134 mmol/L — ABNORMAL LOW (ref 135–145)
Sodium: 135 mmol/L (ref 135–145)
Sodium: 136 mmol/L (ref 135–145)

## 2023-04-30 LAB — GLUCOSE, CAPILLARY
Glucose-Capillary: 597 mg/dL (ref 70–99)
Glucose-Capillary: 599 mg/dL (ref 70–99)
Glucose-Capillary: 360 mg/dL — ABNORMAL HIGH (ref 70–99)
Glucose-Capillary: 258 mg/dL — ABNORMAL HIGH (ref 70–99)
Glucose-Capillary: 197 mg/dL — ABNORMAL HIGH (ref 70–99)
Glucose-Capillary: 203 mg/dL — ABNORMAL HIGH (ref 70–99)
Glucose-Capillary: 178 mg/dL — ABNORMAL HIGH (ref 70–99)
Glucose-Capillary: 156 mg/dL — ABNORMAL HIGH (ref 70–99)
Glucose-Capillary: 169 mg/dL — ABNORMAL HIGH (ref 70–99)

## 2023-04-30 LAB — BLOOD GAS, VENOUS
Acid-base deficit: 1.9 mmol/L (ref 0.0–2.0)
Bicarbonate: 21.9 mmol/L (ref 20.0–28.0)
Drawn by: 164
O2 Saturation: 95.8 %
Patient temperature: 37
pCO2, Ven: 33 mmHg — ABNORMAL LOW (ref 44–60)
pH, Ven: 7.43 (ref 7.25–7.43)
pO2, Ven: 66 mmHg — ABNORMAL HIGH (ref 32–45)

## 2023-04-30 LAB — COMPREHENSIVE METABOLIC PANEL
ALT: 190 U/L — ABNORMAL HIGH (ref 0–44)
AST: 144 U/L — ABNORMAL HIGH (ref 15–41)
Albumin: 4.8 g/dL (ref 3.5–5.0)
Alkaline Phosphatase: 226 U/L — ABNORMAL HIGH (ref 38–126)
BUN: 40 mg/dL — ABNORMAL HIGH (ref 6–20)
CO2: <7 mmol/L — ABNORMAL LOW (ref 22–32)
Calcium: 9.6 mg/dL (ref 8.9–10.3)
Chloride: 80 mmol/L — ABNORMAL LOW (ref 98–111)
Creatinine, Ser: 1.78 mg/dL — ABNORMAL HIGH (ref 0.61–1.24)
GFR, Estimated: 46 mL/min — ABNORMAL LOW (ref >60)
Glucose, Bld: >1200 mg/dL (ref 70–99)
Potassium: 5.5 mmol/L — ABNORMAL HIGH (ref 3.5–5.1)
Sodium: 123 mmol/L — ABNORMAL LOW (ref 135–145)
Total Bilirubin: 1.4 mg/dL — ABNORMAL HIGH (ref 0.3–1.2)
Total Protein: 7.8 g/dL (ref 6.5–8.1)

## 2023-04-30 LAB — I-STAT VENOUS BLOOD GAS, ED
Acid-base deficit: 24 mmol/L — ABNORMAL HIGH (ref 0.0–2.0)
Bicarbonate: 7.1 mmol/L — ABNORMAL LOW (ref 20.0–28.0)
Calcium, Ion: 1.26 mmol/L (ref 1.15–1.40)
HCT: 46 % (ref 39.0–52.0)
Hemoglobin: 15.6 g/dL (ref 13.0–17.0)
O2 Saturation: 56 %
Patient temperature: 96
Potassium: 5.5 mmol/L — ABNORMAL HIGH (ref 3.5–5.1)
Sodium: 120 mmol/L — ABNORMAL LOW (ref 135–145)
TCO2: 8 mmol/L — ABNORMAL LOW (ref 22–32)
pCO2, Ven: 29.7 mmHg — ABNORMAL LOW (ref 44–60)
pH, Ven: 6.98 — CL (ref 7.25–7.43)
pO2, Ven: 41 mmHg (ref 32–45)

## 2023-04-30 LAB — CBG MONITORING, ED
Glucose-Capillary: >600 mg/dL (ref 70–99)
Glucose-Capillary: >600 mg/dL (ref 70–99)
Glucose-Capillary: >600 mg/dL (ref 70–99)

## 2023-04-30 LAB — URINALYSIS, MICROSCOPIC (REFLEX): RBC / HPF: NONE SEEN RBC/hpf (ref 0–5)

## 2023-04-30 LAB — LACTIC ACID, PLASMA
Lactic Acid, Venous: 5.9 mmol/L (ref 0.5–1.9)
Lactic Acid, Venous: >9 mmol/L (ref 0.5–1.9)
Lactic Acid, Venous: >9 mmol/L (ref 0.5–1.9)
Lactic Acid, Venous: 6 mmol/L (ref 0.5–1.9)

## 2023-04-30 LAB — HEPATITIS PANEL, ACUTE
HCV Ab: NONREACTIVE
Hep A IgM: NONREACTIVE
Hep B C IgM: NONREACTIVE
Hepatitis B Surface Ag: NONREACTIVE

## 2023-04-30 LAB — PHOSPHORUS
Phosphorus: 11.9 mg/dL — ABNORMAL HIGH (ref 2.5–4.6)
Phosphorus: 2.3 mg/dL — ABNORMAL LOW (ref 2.5–4.6)

## 2023-04-30 LAB — AMMONIA: Ammonia: 36 umol/L — ABNORMAL HIGH (ref 9–35)

## 2023-04-30 LAB — BETA-HYDROXYBUTYRIC ACID
Beta-Hydroxybutyric Acid: >8 mmol/L — ABNORMAL HIGH (ref 0.05–0.27)
Beta-Hydroxybutyric Acid: 4.45 mmol/L — ABNORMAL HIGH (ref 0.05–0.27)
Beta-Hydroxybutyric Acid: 0.21 mmol/L (ref 0.05–0.27)

## 2023-04-30 LAB — MAGNESIUM
Magnesium: 2.9 mg/dL — ABNORMAL HIGH (ref 1.7–2.4)
Magnesium: 2.2 mg/dL (ref 1.7–2.4)

## 2023-04-30 LAB — ETHANOL: Alcohol, Ethyl (B): <10 mg/dL (ref <10)

## 2023-04-30 LAB — MRSA NEXT GEN BY PCR, NASAL: MRSA by PCR Next Gen: NOT DETECTED

## 2023-04-30 MED ORDER — ALBUTEROL SULFATE (2.5 MG/3ML) 0.083% IN NEBU: 2.5000 mg | INHALATION_SOLUTION | RESPIRATORY_TRACT | Status: DC | PRN

## 2023-04-30 MED ORDER — PANTOPRAZOLE SODIUM 40 MG PO TBEC
40.0000 mg | DELAYED_RELEASE_TABLET | Freq: Every day | ORAL | Status: DC
  Administered 2023-04-30 – 2023-05-02 (×3): 40 mg via ORAL
  Filled 2023-04-30 (×3): qty 1

## 2023-04-30 MED ORDER — RIVAROXABAN 20 MG PO TABS
20.0000 mg | ORAL_TABLET | Freq: Every day | ORAL | Status: DC
  Administered 2023-05-01: 20 mg via ORAL
  Filled 2023-04-30 (×2): qty 1

## 2023-04-30 MED ORDER — ARIPIPRAZOLE 10 MG PO TABS
10.0000 mg | ORAL_TABLET | Freq: Every day | ORAL | Status: DC
  Administered 2023-04-30 – 2023-05-02 (×3): 10 mg via ORAL
  Filled 2023-04-30 (×3): qty 1

## 2023-04-30 MED ORDER — LACTATED RINGERS IV BOLUS
1000.0000 mL | Freq: Once | INTRAVENOUS | Status: AC
  Administered 2023-04-30: 1000 mL via INTRAVENOUS

## 2023-04-30 MED ORDER — LACTATED RINGERS IV SOLN: INTRAVENOUS | Status: DC

## 2023-04-30 MED ORDER — DEXTROSE IN LACTATED RINGERS 5 % IV SOLN: INTRAVENOUS | Status: DC

## 2023-04-30 MED ORDER — LACTATED RINGERS IV BOLUS
500.0000 mL | Freq: Once | INTRAVENOUS | Status: AC
  Administered 2023-04-30: 500 mL via INTRAVENOUS

## 2023-04-30 MED ORDER — THIAMINE HCL 100 MG/ML IJ SOLN
100.0000 mg | Freq: Every day | INTRAMUSCULAR | Status: DC
  Administered 2023-04-30 – 2023-05-01 (×2): 100 mg via INTRAVENOUS
  Filled 2023-04-30 (×2): qty 2

## 2023-04-30 MED ORDER — PIPERACILLIN-TAZOBACTAM 3.375 G IVPB 30 MIN
3.3750 g | Freq: Once | INTRAVENOUS | Status: AC
  Administered 2023-04-30: 3.375 g via INTRAVENOUS
  Filled 2023-04-30: qty 50

## 2023-04-30 MED ORDER — INSULIN REGULAR(HUMAN) IN NACL 100-0.9 UT/100ML-% IV SOLN
INTRAVENOUS | Status: DC
  Administered 2023-04-30: 1.8 [IU]/h via INTRAVENOUS
  Administered 2023-04-30: 8 [IU]/h via INTRAVENOUS
  Filled 2023-04-30: qty 100

## 2023-04-30 MED ORDER — INSULIN REGULAR(HUMAN) IN NACL 100-0.9 UT/100ML-% IV SOLN
INTRAVENOUS | Status: DC
  Administered 2023-04-30: 6.5 [IU]/h via INTRAVENOUS
  Filled 2023-04-30: qty 100

## 2023-04-30 MED ORDER — RIVAROXABAN 15 MG PO TABS
15.0000 mg | ORAL_TABLET | Freq: Every day | ORAL | Status: DC
  Administered 2023-04-30: 15 mg via ORAL
  Filled 2023-04-30: qty 1

## 2023-04-30 MED ORDER — DOCUSATE SODIUM 100 MG PO CAPS: 100.0000 mg | ORAL_CAPSULE | Freq: Two times a day (BID) | ORAL | Status: DC | PRN

## 2023-04-30 MED ORDER — SODIUM BICARBONATE 8.4 % IV SOLN
100.0000 meq | Freq: Once | INTRAVENOUS | Status: AC
  Administered 2023-04-30: 100 meq via INTRAVENOUS
  Filled 2023-04-30 (×2): qty 50

## 2023-04-30 MED ORDER — CHLORHEXIDINE GLUCONATE CLOTH 2 % EX PADS
6.0000 | MEDICATED_PAD | Freq: Every day | CUTANEOUS | Status: DC
  Administered 2023-05-01: 6 via TOPICAL

## 2023-04-30 MED ORDER — PIPERACILLIN-TAZOBACTAM 3.375 G IVPB: 3.3750 g | Freq: Three times a day (TID) | INTRAVENOUS | Status: DC

## 2023-04-30 MED ORDER — POTASSIUM PHOSPHATES 15 MMOLE/5ML IV SOLN
30.0000 mmol | Freq: Once | INTRAVENOUS | Status: AC
  Administered 2023-04-30: 30 mmol via INTRAVENOUS
  Filled 2023-04-30: qty 10

## 2023-04-30 MED ORDER — STERILE WATER FOR INJECTION IV SOLN
INTRAVENOUS | Status: DC
  Filled 2023-04-30: qty 1000

## 2023-04-30 MED ORDER — DEXTROSE 50 % IV SOLN: 0.0000 mL | INTRAVENOUS | Status: DC | PRN

## 2023-04-30 MED ORDER — GLUCERNA SHAKE PO LIQD
237.0000 mL | Freq: Two times a day (BID) | ORAL | Status: DC
  Administered 2023-04-30 – 2023-05-02 (×4): 237 mL via ORAL

## 2023-04-30 MED ORDER — POLYETHYLENE GLYCOL 3350 17 G PO PACK: 17.0000 g | PACK | Freq: Every day | ORAL | Status: DC | PRN

## 2023-04-30 MED ORDER — ORAL CARE MOUTH RINSE: 15.0000 mL | OROMUCOSAL | Status: DC | PRN

## 2023-04-30 MED ORDER — RIVAROXABAN 20 MG PO TABS
20.0000 mg | ORAL_TABLET | Freq: Every day | ORAL | Status: DC
  Filled 2023-04-30: qty 1

## 2023-04-30 MED ORDER — TRAZODONE HCL 50 MG PO TABS
50.0000 mg | ORAL_TABLET | Freq: Every day | ORAL | Status: DC
  Administered 2023-04-30 – 2023-05-01 (×2): 50 mg via ORAL
  Filled 2023-04-30 (×2): qty 1

## 2023-04-30 MED ORDER — SODIUM BICARBONATE 8.4 % IV SOLN
25.0000 meq | INTRAVENOUS | Status: AC
  Administered 2023-04-30: 25 meq via INTRAVENOUS

## 2023-04-30 MED ORDER — RIVAROXABAN 15 MG PO TABS: 15.0000 mg | ORAL_TABLET | Freq: Two times a day (BID) | ORAL | Status: DC

## 2023-04-30 MED ORDER — SODIUM CHLORIDE 0.9 % IV BOLUS: 1000.0000 mL | Freq: Once | INTRAVENOUS | Status: DC

## 2023-04-30 MED ORDER — RIVAROXABAN 20 MG PO TABS: 20.0000 mg | ORAL_TABLET | Freq: Every day | ORAL | Status: DC

## 2023-04-30 NOTE — ED Provider Notes (Signed)
[facility] Provider Note   CSN: [id number] Arrival date & time: 04/30/23  1008     History  Chief Complaint  Patient presents with   Hyperglycemia    Tabius Torain is a 52 y.o. male.   Hyperglycemia   52 year old male presents emergency department with complaints of elevated blood glucose.  Patient insulin-dependent diabetic and states he has not had his home insulin for at least the past 2 days but difficulty to get detailed history from patient.  Patient also complaining of "pain all over my body."  Patient unable to respond to pertinent positives/negatives on HPI.  Mother is with patient who also is unaware of the last time that patient administered insulin for the last time patient took any of his at home medications.  States that she noted acute worsening today prompting visit to the emergency department.  She states that is similar to times in the past has had to be admitted for DKA.  Past medical history significant for diabetes mellitus type 1, DKA, asthma, alcohol abuse, major depressive disorder, heart failure with reduced ejection fraction, tardive dyskinesia  Home Medications Prior to Admission medications   Medication Sig Start Date End Date Taking? Authorizing Provider  insulin aspart (NOVOLOG) 100 UNIT/ML FlexPen For glucose 121 to 150 use 1 unit, for 151 to 200 use 2 units, for 201 to 250 use 3 units, for 251 to 300 use 5 units, for 301 to 350 use 7 units for 351 or greater use 9 units. Patient taking differently: Inject 1-18 Units into the skin in the morning, at noon, in the evening, and at bedtime. 10/07/20  Yes [name], [name], MD  insulin detemir (LEVEMIR) 100 UNIT/ML FlexPen Inject 8 Units into the skin at bedtime. 03/23/23  Yes Rai, Ripudeep K, MD  traZODone (DESYREL) 50 MG tablet Take 1 tablet (50 mg total) by mouth at bedtime. 03/24/23  Yes Russella Dar, NP  VENTOLIN HFA 108 (90 Base) MCG/ACT inhaler  Inhale 1 puff into the lungs every 4 (four) hours as needed for wheezing. 04/02/23  Yes [provider]  ARIPiprazole (ABILIFY) 10 MG tablet Take 1 tablet (10 mg total) by mouth daily. Patient not taking: Reported on 03/25/2023 03/23/23   Cathren Harsh, MD  aspirin 81 MG chewable tablet Chew 1 tablet (81 mg total) by mouth daily. Patient not taking: Reported on 03/25/2023 03/23/23   Rai, Delene Ruffini, MD  feeding supplement, GLUCERNA SHAKE, (GLUCERNA SHAKE) LIQD Take 237 mLs by mouth 2 (two) times daily between meals. 01/15/23   [name], MD  folic acid (FOLVITE) 1 MG tablet Take 1 tablet (1 mg total) by mouth daily. Patient not taking: Reported on 03/25/2023 03/23/23   Rai, Delene Ruffini, MD  metoCLOPramide (REGLAN) 5 MG tablet Take 1 tablet (5 mg total) by mouth every 8 (eight) hours as needed for nausea or vomiting. Patient not taking: Reported on 03/25/2023 03/23/23   Cathren Harsh, MD  Multiple Vitamin (MULTIVITAMIN WITH MINERALS) TABS tablet Take 1 tablet by mouth daily. Patient not taking: Reported on 03/25/2023 03/23/23   Rai, Delene Ruffini, MD  pantoprazole (PROTONIX) 40 MG tablet Take 1 tablet (40 mg total) by mouth daily. Patient not taking: Reported on 03/25/2023 03/23/23   Cathren Harsh, MD  rivaroxaban (XARELTO) 20 MG TABS tablet Take 1 tablet (20 mg total) by mouth daily with supper. Take this when the xarelto starter pack is completed. 04/12/23   Rai, Ripudeep  K, MD  RIVAROXABAN (XARELTO) VTE STARTER PACK (15 & 20 MG) Follow package directions: Take one 15mg  tablet by mouth twice a day. On day 22 (04/12/2023), switch to one 20mg  tablet once a day. Take with food. Patient not taking: Reported on 03/25/2023 03/23/23   Cathren Harsh, MD      Allergies    Nitrous oxide    Review of Systems   Review of Systems  All other systems reviewed and are negative.   Physical Exam Updated Vital Signs BP 118/87 (BP Location: Left Arm)   Pulse (!) 115   Temp (!) 97.4 F (36.3 C)  (Axillary)   Resp (!) 21   Ht 5\' 7"  (1.702 m)   SpO2 98%   BMI 15.66 kg/m  Physical Exam Vitals and nursing note reviewed.  Constitutional:      Appearance: He is well-developed. He is ill-appearing and toxic-appearing.     Comments: Patient with appreciable tachypnea with short shallow breathing consistent with kussmal breaths.  Patient often dozes off when asking questions.  Patient with observable slurring of speech.  Patient generally cachectic and ill-appearing  HENT:     Head: Normocephalic and atraumatic.  Eyes:     Conjunctiva/sclera: Conjunctivae normal.  Cardiovascular:     Rate and Rhythm: Regular rhythm. Tachycardia present.  Pulmonary:     Effort: Pulmonary effort is normal.     Comments: Mild wheeze auscultated bilaterally Abdominal:     Palpations: Abdomen is soft.     Tenderness: There is no abdominal tenderness. There is no guarding.  Musculoskeletal:        General: No swelling.     Cervical back: Neck supple.     Right lower leg: No edema.     Left lower leg: No edema.  Skin:    General: Skin is warm and dry.     Capillary Refill: Capillary refill takes less than 2 seconds.  Psychiatric:        Mood and Affect: Mood normal.     ED Results / Procedures / Treatments   Labs (all labs ordered are listed, but only abnormal results are displayed) Labs Reviewed  CBC - Abnormal; Notable for the following components:      Result Value   WBC 11.5 (*)    RBC 3.83 (*)    Hemoglobin 12.9 (*)    MCV 107.8 (*)    All other components within normal limits  URINALYSIS, ROUTINE W REFLEX MICROSCOPIC - Abnormal; Notable for the following components:   Color, Urine STRAW (*)    Glucose, UA >=500 (*)    Hgb urine dipstick TRACE (*)    Ketones, ur >=80 (*)    All other components within normal limits  COMPREHENSIVE METABOLIC PANEL - Abnormal; Notable for the following components:   Sodium 123 (*)    Potassium 5.5 (*)    Chloride 80 (*)    CO2 <7 (*)    Glucose,  Bld >1,200 (*)    BUN 40 (*)    Creatinine, Ser 1.78 (*)    AST 144 (*)    ALT 190 (*)    Alkaline Phosphatase 226 (*)    Total Bilirubin 1.4 (*)    GFR, Estimated 46 (*)    All other components within normal limits  BETA-HYDROXYBUTYRIC ACID - Abnormal; Notable for the following components:   Beta-Hydroxybutyric Acid >8.00 (*)    All other components within normal limits  LACTIC ACID, PLASMA - Abnormal; Notable for the following  components:   Lactic Acid, Venous 5.9 (*)    All other components within normal limits  LACTIC ACID, PLASMA - Abnormal; Notable for the following components:   Lactic Acid, Venous >9.0 (*)    All other components within normal limits  MAGNESIUM - Abnormal; Notable for the following components:   Magnesium 2.9 (*)    All other components within normal limits  PHOSPHORUS - Abnormal; Notable for the following components:   Phosphorus 11.9 (*)    All other components within normal limits  AMMONIA - Abnormal; Notable for the following components:   Ammonia 36 (*)    All other components within normal limits  URINALYSIS, MICROSCOPIC (REFLEX) - Abnormal; Notable for the following components:   Bacteria, UA RARE (*)    All other components within normal limits  GLUCOSE, CAPILLARY - Abnormal; Notable for the following components:   Glucose-Capillary 597 (*)    All other components within normal limits  BASIC METABOLIC PANEL - Abnormal; Notable for the following components:   Sodium 134 (*)    Chloride 92 (*)    CO2 16 (*)    Glucose, Bld 512 (*)    BUN 28 (*)    Creatinine, Ser 1.62 (*)    GFR, Estimated 52 (*)    Anion gap 26 (*)    All other components within normal limits  PHOSPHORUS - Abnormal; Notable for the following components:   Phosphorus 2.3 (*)    All other components within normal limits  BETA-HYDROXYBUTYRIC ACID - Abnormal; Notable for the following components:   Beta-Hydroxybutyric Acid 4.45 (*)    All other components within normal  limits  GLUCOSE, CAPILLARY - Abnormal; Notable for the following components:   Glucose-Capillary 599 (*)    All other components within normal limits  GLUCOSE, CAPILLARY - Abnormal; Notable for the following components:   Glucose-Capillary 360 (*)    All other components within normal limits  CBG MONITORING, ED - Abnormal; Notable for the following components:   Glucose-Capillary >600 (*)    All other components within normal limits  CBG MONITORING, ED - Abnormal; Notable for the following components:   Glucose-Capillary >600 (*)    All other components within normal limits  I-STAT VENOUS BLOOD GAS, ED - Abnormal; Notable for the following components:   pH, Ven 6.980 (*)    pCO2, Ven 29.7 (*)    Bicarbonate 7.1 (*)    TCO2 8 (*)    Acid-base deficit 24.0 (*)    Sodium 120 (*)    Potassium 5.5 (*)    All other components within normal limits  CBG MONITORING, ED - Abnormal; Notable for the following components:   Glucose-Capillary >600 (*)    All other components within normal limits  CULTURE, BLOOD (ROUTINE X 2)  CULTURE, BLOOD (ROUTINE X 2)  URINE CULTURE  MRSA NEXT GEN BY PCR, NASAL  ETHANOL  MAGNESIUM  HEPATITIS PANEL, ACUTE  BASIC METABOLIC PANEL  BASIC METABOLIC PANEL  BASIC METABOLIC PANEL  BETA-HYDROXYBUTYRIC ACID  BLOOD GAS, VENOUS  LACTIC ACID, PLASMA  LACTIC ACID, PLASMA  BASIC METABOLIC PANEL  CBC  PHOSPHORUS  MAGNESIUM  CBG MONITORING, ED    EKG None  Radiology CT Head Wo Contrast  Result Date: 04/30/2023 CLINICAL DATA:  52 year old male with altered mental status, has not taking insulin for 2 days, suspected DKA. EXAM: CT HEAD WITHOUT CONTRAST TECHNIQUE: Contiguous axial images were obtained from the base of the skull through the vertex without intravenous contrast. RADIATION  DOSE REDUCTION: This exam was performed according to the departmental dose-optimization program which includes automated exposure control, adjustment of the mA and/or kV according  to patient size and/or use of iterative reconstruction technique. COMPARISON:  Head CT 03/25/2023. FINDINGS: Brain: Small chronic right caudate lacunar infarct appears unchanged. Normal background brain volume. No midline shift, ventriculomegaly, mass effect, evidence of mass lesion, intracranial hemorrhage or evidence of cortically based acute infarction. Elsewhere stable, normal gray-white differentiation. Vascular: No suspicious intracranial vascular hyperdensity. Skull: No acute osseous abnormality identified. Sinuses/Orbits: Visualized paranasal sinuses and mastoids are clear. Other: Visualized orbits and scalp soft tissues are within normal limits. IMPRESSION: 1. No acute intracranial abnormality. 2. Small chronic right caudate lacunar infarct. Electronically Signed   By: Odessa Fleming M.D.   On: 04/30/2023 11:32   DG Chest Portable 1 View  Result Date: 04/30/2023 CLINICAL DATA:  Shortness of breath. EXAM: PORTABLE CHEST 1 VIEW COMPARISON:  X-ray 03/19/2023.  CT angiogram 03/21/2023 FINDINGS: Healing left-sided rib fracture once again identified. No consolidation, edema or effusion. Normal cardiopericardial silhouette. Overlapping cardiac leads. Along both sides there is a change in density peripherally in the lungs. This being related to overlapping structures rather than small pneumothorax. IMPRESSION: Overlapping asymmetric density bilaterally. Legrand Rams this being overlapping structure rather than subtle pneumothorax. Recommend short follow-up or cross-sectional imaging as clinically appropriate for concern. Stable left-sided rib fracture. Electronically Signed   By: Karen Kays M.D.   On: 04/30/2023 11:14    Procedures .Critical Care  Performed by: Peter Garter, PA Authorized by: Peter Garter, PA   Critical care provider statement:    Critical care time (minutes):  31   Critical care was necessary to treat or prevent imminent or life-threatening deterioration of the following conditions:   Endocrine crisis and CNS failure or compromise   Critical care was time spent personally by me on the following activities:  Development of treatment plan with patient or surrogate, discussions with consultants, evaluation of patient's response to treatment, examination of patient, ordering and review of laboratory studies, ordering and review of radiographic studies, ordering and performing treatments and interventions, pulse oximetry, re-evaluation of patient's condition and review of old charts   I assumed direction of critical care for this patient from another provider in my specialty: no     Care discussed with: admitting provider       Medications Ordered in ED Medications  docusate sodium (COLACE) capsule 100 mg (has no administration in time range)  polyethylene glycol (MIRALAX / GLYCOLAX) packet 17 g (has no administration in time range)  pantoprazole (PROTONIX) EC tablet 40 mg (40 mg Oral Given 04/30/23 1556)  insulin regular, human (MYXREDLIN) 100 units/ 100 mL infusion (5 Units/hr Intravenous Rate/Dose Change 04/30/23 1627)  lactated ringers infusion ( Intravenous New Bag/Given 04/30/23 1501)  dextrose 5 % in lactated ringers infusion (has no administration in time range)  dextrose 50 % solution 0-50 mL (has no administration in time range)  ARIPiprazole (ABILIFY) tablet 10 mg (10 mg Oral Given 04/30/23 1556)  feeding supplement (GLUCERNA SHAKE) (GLUCERNA SHAKE) liquid 237 mL (237 mLs Oral Given 04/30/23 1558)  traZODone (DESYREL) tablet 50 mg (has no administration in time range)  albuterol (PROVENTIL) (2.5 MG/3ML) 0.083% nebulizer solution 2.5 mg (has no administration in time range)  Rivaroxaban (XARELTO) tablet 15 mg (15 mg Oral Given 04/30/23 1722)  thiamine (VITAMIN B1) injection 100 mg (100 mg Intravenous Given 04/30/23 1556)  Oral care mouth rinse (has no administration in time range)  lactated ringers bolus 1,000 mL (0 mLs Intravenous Stopped 04/30/23 1255)  sodium bicarbonate  injection 100 mEq (100 mEq Intravenous Given 04/30/23 1222)  piperacillin-tazobactam (ZOSYN) IVPB 3.375 g (0 g Intravenous Stopped 04/30/23 1323)  lactated ringers bolus 1,000 mL (1,000 mLs Intravenous New Bag/Given 04/30/23 1301)  sodium bicarbonate injection 25 mEq (25 mEq Intravenous Given 04/30/23 1328)  lactated ringers bolus 500 mL (0 mLs Intravenous Stopped 04/30/23 1721)    ED Course/ Medical Decision Making/ A&P Clinical Course as of 04/30/23 1723  Fri Apr 30, 2023  1100 pH, Ven(!!): 6.980 [HN]  1101 pCO2, Ven(!): 29.7 [HN]  1101 TCO2(!): 8 [HN]  1153 Glucose(!!): >1,200 [HN]  1154 Potassium(!): 5.5 [HN]  1154 Creatinine(!): 1.78 [HN]  1155 Consulted critical care Dr. Tonia Brooms who agreed to admission and assume further treatment/care. [CR]    Clinical Course User Index [CR] Peter Garter, PA [HN] Loetta Rough, MD                             Medical Decision Making Amount and/or Complexity of Data Reviewed Labs: ordered. Decision-making details documented in ED Course. Radiology: ordered.  Risk Prescription drug management. Decision regarding hospitalization.   This patient presents to the ED for concern of blood sugar abnormalities, this involves an extensive number of treatment options, and is a complaint that carries with it a high risk of complications and morbidity.  The differential diagnosis includes DKA, HHS, hyperglycemia, sepsis,   Co morbidities that complicate the patient evaluation  See HPI   Additional history obtained:  Additional history obtained from EMR External records from outside source obtained and reviewed including hospital records   Lab Tests:  I Ordered, and personally interpreted labs.  The pertinent results include: Leukocytosis of 11.5.  Evidence of anemia with hemoglobin 12.9 which is near patient's baseline.  Platelets within range.  Multiple electrolyte abnormalities including hyponatremia of 123 of which corrected with  patient's glucose of 142, hyperkalemia 5.5, hyperchloremia and decreased bicarb of 80 and less than 7 respectively.  Patient glucose significantly elevated of greater than 1200.  Patient with transaminitis of 144 and 190 of which is improved from prior hospital visit as well as alkaline phosphatase of 226 and total bilirubin 1.4.  Patient with evidence of AKI with GFR 46, creatinine 1.78 as well as BUN of 40.  Beta hydroxybutyric acid greater than 8.  VBG showed signs of significant acidosis with pH of 6.9 as well as pCO2 of 29.7.  UA significant for greater than 500 glucose, greater than 80 ketones and trace hemoglobin with bacteria.  Ammonia slightly elevated at 36.  Lactic acid of greater than 9   Imaging Studies ordered:  I ordered imaging studies including chest x-ray, CT head I independently visualized and interpreted imaging which showed  Chest x-ray: No acute cardiopulmonary abnormality CT head: No acute intracranial abnormality.  Small right chronic right caudate lacunar infarct I agree with the radiologist interpretation   Cardiac Monitoring: / EKG:  The patient was maintained on a cardiac monitor.  I personally viewed and interpreted the cardiac monitored which showed an underlying rhythm of: Sinus tachycardia   Consultations Obtained:  See ED course  Problem List / ED Course / Critical interventions / Medication management  DKA, metabolic encephalopathy, AKI I ordered medication including lactated ringer, bicarb, insulin   Reevaluation of the patient after these medicines showed that the patient improved I have reviewed the patients home  medicines and have made adjustments as needed   Social Determinants of Health:  Medical noncompliance, polysubstance use/abuse   Test / Admission - Considered:  DKA, metabolic encephalopathy, AKI Vitals signs significant for patient persistently tachycardic as well as tachypneic with heart rate greater than 110 and respiratory rate  greater than 20. Otherwise within normal range and stable throughout visit. Laboratory/imaging studies significant for: See above 52 year old male presents emergency department with complaints of elevated blood sugar as well as altered mental status.  Patient found with evidence of severe DKA with significant acidosis.  Regarding altered mentation, no evidence of intracranial abnormality appreciated on CT study.  Most likely patient's encephalopathy coming from metabolic dysfunction given current acidosis, electrolyte derangement and possibly attributable to mildly elevated ammonia.  Given the severity of patient's presentation, critical care was consulted who agreed with admission.  Patient's DKA most likely secondary to medical noncompliance in the outpatient setting.  Patient deemed to meet admission criteria.  Treatment plan discussed at length with patient and he acknowledged understanding was agreeable to said plan.  Patient stable upon admission to the hospital        Final Clinical Impression(s) / ED Diagnoses Final diagnoses:  Diabetic ketoacidosis without coma associated with type 1 diabetes mellitus (HCC)  Encephalopathy  AKI (acute kidney injury) (HCC)  Transaminitis    Rx / DC Orders ED Discharge Orders     None         Peter Garter, [place] 04/30/23 1723    Loetta Rough, MD 05/10/23 1624

## 2023-04-30 NOTE — ED Notes (Signed)
Pt states he has missed "a few doses of insulin over the last few days" pt is oriented x4 but speech is hard to understand. Pt is lethargic and shaking.

## 2023-04-30 NOTE — H&P (Signed)
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  Patient presented to the ED at [facility] today after not taking his insulin 2 days ago and feeling generally unwell.  Vital sings on presentation: Temp 96.1, RR 26, HR 123, BP: 169/119 with SpO2 100% on room air. Labs were obtained and consistent with DKA: BG: > 1200, ph 6.9, pco2: 29, HCO3: 7.1, urinalysis + ketones.  He received 1 L LR, started on IVF resuscitation with LR and bicarb, and insulin infusion.  Received 1 time dose of Zosyn.  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 rate (Abnormal) 26, height 5\' 7"  (1.702 m), SpO2 100 %.       No intake or output data in the 24 hours ending 04/30/23 1147 There were no vitals filed for this visit.  Examination: General: cachectic adult male, chronically ill appearing HENT: [place]/at Lungs: symmetric lung expansion, mild expiratory wheezing on the left Cardiovascular: SR on telemetry, no edema in extremities, warm and well  perfused Abdomen: soft non tender, non distended Extremities: no deformities Neuro: Alert and oriented, no focal deficits  Resolved Hospital Problem list   N/a  Assessment & Plan:  DKA in setting of Type I DM - history of medication noncompliance and hospital admissions for DKA - Etiology: Medication noncompliance, patient has not had insulin in > 2 days. He states he didn't do it on purpose, just misses doses sometimes. Does have a slightly elevated WBC and infectious work up initiated - Infectious work up: CXR without focal consolidation, on room air, UA without evidence of infection, blood cultures in process. - Treatment: s/p 1 L IVF (87ml/kg), will give additional IVF resuscitation with mIVF.  DKA order set with insulin infusion, q 4 hour BMPs. Transition to dextrose containing IVF once BG < 250  - Unless confirmed infectious process, or fever, would hold antibiotics   Hyponatremia in setting of hyperglycemia - Sodium on presentation 123 with BG > 1200.  Corrected sodium: 142  Elevated lactate - Secondary to DKA/dehydration - Trending  Acute kidney injury Mildly elevated potassium  Elevated phos - Etiology: Above - Baseline creatinine: 0.7-0.8 - Creatinine on presentation 1.78 - K 5.5, no treatment. Will likely come down while on insulin drip for DKA - Phos level elevated at 11.9, will repeat. If remains elevated will add phoslo when tolerating PO.  - Continue IVF resuscitation  Acute toxic metabolic encephalopathy, improving - Secondary to above - Ammonia 36 - CTH without acute intracranial process, non focal on exam  - poor nutrition, add thiamine supplementation  Elevated LFTs - Noted  to be elevated last month as well - Hepatitis panel ordered  - ETOH Consumption: patient states he drank 2 40's per day up until his last admission and has not had any ETOH since that time. 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 No LV apical thrombus, RV function is mildly reduced with normal size - No acute interventions  Schizophrenia  Major depressive disorder - Home meds: Abilify, trazodone - Continue home meds  Moderate cannabis use   Best Practice (right click and "Reselect all SmartList Selections" daily)   Diet/type: Regular consistency (see orders) DVT prophylaxis: systemic heparin GI prophylaxis: PPI  Home med  Lines: N/A Foley:  N/A Code Status:  full code Last date of multidisciplinary goals of care discussion: n/a  Labs   CBC: Recent Labs  Lab 04/30/23 1039 04/30/23 1045  WBC  --  11.5*  HGB 15.6 12.9*  HCT 46.0 41.3  MCV  --  107.8*  PLT  --  372    Basic Metabolic Panel: Recent Labs  Lab 04/30/23 1039 04/30/23 1130  NA 120*  --   K 5.5*  --   MG  --  2.9*  PHOS  --  11.9*   GFR: CrCl cannot be calculated (Patient's most recent lab result is older than the maximum 21 days allowed.). Recent Labs  Lab 04/30/23 1045  WBC 11.5*    Liver Function Tests: No results for input(s): "AST", "ALT", "ALKPHOS", "BILITOT", "PROT", "ALBUMIN" in the last 168 hours. No results for input(s): "LIPASE", "AMYLASE" in the last 168 hours. No results for input(s): "AMMONIA" in the last 168 hours.  ABG    Component Value Date/Time   PHART 7.485 (H) 10/04/2020 0210   PCO2ART 25.5 (L) 10/04/2020 0210   PO2ART 152 (H) 10/04/2020 0210   HCO3 7.1 (L) 04/30/2023 1039   TCO2 8 (L) 04/30/2023 1039   ACIDBASEDEF 24.0 (H) 04/30/2023 1039   O2SAT 56 04/30/2023 1039     Coagulation Profile: No results for input(s): "INR", "PROTIME" in the last 168 hours.  Cardiac Enzymes: No results for input(s): "CKTOTAL",  "CKMB", "CKMBINDEX", "TROPONINI" in the last 168 hours.  HbA1C: Hgb A1c MFr Bld  Date/Time Value Ref Range Status  03/22/2023 02:05 AM 14.2 (H) 4.8 - 5.6 % Final    Comment:    (NOTE) Pre diabetes:          5.7%-6.4%  Diabetes:              >6.4%  Glycemic control for   <7.0% adults with diabetes   01/12/2023 04:11 PM 11.8 (H) 4.8 - 5.6 % Final    Comment:    (NOTE) Pre diabetes:          5.7%-6.4%  Diabetes:              >6.4%  Glycemic control for   <7.0% adults with diabetes     CBG: Recent Labs  Lab 04/30/23 1023  GLUCAP >600*    Review of Systems:   Please see HPI   Past Medical History:  He,  has a past medical history of Asthma, Depression, Diabetes mellitus without complication (HCC), Schizophrenia (HCC), and Uses self-applied continuous glucose  monitoring device.   Surgical History:   Past Surgical History:  Procedure Laterality Date   HERNIA REPAIR       Social History:   reports that he has been smoking cigarettes. He has been smoking an average of 2.50 packs per day. He has never used smokeless tobacco. He reports current alcohol use. He reports current drug use. Drug: Marijuana.   Family History:  His family history includes Diabetes in his maternal grandfather.   Allergies Allergies  Allergen Reactions   Nitrous Oxide Nausea And Vomiting     Home Medications  Prior to Admission medications   Medication Sig Start Date End Date Taking? Authorizing Provider  ARIPiprazole (ABILIFY) 10 MG tablet Take 1 tablet (10 mg total) by mouth daily. Patient not taking: Reported on 03/25/2023 03/23/23   Cathren Harsh, MD  aspirin 81 MG chewable tablet Chew 1 tablet (81 mg total) by mouth daily. Patient not taking: Reported on 03/25/2023 03/23/23   Rai, Delene Ruffini, MD  feeding supplement, GLUCERNA SHAKE, (GLUCERNA SHAKE) LIQD Take 237 mLs by mouth 2 (two) times daily between meals. 01/15/23   [name], MD  folic acid (FOLVITE) 1 MG tablet Take 1  tablet (1 mg total) by mouth daily. Patient not taking: Reported on 03/25/2023 03/23/23   Rai, Delene Ruffini, MD  insulin aspart (NOVOLOG) 100 UNIT/ML FlexPen For glucose 121 to 150 use 1 unit, for 151 to 200 use 2 units, for 201 to 250 use 3 units, for 251 to 300 use 5 units, for 301 to 350 use 7 units for 351 or greater use 9 units. Patient taking differently: Inject 1-18 Units into the skin in the morning, at noon, in the evening, and at bedtime. 10/07/20   [name], [name], MD  insulin detemir (LEVEMIR) 100 UNIT/ML FlexPen Inject 8 Units into the skin at bedtime. 03/23/23   Rai, Delene Ruffini, MD  metoCLOPramide (REGLAN) 5 MG tablet Take 1 tablet (5 mg total) by mouth every 8 (eight) hours as needed for nausea or vomiting. Patient not taking: Reported on 03/25/2023 03/23/23   Cathren Harsh, MD  Multiple Vitamin (MULTIVITAMIN WITH MINERALS) TABS tablet Take 1 tablet by mouth daily. Patient not taking: Reported on 03/25/2023 03/23/23   Rai, Delene Ruffini, MD  pantoprazole (PROTONIX) 40 MG tablet Take 1 tablet (40 mg total) by mouth daily. Patient not taking: Reported on 03/25/2023 03/23/23   Cathren Harsh, MD  rivaroxaban (XARELTO) 20 MG TABS tablet Take 1 tablet (20 mg total) by mouth daily with supper. Take this when the xarelto starter pack is completed. Patient not taking: Reported on 03/25/2023 04/12/23   Cathren Harsh, MD  RIVAROXABAN Carlena Hurl) VTE STARTER PACK (15 & 20 MG) Follow package directions: Take one 15mg  tablet by mouth twice a day. On day 22 (04/12/2023), switch to one 20mg  tablet once a day. Take with food. Patient not taking: Reported on 03/25/2023 03/23/23   Rai, Delene Ruffini, MD  traZODone (DESYREL) 50 MG tablet Take 1 tablet (50 mg total) by mouth at bedtime. Patient not taking: Reported on 03/25/2023 03/24/23   Russella Dar, NP   CCT: 55 min

## 2023-04-30 NOTE — Progress Notes (Signed)
Pharmacy Antibiotic Note  Tom Anderson is a 52 y.o. male admitted on 04/30/2023 with DKA and possible sepsis.  Pharmacy has been consulted for Zosyn dosing.  WBC 11.5, Temp 96.1 HR 123, RR 26 SCr 1.78  Plan: Start Zosyn 3.375g IV x1 over 30 minutes, then in 6 hours start Zosyn 3.375g IV q8h extended-infusion Monitor daily CBC, temp, SCr, and for clinical signs of improvement  F/u cultures and de-escalate antibiotics as able   Height: 5\' 7"  (170.2 cm) IBW/kg (Calculated) : 66.1  Temp (24hrs), Avg:96.1 F (35.6 C), Min:96.1 F (35.6 C), Max:96.1 F (35.6 C)  Recent Labs  Lab 04/30/23 1045  WBC 11.5*  CREATININE 1.78*    CrCl cannot be calculated (Unknown ideal weight.).    Allergies  Allergen Reactions   Nitrous Oxide Nausea And Vomiting    Antimicrobials this admission: Zosyn 5/31 >>   Dose adjustments this admission: N/A  Microbiology results: 5/31 BCx: sent 5/31 UCx: sent    Thank you for allowing pharmacy to be a part of this patient's care.  Wilburn Cornelia, PharmD, BCPS Clinical Pharmacist 04/30/2023 12:21 PM   Please refer to AMION for pharmacy phone number

## 2023-04-30 NOTE — Progress Notes (Signed)
ICU pickup for tomorrow morning, patient came with severe DKA pH 6.9 still on insulin drip, expect patient will come out of insulin drip sometime tonight.

## 2023-04-30 NOTE — ED Triage Notes (Signed)
Pt has not taken his insulin for 2 days.  Started to feel bad today.  Pt has kussmaul respirations, smells of ketones and tachycardic.  Pt states he has insulin at home.    Pt is thin and appears disheveled.

## 2023-04-30 NOTE — ED Notes (Signed)
CBG obtained in triage. Monitor reading HI.

## 2023-04-30 NOTE — Progress Notes (Signed)
eLink Physician-Brief Progress Note Patient Name: Tom Anderson DOB: 02-19-1971 MRN: [id number]   Date of Service  04/30/2023  HPI/Events of Note  LA at 6, improving  eICU Interventions  Continue DKA protocol     Intervention Category Intermediate Interventions: Other:  Ranee Gosselin 04/30/2023, 9:34 PM

## 2023-05-01 DIAGNOSIS — E1010 Type 1 diabetes mellitus with ketoacidosis without coma: Secondary | ICD-10-CM | POA: Diagnosis not present

## 2023-05-01 LAB — BASIC METABOLIC PANEL
Anion gap: 12 (ref 5–15)
BUN: 17 mg/dL (ref 6–20)
CO2: 26 mmol/L (ref 22–32)
Calcium: 8.7 mg/dL — ABNORMAL LOW (ref 8.9–10.3)
Chloride: 96 mmol/L — ABNORMAL LOW (ref 98–111)
Creatinine, Ser: 0.92 mg/dL (ref 0.61–1.24)
GFR, Estimated: >60 mL/min (ref >60)
Glucose, Bld: 180 mg/dL — ABNORMAL HIGH (ref 70–99)
Potassium: 3.9 mmol/L (ref 3.5–5.1)
Sodium: 134 mmol/L — ABNORMAL LOW (ref 135–145)

## 2023-05-01 LAB — COMPREHENSIVE METABOLIC PANEL
ALT: 105 U/L — ABNORMAL HIGH (ref 0–44)
AST: 57 U/L — ABNORMAL HIGH (ref 15–41)
Albumin: 3 g/dL — ABNORMAL LOW (ref 3.5–5.0)
Alkaline Phosphatase: 130 U/L — ABNORMAL HIGH (ref 38–126)
Anion gap: 9 (ref 5–15)
BUN: 14 mg/dL (ref 6–20)
CO2: 28 mmol/L (ref 22–32)
Calcium: 8.4 mg/dL — ABNORMAL LOW (ref 8.9–10.3)
Chloride: 95 mmol/L — ABNORMAL LOW (ref 98–111)
Creatinine, Ser: 0.9 mg/dL (ref 0.61–1.24)
GFR, Estimated: >60 mL/min (ref >60)
Glucose, Bld: 179 mg/dL — ABNORMAL HIGH (ref 70–99)
Potassium: 4 mmol/L (ref 3.5–5.1)
Sodium: 132 mmol/L — ABNORMAL LOW (ref 135–145)
Total Bilirubin: 0.3 mg/dL (ref 0.3–1.2)
Total Protein: 5.1 g/dL — ABNORMAL LOW (ref 6.5–8.1)

## 2023-05-01 LAB — GLUCOSE, CAPILLARY
Glucose-Capillary: 127 mg/dL — ABNORMAL HIGH (ref 70–99)
Glucose-Capillary: 146 mg/dL — ABNORMAL HIGH (ref 70–99)
Glucose-Capillary: 152 mg/dL — ABNORMAL HIGH (ref 70–99)
Glucose-Capillary: 155 mg/dL — ABNORMAL HIGH (ref 70–99)
Glucose-Capillary: 170 mg/dL — ABNORMAL HIGH (ref 70–99)
Glucose-Capillary: 174 mg/dL — ABNORMAL HIGH (ref 70–99)
Glucose-Capillary: 174 mg/dL — ABNORMAL HIGH (ref 70–99)
Glucose-Capillary: 178 mg/dL — ABNORMAL HIGH (ref 70–99)
Glucose-Capillary: 184 mg/dL — ABNORMAL HIGH (ref 70–99)
Glucose-Capillary: 205 mg/dL — ABNORMAL HIGH (ref 70–99)
Glucose-Capillary: 217 mg/dL — ABNORMAL HIGH (ref 70–99)
Glucose-Capillary: 223 mg/dL — ABNORMAL HIGH (ref 70–99)
Glucose-Capillary: 288 mg/dL — ABNORMAL HIGH (ref 70–99)
Glucose-Capillary: 80 mg/dL (ref 70–99)
Glucose-Capillary: 82 mg/dL (ref 70–99)
Glucose-Capillary: 89 mg/dL (ref 70–99)

## 2023-05-01 LAB — URINE CULTURE: Culture: NO GROWTH

## 2023-05-01 LAB — CULTURE, BLOOD (ROUTINE X 2)
Culture: NO GROWTH
Special Requests: ADEQUATE

## 2023-05-01 LAB — CBC
HCT: 30.1 % — ABNORMAL LOW (ref 39.0–52.0)
Hemoglobin: 10.2 g/dL — ABNORMAL LOW (ref 13.0–17.0)
MCH: 32.9 pg (ref 26.0–34.0)
MCHC: 33.9 g/dL (ref 30.0–36.0)
MCV: 97.1 fL (ref 80.0–100.0)
Platelets: 232 10*3/uL (ref 150–400)
RBC: 3.1 MIL/uL — ABNORMAL LOW (ref 4.22–5.81)
RDW: 11.9 % (ref 11.5–15.5)
WBC: 13.2 10*3/uL — ABNORMAL HIGH (ref 4.0–10.5)
nRBC: 0 % (ref 0.0–0.2)

## 2023-05-01 LAB — PHOSPHORUS: Phosphorus: 2 mg/dL — ABNORMAL LOW (ref 2.5–4.6)

## 2023-05-01 LAB — MAGNESIUM: Magnesium: 1.9 mg/dL (ref 1.7–2.4)

## 2023-05-01 LAB — LACTIC ACID, PLASMA: Lactic Acid, Venous: 5.1 mmol/L (ref 0.5–1.9)

## 2023-05-01 LAB — BETA-HYDROXYBUTYRIC ACID: Beta-Hydroxybutyric Acid: 0.19 mmol/L (ref 0.05–0.27)

## 2023-05-01 MED ORDER — NICOTINE 21 MG/24HR TD PT24
21.0000 mg | MEDICATED_PATCH | Freq: Every day | TRANSDERMAL | Status: DC
  Administered 2023-05-01: 21 mg via TRANSDERMAL
  Filled 2023-05-01: qty 1

## 2023-05-01 MED ORDER — INSULIN GLARGINE-YFGN 100 UNIT/ML ~~LOC~~ SOLN
8.0000 [IU] | Freq: Every day | SUBCUTANEOUS | Status: DC
  Administered 2023-05-01 – 2023-05-02 (×2): 8 [IU] via SUBCUTANEOUS
  Filled 2023-05-01 (×2): qty 0.08

## 2023-05-01 MED ORDER — INSULIN ASPART 100 UNIT/ML IJ SOLN
0.0000 [IU] | Freq: Three times a day (TID) | INTRAMUSCULAR | Status: DC
  Administered 2023-05-01: 3 [IU] via SUBCUTANEOUS

## 2023-05-01 MED ORDER — K PHOS MONO-SOD PHOS DI & MONO 155-852-130 MG PO TABS
500.0000 mg | ORAL_TABLET | Freq: Three times a day (TID) | ORAL | Status: DC
  Administered 2023-05-01 – 2023-05-02 (×4): 500 mg via ORAL
  Filled 2023-05-01 (×6): qty 2

## 2023-05-01 MED ORDER — INSULIN ASPART 100 UNIT/ML IJ SOLN: 0.0000 [IU] | Freq: Every day | INTRAMUSCULAR | Status: DC

## 2023-05-01 MED ORDER — THIAMINE MONONITRATE 100 MG PO TABS
100.0000 mg | ORAL_TABLET | Freq: Every day | ORAL | Status: DC
  Administered 2023-05-02: 100 mg via ORAL
  Filled 2023-05-01: qty 1

## 2023-05-01 NOTE — Progress Notes (Signed)
PROGRESS NOTE  Tom Anderson  WJX:[id number] DOB: 09-07-1971 DOA: 04/30/2023 PCP: Montez Hageman, DO   Brief Narrative: Patient is a 52 year old male with history of diabetes type 1, recent COVID infection in March, descending thoracic aortic thrombus on Xarelto, chronic systolic heart failure with EF of 45 to 50%, schizophrenia, tobacco use who presented to the emergency department with complaint of not feeling well.  He reported that he did not take insulin for last 2 days.  On presentation he was hypertensive.  Lab work showed blood sugar more than 1200, pH of 6.9, UA showed ketones.  Also found to have sodium of 123, potassium 5.5, creatinine of 1.78, phosphorus of 11.9, elevated liver enzymes, WBC count of 11.5, lactate  of more than 9 patient was admitted for management of diabetic ketoacidosis.  Admitted under PCCM service.  Transferred to [facility] service on 6/1.  Diabetic coordinator consulted.  Assessment & Plan:  Principal Problem:   DKA (diabetic ketoacidosis) (HCC)  DKA/diabetes type 1/severe acidosis: History of noncompliance, multiple admissions for DKA in the past.  Report of not taking insulin for last 2 days.  Started on insulin drip.  Gap is closed.  Will start on long-acting and sliding scale insulin.  Continue to monitor blood sugars.  Diabetic coordinator consulted.  Continue IV fluids for today. A1c as per 4/22 /24 was 14.2.  Patient follows with endocrinology.  Leukocytosis/elevated lactate: This is most likely from hemoconcentration/dehydration.  Low suspicion for infectious etiology.  Antibiotics stopped.  Continue IV fluids  Severe hyponatremia: In the setting of hyperosmolar hyponatremia from severe hyperglycemia.  Sodium level has been stable now.  AKI /hyperkalemia/hyperphosphatemia: Prerenal secondary to severe dehydration.  Kidney function has normalized.  Currently potassium level normal.  Follow-up phosphorus level came to be low.  Elevated liver enzymes: Unclear  etiology.  Found to be elevated last month as well.  Hepatitis panel negative.  On reviewing his previous lab works, he has elevated liver enzymes.  History of alcohol abuse.  Right upper quadrant ultrasound did not show any cholecystitis but showed increased hepatic echogenicity consistent with hepatic steatosis  Acute metabolic encephalopathy: Likely from severe acidosis.  CT head did not show any acute intracranial findings.  This morning he is alert oriented.  History of aortic thrombus: Continue Xarelto  Systolic congestive heart failure: Most recent echocardiogram showed EF of 45 to 50%, no wall motion abnormality, grade 1 diastolic dysfunction.  Dehydrated on presentation and was given IV fluids  Schizophrenia/major depressive disorder: On Abilify, trazodone at home.  Tobacco use/cannabis use: Counseled cessation.  He smokes a pack a day.  Continue nicotine patch    Pressure Injury 04/30/23 Sacrum Mid Stage 1 -  Intact skin with non-blanchable redness of a localized area usually over a bony prominence. (Active)  04/30/23 1445  Location: Sacrum  Location Orientation: Mid  Staging: Stage 1 -  Intact skin with non-blanchable redness of a localized area usually over a bony prominence.  Wound Description (Comments):   Present on Admission: Yes  Dressing Type Foam - Lift dressing to assess site every shift 05/01/23 0400    DVT prophylaxis:SCDs Start: 04/30/23 1452 rivaroxaban (XARELTO) tablet 20 mg     Code Status: Full Code  Family Communication: None at bedside  Patient status:Inpatient  Patient is from :Home  Anticipated discharge OZ:HYQM  Estimated DC date:tomorrow   Consultants: PCCM  Procedures:None  Antimicrobials:  Anti-infectives (From admission, onward)    Start     Dose/Rate Route Frequency Ordered Stop  04/30/23 1800  piperacillin-tazobactam (ZOSYN) IVPB 3.375 g  Status:  Discontinued       See Hyperspace for full Linked Orders Report.   3.375 g 12.5  mL/hr over 240 Minutes Intravenous Every 8 hours 04/30/23 1203 04/30/23 1506   04/30/23 1215  piperacillin-tazobactam (ZOSYN) IVPB 3.375 g       See Hyperspace for full Linked Orders Report.   3.375 g 100 mL/hr over 30 Minutes Intravenous  Once 04/30/23 1203 04/30/23 1323       Subjective: Patient seen and examined at bedside today.  Hemodynamically stable comfortable.  Wants to go home.  Long discussion had at the bedside today regarding the need of staying today due to his abnormal lab works, elevated lactate.  He needs IV fluids.  Discussed about possible discharge home tomorrow  Objective: Vitals:   05/01/23 0400 05/01/23 0500 05/01/23 0600 05/01/23 0719  BP: 105/66 107/76 (!) 91/55   Pulse: 83 94 97   Resp: 14 17 15    Temp:    98.1 F (36.7 C)  TempSrc:    Oral  SpO2: 98% 98% 99%   Weight:      Height:        Intake/Output Summary (Last 24 hours) at 05/01/2023 0743 Last data filed at 05/01/2023 0600 Gross per 24 hour  Intake 5725.34 ml  Output 3300 ml  Net 2425.34 ml   Filed Weights   04/30/23 1445 05/01/23 0225  Weight: 50.7 kg 50.7 kg    Examination:  General exam: Overall comfortable, not in distress,thin built HEENT: PERRL Respiratory system:  no wheezes or crackles  Cardiovascular system: S1 & S2 heard, RRR.  Gastrointestinal system: Abdomen is nondistended, soft and nontender. Central nervous system: Alert and oriented Extremities: No edema, no clubbing ,no cyanosis Skin: No rashes, no ulcers,no icterus     Data Reviewed: I have personally reviewed following labs and imaging studies  CBC: Recent Labs  Lab 04/30/23 1039 04/30/23 1045  WBC  --  11.5*  HGB 15.6 12.9*  HCT 46.0 41.3  MCV  --  107.8*  PLT  --  372   Basic Metabolic Panel: Recent Labs  Lab 04/30/23 1045 04/30/23 1130 04/30/23 1538 04/30/23 1918 04/30/23 2152 05/01/23 0048  NA 123*  --  134* 135 136 134*  K 5.5*  --  3.7 3.8 3.9 3.9  CL 80*  --  92* 94* 97* 96*  CO2 <7*  --   16* 26 25 26   GLUCOSE >1,200*  --  512* 206* 178* 180*  BUN 40*  --  28* 22* 20 17  CREATININE 1.78*  --  1.62* 1.11 0.99 0.92  CALCIUM 9.6  --  10.0 9.7 9.3 8.7*  MG  --  2.9* 2.2  --   --   --   PHOS  --  11.9* 2.3*  --   --   --      Recent Results (from the past 240 hour(s))  MRSA Next Gen by PCR, Nasal     Status: None   Collection Time: 04/30/23  3:27 PM   Specimen: Nasal Mucosa; Nasal Swab  Result Value Ref Range Status   MRSA by PCR Next Gen NOT DETECTED NOT DETECTED Final    Comment: (NOTE) The GeneXpert MRSA Assay (FDA approved for NASAL specimens only), is one component of a comprehensive MRSA colonization surveillance program. It is not intended to diagnose MRSA infection nor to guide or monitor treatment for MRSA infections. Test performance is not FDA  approved in patients less than 22 years old. Performed at [facility], [address] [place], [place] [address]      Radiology Studies: CT Head Wo Contrast  Result Date: 04/30/2023 CLINICAL DATA:  52 year old male with altered mental status, has not taking insulin for 2 days, suspected DKA. EXAM: CT HEAD WITHOUT CONTRAST TECHNIQUE: Contiguous axial images were obtained from the base of the skull through the vertex without intravenous contrast. RADIATION DOSE REDUCTION: This exam was performed according to the departmental dose-optimization program which includes automated exposure control, adjustment of the mA and/or kV according to patient size and/or use of iterative reconstruction technique. COMPARISON:  Head CT 03/25/2023. FINDINGS: Brain: Small chronic right caudate lacunar infarct appears unchanged. Normal background brain volume. No midline shift, ventriculomegaly, mass effect, evidence of mass lesion, intracranial hemorrhage or evidence of cortically based acute infarction. Elsewhere stable, normal gray-white differentiation. Vascular: No suspicious intracranial vascular hyperdensity. Skull: No acute osseous  abnormality identified. Sinuses/Orbits: Visualized paranasal sinuses and mastoids are clear. Other: Visualized orbits and scalp soft tissues are within normal limits. IMPRESSION: 1. No acute intracranial abnormality. 2. Small chronic right caudate lacunar infarct. Electronically Signed   By: Odessa Fleming M.D.   On: 04/30/2023 11:32   DG Chest Portable 1 View  Result Date: 04/30/2023 CLINICAL DATA:  Shortness of breath. EXAM: PORTABLE CHEST 1 VIEW COMPARISON:  X-ray 03/19/2023.  CT angiogram 03/21/2023 FINDINGS: Healing left-sided rib fracture once again identified. No consolidation, edema or effusion. Normal cardiopericardial silhouette. Overlapping cardiac leads. Along both sides there is a change in density peripherally in the lungs. This being related to overlapping structures rather than small pneumothorax. IMPRESSION: Overlapping asymmetric density bilaterally. Legrand Rams this being overlapping structure rather than subtle pneumothorax. Recommend short follow-up or cross-sectional imaging as clinically appropriate for concern. Stable left-sided rib fracture. Electronically Signed   By: Karen Kays M.D.   On: 04/30/2023 11:14    Scheduled Meds:  ARIPiprazole  10 mg Oral Daily   Chlorhexidine Gluconate Cloth  6 each Topical Daily   feeding supplement (GLUCERNA SHAKE)  237 mL Oral BID BM   pantoprazole  40 mg Oral Daily   rivaroxaban  20 mg Oral Q supper   thiamine (VITAMIN B1) injection  100 mg Intravenous Daily   traZODone  50 mg Oral QHS   Continuous Infusions:  dextrose 5% lactated ringers 125 mL/hr at 05/01/23 0600   insulin 2.6 Units/hr (05/01/23 0600)   lactated ringers 125 mL/hr at 04/30/23 1501     LOS: 1 day   Burnadette Pop, MD [facility] P6/11/2022, 7:43 AM

## 2023-05-01 NOTE — Inpatient Diabetes Management (Addendum)
Inpatient Diabetes Program Recommendations  AACE/ADA: New Consensus Statement on Inpatient Glycemic Control (2015)  Target Ranges:  Prepandial:   less than 140 mg/dL      Peak postprandial:   less than 180 mg/dL (1-2 hours)      Critically ill patients:  140 - 180 mg/dL   Lab Results  Component Value Date   GLUCAP 184 (H) 05/01/2023   HGBA1C 14.2 (H) 03/22/2023    Review of Glycemic Control  Latest Reference Range & Units 05/01/23 06:42 05/01/23 07:45 05/01/23 08:52  Glucose-Capillary 70 - 99 mg/dL 244 (H) 010 (H) 272 (H)  (H): Data is abnormally high Diabetes history: Type 1 Dm Outpatient Diabetes medications: Levemir 8 units QHS, Novolog 1-18 units QID Current orders for Inpatient glycemic control: Semglee 8 units QD, novolog 0-15 units TId & HS  Inpatient Diabetes Program Recommendations:    Noted consult. Patient being transitioned off IV insulin at this time. Secure chat sent to RN to ensure appropriate basal insulin timing.   Will plan to reach out to patient when appropriate.   Addendum: Spoke with patient to reinforce importance of taking insulin as prescribed. Last visit was during last admission 03/17/23. Patient frequently misses doses. Patient reports, "I just need to get better at taking injections. I have what I need, I just have to do it". Provided basic information on carb counting, ensure he covers CHO intake and tries to not miss doses. Plans to follow up with Dr Katrinka Blazing, outpatient endocrinology. No further questions at this time.   Thanks, Lujean Rave, MSN, RNC-OB Diabetes Coordinator [phone] (8a-5p)

## 2023-05-02 DIAGNOSIS — E1010 Type 1 diabetes mellitus with ketoacidosis without coma: Secondary | ICD-10-CM | POA: Diagnosis not present

## 2023-05-02 LAB — CBC
HCT: 30.7 % — ABNORMAL LOW (ref 39.0–52.0)
Hemoglobin: 10.4 g/dL — ABNORMAL LOW (ref 13.0–17.0)
MCH: 33.3 pg (ref 26.0–34.0)
MCHC: 33.9 g/dL (ref 30.0–36.0)
MCV: 98.4 fL (ref 80.0–100.0)
Platelets: 177 10*3/uL (ref 150–400)
RBC: 3.12 MIL/uL — ABNORMAL LOW (ref 4.22–5.81)
RDW: 12 % (ref 11.5–15.5)
WBC: 9.3 10*3/uL (ref 4.0–10.5)
nRBC: 0.4 % — ABNORMAL HIGH (ref 0.0–0.2)

## 2023-05-02 LAB — LACTIC ACID, PLASMA: Lactic Acid, Venous: 1.4 mmol/L (ref 0.5–1.9)

## 2023-05-02 LAB — BASIC METABOLIC PANEL
Anion gap: 10 (ref 5–15)
BUN: 11 mg/dL (ref 6–20)
CO2: 26 mmol/L (ref 22–32)
Calcium: 8.2 mg/dL — ABNORMAL LOW (ref 8.9–10.3)
Chloride: 95 mmol/L — ABNORMAL LOW (ref 98–111)
Creatinine, Ser: 0.7 mg/dL (ref 0.61–1.24)
GFR, Estimated: >60 mL/min (ref >60)
Glucose, Bld: 201 mg/dL — ABNORMAL HIGH (ref 70–99)
Potassium: 3.8 mmol/L (ref 3.5–5.1)
Sodium: 131 mmol/L — ABNORMAL LOW (ref 135–145)

## 2023-05-02 LAB — PHOSPHORUS: Phosphorus: 3.7 mg/dL (ref 2.5–4.6)

## 2023-05-02 LAB — GLUCOSE, CAPILLARY
Glucose-Capillary: 581 mg/dL (ref 70–99)
Glucose-Capillary: 559 mg/dL (ref 70–99)
Glucose-Capillary: 528 mg/dL (ref 70–99)
Glucose-Capillary: 325 mg/dL — ABNORMAL HIGH (ref 70–99)
Glucose-Capillary: 233 mg/dL — ABNORMAL HIGH (ref 70–99)

## 2023-05-02 LAB — CULTURE, BLOOD (ROUTINE X 2)

## 2023-05-02 MED ORDER — INSULIN ASPART 100 UNIT/ML IJ SOLN: 5.0000 [IU] | Freq: Once | INTRAMUSCULAR | Status: DC

## 2023-05-02 MED ORDER — NICOTINE 21 MG/24HR TD PT24: 21.0000 mg | MEDICATED_PATCH | Freq: Every day | TRANSDERMAL | 0 refills | Status: DC

## 2023-05-02 MED ORDER — INSULIN ASPART 100 UNIT/ML IJ SOLN
5.0000 [IU] | Freq: Three times a day (TID) | INTRAMUSCULAR | Status: DC
  Administered 2023-05-02: 5 [IU] via SUBCUTANEOUS

## 2023-05-02 MED ORDER — INSULIN ASPART 100 UNIT/ML IJ SOLN
20.0000 [IU] | Freq: Once | INTRAMUSCULAR | Status: AC
  Administered 2023-05-02: 20 [IU] via SUBCUTANEOUS

## 2023-05-02 MED ORDER — INSULIN ASPART 100 UNIT/ML IJ SOLN: 10.0000 [IU] | Freq: Once | INTRAMUSCULAR | Status: DC

## 2023-05-02 NOTE — Discharge Summary (Signed)
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  He reported that he did not take insulin for last 2 days.  On presentation he was hypertensive.  Lab work showed blood sugar more than 1200, pH of 6.9, UA showed ketones.  Also found to have sodium of 123, potassium 5.5, creatinine of 1.78, phosphorus of 11.9, elevated liver enzymes, WBC count of 11.5, lactate  of more than 9 patient was admitted for management of diabetic ketoacidosis.  Admitted under PCCM service.  Transferred to [facility] service on 6/1.  Diabetic coordinator consulted.  Hospital course remarkable for fluctuation in blood sugar.  Patient has been counseled not to skip his insulin at home.  Medically stable for discharge to home today.  Following problems were addressed during the hospitalization:  DKA/diabetes type 1/severe acidosis: History of noncompliance, multiple admissions for DKA in the past.  Report of not taking insulin for last 2 days.  Started on insulin drip.  Gap closed.Started on long-acting and sliding scale insulin. Diabetic coordinator consulted.   A1c as per 4/22 /24 was 14.2.  Patient follows with endocrinology. Patient has been counseled not to skip insulin at home.  He frequently skips insulin doses.   Leukocytosis/elevated lactate: This is most likely from hemoconcentration/dehydration.  Low suspicion for infectious etiology.  Antibiotics stopped.  Resolved  with IV fluids   Severe hyponatremia: In the setting of hyperosmolar hyponatremia from severe hyperglycemia.  Sodium level has been stable now.   AKI /hyperkalemia/hyperphosphatemia: Prerenal secondary to severe dehydration.  Kidney function has normalized.  Currently potassium level normal.     Elevated liver enzymes:  Found to be elevated last month as well.  Hepatitis panel negative.  On reviewing his previous lab works, he has elevated liver enzymes.  History of alcohol abuse.  Right upper quadrant ultrasound did not show any cholecystitis but showed increased hepatic echogenicity consistent with hepatic steatosis.  Stable   Acute metabolic encephalopathy: Likely from severe acidosis.  CT head did not show any acute intracranial findings.  This morning he is alert oriented.   History of aortic thrombus: Continue Xarelto   Systolic congestive heart failure: Most recent echocardiogram showed EF of 45 to 50%, no wall motion abnormality, grade 1 diastolic dysfunction.  Dehydrated on presentation and was given IV fluids   Schizophrenia/major depressive disorder: On Abilify, trazodone at home.   Tobacco use/cannabis use: Counseled cessation.  He smokes a pack a day.  Continue nicotine patch  Discharge Diagnoses:  Principal Problem:   DKA (diabetic ketoacidosis) (HCC)    Discharge Instructions  Discharge Instructions     Diet Carb Modified   Complete by: As directed    Discharge instructions   Complete by: As directed    1)Please monitor your blood sugars 2)Follow up with your PCP and endocrinologist for the diabetic management.  Continue to monitor blood sugars at home.  Do not skip insulin   Increase activity slowly   Complete by: As directed    No wound care   Complete by: As  directed       Allergies as of 05/02/2023       Reactions   Nitrous Oxide Nausea And Vomiting        Medication List     TAKE these medications    ARIPiprazole 10 MG tablet Commonly known as:  ABILIFY Take 1 tablet (10 mg total) by mouth daily.   aspirin 81 MG chewable tablet Chew 1 tablet (81 mg total) by mouth daily.   feeding supplement (GLUCERNA SHAKE) Liqd Take 237 mLs by mouth 2 (two) times daily between meals.   folic acid 1 MG tablet Commonly known as: FOLVITE Take 1 tablet (1 mg total) by mouth daily.   insulin aspart 100 UNIT/ML FlexPen Commonly known as: NOVOLOG For glucose 121 to 150 use 1 unit, for 151 to 200 use 2 units, for 201 to 250 use 3 units, for 251 to 300 use 5 units, for 301 to 350 use 7 units for 351 or greater use 9 units. What changed:  how much to take how to take this when to take this additional instructions   insulin detemir 100 UNIT/ML FlexPen Commonly known as: LEVEMIR Inject 8 Units into the skin at bedtime.   metoCLOPramide 5 MG tablet Commonly known as: Reglan Take 1 tablet (5 mg total) by mouth every 8 (eight) hours as needed for nausea or vomiting.   nicotine 21 mg/24hr patch Commonly known as: NICODERM CQ - dosed in mg/24 hours Place 1 patch (21 mg total) onto the skin daily. Start taking on: May 03, 2023   pantoprazole 40 MG tablet Commonly known as: PROTONIX Take 1 tablet (40 mg total) by mouth daily.   rivaroxaban 20 MG Tabs tablet Commonly known as: XARELTO Take 1 tablet (20 mg total) by mouth daily with supper. Take this when the xarelto starter pack is completed.   traZODone 50 MG tablet Commonly known as: DESYREL Take 1 tablet (50 mg total) by mouth at bedtime.   Ventolin HFA 108 (90 Base) MCG/ACT inhaler Generic drug: albuterol Inhale 1 puff into the lungs every 4 (four) hours as needed for wheezing.        Follow-up Information     Montez Hageman, DO. Schedule an appointment as soon as possible for a visit in 1 week(s).   Specialty: Family Medicine Contact information: [phone] [place] [address] [phone]                Allergies  Allergen Reactions   Nitrous Oxide  Nausea And Vomiting    Consultations: None   Procedures/Studies: CT Head Wo Contrast  Result Date: 04/30/2023 CLINICAL DATA:  52 year old male with altered mental status, has not taking insulin for 2 days, suspected DKA. EXAM: CT HEAD WITHOUT CONTRAST TECHNIQUE: Contiguous axial images were obtained from the base of the skull through the vertex without intravenous contrast. RADIATION DOSE REDUCTION: This exam was performed according to the departmental dose-optimization program which includes automated exposure control, adjustment of the mA and/or kV according to patient size and/or use of iterative reconstruction technique. COMPARISON:  Head CT 03/25/2023. FINDINGS: Brain: Small chronic right caudate lacunar infarct appears unchanged. Normal background brain volume. No midline shift, ventriculomegaly, mass effect, evidence of mass lesion, intracranial hemorrhage or evidence of cortically based acute infarction. Elsewhere stable, normal gray-white differentiation. Vascular: No suspicious intracranial vascular hyperdensity. Skull: No acute osseous abnormality identified. Sinuses/Orbits: Visualized paranasal sinuses and mastoids are clear. Other: Visualized orbits and scalp soft tissues are within normal limits. IMPRESSION:  1. No acute intracranial abnormality. 2. Small chronic right caudate lacunar infarct. Electronically Signed   By: Odessa Fleming M.D.   On: 04/30/2023 11:32   DG Chest Portable 1 View  Result Date: 04/30/2023 CLINICAL DATA:  Shortness of breath. EXAM: PORTABLE CHEST 1 VIEW COMPARISON:  X-ray 03/19/2023.  CT angiogram 03/21/2023 FINDINGS: Healing left-sided rib fracture once again identified. No consolidation, edema or effusion. Normal cardiopericardial silhouette. Overlapping cardiac leads. Along both sides there is a change in density peripherally in the lungs. This being related to overlapping structures rather than small pneumothorax. IMPRESSION: Overlapping asymmetric density  bilaterally. Legrand Rams this being overlapping structure rather than subtle pneumothorax. Recommend short follow-up or cross-sectional imaging as clinically appropriate for concern. Stable left-sided rib fracture. Electronically Signed   By: Karen Kays M.D.   On: 04/30/2023 11:14      Subjective: Patient seen and examined at bedside today.  Hemodynamically stable.  Very eager to go home.  Blood sugars were very high this morning in the range of 500s.  Follow-up blood sugars were less than 400.  Patient has been counseled to continue his insulin and monitor blood sugars at home.  Medically stable for discharge  Discharge Exam: Vitals:   05/02/23 0454 05/02/23 0756  BP: 130/84 110/85  Pulse: 95 99  Resp:  16  Temp: 97.9 F (36.6 C) 97.9 F (36.6 C)  SpO2: 96% 97%   Vitals:   05/01/23 1541 05/01/23 1951 05/02/23 0454 05/02/23 0756  BP: 106/82 112/86 130/84 110/85  Pulse: 83 94 95 99  Resp: 16 16  16   Temp: (!) 97.5 F (36.4 C) 98.4 F (36.9 C) 97.9 F (36.6 C) 97.9 F (36.6 C)  TempSrc: Oral Oral Oral Oral  SpO2: 94% 99% 96% 97%  Weight:      Height:        General: Pt is alert, awake, not in acute distress Cardiovascular: RRR, S1/S2 +, no rubs, no gallops Respiratory: CTA bilaterally, no wheezing, no rhonchi Abdominal: Soft, NT, ND, bowel sounds + Extremities: no edema, no cyanosis    The results of significant diagnostics from this hospitalization (including imaging, microbiology, ancillary and laboratory) are listed below for reference.     Microbiology: Recent Results (from the past 240 hour(s))  Urine Culture (for pregnant, neutropenic or urologic patients or patients with an indwelling urinary catheter)     Status: None   Collection Time: 04/30/23 11:50 AM   Specimen: Urine, Clean Catch  Result Value Ref Range Status   Specimen Description   Final    URINE, CLEAN CATCH Performed at [facility], [address] [place], [place] [address]    Special  Requests   Final    NONE Performed at [facility], [address] [place], [place] [address]    Culture   Final    NO GROWTH Performed at [facility], [address] [place], [place] [address]    Report Status 05/01/2023 FINAL  Final  Blood culture (routine x 2)     Status: None (Preliminary result)   Collection Time: 04/30/23 12:35 PM   Specimen: BLOOD  Result Value Ref Range Status   Specimen Description   Final    BLOOD BLOOD RIGHT FOREARM Performed at [facility], [address] [place], [place] [address]    Special Requests   Final    Blood Culture adequate volume Blood Culture results may not be optimal due to  an inadequate volume of blood received in culture bottles Performed at [facility] [place], [address] [place], [place] [address]    Culture   Final    NO GROWTH 2 DAYS Performed at [facility], [address], [place], [place] [address]    Report Status PENDING  Incomplete  MRSA Next Gen by PCR, Nasal     Status: None   Collection Time: 04/30/23  3:27 PM   Specimen: Nasal Mucosa; Nasal Swab  Result Value Ref Range Status   MRSA by PCR Next Gen NOT DETECTED NOT DETECTED Final    Comment: (NOTE) The GeneXpert MRSA Assay (FDA approved for NASAL specimens only), is one component of a comprehensive MRSA colonization surveillance program. It is not intended to diagnose MRSA infection nor to guide or monitor treatment for MRSA infections. Test performance is not FDA approved in patients less than 40 years old. Performed at [facility], [address] [place], [place] [address]   Blood culture (routine x 2)     Status: None (Preliminary result)   Collection Time: 04/30/23  3:53 PM   Specimen: BLOOD RIGHT ARM  Result Value Ref Range Status   Specimen Description BLOOD RIGHT ARM  Final   Special Requests   Final    BOTTLES DRAWN AEROBIC AND ANAEROBIC Blood Culture adequate volume   Culture   Final    NO  GROWTH 2 DAYS Performed at [facility], [address], [place], [place] [address]    Report Status PENDING  Incomplete     Labs: BNP (last 3 results) No results for input(s): "BNP" in the last 8760 hours. Basic Metabolic Panel: Recent Labs  Lab 04/30/23 1130 04/30/23 1538 04/30/23 1918 04/30/23 2152 05/01/23 0048 05/01/23 0847 05/02/23 0046  NA  --  134* 135 136 134* 132* 131*  K  --  3.7 3.8 3.9 3.9 4.0 3.8  CL  --  92* 94* 97* 96* 95* 95*  CO2  --  16* 26 25 26 28 26   GLUCOSE  --  512* 206* 178* 180* 179* 201*  BUN  --  28* 22* 20 17 14 11   CREATININE  --  1.62* 1.11 0.99 0.92 0.90 0.70  CALCIUM  --  10.0 9.7 9.3 8.7* 8.4* 8.2*  MG 2.9* 2.2  --   --   --  1.9  --   PHOS 11.9* 2.3*  --   --   --  2.0* 3.7   Liver Function Tests: Recent Labs  Lab 04/30/23 1045 05/01/23 0847  AST 144* 57*  ALT 190* 105*  ALKPHOS 226* 130*  BILITOT 1.4* 0.3  PROT 7.8 5.1*  ALBUMIN 4.8 3.0*   No results for input(s): "LIPASE", "AMYLASE" in the last 168 hours. Recent Labs  Lab 04/30/23 1150  AMMONIA 36*   CBC: Recent Labs  Lab 04/30/23 1039 04/30/23 1045 05/01/23 0847 05/02/23 0046  WBC  --  11.5* 13.2* 9.3  HGB 15.6 12.9* 10.2* 10.4*  HCT 46.0 41.3 30.1* 30.7*  MCV  --  107.8* 97.1 98.4  PLT  --  372 232 177   Cardiac Enzymes: No results for input(s): "CKTOTAL", "CKMB", "CKMBINDEX", "TROPONINI" in the last 168 hours. BNP: Invalid input(s): "POCBNP" CBG: Recent Labs  Lab 05/02/23 0753 05/02/23 1010 05/02/23 1131 05/02/23 1239 05/02/23 1350  GLUCAP 581* 559* 528* 325* 233*   D-Dimer No results for input(s): "DDIMER" in the last 72 hours. Hgb A1c No results  for input(s): "HGBA1C" in the last 72 hours. Lipid Profile No results for input(s): "CHOL", "HDL", "LDLCALC", "TRIG", "CHOLHDL", "LDLDIRECT" in the last 72 hours. Thyroid function studies No results for input(s): "TSH", "T4TOTAL", "T3FREE", "THYROIDAB" in the last 72 hours.  Invalid input(s):  "FREET3" Anemia work up No results for input(s): "VITAMINB12", "FOLATE", "FERRITIN", "TIBC", "IRON", "RETICCTPCT" in the last 72 hours. Urinalysis    Component Value Date/Time   COLORURINE STRAW (A) 04/30/2023 1150   APPEARANCEUR CLEAR 04/30/2023 1150   LABSPEC 1.020 04/30/2023 1150   PHURINE 5.5 04/30/2023 1150   GLUCOSEU >=500 (A) 04/30/2023 1150   HGBUR TRACE (A) 04/30/2023 1150   BILIRUBINUR NEGATIVE 04/30/2023 1150   KETONESUR >=80 (A) 04/30/2023 1150   PROTEINUR NEGATIVE 04/30/2023 1150   NITRITE NEGATIVE 04/30/2023 1150   LEUKOCYTESUR NEGATIVE 04/30/2023 1150   Sepsis Labs Recent Labs  Lab 04/30/23 1045 05/01/23 0847 05/02/23 0046  WBC 11.5* 13.2* 9.3   Microbiology Recent Results (from the past 240 hour(s))  Urine Culture (for pregnant, neutropenic or urologic patients or patients with an indwelling urinary catheter)     Status: None   Collection Time: 04/30/23 11:50 AM   Specimen: Urine, Clean Catch  Result Value Ref Range Status   Specimen Description   Final    URINE, CLEAN CATCH Performed at [facility], [address] [place], [place] [address]    Special Requests   Final    NONE Performed at [facility], [address] [place], [place] [address]    Culture   Final    NO GROWTH Performed at [facility], [address], [place], [place] [address]    Report Status 05/01/2023 FINAL  Final  Blood culture (routine x 2)     Status: None (Preliminary result)   Collection Time: 04/30/23 12:35 PM   Specimen: BLOOD  Result Value Ref Range Status   Specimen Description   Final    BLOOD BLOOD RIGHT FOREARM Performed at [facility], [address] [place], [place] [address]    Special Requests   Final    Blood Culture adequate volume Blood Culture results may not be optimal due to an inadequate volume of blood received in culture bottles Performed at [facility], [address] [place], [place]  [address]    Culture   Final    NO GROWTH 2 DAYS Performed at [facility], [address], [place], [place] [address]    Report Status PENDING  Incomplete  MRSA Next Gen by PCR, Nasal     Status: None   Collection Time: 04/30/23  3:27 PM   Specimen: Nasal Mucosa; Nasal Swab  Result Value Ref Range Status   MRSA by PCR Next Gen NOT DETECTED NOT DETECTED Final    Comment: (NOTE) The GeneXpert MRSA Assay (FDA approved for NASAL specimens only), is one component of a comprehensive MRSA colonization surveillance program. It is not intended to diagnose MRSA infection nor to guide or monitor treatment for MRSA infections. Test performance is not FDA approved in patients less than 82 years old. Performed at [facility], [address], [place], [place] [address]   Blood culture (routine x 2)     Status: None (Preliminary result)   Collection Time: 04/30/23  3:53 PM   Specimen: BLOOD RIGHT ARM  Result Value Ref Range Status   Specimen Description BLOOD RIGHT ARM  Final  Special Requests   Final    BOTTLES DRAWN AEROBIC AND ANAEROBIC Blood Culture adequate volume   Culture   Final    NO GROWTH 2 DAYS Performed at [facility], [address] [place], [place] [address]    Report Status PENDING  Incomplete    Please note: You were cared for by a hospitalist during your hospital stay. Once you are discharged, your primary care physician will handle any further medical issues. Please note that NO REFILLS for any discharge medications will be authorized once you are discharged, as it is imperative that you return to your primary care physician (or establish a relationship with a primary care physician if you do not have one) for your post hospital discharge needs so that they can reassess your need for medications and monitor your lab values.    Time coordinating discharge: 40 minutes  SIGNED:   Burnadette Pop, MD  [facility] 05/02/2023, 2:31 PM Pager  [phone]  If 7PM-7AM, please contact night-coverage [email or web address] Password [email or web address]

## 2023-05-02 NOTE — Inpatient Diabetes Management (Signed)
Inpatient Diabetes Program Recommendations  AACE/ADA: New Consensus Statement on Inpatient Glycemic Control (2015)  Target Ranges:  Prepandial:   less than 140 mg/dL      Peak postprandial:   less than 180 mg/dL (1-2 hours)      Critically ill patients:  140 - 180 mg/dL   Lab Results  Component Value Date   GLUCAP 528 (HH) 05/02/2023   HGBA1C 14.2 (H) 03/22/2023    Review of Glycemic Control  Diabetes history: Type 1 Dm Outpatient Diabetes medications: Levemir 8 units QHS, Novolog 1-18 units QID Current orders for Inpatient glycemic control: Semglee 8 units QD, novolog 0-15 units TId & HS Novolog 20 units x 1 Novolog 10 units x 1   Inpatient Diabetes Program Recommendations:    Noted hyperglycemia this AM of >500's mg/dL. Consider adding Novolog 3-4 units TID (assuming patient consuming >50% of meals) and discontinuing Novolog 10 units x 1.  Secure chat sent to RN to determine CHo intake this AM. Patient ate prior to 0800 CBG thus explaining elevation.    Thanks, Lujean Rave, MSN, RNC-OB Diabetes Coordinator [phone] (8a-5p)

## 2023-05-03 LAB — CULTURE, BLOOD (ROUTINE X 2)

## 2023-05-04 LAB — CULTURE, BLOOD (ROUTINE X 2): Culture: NO GROWTH

## 2023-05-21 ENCOUNTER — Observation Stay (HOSPITAL_BASED_OUTPATIENT_CLINIC_OR_DEPARTMENT_OTHER)
Admission: EM | Admit: 2023-05-21 | Discharge: 2023-05-22 | Disposition: A | Discharge status: Home / Self Care | Payer: Medicaid Other | Attending: Family Medicine | Admitting: Family Medicine

## 2023-05-21 ENCOUNTER — Encounter (HOSPITAL_BASED_OUTPATIENT_CLINIC_OR_DEPARTMENT_OTHER): Payer: Self-pay

## 2023-05-21 ENCOUNTER — Other Ambulatory Visit: Payer: Self-pay

## 2023-05-21 DIAGNOSIS — F1290 Cannabis use, unspecified, uncomplicated: Secondary | ICD-10-CM | POA: Insufficient documentation

## 2023-05-21 DIAGNOSIS — E1110 Type 2 diabetes mellitus with ketoacidosis without coma: Secondary | ICD-10-CM | POA: Diagnosis not present

## 2023-05-21 DIAGNOSIS — Z794 Long term (current) use of insulin: Secondary | ICD-10-CM | POA: Insufficient documentation

## 2023-05-21 DIAGNOSIS — Z79899 Other long term (current) drug therapy: Secondary | ICD-10-CM | POA: Insufficient documentation

## 2023-05-21 DIAGNOSIS — J45909 Unspecified asthma, uncomplicated: Secondary | ICD-10-CM | POA: Insufficient documentation

## 2023-05-21 DIAGNOSIS — I5022 Chronic systolic (congestive) heart failure: Secondary | ICD-10-CM | POA: Diagnosis not present

## 2023-05-21 DIAGNOSIS — Z7901 Long term (current) use of anticoagulants: Secondary | ICD-10-CM | POA: Diagnosis not present

## 2023-05-21 DIAGNOSIS — Z7982 Long term (current) use of aspirin: Secondary | ICD-10-CM | POA: Diagnosis not present

## 2023-05-21 DIAGNOSIS — E1165 Type 2 diabetes mellitus with hyperglycemia: Secondary | ICD-10-CM

## 2023-05-21 DIAGNOSIS — R7401 Elevation of levels of liver transaminase levels: Secondary | ICD-10-CM | POA: Insufficient documentation

## 2023-05-21 DIAGNOSIS — F209 Schizophrenia, unspecified: Secondary | ICD-10-CM

## 2023-05-21 DIAGNOSIS — Z8616 Personal history of COVID-19: Secondary | ICD-10-CM | POA: Insufficient documentation

## 2023-05-21 DIAGNOSIS — Z91040 Latex allergy status: Secondary | ICD-10-CM | POA: Insufficient documentation

## 2023-05-21 DIAGNOSIS — I8290 Acute embolism and thrombosis of unspecified vein: Secondary | ICD-10-CM | POA: Diagnosis present

## 2023-05-21 DIAGNOSIS — E1010 Type 1 diabetes mellitus with ketoacidosis without coma: Secondary | ICD-10-CM

## 2023-05-21 DIAGNOSIS — N179 Acute kidney failure, unspecified: Secondary | ICD-10-CM | POA: Insufficient documentation

## 2023-05-21 DIAGNOSIS — E440 Moderate protein-calorie malnutrition: Secondary | ICD-10-CM | POA: Diagnosis not present

## 2023-05-21 LAB — CBC WITH DIFFERENTIAL/PLATELET
Abs Immature Granulocytes: 0.12 10*3/uL — ABNORMAL HIGH (ref 0.00–0.07)
Basophils Absolute: 0.1 10*3/uL (ref 0.0–0.1)
Basophils Relative: 0 %
Eosinophils Absolute: 0 10*3/uL (ref 0.0–0.5)
Eosinophils Relative: 0 %
HCT: 43 % (ref 39.0–52.0)
Hemoglobin: 14.7 g/dL (ref 13.0–17.0)
Immature Granulocytes: 1 %
Lymphocytes Relative: 15 %
Lymphs Abs: 2.5 10*3/uL (ref 0.7–4.0)
MCH: 33.3 pg (ref 26.0–34.0)
MCHC: 34.2 g/dL (ref 30.0–36.0)
MCV: 97.3 fL (ref 80.0–100.0)
Monocytes Absolute: 1.8 10*3/uL — ABNORMAL HIGH (ref 0.1–1.0)
Monocytes Relative: 11 %
Neutro Abs: 12.3 10*3/uL — ABNORMAL HIGH (ref 1.7–7.7)
Neutrophils Relative %: 73 %
Platelets: 383 10*3/uL (ref 150–400)
RBC: 4.42 MIL/uL (ref 4.22–5.81)
RDW: 11.9 % (ref 11.5–15.5)
WBC: 16.9 10*3/uL — ABNORMAL HIGH (ref 4.0–10.5)
nRBC: 0 % (ref 0.0–0.2)

## 2023-05-21 LAB — URINALYSIS, ROUTINE W REFLEX MICROSCOPIC
Glucose, UA: >=500 mg/dL — AB
Ketones, ur: >=80 mg/dL — AB
Leukocytes,Ua: NEGATIVE
Nitrite: NEGATIVE
Protein, ur: 100 mg/dL — AB
Specific Gravity, Urine: >=1.03 (ref 1.005–1.030)
pH: 5.5 (ref 5.0–8.0)

## 2023-05-21 LAB — LIPASE, BLOOD: Lipase: 28 U/L (ref 11–51)

## 2023-05-21 LAB — BASIC METABOLIC PANEL
Anion gap: 19 — ABNORMAL HIGH (ref 5–15)
Anion gap: 10 (ref 5–15)
Anion gap: 10 (ref 5–15)
BUN: 28 mg/dL — ABNORMAL HIGH (ref 6–20)
BUN: 26 mg/dL — ABNORMAL HIGH (ref 6–20)
BUN: 21 mg/dL — ABNORMAL HIGH (ref 6–20)
CO2: 15 mmol/L — ABNORMAL LOW (ref 22–32)
CO2: 25 mmol/L (ref 22–32)
CO2: 25 mmol/L (ref 22–32)
Calcium: 8.6 mg/dL — ABNORMAL LOW (ref 8.9–10.3)
Calcium: 8.4 mg/dL — ABNORMAL LOW (ref 8.9–10.3)
Calcium: 8.2 mg/dL — ABNORMAL LOW (ref 8.9–10.3)
Chloride: 93 mmol/L — ABNORMAL LOW (ref 98–111)
Chloride: 92 mmol/L — ABNORMAL LOW (ref 98–111)
Chloride: 92 mmol/L — ABNORMAL LOW (ref 98–111)
Creatinine, Ser: 1.24 mg/dL (ref 0.61–1.24)
Creatinine, Ser: 0.88 mg/dL (ref 0.61–1.24)
Creatinine, Ser: 0.81 mg/dL (ref 0.61–1.24)
GFR, Estimated: >60 mL/min (ref >60)
GFR, Estimated: >60 mL/min (ref >60)
GFR, Estimated: >60 mL/min (ref >60)
Glucose, Bld: 247 mg/dL — ABNORMAL HIGH (ref 70–99)
Glucose, Bld: 141 mg/dL — ABNORMAL HIGH (ref 70–99)
Glucose, Bld: 152 mg/dL — ABNORMAL HIGH (ref 70–99)
Potassium: 3.6 mmol/L (ref 3.5–5.1)
Potassium: 4.1 mmol/L (ref 3.5–5.1)
Potassium: 3.7 mmol/L (ref 3.5–5.1)
Sodium: 127 mmol/L — ABNORMAL LOW (ref 135–145)
Sodium: 127 mmol/L — ABNORMAL LOW (ref 135–145)
Sodium: 127 mmol/L — ABNORMAL LOW (ref 135–145)

## 2023-05-21 LAB — RAPID URINE DRUG SCREEN, HOSP PERFORMED
Amphetamines: NOT DETECTED
Barbiturates: NOT DETECTED
Benzodiazepines: NOT DETECTED
Cocaine: NOT DETECTED
Opiates: NOT DETECTED
Tetrahydrocannabinol: POSITIVE — AB

## 2023-05-21 LAB — COMPREHENSIVE METABOLIC PANEL
ALT: 78 U/L — ABNORMAL HIGH (ref 0–44)
AST: 69 U/L — ABNORMAL HIGH (ref 15–41)
Albumin: 4.8 g/dL (ref 3.5–5.0)
Alkaline Phosphatase: 211 U/L — ABNORMAL HIGH (ref 38–126)
Anion gap: 34 — ABNORMAL HIGH (ref 5–15)
BUN: 36 mg/dL — ABNORMAL HIGH (ref 6–20)
CO2: 14 mmol/L — ABNORMAL LOW (ref 22–32)
Calcium: 9.3 mg/dL (ref 8.9–10.3)
Chloride: 76 mmol/L — ABNORMAL LOW (ref 98–111)
Creatinine, Ser: 1.56 mg/dL — ABNORMAL HIGH (ref 0.61–1.24)
GFR, Estimated: 54 mL/min — ABNORMAL LOW (ref >60)
Glucose, Bld: 679 mg/dL (ref 70–99)
Potassium: 5 mmol/L (ref 3.5–5.1)
Sodium: 124 mmol/L — ABNORMAL LOW (ref 135–145)
Total Bilirubin: 2.5 mg/dL — ABNORMAL HIGH (ref 0.3–1.2)
Total Protein: 8.2 g/dL — ABNORMAL HIGH (ref 6.5–8.1)

## 2023-05-21 LAB — I-STAT VENOUS BLOOD GAS, ED
Acid-base deficit: 12 mmol/L — ABNORMAL HIGH (ref 0.0–2.0)
Bicarbonate: 15.5 mmol/L — ABNORMAL LOW (ref 20.0–28.0)
Calcium, Ion: 1.02 mmol/L — ABNORMAL LOW (ref 1.15–1.40)
HCT: 51 % (ref 39.0–52.0)
Hemoglobin: 17.3 g/dL — ABNORMAL HIGH (ref 13.0–17.0)
O2 Saturation: 48 %
Patient temperature: 97.3
Potassium: 4.8 mmol/L (ref 3.5–5.1)
Sodium: 120 mmol/L — ABNORMAL LOW (ref 135–145)
TCO2: 17 mmol/L — ABNORMAL LOW (ref 22–32)
pCO2, Ven: 37.7 mmHg — ABNORMAL LOW (ref 44–60)
pH, Ven: 7.217 — ABNORMAL LOW (ref 7.25–7.43)
pO2, Ven: 30 mmHg — CL (ref 32–45)

## 2023-05-21 LAB — GLUCOSE, CAPILLARY
Glucose-Capillary: 365 mg/dL — ABNORMAL HIGH (ref 70–99)
Glucose-Capillary: 468 mg/dL — ABNORMAL HIGH (ref 70–99)
Glucose-Capillary: 231 mg/dL — ABNORMAL HIGH (ref 70–99)
Glucose-Capillary: 211 mg/dL — ABNORMAL HIGH (ref 70–99)
Glucose-Capillary: 195 mg/dL — ABNORMAL HIGH (ref 70–99)
Glucose-Capillary: 137 mg/dL — ABNORMAL HIGH (ref 70–99)
Glucose-Capillary: 116 mg/dL — ABNORMAL HIGH (ref 70–99)
Glucose-Capillary: 182 mg/dL — ABNORMAL HIGH (ref 70–99)
Glucose-Capillary: 190 mg/dL — ABNORMAL HIGH (ref 70–99)

## 2023-05-21 LAB — URINALYSIS, MICROSCOPIC (REFLEX)

## 2023-05-21 LAB — CBG MONITORING, ED
Glucose-Capillary: 556 mg/dL (ref 70–99)
Glucose-Capillary: >600 mg/dL (ref 70–99)

## 2023-05-21 LAB — BETA-HYDROXYBUTYRIC ACID
Beta-Hydroxybutyric Acid: >8 mmol/L — ABNORMAL HIGH (ref 0.05–0.27)
Beta-Hydroxybutyric Acid: 1.86 mmol/L — ABNORMAL HIGH (ref 0.05–0.27)

## 2023-05-21 LAB — OSMOLALITY: Osmolality: 329 mOsm/kg (ref 275–295)

## 2023-05-21 LAB — MRSA NEXT GEN BY PCR, NASAL: MRSA by PCR Next Gen: NOT DETECTED

## 2023-05-21 MED ORDER — POTASSIUM CHLORIDE 10 MEQ/100ML IV SOLN
10.0000 meq | INTRAVENOUS | Status: DC
  Administered 2023-05-21: 10 meq via INTRAVENOUS
  Filled 2023-05-21: qty 100

## 2023-05-21 MED ORDER — ONDANSETRON HCL 4 MG PO TABS: 4.0000 mg | ORAL_TABLET | Freq: Four times a day (QID) | ORAL | Status: DC | PRN

## 2023-05-21 MED ORDER — CHLORHEXIDINE GLUCONATE CLOTH 2 % EX PADS
6.0000 | MEDICATED_PAD | Freq: Every day | CUTANEOUS | Status: DC
  Administered 2023-05-21: 6 via TOPICAL

## 2023-05-21 MED ORDER — ACETAMINOPHEN 325 MG PO TABS
650.0000 mg | ORAL_TABLET | Freq: Four times a day (QID) | ORAL | Status: DC | PRN
  Administered 2023-05-21 – 2023-05-22 (×2): 650 mg via ORAL
  Filled 2023-05-21 (×2): qty 2

## 2023-05-21 MED ORDER — INSULIN REGULAR(HUMAN) IN NACL 100-0.9 UT/100ML-% IV SOLN
INTRAVENOUS | Status: DC
  Administered 2023-05-21: 6.5 [IU]/h via INTRAVENOUS
  Filled 2023-05-21: qty 100

## 2023-05-21 MED ORDER — SODIUM CHLORIDE 0.9 % IV SOLN
12.5000 mg | Freq: Four times a day (QID) | INTRAVENOUS | Status: DC | PRN
  Administered 2023-05-21: 12.5 mg via INTRAVENOUS
  Filled 2023-05-21: qty 12.5

## 2023-05-21 MED ORDER — LACTATED RINGERS IV BOLUS
1000.0000 mL | Freq: Once | INTRAVENOUS | Status: AC
  Administered 2023-05-21: 1000 mL via INTRAVENOUS

## 2023-05-21 MED ORDER — ORAL CARE MOUTH RINSE: 15.0000 mL | OROMUCOSAL | Status: DC | PRN

## 2023-05-21 MED ORDER — ASPIRIN 81 MG PO CHEW
81.0000 mg | CHEWABLE_TABLET | Freq: Every day | ORAL | Status: DC
  Administered 2023-05-21 – 2023-05-22 (×2): 81 mg via ORAL
  Filled 2023-05-21 (×2): qty 1

## 2023-05-21 MED ORDER — ACETAMINOPHEN 650 MG RE SUPP: 650.0000 mg | Freq: Four times a day (QID) | RECTAL | Status: DC | PRN

## 2023-05-21 MED ORDER — DEXTROSE IN LACTATED RINGERS 5 % IV SOLN: INTRAVENOUS | Status: DC

## 2023-05-21 MED ORDER — DEXTROSE 50 % IV SOLN: 0.0000 mL | INTRAVENOUS | Status: DC | PRN

## 2023-05-21 MED ORDER — RIVAROXABAN 20 MG PO TABS
20.0000 mg | ORAL_TABLET | Freq: Every day | ORAL | Status: DC
  Administered 2023-05-21: 20 mg via ORAL
  Filled 2023-05-21: qty 1

## 2023-05-21 MED ORDER — ONDANSETRON HCL 4 MG/2ML IJ SOLN
4.0000 mg | Freq: Four times a day (QID) | INTRAMUSCULAR | Status: DC | PRN
  Administered 2023-05-21: 4 mg via INTRAVENOUS
  Filled 2023-05-21: qty 2

## 2023-05-21 MED ORDER — SODIUM CHLORIDE 0.9 % IV SOLN: INTRAVENOUS | Status: DC | PRN

## 2023-05-21 MED ORDER — LACTATED RINGERS IV SOLN: INTRAVENOUS | Status: DC

## 2023-05-21 MED ORDER — ONDANSETRON HCL 4 MG/2ML IJ SOLN
4.0000 mg | Freq: Once | INTRAMUSCULAR | Status: AC
  Administered 2023-05-21: 4 mg via INTRAVENOUS
  Filled 2023-05-21: qty 2

## 2023-05-21 NOTE — ED Provider Notes (Signed)
[place] [facility] AT [facility] Provider Note  CSN: [id number] Arrival date & time: 05/21/23 4782  Chief Complaint(s) Hyperglycemia  HPI Nina Hoar is a 52 y.o. male with past medical history as below, significant for schizophrenia, DM, MDD, HFrEF, asthma, depression who presents to the ED with complaint of nausea, abdominal pain, increased thirst.  Patient reports he has been having nausea and vomiting throughout the evening, some streaks of blood noted in his vomit.  Some diarrhea, no BRBPR or melena.  Generalized abdominal pain. No fevers or chills.  Reports has been taking his medications as prescribed, no recent travel or sick contacts, no fevers or chills.  No changes to urination.  Does report increased thirst over the past few hours.  Denies any illicit drug use or alcohol use.  Does continue to use tobacco products.  No chest pain or dyspnea  Sees Dr Katrinka Blazing endocrinology. Admitted 5/24 with DKA.  Echocardiogram 4/24 with LVEF 45 to 50% with G1 DD  Past Medical History Past Medical History:  Diagnosis Date   Asthma    Depression    Diabetes mellitus without complication (HCC)    Schizophrenia (HCC)    Uses self-applied continuous glucose monitoring device    Patient Active Problem List   Diagnosis Date Noted   Depressed left ventricular ejection fraction 03/23/2023   Major depressive disorder, recurrent episode, mild (HCC) 03/20/2023   Thrombus 03/20/2023   HFrEF (heart failure with reduced ejection fraction) (HCC) 03/20/2023   Hyponatremia 02/10/2023   COVID-19 virus infection 02/10/2023   Type 1 diabetes mellitus with hyperglycemia (HCC) 11/10/2021   GERD without esophagitis 11/04/2020   Malnutrition of moderate degree 10/07/2020   AKI (acute kidney injury) (HCC) 10/06/2020   Acute respiratory failure with hypoxia and hypercapnia (HCC) 10/06/2020   Acute metabolic encephalopathy 10/06/2020   T2DM (type 2 diabetes mellitus) (HCC) 10/06/2020    T1DM (type 1 diabetes mellitus) (HCC) 10/06/2020   Schizophrenia (HCC) 10/06/2020   Acute GI bleeding    High anion gap metabolic acidosis    DKA (diabetic ketoacidosis) (HCC) 10/03/2020   Moderate cannabis use disorder (HCC) 10/14/2016   Alcohol abuse 10/14/2016   Suicidal ideation 10/14/2016   Epigastric pain 12/24/2015   Dyspepsia 12/24/2015   Chronic fatigue 12/22/2015   Tardive dyskinesia 12/22/2015   Major depression 12/22/2015   Essential tremor 12/22/2015   Cigarette nicotine dependence without complication 12/22/2015   Home Medication(s) Prior to Admission medications   Medication Sig Start Date End Date Taking? Authorizing Provider  ARIPiprazole (ABILIFY) 10 MG tablet Take 1 tablet (10 mg total) by mouth daily. 03/23/23   Rai, Delene Ruffini, MD  aspirin 81 MG chewable tablet Chew 1 tablet (81 mg total) by mouth daily. 03/23/23   Rai, Ripudeep K, MD  feeding supplement, GLUCERNA SHAKE, (GLUCERNA SHAKE) LIQD Take 237 mLs by mouth 2 (two) times daily between meals. 01/15/23   [name], MD  folic acid (FOLVITE) 1 MG tablet Take 1 tablet (1 mg total) by mouth daily. 03/23/23   Rai, Ripudeep K, MD  insulin aspart (NOVOLOG) 100 UNIT/ML FlexPen For glucose 121 to 150 use 1 unit, for 151 to 200 use 2 units, for 201 to 250 use 3 units, for 251 to 300 use 5 units, for 301 to 350 use 7 units for 351 or greater use 9 units. Patient taking differently: Inject 1-18 Units into the skin in the morning, at noon, in the evening, and at bedtime. 10/07/20   [name], Mauricio  Reuel Boom, MD  insulin detemir (LEVEMIR) 100 UNIT/ML FlexPen Inject 8 Units into the skin at bedtime. 03/23/23   Rai, Delene Ruffini, MD  metoCLOPramide (REGLAN) 5 MG tablet Take 1 tablet (5 mg total) by mouth every 8 (eight) hours as needed for nausea or vomiting. 03/23/23   Rai, Ripudeep K, MD  nicotine (NICODERM CQ - DOSED IN MG/24 HOURS) 21 mg/24hr patch Place 1 patch (21 mg total) onto the skin daily. 05/03/23   Burnadette Pop, MD   pantoprazole (PROTONIX) 40 MG tablet Take 1 tablet (40 mg total) by mouth daily. 03/23/23   Rai, Delene Ruffini, MD  rivaroxaban (XARELTO) 20 MG TABS tablet Take 1 tablet (20 mg total) by mouth daily with supper. Take this when the xarelto starter pack is completed. 04/12/23   Rai, Delene Ruffini, MD  traZODone (DESYREL) 50 MG tablet Take 1 tablet (50 mg total) by mouth at bedtime. 03/24/23   Russella Dar, NP  VENTOLIN HFA 108 (90 Base) MCG/ACT inhaler Inhale 1 puff into the lungs every 4 (four) hours as needed for wheezing. 04/02/23   [provider]                                                                                                                                    Past Surgical History Past Surgical History:  Procedure Laterality Date   HERNIA REPAIR     Family History Family History  Problem Relation Age of Onset   Diabetes Maternal Grandfather     Social History Social History   Tobacco Use   Smoking status: Every Day    Packs/day: 2.50    Years: 0.00    Additional pack years: 0.00    Total pack years: 0.00    Types: Cigarettes   Smokeless tobacco: Never  Vaping Use   Vaping Use: Never used  Substance Use Topics   Alcohol use: Yes    Comment: daily  84 oz per day   Drug use: Yes    Types: Marijuana   Allergies Nitrous oxide  Review of Systems Review of Systems  Constitutional:  Negative for chills and fever.  HENT:  Negative for facial swelling and trouble swallowing.   Eyes:  Negative for photophobia and visual disturbance.  Respiratory:  Negative for cough and shortness of breath.   Cardiovascular:  Negative for chest pain and palpitations.  Gastrointestinal:  Positive for abdominal pain, diarrhea, nausea and vomiting.  Endocrine: Positive for polydipsia. Negative for polyuria.  Genitourinary:  Negative for difficulty urinating and hematuria.  Musculoskeletal:  Negative for gait problem and joint swelling.  Skin:  Negative for pallor and rash.   Neurological:  Negative for syncope and headaches.  Psychiatric/Behavioral:  Negative for agitation and confusion.     Physical Exam Vital Signs  I have reviewed the triage vital signs BP 116/80   Pulse (!) 117   Temp 99.7 F (37.6 C) (Temporal)  Resp (!) 24   Ht 5\' 9"  (1.753 m)   Wt 63.5 kg   SpO2 98%   BMI 20.67 kg/m  Physical Exam Vitals and nursing note reviewed.  Constitutional:      General: He is not in acute distress.    Appearance: He is well-developed.     Comments: Frail  HENT:     Head: Normocephalic and atraumatic. No raccoon eyes.     Jaw: No trismus.     Comments: Mouth and lips dry    Right Ear: External ear normal.     Left Ear: External ear normal.     Mouth/Throat:     Mouth: Mucous membranes are dry.  Eyes:     General: No scleral icterus. Cardiovascular:     Rate and Rhythm: Normal rate and regular rhythm.     Pulses: Normal pulses.     Heart sounds: Normal heart sounds.  Pulmonary:     Effort: Pulmonary effort is normal. No respiratory distress.     Breath sounds: Normal breath sounds.  Abdominal:     General: Abdomen is flat.     Palpations: Abdomen is soft.     Tenderness: There is no abdominal tenderness. There is no guarding.     Comments: Nonperitoneal  Musculoskeletal:        General: Normal range of motion.     Right lower leg: No edema.     Left lower leg: No edema.  Skin:    General: Skin is warm and dry.     Capillary Refill: Capillary refill takes less than 2 seconds.  Neurological:     Mental Status: He is alert and oriented to person, place, and time.     GCS: GCS eye subscore is 4. GCS verbal subscore is 5. GCS motor subscore is 6.  Psychiatric:        Mood and Affect: Mood normal.        Behavior: Behavior normal.     ED Results and Treatments Labs (all labs ordered are listed, but only abnormal results are displayed) Labs Reviewed  CBC WITH DIFFERENTIAL/PLATELET - Abnormal; Notable for the following components:       Result Value   WBC 16.9 (*)    Neutro Abs 12.3 (*)    Monocytes Absolute 1.8 (*)    Abs Immature Granulocytes 0.12 (*)    All other components within normal limits  COMPREHENSIVE METABOLIC PANEL - Abnormal; Notable for the following components:   Sodium 124 (*)    Chloride 76 (*)    CO2 14 (*)    Glucose, Bld 679 (*)    BUN 36 (*)    Creatinine, Ser 1.56 (*)    Total Protein 8.2 (*)    AST 69 (*)    ALT 78 (*)    Alkaline Phosphatase 211 (*)    Total Bilirubin 2.5 (*)    GFR, Estimated 54 (*)    Anion gap 34 (*)    All other components within normal limits  URINALYSIS, ROUTINE W REFLEX MICROSCOPIC - Abnormal; Notable for the following components:   Glucose, UA >=500 (*)    Hgb urine dipstick SMALL (*)    Bilirubin Urine SMALL (*)    Ketones, ur >=80 (*)    Protein, ur 100 (*)    All other components within normal limits  RAPID URINE DRUG SCREEN, HOSP PERFORMED - Abnormal; Notable for the following components:   Tetrahydrocannabinol POSITIVE (*)    All other components  within normal limits  URINALYSIS, MICROSCOPIC (REFLEX) - Abnormal; Notable for the following components:   Bacteria, UA MANY (*)    All other components within normal limits  CBG MONITORING, ED - Abnormal; Notable for the following components:   Glucose-Capillary 556 (*)    All other components within normal limits  I-STAT VENOUS BLOOD GAS, ED - Abnormal; Notable for the following components:   pH, Ven 7.217 (*)    pCO2, Ven 37.7 (*)    pO2, Ven 30 (*)    Bicarbonate 15.5 (*)    TCO2 17 (*)    Acid-base deficit 12.0 (*)    Sodium 120 (*)    Calcium, Ion 1.02 (*)    Hemoglobin 17.3 (*)    All other components within normal limits  CBG MONITORING, ED - Abnormal; Notable for the following components:   Glucose-Capillary >600 (*)    All other components within normal limits  LIPASE, BLOOD  BETA-HYDROXYBUTYRIC ACID  OSMOLALITY  BASIC METABOLIC PANEL  BASIC METABOLIC PANEL  BASIC METABOLIC PANEL   BETA-HYDROXYBUTYRIC ACID                                                                                                                          Radiology No results found.  Pertinent labs & imaging results that were available during my care of the patient were reviewed by me and considered in my medical decision making (see MDM for details).  Medications Ordered in ED Medications  insulin regular, human (MYXREDLIN) 100 units/ 100 mL infusion (6.5 Units/hr Intravenous New Bag/Given 05/21/23 0925)  lactated ringers infusion ( Intravenous New Bag/Given 05/21/23 0914)  dextrose 5 % in lactated ringers infusion (0 mLs Intravenous Hold 05/21/23 0903)  dextrose 50 % solution 0-50 mL (has no administration in time range)  potassium chloride 10 mEq in 100 mL IVPB (10 mEq Intravenous New Bag/Given 05/21/23 0921)  lactated ringers bolus 1,000 mL (0 mLs Intravenous Stopped 05/21/23 0913)  ondansetron (ZOFRAN) injection 4 mg (4 mg Intravenous Given 05/21/23 0737)  lactated ringers bolus 1,000 mL (0 mLs Intravenous Stopped 05/21/23 0847)                                                                                                                                     Procedures .Critical Care  Performed by: Sloan Leiter, DO Authorized by: Sloan Leiter, DO  Critical care provider statement:    Critical care time (minutes):  60   Critical care time was exclusive of:  Separately billable procedures and treating other patients   Critical care was necessary to treat or prevent imminent or life-threatening deterioration of the following conditions:  Metabolic crisis and dehydration   Critical care was time spent personally by me on the following activities:  Development of treatment plan with patient or surrogate, discussions with consultants, evaluation of patient's response to treatment, examination of patient, ordering and review of laboratory studies, ordering and review of radiographic studies,  ordering and performing treatments and interventions, pulse oximetry, re-evaluation of patient's condition, review of old charts and obtaining history from patient or surrogate   Care discussed with: admitting provider     (including critical care time)  Medical Decision Making / ED Course    Medical Decision Making:    Flavio Lindroth is a 52 y.o. male with past medical history as below, significant for schizophrenia, DM, MDD, HFrEF, asthma, depression who presents to the ED with complaint of nausea, abdominal pain, increased thirst.. The complaint involves an extensive differential diagnosis and also carries with it a high risk of complications and morbidity.  Serious etiology was considered. Ddx includes but is not limited to: Differential diagnosis includes but is not exclusive to acute appendicitis, renal colic, testicular torsion, urinary tract infection, prostatitis,  diverticulitis, small bowel obstruction, colitis, abdominal aortic aneurysm, gastroenteritis, enteritis, DKA, HHS, hyperglycemia etc.   Complete initial physical exam performed, notably the patient  was tachycardic, not hypoxic.  Nonperitoneal does appear dry.    Reviewed and confirmed nursing documentation for past medical history, family history, social history.  Vital signs reviewed.    Clinical Course as of 05/21/23 0944  Fri May 21, 2023  0803 pH, Ven(!): 7.217 [SG]  0803 Bicarbonate(!): 15.5 [SG]  0803 Glucose-Capillary(!!): 556 [SG]  0803 Concern for DKA, awaiting UA and metabolic panel. IVF in process [SG]  0827 Informed by nursing that betahydroxy will need to be send out and will have >2hours TOT [SG]  0846 Ketones, ur(!): >=80 [SG]  0847 Tetrahydrocannabinol(!): POSITIVE Advised pt to abstain from [facility] use in the future [SG]  0847 Counseled patient for approximately 3 minutes regarding smoking cessation. Discussed risks of smoking and how they applied and affected their visit here today. Patient not ready to  quit at this time, however will follow up with their primary doctor when they are.   CPT code: 95284: intermediate counseling for smoking cessation    [SG]  0900 Anion gap(!): 34 [SG]  0900 Glucose(!!): 679 [SG]  0900 CO2(!): 14 [SG]  0900 Potassium: 5.0 [SG]  0900 Start endotool, 2L LR bolus already in process [SG]  0937 Creatinine(!): 1.56 Baseline around 0.8, AKI, likely 2/2 volume loss [SG]  0937 Sodium(!): 124 Corrected sodium is 138 [SG]    Clinical Course User Index [SG] Tanda Rockers A, DO   POC glucose over 500.  Patient insulin-dependent diabetes.  Recent admission w/ DKA Collect screening labs, give IV fluids, antiemetic  Workup consistent with DKA type I, favor 2/2 recent bout of n/v/d and variable compliance with insulin.  Start Endo tool, DKA order panel.  Continue IV fluids, will admit to hospitalist, Dr Maryfrances Bunnell accepting He is alert/awake/conversant             Additional history obtained: -Additional history obtained from na -External records from outside source obtained and reviewed including: Chart review including previous notes, labs, imaging, consultation notes  including prior admission, prior labs and imaging, home medications Sees Dr Katrinka Blazing endocrinology. Admitted 5/24 with DKA.  Echocardiogram 4/24 with LVEF 45 to 50% with G1 DD     Lab Tests: -I ordered, reviewed, and interpreted labs.   The pertinent results include:   Labs Reviewed  CBC WITH DIFFERENTIAL/PLATELET - Abnormal; Notable for the following components:      Result Value   WBC 16.9 (*)    Neutro Abs 12.3 (*)    Monocytes Absolute 1.8 (*)    Abs Immature Granulocytes 0.12 (*)    All other components within normal limits  COMPREHENSIVE METABOLIC PANEL - Abnormal; Notable for the following components:   Sodium 124 (*)    Chloride 76 (*)    CO2 14 (*)    Glucose, Bld 679 (*)    BUN 36 (*)    Creatinine, Ser 1.56 (*)    Total Protein 8.2 (*)    AST 69 (*)    ALT 78 (*)     Alkaline Phosphatase 211 (*)    Total Bilirubin 2.5 (*)    GFR, Estimated 54 (*)    Anion gap 34 (*)    All other components within normal limits  URINALYSIS, ROUTINE W REFLEX MICROSCOPIC - Abnormal; Notable for the following components:   Glucose, UA >=500 (*)    Hgb urine dipstick SMALL (*)    Bilirubin Urine SMALL (*)    Ketones, ur >=80 (*)    Protein, ur 100 (*)    All other components within normal limits  RAPID URINE DRUG SCREEN, HOSP PERFORMED - Abnormal; Notable for the following components:   Tetrahydrocannabinol POSITIVE (*)    All other components within normal limits  URINALYSIS, MICROSCOPIC (REFLEX) - Abnormal; Notable for the following components:   Bacteria, UA MANY (*)    All other components within normal limits  CBG MONITORING, ED - Abnormal; Notable for the following components:   Glucose-Capillary 556 (*)    All other components within normal limits  I-STAT VENOUS BLOOD GAS, ED - Abnormal; Notable for the following components:   pH, Ven 7.217 (*)    pCO2, Ven 37.7 (*)    pO2, Ven 30 (*)    Bicarbonate 15.5 (*)    TCO2 17 (*)    Acid-base deficit 12.0 (*)    Sodium 120 (*)    Calcium, Ion 1.02 (*)    Hemoglobin 17.3 (*)    All other components within normal limits  CBG MONITORING, ED - Abnormal; Notable for the following components:   Glucose-Capillary >600 (*)    All other components within normal limits  LIPASE, BLOOD  BETA-HYDROXYBUTYRIC ACID  OSMOLALITY  BASIC METABOLIC PANEL  BASIC METABOLIC PANEL  BASIC METABOLIC PANEL  BETA-HYDROXYBUTYRIC ACID    Notable for glucose +, AG+, bicarb low, K 5  EKG   EKG Interpretation  Date/Time:  Friday May 21 2023 07:32:54 EDT Ventricular Rate:  117 PR Interval:  131 QRS Duration: 86 QT Interval:  318 QTC Calculation: 444 R Axis:   101 Text Interpretation: Sinus tachycardia Ventricular premature complex Aberrant conduction of SV complex(es) Biatrial enlargement Low voltage with right axis deviation  Abnormal R-wave progression, late transition ST elev, probable normal early repol pattern similar to prior Confirmed by Tanda Rockers ([id number]) on 05/21/2023 7:35:57 AM         Imaging Studies ordered: na   Medicines ordered and prescription drug management: Meds ordered this encounter  Medications   lactated ringers bolus 1,000  mL   ondansetron (ZOFRAN) injection 4 mg   lactated ringers bolus 1,000 mL   insulin regular, human (MYXREDLIN) 100 units/ 100 mL infusion    Order Specific Question:   EndoTool low target:    Answer:   140    Order Specific Question:   EndoTool high target:    Answer:   180    Order Specific Question:   Type of Diabetes    Answer:   Type 1    Order Specific Question:   Mode of Therapy    Answer:   ENDOX1 for DKA    Order Specific Question:   Start Method    Answer:   EndoTool to calculate   lactated ringers infusion   dextrose 5 % in lactated ringers infusion   dextrose 50 % solution 0-50 mL   potassium chloride 10 mEq in 100 mL IVPB    -I have reviewed the patients home medicines and have made adjustments as needed   Consultations Obtained: na   Cardiac Monitoring: The patient was maintained on a cardiac monitor.  I personally viewed and interpreted the cardiac monitored which showed an underlying rhythm of: sinus tachy  Social Determinants of Health:  Diagnosis or treatment significantly limited by social determinants of health: current smoker and polysubstance abuse   Reevaluation: After the interventions noted above, I reevaluated the patient and found that they have improved  Co morbidities that complicate the patient evaluation  Past Medical History:  Diagnosis Date   Asthma    Depression    Diabetes mellitus without complication (HCC)    Schizophrenia (HCC)    Uses self-applied continuous glucose monitoring device       Dispostion: Disposition decision including need for hospitalization was considered, and patient admitted to  the hospital.    Final Clinical Impression(s) / ED Diagnoses Final diagnoses:  Diabetic ketoacidosis without coma associated with type 1 diabetes mellitus (HCC)  Marijuana use  AKI (acute kidney injury) (HCC)     This chart was dictated using voice recognition software.  Despite best efforts to proofread,  errors can occur which can change the documentation meaning.    Sloan Leiter, DO 05/21/23 [id number]

## 2023-05-21 NOTE — Assessment & Plan Note (Addendum)
Creatinine 1.56 on admission from baseline 0.7, more than doubled from baseline.  Due to DKA. - IV fluids

## 2023-05-21 NOTE — Assessment & Plan Note (Signed)
Longstanding problem.  Imaging unremarkable.   - Follow up with PCP

## 2023-05-21 NOTE — Hospital Course (Signed)
Tom Anderson is a 52 y.o. 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  Patient reports he has not been taking his insulins as prescribed in the last few days, for no particular reason.  No other chest pain, dyspnea, fever, urinary symptoms.  Today he developed malaise, nausea, vomiting, so he came to the ER where his sugar was 679, anion gap 34, bicarb 14, and VBG showed pH 7.217.  Started on insulin infusion and IV fluids and admitted to stepdown.

## 2023-05-21 NOTE — ED Notes (Signed)
Called [facility] for Transport to  @9 :28am.    Spoke with [place].

## 2023-05-21 NOTE — Assessment & Plan Note (Addendum)
Follows with [facility].  Has type 2 diabetes.  See above.

## 2023-05-21 NOTE — Assessment & Plan Note (Signed)
No fluid overload.  Not on diuretics.

## 2023-05-21 NOTE — H&P (Signed)
History and Physical    Patient: Tom Anderson FAO:[id number] DOB: 09-27-1971 DOA: 05/21/2023 DOS: the patient was seen and examined on 05/21/2023 PCP: Montez Hageman, DO  Patient coming from: Home  Chief Complaint:  Chief Complaint  Patient presents with   Hyperglycemia       HPI:  [name] is a 52 y.o. 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  Patient reports he has not been taking his insulins as prescribed in the last few days, for no particular reason.  No other chest pain, dyspnea, fever, urinary symptoms.  Today he developed malaise, nausea, vomiting, so he came to the ER where his sugar was 679, anion gap 34, bicarb 14, and VBG showed pH 7.217.  Started on insulin infusion and IV fluids and admitted to stepdown.      Review of Systems  Constitutional:  Positive for malaise/fatigue. Negative for chills, fever and weight loss.  Respiratory:  Negative for cough and shortness of breath.   Cardiovascular:  Negative for chest pain, orthopnea and leg swelling.  Gastrointestinal:  Positive for diarrhea, nausea and vomiting. Negative for abdominal pain and blood in stool.  Genitourinary:  Negative for dysuria, flank pain, frequency, hematuria and urgency.  All other systems reviewed and are negative.    Past Medical History:  Diagnosis Date   Asthma    Depression    Diabetes mellitus without complication (HCC)    Schizophrenia (HCC)    Uses self-applied continuous glucose monitoring device    Past Surgical History:  Procedure Laterality Date   HERNIA REPAIR     Social History: Lives with mother and father.  Smokes and uses THC.  No alcohol.  Allergies  Allergen Reactions   Nitrous Oxide Nausea And Vomiting    Family History  Problem Relation Age of Onset   Diabetes Maternal Grandfather     Prior to Admission medications   Medication Sig Start Date  End Date Taking? Authorizing Provider  ARIPiprazole (ABILIFY) 10 MG tablet Take 1 tablet (10 mg total) by mouth daily. 03/23/23   Rai, Delene Ruffini, MD  aspirin 81 MG chewable tablet Chew 1 tablet (81 mg total) by mouth daily. 03/23/23   Rai, Ripudeep K, MD  feeding supplement, GLUCERNA SHAKE, (GLUCERNA SHAKE) LIQD Take 237 mLs by mouth 2 (two) times daily between meals. 01/15/23   [name], MD  folic acid (FOLVITE) 1 MG tablet Take 1 tablet (1 mg total) by mouth daily. 03/23/23   Rai, Ripudeep K, MD  insulin aspart (NOVOLOG) 100 UNIT/ML FlexPen For glucose 121 to 150 use 1 unit, for 151 to 200 use 2 units, for 201 to 250 use 3 units, for 251 to 300 use 5 units, for 301 to 350 use 7 units for 351 or greater use 9 units. Patient taking differently: Inject 1-18 Units into the skin in the morning, at noon, in the evening, and at bedtime. 10/07/20   [name], [name], MD  insulin detemir (LEVEMIR) 100 UNIT/ML FlexPen Inject 8 Units into the skin at bedtime. 03/23/23   Rai, Delene Ruffini, MD  metoCLOPramide (REGLAN) 5 MG tablet Take 1 tablet (5 mg total) by mouth every 8 (eight) hours as needed for nausea or vomiting. 03/23/23   Rai, Ripudeep K, MD  nicotine (NICODERM CQ - DOSED IN MG/24 HOURS) 21 mg/24hr patch Place 1 patch (21 mg total) onto the skin daily. 05/03/23   Adhikari,  Amrit, MD  pantoprazole (PROTONIX) 40 MG tablet Take 1 tablet (40 mg total) by mouth daily. 03/23/23   Rai, Delene Ruffini, MD  rivaroxaban (XARELTO) 20 MG TABS tablet Take 1 tablet (20 mg total) by mouth daily with supper. Take this when the xarelto starter pack is completed. 04/12/23   Rai, Delene Ruffini, MD  traZODone (DESYREL) 50 MG tablet Take 1 tablet (50 mg total) by mouth at bedtime. 03/24/23   Russella Dar, NP  VENTOLIN HFA 108 (90 Base) MCG/ACT inhaler Inhale 1 puff into the lungs every 4 (four) hours as needed for wheezing. 04/02/23   [provider]    Physical Exam: Vitals:   05/21/23 0926 05/21/23 1000 05/21/23  1044 05/21/23 1100  BP: 116/80 (!) 132/92 (!) 126/91 124/85  Pulse: (!) 117 (!) 126 (!) 124 (!) 110  Resp: (!) 24 20 16 15   Temp: 99.7 F (37.6 C)  97.6 F (36.4 C)   TempSrc: Temporal  Oral   SpO2: 98% 96% 93% 99%  Weight:   49.4 kg   Height:   5\' 9"  (1.753 m)    General appearance: Thin adult male, appears weak and tired, tachycardic and uncomfortable, responds appropriate to questions, dress and hygiene appear chronically poor.  HEENT: Anicteric, conjunctiva pink, lids and lashes normal, edentulous, oral mucosa dry, edentulous, lips normal, normal auditory acuity,   Cor: Tachycardic, regular, no murmurs, no peripheral edema, radial and DP pulses 2+ and symmetric, feet warm Resp: Respiratory rate seems normal, Lungs clear without rales or wheezes Abd: Abdomen soft without tenderness palpation or guarding, no ascites or distention  MSK: Diffuse loss of subcutaneous muscle mass and fat, no gross deformities of the hands, large joints of the arms or legs   Skin: Cap refill normal, skin intact without rashes or lesions other than crusted skin on bilateral feet due to poor hygiene  Neuro: Speech is fluent, he has tardive dyskinesia, naming is grossly intact, and his recall seems relatively normal, mild psychomotor slowing, muscle tone normal, strength 5 -/5 bilaterally in upper and lower extremities  Psych: Attention span and concentration seem normal, affect is blunted by malaise, but his thought content seems normal he has normal rate of speech          Data Reviewed: Basic metabolic panel shows hyperglycemia, metabolic acidosis LFTs mildly elevated but unchanged from baseline CBC shows mild leukocytosis, reactive from DKA Urinalysis with bacteria, no leukocytes or nitrates UDS positive for THC Lipase normal VBG shows normal CO2, but pH 7.2 EKG shows sinus tachycardia, personally reviewed       Assessment and Plan: * DKA (diabetic ketoacidosis) (HCC) Due to noncompliance.   A1c earlier this month >13%.   - Continue on infusion - BMP every 4 hours - IV fluids    Transaminitis Longstanding problem.  Imaging unremarkable.   - Follow up with PCP  Chronic systolic CHF (congestive heart failure) (HCC) No fluid overload.  Not on diuretics.  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 Plan is for 3 months anticoag and follow up with [facility] next month. - Continue Xarelto and aspirin  Malnutrition of moderate degree As evidenced by moderate loss of subcutaneous muscle mass and fat diffusely in the setting of uncontrolled diabetes.  Schizophrenia (HCC) Reports his stable from a mental health standpoint and not on antipsychotics at this time.  Denies hallucination/delusion symptoms.  Uncontrolled type 2 diabetes mellitus with hyperglycemia, with long-term  current use of insulin (HCC) Follows with [facility].  Has type 2 diabetes.  See above.  AKI (acute kidney injury) (HCC) Creatinine 1.56 on admission from baseline 0.7, more than doubled from baseline.  Due to DKA. - IV fluids         Advance Care Planning: Full code, confirmed with patient  Consults: None needed  Family Communication: None present  Severity of Illness: The appropriate patient status for this patient is OBSERVATION. Observation status is judged to be reasonable and necessary in order to provide the required intensity of service to ensure the patient's safety. The patient's presenting symptoms, physical exam findings, and initial radiographic and laboratory data in the context of their medical condition is felt to place them at decreased risk for further clinical deterioration. Furthermore, it is anticipated that the patient will be medically stable for discharge from the hospital within 2 midnights of admission.   Author: Alberteen Sam, MD 05/21/2023 11:27 AM  For on call review [email or web address].

## 2023-05-21 NOTE — Assessment & Plan Note (Signed)
Reports his stable from a mental health standpoint and not on antipsychotics at this time.  Denies hallucination/delusion symptoms.

## 2023-05-21 NOTE — Assessment & Plan Note (Signed)
Diagnosed in April, follows with [facility]. Plan is for 3 months anticoag and follow up with [facility] next month. - Continue Xarelto and aspirin

## 2023-05-21 NOTE — Assessment & Plan Note (Signed)
Due to noncompliance.  A1c earlier this month >13%.   - Continue on infusion - BMP every 4 hours - IV fluids

## 2023-05-21 NOTE — ED Triage Notes (Signed)
States his sugars have been elevated. Current glucose is 556. Complaints of nausea, hemoptysis and weakness.

## 2023-05-21 NOTE — Assessment & Plan Note (Signed)
As evidenced by moderate loss of subcutaneous muscle mass and fat diffusely in the setting of uncontrolled diabetes.

## 2023-05-22 DIAGNOSIS — E1010 Type 1 diabetes mellitus with ketoacidosis without coma: Secondary | ICD-10-CM

## 2023-05-22 DIAGNOSIS — N179 Acute kidney failure, unspecified: Secondary | ICD-10-CM

## 2023-05-22 DIAGNOSIS — I8290 Acute embolism and thrombosis of unspecified vein: Secondary | ICD-10-CM

## 2023-05-22 LAB — GLUCOSE, CAPILLARY
Glucose-Capillary: 108 mg/dL — ABNORMAL HIGH (ref 70–99)
Glucose-Capillary: 124 mg/dL — ABNORMAL HIGH (ref 70–99)
Glucose-Capillary: 125 mg/dL — ABNORMAL HIGH (ref 70–99)
Glucose-Capillary: 128 mg/dL — ABNORMAL HIGH (ref 70–99)
Glucose-Capillary: 138 mg/dL — ABNORMAL HIGH (ref 70–99)
Glucose-Capillary: 140 mg/dL — ABNORMAL HIGH (ref 70–99)
Glucose-Capillary: 157 mg/dL — ABNORMAL HIGH (ref 70–99)
Glucose-Capillary: 160 mg/dL — ABNORMAL HIGH (ref 70–99)
Glucose-Capillary: 163 mg/dL — ABNORMAL HIGH (ref 70–99)
Glucose-Capillary: 169 mg/dL — ABNORMAL HIGH (ref 70–99)
Glucose-Capillary: 170 mg/dL — ABNORMAL HIGH (ref 70–99)
Glucose-Capillary: 211 mg/dL — ABNORMAL HIGH (ref 70–99)
Glucose-Capillary: 59 mg/dL — ABNORMAL LOW (ref 70–99)

## 2023-05-22 LAB — BASIC METABOLIC PANEL
Anion gap: 9 (ref 5–15)
Anion gap: 9 (ref 5–15)
Anion gap: 11 (ref 5–15)
BUN: 16 mg/dL (ref 6–20)
BUN: 16 mg/dL (ref 6–20)
BUN: 12 mg/dL (ref 6–20)
CO2: 27 mmol/L (ref 22–32)
CO2: 29 mmol/L (ref 22–32)
CO2: 25 mmol/L (ref 22–32)
Calcium: 8.1 mg/dL — ABNORMAL LOW (ref 8.9–10.3)
Calcium: 8.3 mg/dL — ABNORMAL LOW (ref 8.9–10.3)
Calcium: 8.1 mg/dL — ABNORMAL LOW (ref 8.9–10.3)
Chloride: 93 mmol/L — ABNORMAL LOW (ref 98–111)
Chloride: 93 mmol/L — ABNORMAL LOW (ref 98–111)
Chloride: 94 mmol/L — ABNORMAL LOW (ref 98–111)
Creatinine, Ser: 0.74 mg/dL (ref 0.61–1.24)
Creatinine, Ser: 0.86 mg/dL (ref 0.61–1.24)
Creatinine, Ser: 0.67 mg/dL (ref 0.61–1.24)
GFR, Estimated: >60 mL/min (ref >60)
GFR, Estimated: >60 mL/min (ref >60)
GFR, Estimated: >60 mL/min (ref >60)
Glucose, Bld: 141 mg/dL — ABNORMAL HIGH (ref 70–99)
Glucose, Bld: 180 mg/dL — ABNORMAL HIGH (ref 70–99)
Glucose, Bld: 105 mg/dL — ABNORMAL HIGH (ref 70–99)
Potassium: 3.4 mmol/L — ABNORMAL LOW (ref 3.5–5.1)
Potassium: 3.6 mmol/L (ref 3.5–5.1)
Potassium: 3.5 mmol/L (ref 3.5–5.1)
Sodium: 129 mmol/L — ABNORMAL LOW (ref 135–145)
Sodium: 131 mmol/L — ABNORMAL LOW (ref 135–145)
Sodium: 130 mmol/L — ABNORMAL LOW (ref 135–145)

## 2023-05-22 LAB — CBC
HCT: 33.8 % — ABNORMAL LOW (ref 39.0–52.0)
Hemoglobin: 11.5 g/dL — ABNORMAL LOW (ref 13.0–17.0)
MCH: 33 pg (ref 26.0–34.0)
MCHC: 34 g/dL (ref 30.0–36.0)
MCV: 97.1 fL (ref 80.0–100.0)
Platelets: 331 10*3/uL (ref 150–400)
RBC: 3.48 MIL/uL — ABNORMAL LOW (ref 4.22–5.81)
RDW: 11.9 % (ref 11.5–15.5)
WBC: 11.5 10*3/uL — ABNORMAL HIGH (ref 4.0–10.5)
nRBC: 0 % (ref 0.0–0.2)

## 2023-05-22 LAB — BETA-HYDROXYBUTYRIC ACID
Beta-Hydroxybutyric Acid: 0.36 mmol/L — ABNORMAL HIGH (ref 0.05–0.27)
Beta-Hydroxybutyric Acid: 0.19 mmol/L (ref 0.05–0.27)

## 2023-05-22 MED ORDER — SODIUM CHLORIDE 0.9% FLUSH
3.0000 mL | Freq: Two times a day (BID) | INTRAVENOUS | Status: DC
  Administered 2023-05-22: 3 mL via INTRAVENOUS

## 2023-05-22 MED ORDER — INSULIN ASPART 100 UNIT/ML IJ SOLN
0.0000 [IU] | Freq: Three times a day (TID) | INTRAMUSCULAR | Status: DC
  Administered 2023-05-22: 3 [IU] via SUBCUTANEOUS

## 2023-05-22 MED ORDER — ACETAMINOPHEN 325 MG PO TABS: 650.0000 mg | ORAL_TABLET | Freq: Four times a day (QID) | ORAL | Status: AC | PRN

## 2023-05-22 MED ORDER — SODIUM CHLORIDE 0.9% FLUSH: 3.0000 mL | INTRAVENOUS | Status: DC | PRN

## 2023-05-22 MED ORDER — SODIUM CHLORIDE 0.9 % IV SOLN: 250.0000 mL | INTRAVENOUS | Status: DC | PRN

## 2023-05-22 MED ORDER — VENTOLIN HFA 108 (90 BASE) MCG/ACT IN AERS: 2.0000 | INHALATION_SPRAY | RESPIRATORY_TRACT | 0 refills | Status: DC | PRN

## 2023-05-22 MED ORDER — INSULIN DETEMIR 100 UNIT/ML ~~LOC~~ SOLN
5.0000 [IU] | Freq: Once | SUBCUTANEOUS | Status: AC
  Administered 2023-05-22: 5 [IU] via SUBCUTANEOUS
  Filled 2023-05-22: qty 0.05

## 2023-05-22 MED ORDER — INSULIN ASPART 100 UNIT/ML IJ SOLN: 0.0000 [IU] | Freq: Every day | INTRAMUSCULAR | Status: DC

## 2023-05-22 NOTE — Plan of Care (Signed)
  Problem: Education: Goal: Ability to describe self-care measures that may prevent or decrease complications (Diabetes Survival Skills Education) will improve Outcome: Progressing   Problem: Cardiac: Goal: Ability to maintain an adequate cardiac output will improve Outcome: Progressing   Problem: Health Behavior/Discharge Planning: Goal: Ability to identify and utilize available resources and services will improve Outcome: Progressing Goal: Ability to manage health-related needs will improve Outcome: Progressing   Problem: Fluid Volume: Goal: Ability to achieve a balanced intake and output will improve Outcome: Progressing   Problem: Metabolic: Goal: Ability to maintain appropriate glucose levels will improve Outcome: Progressing   Problem: Nutritional: Goal: Maintenance of adequate nutrition will improve Outcome: Progressing Goal: Maintenance of adequate weight for body size and type will improve Outcome: Progressing   Problem: Respiratory: Goal: Will regain and/or maintain adequate ventilation Outcome: Progressing   Problem: Urinary Elimination: Goal: Ability to achieve and maintain adequate renal perfusion and functioning will improve Outcome: Progressing   Problem: Education: Goal: Knowledge of General Education information will improve Description: Including pain rating scale, medication(s)/side effects and non-pharmacologic comfort measures Outcome: Progressing   Problem: Health Behavior/Discharge Planning: Goal: Ability to manage health-related needs will improve Outcome: Progressing   Problem: Clinical Measurements: Goal: Ability to maintain clinical measurements within normal limits will improve Outcome: Progressing Goal: Will remain free from infection Outcome: Progressing Goal: Diagnostic test results will improve Outcome: Progressing Goal: Respiratory complications will improve Outcome: Progressing Goal: Cardiovascular complication will be  avoided Outcome: Progressing   Problem: Activity: Goal: Risk for activity intolerance will decrease Outcome: Progressing   Problem: Nutrition: Goal: Adequate nutrition will be maintained Outcome: Progressing   Problem: Coping: Goal: Level of anxiety will decrease Outcome: Progressing   Problem: Elimination: Goal: Will not experience complications related to bowel motility Outcome: Progressing Goal: Will not experience complications related to urinary retention Outcome: Progressing   Problem: Pain Managment: Goal: General experience of comfort will improve Outcome: Progressing   Problem: Safety: Goal: Ability to remain free from injury will improve Outcome: Progressing   Problem: Skin Integrity: Goal: Risk for impaired skin integrity will decrease Outcome: Progressing   Problem: Education: Goal: Knowledge of General Education information will improve Description: Including pain rating scale, medication(s)/side effects and non-pharmacologic comfort measures Outcome: Progressing   Problem: Health Behavior/Discharge Planning: Goal: Ability to manage health-related needs will improve Outcome: Progressing   Problem: Clinical Measurements: Goal: Ability to maintain clinical measurements within normal limits will improve Outcome: Progressing Goal: Will remain free from infection Outcome: Progressing Goal: Diagnostic test results will improve Outcome: Progressing Goal: Respiratory complications will improve Outcome: Progressing Goal: Cardiovascular complication will be avoided Outcome: Progressing   Problem: Activity: Goal: Risk for activity intolerance will decrease Outcome: Progressing   Problem: Nutrition: Goal: Adequate nutrition will be maintained Outcome: Progressing   Problem: Coping: Goal: Level of anxiety will decrease Outcome: Progressing   Problem: Elimination: Goal: Will not experience complications related to bowel motility Outcome:  Progressing Goal: Will not experience complications related to urinary retention Outcome: Progressing   Problem: Pain Managment: Goal: General experience of comfort will improve Outcome: Progressing   Problem: Safety: Goal: Ability to remain free from injury will improve Outcome: Progressing   Problem: Skin Integrity: Goal: Risk for impaired skin integrity will decrease Outcome: Progressing   Cindy S. Clelia Croft BSN, RN, [employer], CCRN 05/22/2023 6:57 AM

## 2023-05-22 NOTE — Discharge Summary (Addendum)
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  Patient reports he has not been taking his insulins as prescribed in the last few days, for no particular reason.  No other chest pain, dyspnea, fever, urinary symptoms.  Today he developed malaise, nausea, vomiting, so he came to the ER where his sugar was 679, anion gap 34, bicarb 14, and VBG showed pH 7.217.  Started on insulin infusion and IV fluids and admitted to stepdown.  Assessment and Plan:  * DKA (diabetic ketoacidosis) (HCC) Due to noncompliance.  A1c earlier this month >13%.   -DKA has resolved, anion gap is 11 -Blood sugar well-controlled   Transaminitis Longstanding problem.  Imaging unremarkable.   - Follow up with PCP  Chronic systolic CHF (congestive heart failure) (HCC) No fluid overload.  Not on diuretics.  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 Plan is for 3 months anticoag and follow up with [facility] next  month. - Continue Xarelto and aspirin  Malnutrition of moderate degree As evidenced by moderate loss of subcutaneous muscle mass and fat diffusely in the setting of uncontrolled diabetes.  Schizophrenia (HCC) Reports his stable from a mental health standpoint and not on antipsychotics at this time.  Denies hallucination/delusion symptoms.  Uncontrolled type 2 diabetes mellitus with hyperglycemia, with long-term current use of insulin (HCC) Follows with [facility].  Has type 2 diabetes.   -Continue home regimen  AKI (acute kidney injury) (HCC) Creatinine 1.56 on admission from baseline 0.7, more than doubled from baseline.  Due to DKA. - Improved with IV fluids, creatinine down to 0.67         Consultants:  Procedures performed:   Disposition: Home Diet recommendation:  Discharge Diet Orders (From admission, onward)     Start     Ordered   05/22/23 0000  Diet - low sodium heart healthy        05/22/23 1347           Regular diet DISCHARGE MEDICATION: Allergies as of 05/22/2023       Reactions   Nitrous Oxide Nausea And Vomiting   Latex Rash        Medication List     STOP taking these medications    Advil 200 MG tablet Generic drug: ibuprofen       TAKE these medications    acetaminophen 325 MG tablet Commonly known as: TYLENOL Take 2 tablets (650 mg total) by mouth every 6 (six) hours as needed for mild pain (or Fever >/= 101).   ARIPiprazole 10  MG tablet Commonly known as: ABILIFY Take 1 tablet (10 mg total) by mouth daily. What changed: when to take this   aspirin 81 MG chewable tablet Chew 1 tablet (81 mg total) by mouth daily.   Dexcom G6 Sensor Misc Inject 1 Device into the skin See admin instructions. Place 1 new sensor into the skin every 10 days   feeding supplement (GLUCERNA SHAKE) Liqd Take 237 mLs by mouth 2 (two) times daily between meals.   folic acid 1 MG tablet Commonly known as: FOLVITE Take 1 tablet (1 mg total) by  mouth daily.   insulin aspart 100 UNIT/ML FlexPen Commonly known as: NOVOLOG For glucose 121 to 150 use 1 unit, for 151 to 200 use 2 units, for 201 to 250 use 3 units, for 251 to 300 use 5 units, for 301 to 350 use 7 units for 351 or greater use 9 units. What changed: Another medication with the same name was removed. Continue taking this medication, and follow the directions you see here.   insulin detemir 100 UNIT/ML FlexPen Commonly known as: LEVEMIR Inject 8 Units into the skin at bedtime.   metoCLOPramide 5 MG tablet Commonly known as: Reglan Take 1 tablet (5 mg total) by mouth every 8 (eight) hours as needed for nausea or vomiting.   nicotine 21 mg/24hr patch Commonly known as: NICODERM CQ - dosed in mg/24 hours Place 1 patch (21 mg total) onto the skin daily.   pantoprazole 40 MG tablet Commonly known as: PROTONIX Take 1 tablet (40 mg total) by mouth daily. What changed: when to take this   rivaroxaban 20 MG Tabs tablet Commonly known as: XARELTO Take 1 tablet (20 mg total) by mouth daily with supper. Take this when the xarelto starter pack is completed. What changed: Another medication with the same name was removed. Continue taking this medication, and follow the directions you see here.   traZODone 50 MG tablet Commonly known as: DESYREL Take 1 tablet (50 mg total) by mouth at bedtime.   Ventolin HFA 108 (90 Base) MCG/ACT inhaler Generic drug: albuterol Inhale 2 puffs into the lungs every 4 (four) hours as needed for wheezing or shortness of breath.        Follow-up Information     Montez Hageman, DO Follow up in 1 week(s).   Specialty: Family Medicine Contact information: [phone] [place] [address] [phone]                Discharge Exam: [name] Weights   05/21/23 0725 05/21/23 1044  Weight: 63.5 kg 49.4 kg   General-appears in no acute distress Heart-S1-S2, regular, no murmur auscultated Lungs-clear to auscultation  bilaterally, no wheezing or crackles auscultated Abdomen-soft, nontender, no organomegaly Extremities-no edema in the lower extremities Neuro-alert, oriented x3, no focal deficit noted  Condition at discharge: good  The results of significant diagnostics from this hospitalization (including imaging, microbiology, ancillary and laboratory) are listed below for reference.   Imaging Studies: CT Head Wo Contrast  Result Date: 04/30/2023 CLINICAL DATA:  52 year old male with altered mental status, has not taking insulin for 2 days, suspected DKA. EXAM: CT HEAD WITHOUT CONTRAST TECHNIQUE: Contiguous axial images were obtained from the base of the skull through the vertex without intravenous contrast. RADIATION DOSE REDUCTION: This exam was performed according to the departmental dose-optimization program which includes automated exposure control, adjustment of the mA and/or kV according to patient size and/or use of iterative reconstruction technique. COMPARISON:  Head CT 03/25/2023.  FINDINGS: Brain: Small chronic right caudate lacunar infarct appears unchanged. Normal background brain volume. No midline shift, ventriculomegaly, mass effect, evidence of mass lesion, intracranial hemorrhage or evidence of cortically based acute infarction. Elsewhere stable, normal gray-white differentiation. Vascular: No suspicious intracranial vascular hyperdensity. Skull: No acute osseous abnormality identified. Sinuses/Orbits: Visualized paranasal sinuses and mastoids are clear. Other: Visualized orbits and scalp soft tissues are within normal limits. IMPRESSION: 1. No acute intracranial abnormality. 2. Small chronic right caudate lacunar infarct. Electronically Signed   By: Odessa Fleming M.D.   On: 04/30/2023 11:32   DG Chest Portable 1 View  Result Date: 04/30/2023 CLINICAL DATA:  Shortness of breath. EXAM: PORTABLE CHEST 1 VIEW COMPARISON:  X-ray 03/19/2023.  CT angiogram 03/21/2023 FINDINGS: Healing left-sided rib fracture  once again identified. No consolidation, edema or effusion. Normal cardiopericardial silhouette. Overlapping cardiac leads. Along both sides there is a change in density peripherally in the lungs. This being related to overlapping structures rather than small pneumothorax. IMPRESSION: Overlapping asymmetric density bilaterally. Legrand Rams this being overlapping structure rather than subtle pneumothorax. Recommend short follow-up or cross-sectional imaging as clinically appropriate for concern. Stable left-sided rib fracture. Electronically Signed   By: Karen Kays M.D.   On: 04/30/2023 11:14    Microbiology: Results for orders placed or performed during the hospital encounter of 05/21/23  MRSA Next Gen by PCR, Nasal     Status: None   Collection Time: 05/21/23 10:42 AM   Specimen: Nasal Mucosa; Nasal Swab  Result Value Ref Range Status   MRSA by PCR Next Gen NOT DETECTED NOT DETECTED Final    Comment: (NOTE) The GeneXpert MRSA Assay (FDA approved for NASAL specimens only), is one component of a comprehensive MRSA colonization surveillance program. It is not intended to diagnose MRSA infection nor to guide or monitor treatment for MRSA infections. Test performance is not FDA approved in patients less than 7 years old. Performed at [facility], [address] [place], [place] [address]     Labs: CBC: Recent Labs  Lab 05/21/23 0731 05/21/23 0747 05/22/23 0252  WBC 16.9*  --  11.5*  NEUTROABS 12.3*  --   --   HGB 14.7 17.3* 11.5*  HCT 43.0 51.0 33.8*  MCV 97.3  --  97.1  PLT 383  --  331   Basic Metabolic Panel: Recent Labs  Lab 05/21/23 1558 05/21/23 2023 05/22/23 0103 05/22/23 0252 05/22/23 0831  NA 127* 127* 129* 131* 130*  K 4.1 3.7 3.4* 3.6 3.5  CL 92* 92* 93* 93* 94*  CO2 25 25 27 29 25   GLUCOSE 141* 152* 141* 180* 105*  BUN 26* 21* 16 16 12   CREATININE 0.88 0.81 0.74 0.86 0.67  CALCIUM 8.4* 8.2* 8.1* 8.3* 8.1*   Liver Function Tests: Recent  Labs  Lab 05/21/23 0731  AST 69*  ALT 78*  ALKPHOS 211*  BILITOT 2.5*  PROT 8.2*  ALBUMIN 4.8   CBG: Recent Labs  Lab 05/22/23 0521 05/22/23 0534 05/22/23 0744 05/22/23 0803 05/22/23 1151  GLUCAP 128* 125* 59* 108* 211*    Discharge time spent: greater than 30 minutes.  Signed: Meredeth Ide, MD [facility] 05/22/2023

## 2023-05-22 NOTE — Progress Notes (Signed)
Patient along with patient's parents understood discharge instructions including changes in regards to medications. Patient's IVs were removed. Patient took all belongings at bedside.

## 2023-05-22 NOTE — Progress Notes (Signed)
Patient's CBG on morning rounds was 59. Patient alert and oriented. Gave patient 2 cups of orange juice. Repeat CBG 108. Notified MD Lama in regards to hypoglycemia. Holding morning dose of insulin. Will continue to monitor and assess. Patient has CBG ACHS orders.

## 2023-05-25 ENCOUNTER — Ambulatory Visit: Payer: Medicaid Other | Admitting: Vascular Surgery

## 2023-06-14 ENCOUNTER — Emergency Department (HOSPITAL_BASED_OUTPATIENT_CLINIC_OR_DEPARTMENT_OTHER)
Admission: EM | Admit: 2023-06-14 | Discharge: 2023-06-14 | Disposition: A | Discharge status: Home / Self Care | Payer: MEDICAID | Attending: Emergency Medicine | Admitting: Emergency Medicine

## 2023-06-14 ENCOUNTER — Emergency Department (HOSPITAL_BASED_OUTPATIENT_CLINIC_OR_DEPARTMENT_OTHER): Payer: MEDICAID

## 2023-06-14 ENCOUNTER — Encounter (HOSPITAL_BASED_OUTPATIENT_CLINIC_OR_DEPARTMENT_OTHER): Payer: Self-pay

## 2023-06-14 DIAGNOSIS — R739 Hyperglycemia, unspecified: Secondary | ICD-10-CM | POA: Insufficient documentation

## 2023-06-14 DIAGNOSIS — Z7982 Long term (current) use of aspirin: Secondary | ICD-10-CM | POA: Insufficient documentation

## 2023-06-14 DIAGNOSIS — R112 Nausea with vomiting, unspecified: Secondary | ICD-10-CM | POA: Insufficient documentation

## 2023-06-14 DIAGNOSIS — Z91040 Latex allergy status: Secondary | ICD-10-CM | POA: Insufficient documentation

## 2023-06-14 DIAGNOSIS — R1010 Upper abdominal pain, unspecified: Secondary | ICD-10-CM | POA: Diagnosis not present

## 2023-06-14 LAB — I-STAT VENOUS BLOOD GAS, ED
Acid-base deficit: 4 mmol/L — ABNORMAL HIGH (ref 0.0–2.0)
Bicarbonate: 20.5 mmol/L (ref 20.0–28.0)
Calcium, Ion: 1.15 mmol/L (ref 1.15–1.40)
HCT: 48 % (ref 39.0–52.0)
Hemoglobin: 16.3 g/dL (ref 13.0–17.0)
O2 Saturation: 73 %
Patient temperature: 97.7
Potassium: 3.7 mmol/L (ref 3.5–5.1)
Sodium: 129 mmol/L — ABNORMAL LOW (ref 135–145)
TCO2: 22 mmol/L (ref 22–32)
pCO2, Ven: 35.7 mmHg — ABNORMAL LOW (ref 44–60)
pH, Ven: 7.363 (ref 7.25–7.43)
pO2, Ven: 39 mmHg (ref 32–45)

## 2023-06-14 LAB — LIPASE, BLOOD: Lipase: 31 U/L (ref 11–51)

## 2023-06-14 LAB — URINALYSIS, MICROSCOPIC (REFLEX)

## 2023-06-14 LAB — CBC WITH DIFFERENTIAL/PLATELET
Abs Immature Granulocytes: 0.21 10*3/uL — ABNORMAL HIGH (ref 0.00–0.07)
Basophils Absolute: 0.1 10*3/uL (ref 0.0–0.1)
Basophils Relative: 1 %
Eosinophils Absolute: 0.1 10*3/uL (ref 0.0–0.5)
Eosinophils Relative: 1 %
HCT: 39.5 % (ref 39.0–52.0)
Hemoglobin: 14 g/dL (ref 13.0–17.0)
Immature Granulocytes: 2 %
Lymphocytes Relative: 39 %
Lymphs Abs: 4.6 10*3/uL — ABNORMAL HIGH (ref 0.7–4.0)
MCH: 33.4 pg (ref 26.0–34.0)
MCHC: 35.4 g/dL (ref 30.0–36.0)
MCV: 94.3 fL (ref 80.0–100.0)
Monocytes Absolute: 1 10*3/uL (ref 0.1–1.0)
Monocytes Relative: 9 %
Neutro Abs: 5.9 10*3/uL (ref 1.7–7.7)
Neutrophils Relative %: 48 %
Platelets: 482 10*3/uL — ABNORMAL HIGH (ref 150–400)
RBC: 4.19 MIL/uL — ABNORMAL LOW (ref 4.22–5.81)
RDW: 12.6 % (ref 11.5–15.5)
WBC: 12 10*3/uL — ABNORMAL HIGH (ref 4.0–10.5)
nRBC: 0 % (ref 0.0–0.2)

## 2023-06-14 LAB — ETHANOL: Alcohol, Ethyl (B): <10 mg/dL (ref <10)

## 2023-06-14 LAB — URINALYSIS, ROUTINE W REFLEX MICROSCOPIC
Bilirubin Urine: NEGATIVE
Glucose, UA: NEGATIVE mg/dL
Ketones, ur: 15 mg/dL — AB
Leukocytes,Ua: NEGATIVE
Nitrite: NEGATIVE
Protein, ur: 100 mg/dL — AB
Specific Gravity, Urine: 1.01 (ref 1.005–1.030)
pH: 5.5 (ref 5.0–8.0)

## 2023-06-14 LAB — COMPREHENSIVE METABOLIC PANEL
ALT: 101 U/L — ABNORMAL HIGH (ref 0–44)
AST: 79 U/L — ABNORMAL HIGH (ref 15–41)
Albumin: 4.7 g/dL (ref 3.5–5.0)
Alkaline Phosphatase: 243 U/L — ABNORMAL HIGH (ref 38–126)
Anion gap: 17 — ABNORMAL HIGH (ref 5–15)
BUN: 25 mg/dL — ABNORMAL HIGH (ref 6–20)
CO2: 19 mmol/L — ABNORMAL LOW (ref 22–32)
Calcium: 9.5 mg/dL (ref 8.9–10.3)
Chloride: 93 mmol/L — ABNORMAL LOW (ref 98–111)
Creatinine, Ser: 1.1 mg/dL (ref 0.61–1.24)
GFR, Estimated: >60 mL/min (ref >60)
Glucose, Bld: 193 mg/dL — ABNORMAL HIGH (ref 70–99)
Potassium: 3.5 mmol/L (ref 3.5–5.1)
Sodium: 129 mmol/L — ABNORMAL LOW (ref 135–145)
Total Bilirubin: 1.2 mg/dL (ref 0.3–1.2)
Total Protein: 7.9 g/dL (ref 6.5–8.1)

## 2023-06-14 LAB — BETA-HYDROXYBUTYRIC ACID: Beta-Hydroxybutyric Acid: 2.36 mmol/L — ABNORMAL HIGH (ref 0.05–0.27)

## 2023-06-14 LAB — CBG MONITORING, ED
Glucose-Capillary: 223 mg/dL — ABNORMAL HIGH (ref 70–99)
Glucose-Capillary: 91 mg/dL (ref 70–99)

## 2023-06-14 LAB — LACTIC ACID, PLASMA
Lactic Acid, Venous: 3.3 mmol/L (ref 0.5–1.9)
Lactic Acid, Venous: 1.3 mmol/L (ref 0.5–1.9)

## 2023-06-14 LAB — TROPONIN I (HIGH SENSITIVITY): Troponin I (High Sensitivity): 3 ng/L (ref <18)

## 2023-06-14 MED ORDER — IOHEXOL 300 MG/ML  SOLN
100.0000 mL | Freq: Once | INTRAMUSCULAR | Status: AC | PRN
  Administered 2023-06-14: 100 mL via INTRAVENOUS

## 2023-06-14 MED ORDER — SODIUM CHLORIDE 0.9 % IV BOLUS
1000.0000 mL | Freq: Once | INTRAVENOUS | Status: AC
  Administered 2023-06-14: 1000 mL via INTRAVENOUS

## 2023-06-14 MED ORDER — ONDANSETRON HCL 4 MG/2ML IJ SOLN
4.0000 mg | Freq: Once | INTRAMUSCULAR | Status: AC
  Administered 2023-06-14: 4 mg via INTRAVENOUS
  Filled 2023-06-14: qty 2

## 2023-06-14 NOTE — ED Notes (Signed)
Discharge paperwork reviewed entirely with patient, including follow up care. Pain was under control. The patient received instruction and coaching on their prescriptions, and all follow-up questions were answered.  Pt verbalized understanding as well as all parties involved. No questions or concerns voiced at the time of discharge. No acute distress noted.   Pt ambulated out to PVA without incident or assistance.  

## 2023-06-14 NOTE — ED Provider Notes (Signed)
[place] [facility] Provider Note   CSN: [id number] Arrival date & time: 06/14/23  8295     History {Add pertinent medical, surgical, social history, OB history to HPI:1} Chief Complaint  Patient presents with   Hyperglycemia    Tom Anderson is a 52 y.o. male.  52 yo M with a chief complaints of upper abdominal discomfort and nausea.  He said he ate late last night and had an episode of emesis and does not feel well today.  He feels like when he is typically in diabetic ketoacidosis but he thinks it feels worse than normal.  He denies fevers denies diarrhea denies cough or congestion denies chest pain or pressure.   Hyperglycemia      Home Medications Prior to Admission medications   Medication Sig Start Date End Date Taking? Authorizing Provider  acetaminophen (TYLENOL) 325 MG tablet Take 2 tablets (650 mg total) by mouth every 6 (six) hours as needed for mild pain (or Fever >/= 101). 05/22/23   Meredeth Ide, MD  ARIPiprazole (ABILIFY) 10 MG tablet Take 1 tablet (10 mg total) by mouth daily. Patient taking differently: Take 10 mg by mouth 3 (three) times a week. 03/23/23   Rai, Delene Ruffini, MD  aspirin 81 MG chewable tablet Chew 1 tablet (81 mg total) by mouth daily. Patient not taking: Reported on 05/21/2023 03/23/23   Rai, Delene Ruffini, MD  Continuous Glucose Sensor (DEXCOM G6 SENSOR) MISC Inject 1 Device into the skin See admin instructions. Place 1 new sensor into the skin every 10 days 05/15/23   [provider]  feeding supplement, GLUCERNA SHAKE, (GLUCERNA SHAKE) LIQD Take 237 mLs by mouth 2 (two) times daily between meals. 01/15/23   [name], MD  folic acid (FOLVITE) 1 MG tablet Take 1 tablet (1 mg total) by mouth daily. 03/23/23   Rai, Ripudeep K, MD  insulin aspart (NOVOLOG) 100 UNIT/ML FlexPen For glucose 121 to 150 use 1 unit, for 151 to 200 use 2 units, for 201 to 250 use 3 units, for 251 to 300 use 5 units, for 301 to  350 use 7 units for 351 or greater use 9 units. Patient not taking: Reported on 05/21/2023 10/07/20   [name], [name], MD  insulin detemir (LEVEMIR) 100 UNIT/ML FlexPen Inject 8 Units into the skin at bedtime. 03/23/23   Rai, Delene Ruffini, MD  metoCLOPramide (REGLAN) 5 MG tablet Take 1 tablet (5 mg total) by mouth every 8 (eight) hours as needed for nausea or vomiting. Patient not taking: Reported on 05/21/2023 03/23/23   Rai, Delene Ruffini, MD  nicotine (NICODERM CQ - DOSED IN MG/24 HOURS) 21 mg/24hr patch Place 1 patch (21 mg total) onto the skin daily. Patient not taking: Reported on 05/21/2023 05/03/23   Burnadette Pop, MD  pantoprazole (PROTONIX) 40 MG tablet Take 1 tablet (40 mg total) by mouth daily. Patient taking differently: Take 40 mg by mouth daily before lunch. 03/23/23   Rai, Delene Ruffini, MD  rivaroxaban (XARELTO) 20 MG TABS tablet Take 1 tablet (20 mg total) by mouth daily with supper. Take this when the xarelto starter pack is completed. Patient not taking: Reported on 05/21/2023 04/12/23   Rai, Delene Ruffini, MD  traZODone (DESYREL) 50 MG tablet Take 1 tablet (50 mg total) by mouth at bedtime. Patient not taking: Reported on 05/21/2023 03/24/23   Russella Dar, NP  VENTOLIN HFA 108 (90 Base) MCG/ACT inhaler Inhale 2 puffs into the lungs  every 4 (four) hours as needed for wheezing or shortness of breath. 05/22/23   Meredeth Ide, MD      Allergies    Nitrous oxide and Latex    Review of Systems   Review of Systems  Physical Exam Updated Vital Signs BP (!) 123/95 (BP Location: Right Arm)   Pulse (!) 110   Temp 97.7 F (36.5 C) (Oral)   Resp 20   Ht 5\' 9"  (1.753 m)   Wt 63.5 kg   SpO2 93%   BMI 20.67 kg/m  Physical Exam Vitals and nursing note reviewed.  Constitutional:      Appearance: He is well-developed.  HENT:     Head: Normocephalic and atraumatic.  Eyes:     Pupils: Pupils are equal, round, and reactive to light.  Neck:     Vascular: No JVD.  Cardiovascular:      Rate and Rhythm: Normal rate and regular rhythm.     Heart sounds: No murmur heard.    No friction rub. No gallop.  Pulmonary:     Effort: No respiratory distress.     Breath sounds: No wheezing.  Abdominal:     General: There is no distension.     Tenderness: There is abdominal tenderness. There is no guarding or rebound.     Comments: Diffuse abdominal discomfort worse to the upper portion of the abdomen.  Negative Murphy sign.  Musculoskeletal:        General: Normal range of motion.     Cervical back: Normal range of motion and neck supple.  Skin:    Coloration: Skin is not pale.     Findings: No rash.  Neurological:     Mental Status: He is alert and oriented to person, place, and time.  Psychiatric:        Behavior: Behavior normal.     ED Results / Procedures / Treatments   Labs (all labs ordered are listed, but only abnormal results are displayed) Labs Reviewed  CBG MONITORING, ED - Abnormal; Notable for the following components:      Result Value   Glucose-Capillary 223 (*)    All other components within normal limits  CBC WITH DIFFERENTIAL/PLATELET  COMPREHENSIVE METABOLIC PANEL  LIPASE, BLOOD  BLOOD GAS, VENOUS  BETA-HYDROXYBUTYRIC ACID  TROPONIN I (HIGH SENSITIVITY)    EKG None  Radiology No results found.  Procedures Procedures  {Document cardiac monitor, telemetry assessment procedure when appropriate:1}  Medications Ordered in ED Medications  sodium chloride 0.9 % bolus 1,000 mL (has no administration in time range)  ondansetron (ZOFRAN) injection 4 mg (has no administration in time range)    ED Course/ Medical Decision Making/ A&P   {   Click here for ABCD2, HEART and other calculatorsREFRESH Note before signing :1}                          Medical Decision Making Amount and/or Complexity of Data Reviewed Labs: ordered. Radiology: ordered. ECG/medicine tests: ordered.  Risk Prescription drug management.   52 yo M with a chief  complaints of upper abdominal pain nausea and vomiting.  He thinks this is similar to when he had diabetic ketoacidosis in the past though is not significantly hyperglycemic here.  Will obtain a laboratory evaluation and treat pain and nausea IV fluids reassess.  Patient without obvious diabetic ketoacidosis.  He does have a metabolic acidosis with anion gap but blood sugar is right around 200.  Will send off a lactate and ethanol level.  With him having some focal right upper quadrant abdominal pain and new elevated LFTs will obtain right upper quadrant ultrasound.  Lactate came back elevated.  Ultrasound without obvious acute cholecystitis.  Will obtain a CT of the abdomen pelvis.  Lactic acid cleared with IV fluids.  {Document critical care time when appropriate:1} {Document review of labs and clinical decision tools ie heart score, Chads2Vasc2 etc:1}  {Document your independent review of radiology images, and any outside records:1} {Document your discussion with family members, caretakers, and with consultants:1} {Document social determinants of health affecting pt's care:1} {Document your decision making why or why not admission, treatments were needed:1} Final Clinical Impression(s) / ED Diagnoses Final diagnoses:  None    Rx / DC Orders ED Discharge Orders     None

## 2023-06-14 NOTE — ED Triage Notes (Signed)
States hx of type 1 diabetes, started feeling "awful" last night, like he does when he has hyperglycemia. Did not check CBG today so unsure of levels. Has been out of novolog the past few days. Took levemir last night. Emesis this morning, fatigue.

## 2023-06-14 NOTE — Discharge Instructions (Addendum)
I am not sure why you felt about earlier.  Your lab work is not consistent with diabetic ketoacidosis and your symptoms have improved.  Please follow-up with your family doctor in the office.  Please return for worsening or persistent symptoms.

## 2023-08-26 ENCOUNTER — Other Ambulatory Visit: Payer: Self-pay

## 2023-08-26 ENCOUNTER — Encounter (HOSPITAL_BASED_OUTPATIENT_CLINIC_OR_DEPARTMENT_OTHER): Payer: Self-pay | Admitting: Internal Medicine

## 2023-08-26 ENCOUNTER — Inpatient Hospital Stay (HOSPITAL_BASED_OUTPATIENT_CLINIC_OR_DEPARTMENT_OTHER)
Admission: EM | Admit: 2023-08-26 | Discharge: 2023-08-27 | DRG: 638 | Disposition: A | Discharge status: Home / Self Care | Payer: MEDICAID | Attending: Internal Medicine | Admitting: Internal Medicine

## 2023-08-26 DIAGNOSIS — E860 Dehydration: Secondary | ICD-10-CM | POA: Diagnosis present

## 2023-08-26 DIAGNOSIS — I8290 Acute embolism and thrombosis of unspecified vein: Secondary | ICD-10-CM | POA: Diagnosis not present

## 2023-08-26 DIAGNOSIS — E440 Moderate protein-calorie malnutrition: Secondary | ICD-10-CM | POA: Diagnosis present

## 2023-08-26 DIAGNOSIS — Z9889 Other specified postprocedural states: Secondary | ICD-10-CM

## 2023-08-26 DIAGNOSIS — F17210 Nicotine dependence, cigarettes, uncomplicated: Secondary | ICD-10-CM | POA: Diagnosis present

## 2023-08-26 DIAGNOSIS — Z833 Family history of diabetes mellitus: Secondary | ICD-10-CM

## 2023-08-26 DIAGNOSIS — J45909 Unspecified asthma, uncomplicated: Secondary | ICD-10-CM | POA: Diagnosis present

## 2023-08-26 DIAGNOSIS — N179 Acute kidney failure, unspecified: Secondary | ICD-10-CM | POA: Diagnosis present

## 2023-08-26 DIAGNOSIS — Z1152 Encounter for screening for COVID-19: Secondary | ICD-10-CM | POA: Diagnosis not present

## 2023-08-26 DIAGNOSIS — Z91148 Patient's other noncompliance with medication regimen for other reason: Secondary | ICD-10-CM

## 2023-08-26 DIAGNOSIS — I7411 Embolism and thrombosis of thoracic aorta: Secondary | ICD-10-CM | POA: Diagnosis present

## 2023-08-26 DIAGNOSIS — E871 Hypo-osmolality and hyponatremia: Secondary | ICD-10-CM | POA: Diagnosis present

## 2023-08-26 DIAGNOSIS — F209 Schizophrenia, unspecified: Secondary | ICD-10-CM | POA: Diagnosis present

## 2023-08-26 DIAGNOSIS — E1110 Type 2 diabetes mellitus with ketoacidosis without coma: Secondary | ICD-10-CM | POA: Diagnosis present

## 2023-08-26 DIAGNOSIS — E1010 Type 1 diabetes mellitus with ketoacidosis without coma: Principal | ICD-10-CM | POA: Diagnosis present

## 2023-08-26 DIAGNOSIS — Z681 Body mass index (BMI) 19 or less, adult: Secondary | ICD-10-CM | POA: Diagnosis not present

## 2023-08-26 DIAGNOSIS — F1220 Cannabis dependence, uncomplicated: Secondary | ICD-10-CM | POA: Diagnosis present

## 2023-08-26 DIAGNOSIS — K760 Fatty (change of) liver, not elsewhere classified: Secondary | ICD-10-CM | POA: Diagnosis present

## 2023-08-26 DIAGNOSIS — Z91040 Latex allergy status: Secondary | ICD-10-CM

## 2023-08-26 DIAGNOSIS — Z7901 Long term (current) use of anticoagulants: Secondary | ICD-10-CM

## 2023-08-26 DIAGNOSIS — T383X6A Underdosing of insulin and oral hypoglycemic [antidiabetic] drugs, initial encounter: Secondary | ICD-10-CM | POA: Diagnosis present

## 2023-08-26 DIAGNOSIS — Z794 Long term (current) use of insulin: Secondary | ICD-10-CM | POA: Diagnosis not present

## 2023-08-26 DIAGNOSIS — K219 Gastro-esophageal reflux disease without esophagitis: Secondary | ICD-10-CM | POA: Diagnosis present

## 2023-08-26 DIAGNOSIS — I7409 Other arterial embolism and thrombosis of abdominal aorta: Secondary | ICD-10-CM | POA: Diagnosis present

## 2023-08-26 DIAGNOSIS — I5022 Chronic systolic (congestive) heart failure: Secondary | ICD-10-CM | POA: Diagnosis present

## 2023-08-26 DIAGNOSIS — Z79899 Other long term (current) drug therapy: Secondary | ICD-10-CM | POA: Diagnosis not present

## 2023-08-26 DIAGNOSIS — R632 Polyphagia: Secondary | ICD-10-CM | POA: Diagnosis present

## 2023-08-26 DIAGNOSIS — Z716 Tobacco abuse counseling: Secondary | ICD-10-CM

## 2023-08-26 DIAGNOSIS — Z7982 Long term (current) use of aspirin: Secondary | ICD-10-CM

## 2023-08-26 DIAGNOSIS — F32A Depression, unspecified: Secondary | ICD-10-CM | POA: Diagnosis present

## 2023-08-26 DIAGNOSIS — Z91199 Patient's noncompliance with other medical treatment and regimen due to unspecified reason: Secondary | ICD-10-CM

## 2023-08-26 DIAGNOSIS — D72829 Elevated white blood cell count, unspecified: Secondary | ICD-10-CM | POA: Diagnosis present

## 2023-08-26 DIAGNOSIS — R7401 Elevation of levels of liver transaminase levels: Secondary | ICD-10-CM | POA: Diagnosis present

## 2023-08-26 DIAGNOSIS — Z884 Allergy status to anesthetic agent status: Secondary | ICD-10-CM

## 2023-08-26 DIAGNOSIS — E875 Hyperkalemia: Secondary | ICD-10-CM | POA: Diagnosis present

## 2023-08-26 DIAGNOSIS — E1165 Type 2 diabetes mellitus with hyperglycemia: Secondary | ICD-10-CM

## 2023-08-26 LAB — COMPREHENSIVE METABOLIC PANEL
ALT: 83 U/L — ABNORMAL HIGH (ref 0–44)
AST: 74 U/L — ABNORMAL HIGH (ref 15–41)
Albumin: 4.7 g/dL (ref 3.5–5.0)
Alkaline Phosphatase: 277 U/L — ABNORMAL HIGH (ref 38–126)
BUN: 31 mg/dL — ABNORMAL HIGH (ref 6–20)
CO2: <7 mmol/L — ABNORMAL LOW (ref 22–32)
Calcium: 9.1 mg/dL (ref 8.9–10.3)
Chloride: 88 mmol/L — ABNORMAL LOW (ref 98–111)
Creatinine, Ser: 1.72 mg/dL — ABNORMAL HIGH (ref 0.61–1.24)
GFR, Estimated: 48 mL/min — ABNORMAL LOW (ref >60)
Glucose, Bld: >1200 mg/dL (ref 70–99)
Potassium: 5.6 mmol/L — ABNORMAL HIGH (ref 3.5–5.1)
Sodium: 125 mmol/L — ABNORMAL LOW (ref 135–145)
Total Bilirubin: 1.5 mg/dL — ABNORMAL HIGH (ref 0.3–1.2)
Total Protein: 8.4 g/dL — ABNORMAL HIGH (ref 6.5–8.1)

## 2023-08-26 LAB — CBC WITH DIFFERENTIAL/PLATELET
Abs Immature Granulocytes: 0.62 10*3/uL — ABNORMAL HIGH (ref 0.00–0.07)
Basophils Absolute: 0.2 10*3/uL — ABNORMAL HIGH (ref 0.0–0.1)
Basophils Relative: 1 %
Eosinophils Absolute: 0 10*3/uL (ref 0.0–0.5)
Eosinophils Relative: 0 %
HCT: 44.1 % (ref 39.0–52.0)
Hemoglobin: 13.5 g/dL (ref 13.0–17.0)
Immature Granulocytes: 4 %
Lymphocytes Relative: 12 %
Lymphs Abs: 2.1 10*3/uL (ref 0.7–4.0)
MCH: 32.8 pg (ref 26.0–34.0)
MCHC: 30.6 g/dL (ref 30.0–36.0)
MCV: 107 fL — ABNORMAL HIGH (ref 80.0–100.0)
Monocytes Absolute: 1.4 10*3/uL — ABNORMAL HIGH (ref 0.1–1.0)
Monocytes Relative: 8 %
Neutro Abs: 13.6 10*3/uL — ABNORMAL HIGH (ref 1.7–7.7)
Neutrophils Relative %: 75 %
Platelets: 496 10*3/uL — ABNORMAL HIGH (ref 150–400)
RBC: 4.12 MIL/uL — ABNORMAL LOW (ref 4.22–5.81)
RDW: 13.2 % (ref 11.5–15.5)
WBC: 17.9 10*3/uL — ABNORMAL HIGH (ref 4.0–10.5)
nRBC: 0 % (ref 0.0–0.2)

## 2023-08-26 LAB — URINALYSIS, MICROSCOPIC (REFLEX)

## 2023-08-26 LAB — I-STAT VENOUS BLOOD GAS, ED
Acid-base deficit: 24 mmol/L — ABNORMAL HIGH (ref 0.0–2.0)
Acid-base deficit: 25 mmol/L — ABNORMAL HIGH (ref 0.0–2.0)
Bicarbonate: 5.2 mmol/L — ABNORMAL LOW (ref 20.0–28.0)
Bicarbonate: 4.4 mmol/L — ABNORMAL LOW (ref 20.0–28.0)
Calcium, Ion: 1.1 mmol/L — ABNORMAL LOW (ref 1.15–1.40)
Calcium, Ion: 1.02 mmol/L — ABNORMAL LOW (ref 1.15–1.40)
HCT: 47 % (ref 39.0–52.0)
HCT: 40 % (ref 39.0–52.0)
Hemoglobin: 16 g/dL (ref 13.0–17.0)
Hemoglobin: 13.6 g/dL (ref 13.0–17.0)
O2 Saturation: 67 %
O2 Saturation: 75 %
Potassium: 5.8 mmol/L — ABNORMAL HIGH (ref 3.5–5.1)
Potassium: 5 mmol/L (ref 3.5–5.1)
Sodium: 123 mmol/L — ABNORMAL LOW (ref 135–145)
Sodium: 130 mmol/L — ABNORMAL LOW (ref 135–145)
TCO2: 6 mmol/L — ABNORMAL LOW (ref 22–32)
TCO2: <5 mmol/L — ABNORMAL LOW (ref 22–32)
pCO2, Ven: 20.3 mmHg — ABNORMAL LOW (ref 44–60)
pCO2, Ven: 16.8 mmHg — CL (ref 44–60)
pH, Ven: 7.014 — CL (ref 7.25–7.43)
pH, Ven: 7.026 — CL (ref 7.25–7.43)
pO2, Ven: 50 mmHg — ABNORMAL HIGH (ref 32–45)
pO2, Ven: 57 mmHg — ABNORMAL HIGH (ref 32–45)

## 2023-08-26 LAB — GLUCOSE, CAPILLARY
Glucose-Capillary: 298 mg/dL — ABNORMAL HIGH (ref 70–99)
Glucose-Capillary: 449 mg/dL — ABNORMAL HIGH (ref 70–99)
Glucose-Capillary: 187 mg/dL — ABNORMAL HIGH (ref 70–99)
Glucose-Capillary: 175 mg/dL — ABNORMAL HIGH (ref 70–99)
Glucose-Capillary: 215 mg/dL — ABNORMAL HIGH (ref 70–99)
Glucose-Capillary: 180 mg/dL — ABNORMAL HIGH (ref 70–99)
Glucose-Capillary: 192 mg/dL — ABNORMAL HIGH (ref 70–99)
Glucose-Capillary: 173 mg/dL — ABNORMAL HIGH (ref 70–99)
Glucose-Capillary: 168 mg/dL — ABNORMAL HIGH (ref 70–99)

## 2023-08-26 LAB — URINALYSIS, ROUTINE W REFLEX MICROSCOPIC
Bilirubin Urine: NEGATIVE
Glucose, UA: >=500 mg/dL — AB
Ketones, ur: >=80 mg/dL — AB
Leukocytes,Ua: NEGATIVE
Nitrite: NEGATIVE
Protein, ur: NEGATIVE mg/dL
Specific Gravity, Urine: 1.02 (ref 1.005–1.030)
pH: 5 (ref 5.0–8.0)

## 2023-08-26 LAB — CBG MONITORING, ED
Glucose-Capillary: >600 mg/dL (ref 70–99)
Glucose-Capillary: >600 mg/dL (ref 70–99)
Glucose-Capillary: 560 mg/dL (ref 70–99)

## 2023-08-26 LAB — SARS CORONAVIRUS 2 BY RT PCR: SARS Coronavirus 2 by RT PCR: NEGATIVE

## 2023-08-26 LAB — BASIC METABOLIC PANEL
Anion gap: 15 (ref 5–15)
Anion gap: 11 (ref 5–15)
BUN: 24 mg/dL — ABNORMAL HIGH (ref 6–20)
BUN: 21 mg/dL — ABNORMAL HIGH (ref 6–20)
CO2: 18 mmol/L — ABNORMAL LOW (ref 22–32)
CO2: 21 mmol/L — ABNORMAL LOW (ref 22–32)
Calcium: 8.5 mg/dL — ABNORMAL LOW (ref 8.9–10.3)
Calcium: 8.3 mg/dL — ABNORMAL LOW (ref 8.9–10.3)
Chloride: 104 mmol/L (ref 98–111)
Chloride: 104 mmol/L (ref 98–111)
Creatinine, Ser: 0.98 mg/dL (ref 0.61–1.24)
Creatinine, Ser: 0.94 mg/dL (ref 0.61–1.24)
GFR, Estimated: >60 mL/min (ref >60)
GFR, Estimated: >60 mL/min (ref >60)
Glucose, Bld: 213 mg/dL — ABNORMAL HIGH (ref 70–99)
Glucose, Bld: 189 mg/dL — ABNORMAL HIGH (ref 70–99)
Potassium: 4.2 mmol/L (ref 3.5–5.1)
Potassium: 3.9 mmol/L (ref 3.5–5.1)
Sodium: 137 mmol/L (ref 135–145)
Sodium: 136 mmol/L (ref 135–145)

## 2023-08-26 LAB — RAPID URINE DRUG SCREEN, HOSP PERFORMED
Amphetamines: NOT DETECTED
Barbiturates: NOT DETECTED
Benzodiazepines: NOT DETECTED
Cocaine: NOT DETECTED
Opiates: NOT DETECTED
Tetrahydrocannabinol: POSITIVE — AB

## 2023-08-26 LAB — BETA-HYDROXYBUTYRIC ACID: Beta-Hydroxybutyric Acid: >8 mmol/L — ABNORMAL HIGH (ref 0.05–0.27)

## 2023-08-26 LAB — LIPASE, BLOOD: Lipase: 28 U/L (ref 11–51)

## 2023-08-26 LAB — OSMOLALITY: Osmolality: 356 mOsm/kg (ref 275–295)

## 2023-08-26 MED ORDER — TRAZODONE HCL 50 MG PO TABS: 25.0000 mg | ORAL_TABLET | Freq: Every evening | ORAL | Status: DC | PRN

## 2023-08-26 MED ORDER — ACETAMINOPHEN 325 MG PO TABS
650.0000 mg | ORAL_TABLET | Freq: Four times a day (QID) | ORAL | Status: DC | PRN
  Administered 2023-08-26 – 2023-08-27 (×2): 650 mg via ORAL
  Filled 2023-08-26 (×2): qty 2

## 2023-08-26 MED ORDER — LACTATED RINGERS IV BOLUS
2000.0000 mL | Freq: Once | INTRAVENOUS | Status: AC
  Administered 2023-08-26: 2000 mL via INTRAVENOUS

## 2023-08-26 MED ORDER — ORAL CARE MOUTH RINSE: 15.0000 mL | OROMUCOSAL | Status: DC | PRN

## 2023-08-26 MED ORDER — POTASSIUM CHLORIDE 10 MEQ/100ML IV SOLN: 10.0000 meq | INTRAVENOUS | Status: DC

## 2023-08-26 MED ORDER — SODIUM BICARBONATE 8.4 % IV SOLN
50.0000 meq | Freq: Once | INTRAVENOUS | Status: AC
  Administered 2023-08-26: 50 meq via INTRAVENOUS
  Filled 2023-08-26: qty 50

## 2023-08-26 MED ORDER — DEXTROSE IN LACTATED RINGERS 5 % IV SOLN: INTRAVENOUS | Status: DC

## 2023-08-26 MED ORDER — ALBUTEROL SULFATE (2.5 MG/3ML) 0.083% IN NEBU: 2.5000 mg | INHALATION_SOLUTION | RESPIRATORY_TRACT | Status: DC | PRN

## 2023-08-26 MED ORDER — CHLORHEXIDINE GLUCONATE CLOTH 2 % EX PADS: 6.0000 | MEDICATED_PAD | Freq: Every day | CUTANEOUS | Status: DC

## 2023-08-26 MED ORDER — LACTATED RINGERS IV BOLUS: 20.0000 mL/kg | Freq: Once | INTRAVENOUS | Status: DC

## 2023-08-26 MED ORDER — ONDANSETRON HCL 4 MG/2ML IJ SOLN
4.0000 mg | Freq: Once | INTRAMUSCULAR | Status: AC
  Administered 2023-08-26: 4 mg via INTRAVENOUS
  Filled 2023-08-26: qty 2

## 2023-08-26 MED ORDER — ONDANSETRON HCL 4 MG/2ML IJ SOLN: 4.0000 mg | Freq: Four times a day (QID) | INTRAMUSCULAR | Status: DC | PRN

## 2023-08-26 MED ORDER — SODIUM CHLORIDE 0.9 % IV BOLUS: 1000.0000 mL | Freq: Once | INTRAVENOUS | Status: DC

## 2023-08-26 MED ORDER — ACETAMINOPHEN 650 MG RE SUPP: 650.0000 mg | Freq: Four times a day (QID) | RECTAL | Status: DC | PRN

## 2023-08-26 MED ORDER — ONDANSETRON HCL 4 MG PO TABS: 4.0000 mg | ORAL_TABLET | Freq: Four times a day (QID) | ORAL | Status: DC | PRN

## 2023-08-26 MED ORDER — SODIUM CHLORIDE 0.9 % IV BOLUS
1000.0000 mL | Freq: Once | INTRAVENOUS | Status: AC
  Administered 2023-08-26: 1000 mL via INTRAVENOUS

## 2023-08-26 MED ORDER — DEXTROSE 50 % IV SOLN: 0.0000 mL | INTRAVENOUS | Status: DC | PRN

## 2023-08-26 MED ORDER — LACTATED RINGERS IV SOLN: INTRAVENOUS | Status: AC

## 2023-08-26 MED ORDER — ENOXAPARIN SODIUM 40 MG/0.4ML IJ SOSY
40.0000 mg | PREFILLED_SYRINGE | Freq: Every day | INTRAMUSCULAR | Status: DC
  Administered 2023-08-26: 40 mg via SUBCUTANEOUS
  Filled 2023-08-26: qty 0.4

## 2023-08-26 MED ORDER — INSULIN REGULAR(HUMAN) IN NACL 100-0.9 UT/100ML-% IV SOLN
INTRAVENOUS | Status: DC
  Administered 2023-08-26: 6 [IU]/h via INTRAVENOUS
  Filled 2023-08-26: qty 100

## 2023-08-26 MED ORDER — PANTOPRAZOLE SODIUM 40 MG PO TBEC
40.0000 mg | DELAYED_RELEASE_TABLET | Freq: Every day | ORAL | Status: DC
  Administered 2023-08-26 – 2023-08-27 (×2): 40 mg via ORAL
  Filled 2023-08-26 (×2): qty 1

## 2023-08-26 NOTE — Care Management (Signed)
Transition of Care [facility]) - Inpatient Brief Assessment   Patient Details  Name: Agam Eveland MRN: [id number] Date of Birth: [birth year]  Transition of Care [facility]) CM/SW Contact:    Lavenia Atlas, RN Phone Number: 08/26/2023, 6:56 PM   Clinical Narrative: Per chart review patient currently in [facility] SDU for DKA.   Transition of Care [facility]) Department has reviewed patient and no TOC needs have been identified at this time. We will continue to monitor patient advancement through Interdisciplinary progressions and if new patient needs arise, please place a consult.    Transition of Care Asessment: Insurance and Status: Insurance coverage has been reviewed Patient has primary care physician: Yes Home environment has been reviewed: from home Prior level of function:: independent Prior/Current Home Services: No current home services Social Determinants of Health Reivew: SDOH reviewed no interventions necessary Readmission risk has been reviewed: Yes Transition of care needs: no transition of care needs at this time

## 2023-08-26 NOTE — ED Triage Notes (Signed)
Pt reports checking sugars at home and the highest read was 600s.   Pt father reports hx of DKA

## 2023-08-26 NOTE — ED Notes (Addendum)
Fall risk sign on door Fall risk armband Patient wearing sneekers

## 2023-08-26 NOTE — ED Notes (Signed)
Called [facility] for transport spoke with [employer]

## 2023-08-26 NOTE — ED Provider Notes (Signed)
I provided a substantive portion of the care of this patient.  I personally made/approved the management plan for this patient and take responsibility for the patient management.   CRITICAL CARE Performed by: Sloan Leiter   Total critical care time: 45 minutes  Critical care time was exclusive of separately billable procedures and treating other patients.  Critical care was necessary to treat or prevent imminent or life-threatening deterioration.  Critical care was time spent personally by me on the following activities: development of treatment plan with patient and/or surrogate as well as nursing, discussions with consultants, evaluation of patient's response to treatment, examination of patient, obtaining history from patient or surrogate, ordering and performing treatments and interventions, ordering and review of laboratory studies, ordering and review of radiographic studies, pulse oximetry and re-evaluation of patient's condition. Endocrine crisis. Pt hx IDDM, schizophrenia, reports he lost his insulin or stopped taking it over the last 2-3 days. Has been having nausea/vomiting over last 24 hours. Minimal PO intake. Polydipsia, polyuria. Fatigue. Here with kussmaul breathing, tahcycardia, w/u concerning for DKA vs HHS vs hyperglycemic crisis. pH low, give bicarb, start fluid resus. DKA protocol. Pt will require admission.      Emergency Ultrasound Study:   Angiocath insertion Performed by: Sloan Leiter  Consent: Verbal consent obtained. Risks and benefits: risks, benefits and alternatives were discussed Immediately prior to procedure the correct patient, procedure, equipment, support staff and site/side marked as needed.  Indication: difficult IV access Preparation: Patient was prepped and draped in the usual sterile fashion. Vein Location: The right median cubital vein was visualized during assessment for potential access sites and was found to be patent/ easily compressed with  linear ultrasound.  The needle was visualized with real-time ultrasound and guided into the vein. Gauge: 18g  Images were not saved due to the time sensitive nature of the situation. Normal blood return.  Patient tolerance: Patient tolerated the procedure well with no immediate complications.   EKG Interpretation Date/Time:  Thursday August 26 2023 09:06:41 EDT Ventricular Rate:  116 PR Interval:  141 QRS Duration:  92 QT Interval:  324 QTC Calculation: 451 R Axis:   94  Text Interpretation: Sinus tachycardia Right atrial enlargement Pattern suggests chronic pulmonary disease Confirmed by Tanda Rockers ([id number]) on 08/26/2023 9:07:47 AM    Sloan Leiter, DO 08/26/23 1156

## 2023-08-26 NOTE — H&P (Signed)
History and Physical  Tom Anderson WUJ:[id number] DOB: 1971/10/08 DOA: 08/26/2023  PCP: Montez Hageman, DO   Chief Complaint: vomiting   HPI: Tom Anderson is a 52 y.o. 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  He was admitted to [facility] hospital in June 2024 with DKA as well.  Today he tells me that he last took his insulin a couple of days ago, says that he cannot find his insulin.  Says that he never really took the Xarelto that he was on for his aortic thrombosis, denies taking any other prescription medications.  Still smokes cigarettes and marijuana.  Started having nausea vomiting and inability to tolerate p.o. intake since yesterday.  Denies chest pain, fevers, chills, diarrhea, blood in stool or emesis.  Currently says he is not feeling great, but feels a lot better than he did when he was in the emergency department.  ED Course: On arrival this morning to the emergency department, temperature 97.5, heart rate 122, blood pressure stable and saturating well on room air.  Lab work was done which demonstrated initial blood sugar greater than 1200, potassium 5.6, sodium 125, bicarb less than 7, AST 74, ALT 83, total bilirubin 1.5, WBC 18, platelets 496, venous blood gas 7.01 03/19/1947  Review of Systems: Please see HPI for pertinent positives and negatives. A complete 10 system review of systems are otherwise negative.  Past Medical History:  Diagnosis Date   Asthma    Depression    Diabetes mellitus without complication (HCC)    Schizophrenia (HCC)    Uses self-applied continuous glucose monitoring device    Past Surgical History:  Procedure Laterality Date   HERNIA REPAIR      Social History:  reports that he has been smoking cigarettes. He has never used smokeless tobacco. He reports current  alcohol use. He reports current drug use. Drug: Marijuana.   Allergies  Allergen Reactions   Nitrous Oxide Nausea And Vomiting   Latex Rash    Family History  Problem Relation Age of Onset   Diabetes Maternal Grandfather      Prior to Admission medications   Medication Sig Start Date End Date Taking? Authorizing Provider  acetaminophen (TYLENOL) 325 MG tablet Take 2 tablets (650 mg total) by mouth every 6 (six) hours as needed for mild pain (or Fever >/= 101). 05/22/23   Meredeth Ide, MD  ARIPiprazole (ABILIFY) 10 MG tablet Take 1 tablet (10 mg total) by mouth daily. Patient taking differently: Take 10 mg by mouth 3 (three) times a week. 03/23/23   Rai, Delene Ruffini, MD  aspirin 81 MG chewable tablet Chew 1 tablet (81 mg total) by mouth daily. Patient not taking: Reported on 05/21/2023 03/23/23   Rai, Delene Ruffini, MD  Continuous Glucose Sensor (DEXCOM G6 SENSOR) MISC Inject 1 Device into the skin See admin instructions. Place 1 new sensor into the skin every 10 days 05/15/23   [provider]  feeding supplement, GLUCERNA SHAKE, (GLUCERNA SHAKE) LIQD Take 237 mLs by mouth 2 (two) times daily between meals. 01/15/23   [name], MD  folic acid (FOLVITE) 1 MG tablet Take 1 tablet (1 mg total) by mouth daily. 03/23/23   Rai, Ripudeep K, MD  insulin aspart (NOVOLOG) 100 UNIT/ML FlexPen For glucose 121 to 150 use 1 unit, for 151 to 200 use 2 units, for 201 to 250  use 3 units, for 251 to 300 use 5 units, for 301 to 350 use 7 units for 351 or greater use 9 units. Patient not taking: Reported on 05/21/2023 10/07/20   [name], [name], MD  insulin detemir (LEVEMIR) 100 UNIT/ML FlexPen Inject 8 Units into the skin at bedtime. 03/23/23   Rai, Delene Ruffini, MD  metoCLOPramide (REGLAN) 5 MG tablet Take 1 tablet (5 mg total) by mouth every 8 (eight) hours as needed for nausea or vomiting. Patient not taking: Reported on 05/21/2023 03/23/23   Rai, Delene Ruffini, MD  nicotine (NICODERM CQ - DOSED IN  MG/24 HOURS) 21 mg/24hr patch Place 1 patch (21 mg total) onto the skin daily. Patient not taking: Reported on 05/21/2023 05/03/23   Burnadette Pop, MD  pantoprazole (PROTONIX) 40 MG tablet Take 1 tablet (40 mg total) by mouth daily. Patient taking differently: Take 40 mg by mouth daily before lunch. 03/23/23   Rai, Delene Ruffini, MD  rivaroxaban (XARELTO) 20 MG TABS tablet Take 1 tablet (20 mg total) by mouth daily with supper. Take this when the xarelto starter pack is completed. Patient not taking: Reported on 05/21/2023 04/12/23   Rai, Delene Ruffini, MD  traZODone (DESYREL) 50 MG tablet Take 1 tablet (50 mg total) by mouth at bedtime. Patient not taking: Reported on 05/21/2023 03/24/23   Russella Dar, NP  VENTOLIN HFA 108 (90 Base) MCG/ACT inhaler Inhale 2 puffs into the lungs every 4 (four) hours as needed for wheezing or shortness of breath. 05/22/23   Meredeth Ide, MD    Physical Exam: BP 123/82   Pulse (!) 125   Temp (!) 97.5 F (36.4 C) (Oral)   Resp 14   Ht 5\' 9"  (1.753 m)   Wt 56.7 kg   SpO2 100%   BMI 18.46 kg/m   General:  Alert, calm, in no acute distress, he is disheveled, looks slightly uncomfortable but not acutely ill, awake alert and oriented, pleasant and conversant Neck: supple, no masses, trachea mildline  Cardiovascular: Good peripheral pulses, regular, sinus rhythm on monitor Respiratory: Good equal bilateral chest rise, no tachypnea, respiratory distress, no audible wheezing or stridor Abdomen: soft, nontender, nondistended, normal bowel tones heard  Skin: dry, no rashes  Musculoskeletal: no joint effusions, normal range of motion  Psychiatric: appropriate affect, normal speech  Neurologic: extraocular muscles intact, clear speech, moving all extremities with intact sensorium         Labs on Admission:  Basic Metabolic Panel: Recent Labs  Lab 08/26/23 0912 08/26/23 0915 08/26/23 1128  NA 125* 123* 130*  K 5.6* 5.8* 5.0  CL 88*  --   --   CO2 <7*  --   --    GLUCOSE >1,200*  --   --   BUN 31*  --   --   CREATININE 1.72*  --   --   CALCIUM 9.1  --   --    Liver Function Tests: Recent Labs  Lab 08/26/23 0912  AST 74*  ALT 83*  ALKPHOS 277*  BILITOT 1.5*  PROT 8.4*  ALBUMIN 4.7   Recent Labs  Lab 08/26/23 0912  LIPASE 28   No results for input(s): "AMMONIA" in the last 168 hours. CBC: Recent Labs  Lab 08/26/23 0912 08/26/23 0915 08/26/23 1128  WBC 17.9*  --   --   NEUTROABS 13.6*  --   --   HGB 13.5 16.0 13.6  HCT 44.1 47.0 40.0  MCV 107.0*  --   --  PLT 496*  --   --    Cardiac Enzymes: No results for input(s): "CKTOTAL", "CKMB", "CKMBINDEX", "TROPONINI" in the last 168 hours.  BNP (last 3 results) No results for input(s): "BNP" in the last 8760 hours.  ProBNP (last 3 results) No results for input(s): "PROBNP" in the last 8760 hours.  CBG: Recent Labs  Lab 08/26/23 0902 08/26/23 1053 08/26/23 1229  GLUCAP >600* >600* 560*    Radiological Exams on Admission: No results found.  Assessment/Plan Tom Anderson is a 52 y.o. 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    Diabetic ketoacidosis-with no evidence of acute infection as inciting cause, likely due to medication noncompliance.  Patient states he is feeling better, his nausea is improved.  Blood sugars gradually improving. -Inpatient admission to stepdown -IV fluids, switch to dextrose containing fluids once blood sugars less than 250 -IV insulin drip per Endo tool -N.p.o. -Pain and nausea medication as needed -Follow every 4-hour BMP, until anion gap is closed x 2  Metabolic acidosis-due to severe hyperglycemia, will start some bicarb containing fluid if still severe acidosis  History of aortic thrombus-patient states he took Xarelto starting in April for only very short  period of time.  Will not resume Xarelto at this time due to lack of follow-up and compliance.  Heart failure with reduced EF-currently not on any medication, patient appears euvolemic or slightly dry on exam, no evidence of acute heart failure exacerbation.  Pseudohyponatremia-due to severe hyperglycemia, is improving  GERD-Protonix  DVT prophylaxis: Lovenox     Code Status: Full Code  Consults called: None  Admission status: The appropriate patient status for this patient is INPATIENT. Inpatient status is judged to be reasonable and necessary in order to provide the required intensity of service to ensure the patient's safety. The patient's presenting symptoms, physical exam findings, and initial radiographic and laboratory data in the context of their chronic comorbidities is felt to place them at high risk for further clinical deterioration. Furthermore, it is not anticipated that the patient will be medically stable for discharge from the hospital within 2 midnights of admission.    I certify that at the point of admission it is my clinical judgment that the patient will require inpatient hospital care spanning beyond 2 midnights from the point of admission due to high intensity of service, high risk for further deterioration and high frequency of surveillance required  Time spent: 49 minutes  Virgin Zellers Sharlette Dense MD [facility] Pager [phone]  If 7PM-7AM, please contact night-coverage [email or web address] Password [email or web address]  08/26/2023, 1:23 PM

## 2023-08-26 NOTE — ED Provider Notes (Signed)
[place] [facility] AT [facility] Provider Note   CSN: [id number] Arrival date & time: 08/26/23  2025     History  Chief Complaint  Patient presents with   Hyperglycemia    Rutherford Groves is a 52 y.o. male.  52 year old male presents today for concern of nausea, vomiting, decreased p.o. intake since yesterday.  He is a type I diabetic.  He reports he has not insulin as he was supposed to since this morning because he could not find it.  No chest pain, cough.  Endorses polydipsia and polyphagia.  The history is provided by the patient and medical records. No language interpreter was used.       Home Medications Prior to Admission medications   Medication Sig Start Date End Date Taking? Authorizing Provider  acetaminophen (TYLENOL) 325 MG tablet Take 2 tablets (650 mg total) by mouth every 6 (six) hours as needed for mild pain (or Fever >/= 101). 05/22/23   Meredeth Ide, MD  ARIPiprazole (ABILIFY) 10 MG tablet Take 1 tablet (10 mg total) by mouth daily. Patient taking differently: Take 10 mg by mouth 3 (three) times a week. 03/23/23   Rai, Delene Ruffini, MD  aspirin 81 MG chewable tablet Chew 1 tablet (81 mg total) by mouth daily. Patient not taking: Reported on 05/21/2023 03/23/23   Rai, Delene Ruffini, MD  Continuous Glucose Sensor (DEXCOM G6 SENSOR) MISC Inject 1 Device into the skin See admin instructions. Place 1 new sensor into the skin every 10 days 05/15/23   [provider]  feeding supplement, GLUCERNA SHAKE, (GLUCERNA SHAKE) LIQD Take 237 mLs by mouth 2 (two) times daily between meals. 01/15/23   [name], MD  folic acid (FOLVITE) 1 MG tablet Take 1 tablet (1 mg total) by mouth daily. 03/23/23   Rai, Ripudeep K, MD  insulin aspart (NOVOLOG) 100 UNIT/ML FlexPen For glucose 121 to 150 use 1 unit, for 151 to 200 use 2 units, for 201 to 250 use 3 units, for 251 to 300 use 5 units, for 301 to 350 use 7 units for 351 or greater use 9 units. Patient not  taking: Reported on 05/21/2023 10/07/20   [name], [name], MD  insulin detemir (LEVEMIR) 100 UNIT/ML FlexPen Inject 8 Units into the skin at bedtime. 03/23/23   Rai, Delene Ruffini, MD  metoCLOPramide (REGLAN) 5 MG tablet Take 1 tablet (5 mg total) by mouth every 8 (eight) hours as needed for nausea or vomiting. Patient not taking: Reported on 05/21/2023 03/23/23   Rai, Delene Ruffini, MD  nicotine (NICODERM CQ - DOSED IN MG/24 HOURS) 21 mg/24hr patch Place 1 patch (21 mg total) onto the skin daily. Patient not taking: Reported on 05/21/2023 05/03/23   Burnadette Pop, MD  pantoprazole (PROTONIX) 40 MG tablet Take 1 tablet (40 mg total) by mouth daily. Patient taking differently: Take 40 mg by mouth daily before lunch. 03/23/23   Rai, Delene Ruffini, MD  rivaroxaban (XARELTO) 20 MG TABS tablet Take 1 tablet (20 mg total) by mouth daily with supper. Take this when the xarelto starter pack is completed. Patient not taking: Reported on 05/21/2023 04/12/23   Rai, Delene Ruffini, MD  traZODone (DESYREL) 50 MG tablet Take 1 tablet (50 mg total) by mouth at bedtime. Patient not taking: Reported on 05/21/2023 03/24/23   Russella Dar, NP  VENTOLIN HFA 108 (90 Base) MCG/ACT inhaler Inhale 2 puffs into the lungs every 4 (four) hours as needed for wheezing or shortness  of breath. 05/22/23   Meredeth Ide, MD      Allergies    Nitrous oxide and Latex    Review of Systems   Review of Systems  Constitutional:  Positive for fatigue. Negative for fever.  Respiratory:  Negative for cough and shortness of breath.   Cardiovascular:  Negative for chest pain.  Gastrointestinal:  Positive for nausea and vomiting. Negative for abdominal pain.  Genitourinary:  Negative for dysuria and flank pain.  Neurological:  Negative for light-headedness.  All other systems reviewed and are negative.   Physical Exam Updated Vital Signs BP 126/68   Pulse (!) 114   Temp (!) 97.5 F (36.4 C) (Oral)   Resp 15   Ht 5\' 9"  (1.753 m)   Wt  56.7 kg   SpO2 100%   BMI 18.46 kg/m  Physical Exam Vitals and nursing note reviewed.  Constitutional:      General: He is not in acute distress.    Appearance: Normal appearance. He is not ill-appearing.  HENT:     Head: Normocephalic and atraumatic.     Nose: Nose normal.  Eyes:     Conjunctiva/sclera: Conjunctivae normal.  Cardiovascular:     Rate and Rhythm: Normal rate and regular rhythm.  Pulmonary:     Effort: Pulmonary effort is normal. No respiratory distress.  Abdominal:     General: There is no distension.     Palpations: Abdomen is soft.     Tenderness: There is no abdominal tenderness. There is no guarding.  Musculoskeletal:        General: No deformity. Normal range of motion.     Cervical back: Normal range of motion.  Skin:    Findings: No rash.  Neurological:     Mental Status: He is alert.     ED Results / Procedures / Treatments   Labs (all labs ordered are listed, but only abnormal results are displayed) Labs Reviewed  URINALYSIS, ROUTINE W REFLEX MICROSCOPIC - Abnormal; Notable for the following components:      Result Value   Glucose, UA >=500 (*)    Hgb urine dipstick TRACE (*)    Ketones, ur >=80 (*)    All other components within normal limits  COMPREHENSIVE METABOLIC PANEL - Abnormal; Notable for the following components:   Sodium 125 (*)    Potassium 5.6 (*)    Chloride 88 (*)    CO2 <7 (*)    Glucose, Bld >1,200 (*)    BUN 31 (*)    Creatinine, Ser 1.72 (*)    Total Protein 8.4 (*)    AST 74 (*)    ALT 83 (*)    Alkaline Phosphatase 277 (*)    Total Bilirubin 1.5 (*)    GFR, Estimated 48 (*)    All other components within normal limits  CBC WITH DIFFERENTIAL/PLATELET - Abnormal; Notable for the following components:   WBC 17.9 (*)    RBC 4.12 (*)    MCV 107.0 (*)    Platelets 496 (*)    Neutro Abs 13.6 (*)    Monocytes Absolute 1.4 (*)    Basophils Absolute 0.2 (*)    Abs Immature Granulocytes 0.62 (*)    All other  components within normal limits  RAPID URINE DRUG SCREEN, HOSP PERFORMED - Abnormal; Notable for the following components:   Tetrahydrocannabinol POSITIVE (*)    All other components within normal limits  URINALYSIS, MICROSCOPIC (REFLEX) - Abnormal; Notable for the following components:  Bacteria, UA RARE (*)    All other components within normal limits  CBG MONITORING, ED - Abnormal; Notable for the following components:   Glucose-Capillary >600 (*)    All other components within normal limits  CBG MONITORING, ED - Abnormal; Notable for the following components:   Glucose-Capillary >600 (*)    All other components within normal limits  I-STAT VENOUS BLOOD GAS, ED - Abnormal; Notable for the following components:   pH, Ven 7.014 (*)    pCO2, Ven 20.3 (*)    pO2, Ven 50 (*)    Bicarbonate 5.2 (*)    TCO2 6 (*)    Acid-base deficit 24.0 (*)    Sodium 123 (*)    Potassium 5.8 (*)    Calcium, Ion 1.10 (*)    All other components within normal limits  I-STAT VENOUS BLOOD GAS, ED - Abnormal; Notable for the following components:   pH, Ven 7.026 (*)    pCO2, Ven 16.8 (*)    pO2, Ven 57 (*)    Bicarbonate 4.4 (*)    TCO2 <5 (*)    Acid-base deficit 25.0 (*)    Sodium 130 (*)    Calcium, Ion 1.02 (*)    All other components within normal limits  SARS CORONAVIRUS 2 BY RT PCR  LIPASE, BLOOD  OSMOLALITY  BETA-HYDROXYBUTYRIC ACID  CBG MONITORING, ED  I-STAT VENOUS BLOOD GAS, ED    EKG EKG Interpretation Date/Time:  Thursday August 26 2023 09:06:41 EDT Ventricular Rate:  116 PR Interval:  141 QRS Duration:  92 QT Interval:  324 QTC Calculation: 451 R Axis:   94  Text Interpretation: Sinus tachycardia Right atrial enlargement Pattern suggests chronic pulmonary disease Confirmed by Tanda Rockers ([id number]) on 08/26/2023 9:07:47 AM  Radiology No results found.  Procedures .Critical Care  Performed by: Marita Kansas, PA-C Authorized by: Marita Kansas, PA-C   Critical care provider  statement:    Critical care time (minutes):  31   Critical care was necessary to treat or prevent imminent or life-threatening deterioration of the following conditions:  Metabolic crisis   Critical care was time spent personally by me on the following activities:  Development of treatment plan with patient or surrogate, discussions with consultants, evaluation of patient's response to treatment, examination of patient, ordering and review of laboratory studies, ordering and review of radiographic studies, ordering and performing treatments and interventions, pulse oximetry, re-evaluation of patient's condition and review of old charts   Care discussed with: admitting provider       Medications Ordered in ED Medications  insulin regular, human (MYXREDLIN) 100 units/ 100 mL infusion (6 Units/hr Intravenous New Bag/Given 08/26/23 1101)  dextrose 5 % in lactated ringers infusion (0 mLs Intravenous Hold 08/26/23 0925)  dextrose 50 % solution 0-50 mL (has no administration in time range)  sodium bicarbonate injection 50 mEq (50 mEq Intravenous Given 08/26/23 1049)  sodium chloride 0.9 % bolus 1,000 mL (0 mLs Intravenous Stopped 08/26/23 1040)  ondansetron (ZOFRAN) injection 4 mg (4 mg Intravenous Given 08/26/23 0955)  lactated ringers bolus 2,000 mL (2,000 mLs Intravenous New Bag/Given 08/26/23 1036)    ED Course/ Medical Decision Making/ A&P                                 Medical Decision Making Amount and/or Complexity of Data Reviewed Labs: ordered.  Risk Prescription drug management. Decision regarding hospitalization.   Medical Decision  Making / ED Course   This patient presents to the ED for concern of nausea, vomiting, this involves an extensive number of treatment options, and is a complaint that carries with it a high risk of complications and morbidity.  The differential diagnosis includes DKA, gastroenteritis, other infection  MDM: Patient with history of type 1 diabetes,  schizophrenia reports he has not been able to take his insulin as directed since this morning.  Reports nausea and vomiting and decreased p.o. intake over the past day.  Fatigue.  On exam he does have coo small breathing.  Tachycardia with sinus tach.  Will obtain blood work to further evaluate for DKA.  Fluid resuscitation ordered.  Workup concerning for DKA.  CBC with leukocytosis but no significant left shift.  VBG shows acidosis with pH of 7.01 and 7.02 on repeat.  COVID-negative.  CMP shows renal insufficiency with creatinine of 1.72, glucose greater than 1200, potassium 5.6.  Sodium 125 likely pseudohyponatremia due to hyperglycemia.  UA shows ketones.  Workup consistent with DKA.  Endo tool initiated.  Bicarb given.  Discussed with hospitalist will evaluate patient for admission.   Lab Tests: -I ordered, reviewed, and interpreted labs.   The pertinent results include:   Labs Reviewed  URINALYSIS, ROUTINE W REFLEX MICROSCOPIC - Abnormal; Notable for the following components:      Result Value   Glucose, UA >=500 (*)    Hgb urine dipstick TRACE (*)    Ketones, ur >=80 (*)    All other components within normal limits  COMPREHENSIVE METABOLIC PANEL - Abnormal; Notable for the following components:   Sodium 125 (*)    Potassium 5.6 (*)    Chloride 88 (*)    CO2 <7 (*)    Glucose, Bld >1,200 (*)    BUN 31 (*)    Creatinine, Ser 1.72 (*)    Total Protein 8.4 (*)    AST 74 (*)    ALT 83 (*)    Alkaline Phosphatase 277 (*)    Total Bilirubin 1.5 (*)    GFR, Estimated 48 (*)    All other components within normal limits  CBC WITH DIFFERENTIAL/PLATELET - Abnormal; Notable for the following components:   WBC 17.9 (*)    RBC 4.12 (*)    MCV 107.0 (*)    Platelets 496 (*)    Neutro Abs 13.6 (*)    Monocytes Absolute 1.4 (*)    Basophils Absolute 0.2 (*)    Abs Immature Granulocytes 0.62 (*)    All other components within normal limits  RAPID URINE DRUG SCREEN, HOSP PERFORMED -  Abnormal; Notable for the following components:   Tetrahydrocannabinol POSITIVE (*)    All other components within normal limits  URINALYSIS, MICROSCOPIC (REFLEX) - Abnormal; Notable for the following components:   Bacteria, UA RARE (*)    All other components within normal limits  CBG MONITORING, ED - Abnormal; Notable for the following components:   Glucose-Capillary >600 (*)    All other components within normal limits  CBG MONITORING, ED - Abnormal; Notable for the following components:   Glucose-Capillary >600 (*)    All other components within normal limits  I-STAT VENOUS BLOOD GAS, ED - Abnormal; Notable for the following components:   pH, Ven 7.014 (*)    pCO2, Ven 20.3 (*)    pO2, Ven 50 (*)    Bicarbonate 5.2 (*)    TCO2 6 (*)    Acid-base deficit 24.0 (*)    Sodium  123 (*)    Potassium 5.8 (*)    Calcium, Ion 1.10 (*)    All other components within normal limits  I-STAT VENOUS BLOOD GAS, ED - Abnormal; Notable for the following components:   pH, Ven 7.026 (*)    pCO2, Ven 16.8 (*)    pO2, Ven 57 (*)    Bicarbonate 4.4 (*)    TCO2 <5 (*)    Acid-base deficit 25.0 (*)    Sodium 130 (*)    Calcium, Ion 1.02 (*)    All other components within normal limits  SARS CORONAVIRUS 2 BY RT PCR  LIPASE, BLOOD  OSMOLALITY  BETA-HYDROXYBUTYRIC ACID  CBG MONITORING, ED  I-STAT VENOUS BLOOD GAS, ED      EKG  EKG Interpretation Date/Time:  Thursday August 26 2023 09:06:41 EDT Ventricular Rate:  116 PR Interval:  141 QRS Duration:  92 QT Interval:  324 QTC Calculation: 451 R Axis:   94  Text Interpretation: Sinus tachycardia Right atrial enlargement Pattern suggests chronic pulmonary disease Confirmed by Tanda Rockers ([id number]) on 08/26/2023 9:07:47 AM        Medicines ordered and prescription drug management: Meds ordered this encounter  Medications   DISCONTD: sodium chloride 0.9 % bolus 1,000 mL   DISCONTD: lactated ringers bolus 1,134 mL   sodium bicarbonate  injection 50 mEq   insulin regular, human (MYXREDLIN) 100 units/ 100 mL infusion    Order Specific Question:   EndoTool low target:    Answer:   140    Order Specific Question:   EndoTool high target:    Answer:   180    Order Specific Question:   Type of Diabetes    Answer:   Type 1    Order Specific Question:   Mode of Therapy    Answer:   ENDOX1 for DKA    Order Specific Question:   Start Method    Answer:   EndoTool to calculate   dextrose 5 % in lactated ringers infusion   dextrose 50 % solution 0-50 mL   DISCONTD: potassium chloride 10 mEq in 100 mL IVPB   sodium chloride 0.9 % bolus 1,000 mL   ondansetron (ZOFRAN) injection 4 mg   lactated ringers bolus 2,000 mL    -I have reviewed the patients home medicines and have made adjustments as needed  Critical interventions Endo tool, ongoing fluid resuscitation, bicarb  Cardiac Monitoring: The patient was maintained on a cardiac monitor.  I personally viewed and interpreted the cardiac monitored which showed an underlying rhythm of: Sinus tachycardia   Reevaluation: After the interventions noted above, I reevaluated the patient and found that they have :stayed the same  Co morbidities that complicate the patient evaluation  Past Medical History:  Diagnosis Date   Asthma    Depression    Diabetes mellitus without complication (HCC)    Schizophrenia (HCC)    Uses self-applied continuous glucose monitoring device       Dispostion: Patient discussed with hospitalist will evaluate patient for admission.  Final Clinical Impression(s) / ED Diagnoses Final diagnoses:  None    Rx / DC Orders ED Discharge Orders     None         Marita Kansas, PA-C 08/26/23 1150    Tanda Rockers A, DO 08/26/23 1157

## 2023-08-27 ENCOUNTER — Other Ambulatory Visit (HOSPITAL_COMMUNITY): Payer: Self-pay

## 2023-08-27 DIAGNOSIS — F17210 Nicotine dependence, cigarettes, uncomplicated: Secondary | ICD-10-CM

## 2023-08-27 DIAGNOSIS — R7401 Elevation of levels of liver transaminase levels: Secondary | ICD-10-CM

## 2023-08-27 DIAGNOSIS — Z794 Long term (current) use of insulin: Secondary | ICD-10-CM

## 2023-08-27 DIAGNOSIS — N179 Acute kidney failure, unspecified: Secondary | ICD-10-CM | POA: Diagnosis not present

## 2023-08-27 DIAGNOSIS — E1010 Type 1 diabetes mellitus with ketoacidosis without coma: Secondary | ICD-10-CM | POA: Diagnosis not present

## 2023-08-27 DIAGNOSIS — I8290 Acute embolism and thrombosis of unspecified vein: Secondary | ICD-10-CM

## 2023-08-27 LAB — BASIC METABOLIC PANEL
Anion gap: 9 (ref 5–15)
Anion gap: 9 (ref 5–15)
BUN: 18 mg/dL (ref 6–20)
BUN: 18 mg/dL (ref 6–20)
CO2: 23 mmol/L (ref 22–32)
CO2: 23 mmol/L (ref 22–32)
Calcium: 8.4 mg/dL — ABNORMAL LOW (ref 8.9–10.3)
Calcium: 8.2 mg/dL — ABNORMAL LOW (ref 8.9–10.3)
Chloride: 103 mmol/L (ref 98–111)
Chloride: 103 mmol/L (ref 98–111)
Creatinine, Ser: 0.88 mg/dL (ref 0.61–1.24)
Creatinine, Ser: 0.83 mg/dL (ref 0.61–1.24)
GFR, Estimated: >60 mL/min (ref >60)
GFR, Estimated: >60 mL/min (ref >60)
Glucose, Bld: 177 mg/dL — ABNORMAL HIGH (ref 70–99)
Glucose, Bld: 202 mg/dL — ABNORMAL HIGH (ref 70–99)
Potassium: 3.6 mmol/L (ref 3.5–5.1)
Potassium: 4 mmol/L (ref 3.5–5.1)
Sodium: 135 mmol/L (ref 135–145)
Sodium: 135 mmol/L (ref 135–145)

## 2023-08-27 LAB — CBC
HCT: 38.4 % — ABNORMAL LOW (ref 39.0–52.0)
HCT: 34 % — ABNORMAL LOW (ref 39.0–52.0)
Hemoglobin: 12.3 g/dL — ABNORMAL LOW (ref 13.0–17.0)
Hemoglobin: 11.3 g/dL — ABNORMAL LOW (ref 13.0–17.0)
MCH: 32.7 pg (ref 26.0–34.0)
MCH: 33.2 pg (ref 26.0–34.0)
MCHC: 32 g/dL (ref 30.0–36.0)
MCHC: 33.2 g/dL (ref 30.0–36.0)
MCV: 102.1 fL — ABNORMAL HIGH (ref 80.0–100.0)
MCV: 100 fL (ref 80.0–100.0)
Platelets: 386 10*3/uL (ref 150–400)
Platelets: 359 10*3/uL (ref 150–400)
RBC: 3.76 MIL/uL — ABNORMAL LOW (ref 4.22–5.81)
RBC: 3.4 MIL/uL — ABNORMAL LOW (ref 4.22–5.81)
RDW: 13.1 % (ref 11.5–15.5)
RDW: 13.1 % (ref 11.5–15.5)
WBC: 14.7 10*3/uL — ABNORMAL HIGH (ref 4.0–10.5)
WBC: 13.2 10*3/uL — ABNORMAL HIGH (ref 4.0–10.5)
nRBC: 0 % (ref 0.0–0.2)
nRBC: 0 % (ref 0.0–0.2)

## 2023-08-27 LAB — GLUCOSE, CAPILLARY
Glucose-Capillary: 152 mg/dL — ABNORMAL HIGH (ref 70–99)
Glucose-Capillary: 155 mg/dL — ABNORMAL HIGH (ref 70–99)
Glucose-Capillary: 156 mg/dL — ABNORMAL HIGH (ref 70–99)
Glucose-Capillary: 167 mg/dL — ABNORMAL HIGH (ref 70–99)
Glucose-Capillary: 175 mg/dL — ABNORMAL HIGH (ref 70–99)
Glucose-Capillary: 183 mg/dL — ABNORMAL HIGH (ref 70–99)
Glucose-Capillary: 188 mg/dL — ABNORMAL HIGH (ref 70–99)

## 2023-08-27 LAB — HEMOGLOBIN A1C
Hgb A1c MFr Bld: 12.2 % — ABNORMAL HIGH (ref 4.8–5.6)
Mean Plasma Glucose: 303.44 mg/dL

## 2023-08-27 LAB — BETA-HYDROXYBUTYRIC ACID: Beta-Hydroxybutyric Acid: 1.18 mmol/L — ABNORMAL HIGH (ref 0.05–0.27)

## 2023-08-27 MED ORDER — INSULIN ASPART 100 UNIT/ML FLEXPEN
0.0000 [IU] | PEN_INJECTOR | Freq: Three times a day (TID) | SUBCUTANEOUS | 0 refills | Status: DC
  Filled 2023-08-27: qty 3, 30d supply, fill #0
  Filled 2023-08-27: qty 6, 30d supply, fill #0

## 2023-08-27 MED ORDER — BLOOD GLUCOSE MONITORING SUPPL DEVI: 1.0000 | Freq: Three times a day (TID) | 0 refills | Status: AC

## 2023-08-27 MED ORDER — LANCETS MISC: 1.0000 | Freq: Three times a day (TID) | 0 refills | Status: AC

## 2023-08-27 MED ORDER — ONDANSETRON HCL 4 MG PO TABS
4.0000 mg | ORAL_TABLET | Freq: Four times a day (QID) | ORAL | 0 refills | Status: DC | PRN
  Filled 2023-08-27: qty 20, 5d supply, fill #0

## 2023-08-27 MED ORDER — LANCET DEVICE MISC: 1.0000 | Freq: Three times a day (TID) | 0 refills | Status: AC

## 2023-08-27 MED ORDER — INSULIN ASPART 100 UNIT/ML FLEXPEN: 0.0000 [IU] | PEN_INJECTOR | Freq: Three times a day (TID) | SUBCUTANEOUS | 0 refills | Status: DC

## 2023-08-27 MED ORDER — PANTOPRAZOLE SODIUM 40 MG PO TBEC
40.0000 mg | DELAYED_RELEASE_TABLET | Freq: Every day | ORAL | 1 refills | Status: AC
  Filled 2023-08-27: qty 30, 30d supply, fill #0

## 2023-08-27 MED ORDER — INSULIN ASPART 100 UNIT/ML IJ SOLN: 0.0000 [IU] | Freq: Three times a day (TID) | INTRAMUSCULAR | Status: DC

## 2023-08-27 MED ORDER — INSULIN DETEMIR 100 UNIT/ML ~~LOC~~ SOLN
10.0000 [IU] | Freq: Every day | SUBCUTANEOUS | Status: DC
  Administered 2023-08-27: 10 [IU] via SUBCUTANEOUS
  Filled 2023-08-27: qty 0.1

## 2023-08-27 MED ORDER — BLOOD GLUCOSE TEST VI STRP: 1.0000 | ORAL_STRIP | Freq: Three times a day (TID) | 0 refills | Status: AC

## 2023-08-27 MED ORDER — INSULIN GLARGINE 100 UNIT/ML SOLOSTAR PEN
9.0000 [IU] | PEN_INJECTOR | Freq: Every day | SUBCUTANEOUS | 3 refills | Status: DC
  Filled 2023-08-27: qty 3, 30d supply, fill #0

## 2023-08-27 MED ORDER — INSULIN ASPART 100 UNIT/ML IJ SOLN: 0.0000 [IU] | Freq: Every day | INTRAMUSCULAR | Status: DC

## 2023-08-27 MED ORDER — PEN NEEDLES 31G X 5 MM MISC
1.0000 | Freq: Three times a day (TID) | 0 refills | Status: DC
  Filled 2023-08-27: qty 100, 30d supply, fill #0

## 2023-08-27 NOTE — Inpatient Diabetes Management (Signed)
Met w/ pt at bedside this AM.  Pt up in bed eating breakfast.  I reviewed with pt his last visit (the instructions given) with the ENDO Dr. Katrinka Blazing.  Pt told me he forgot to reduce his Levemir to 9 units per instructions from Dr. Hansel Feinstein been taking Levemir 12 units daily.  Pt told me he has been taking the Novolog 1 unit for every 12 grams of Carbs + Prescribed SSI.  Has the SSI at home.  I told pt that Dr. Katrinka Blazing had instructed him to take 1 unit Novolog for every 15 grams of Carbs and that I would recommend he take the dose that Dr. Katrinka Blazing prescribed.  Only has 1 pen of Levemir at home.  I discussed with pt that the company that makes Levemir will no longer be making the Levemir and that we need to switch him to a different long acting insulin--Pt agreeable.  I told pt I would ask Md to switch him to Lantus once daily and that the RN will go over all his d/c meds when he goes home today.  Pt told me he does not use the Dexcom G6 and does not want to use it--Stated to me his receiver is broken.  I asked pt if he took his receiver to his last ENDO appt to see if they could troubleshoot the issue he is having, however, pt stated he forgot to take it and doesn't want to use it.  Pt told me he has a CBG meter at home and is happy using that for CBGs.  Pt also told me he dos not like any providers with [facility] and wants to switch ENDOs.  I have provided pt with  list of ENDOs with [facility] here in [place] and strongly encouraged pt to call [place] [facility] soon for follow up appt.   --Will follow patient during hospitalization--  Ambrose Finland RN, MSN, CDCES Diabetes Coordinator Inpatient Glycemic Control Team Team Pager: [phone] (8a-5p)

## 2023-08-27 NOTE — Plan of Care (Signed)
  Problem: Education: Goal: Individualized Educational Video(s) Outcome: Progressing   Problem: Cardiac: Goal: Ability to maintain an adequate cardiac output will improve Outcome: Progressing   Problem: Health Behavior/Discharge Planning: Goal: Ability to identify and utilize available resources and services will improve Outcome: Progressing Goal: Ability to manage health-related needs will improve Outcome: Progressing   Problem: Fluid Volume: Goal: Ability to achieve a balanced intake and output will improve Outcome: Progressing   Problem: Metabolic: Goal: Ability to maintain appropriate glucose levels will improve Outcome: Progressing   Problem: Nutritional: Goal: Maintenance of adequate nutrition will improve Outcome: Progressing Goal: Maintenance of adequate weight for body size and type will improve Outcome: Progressing   Problem: Respiratory: Goal: Will regain and/or maintain adequate ventilation Outcome: Progressing   Problem: Urinary Elimination: Goal: Ability to achieve and maintain adequate renal perfusion and functioning will improve Outcome: Progressing   Problem: Education: Goal: Knowledge of General Education information will improve Description: Including pain rating scale, medication(s)/side effects and non-pharmacologic comfort measures Outcome: Progressing   Problem: Health Behavior/Discharge Planning: Goal: Ability to manage health-related needs will improve Outcome: Progressing   Problem: Clinical Measurements: Goal: Ability to maintain clinical measurements within normal limits will improve Outcome: Progressing Goal: Will remain free from infection Outcome: Progressing Goal: Diagnostic test results will improve Outcome: Progressing Goal: Respiratory complications will improve Outcome: Progressing Goal: Cardiovascular complication will be avoided Outcome: Progressing   Problem: Activity: Goal: Risk for activity intolerance will  decrease Outcome: Progressing   Problem: Nutrition: Goal: Adequate nutrition will be maintained Outcome: Progressing   Problem: Coping: Goal: Level of anxiety will decrease Outcome: Progressing   Problem: Elimination: Goal: Will not experience complications related to bowel motility Outcome: Progressing Goal: Will not experience complications related to urinary retention Outcome: Progressing   Problem: Pain Managment: Goal: General experience of comfort will improve Outcome: Progressing   Problem: Safety: Goal: Ability to remain free from injury will improve Outcome: Progressing   Problem: Skin Integrity: Goal: Risk for impaired skin integrity will decrease Outcome: Progressing

## 2023-08-27 NOTE — Plan of Care (Signed)
  Problem: Education: Goal: Ability to describe self-care measures that may prevent or decrease complications (Diabetes Survival Skills Education) will improve Outcome: Progressing Goal: Individualized Educational Video(s) Outcome: Progressing   Problem: Cardiac: Goal: Ability to maintain an adequate cardiac output will improve Outcome: Progressing   Problem: Health Behavior/Discharge Planning: Goal: Ability to identify and utilize available resources and services will improve Outcome: Progressing Goal: Ability to manage health-related needs will improve Outcome: Progressing   

## 2023-08-27 NOTE — Inpatient Diabetes Management (Addendum)
Inpatient Diabetes Program Recommendations  AACE/ADA: New Consensus Statement on Inpatient Glycemic Control (2015)  Target Ranges:  Prepandial:   less than 140 mg/dL      Peak postprandial:   less than 180 mg/dL (1-2 hours)      Critically ill patients:  140 - 180 mg/dL    Latest Reference Range & Units 03/22/23 02:05  Hemoglobin A1C 4.8 - 5.6 % 14.2 (H)  360 mg/dl  (H): Data is abnormally high  Latest Reference Range & Units 08/26/23 09:12 08/27/23 06:27  Beta-Hydroxybutyric Acid 0.05 - 0.27 mmol/L >8.00 (H) 1.18 (H)  (H): Data is abnormally high  Latest Reference Range & Units 08/26/23 09:12  Sodium 135 - 145 mmol/L 125 (L)  Potassium 3.5 - 5.1 mmol/L 5.6 (H)  Chloride 98 - 111 mmol/L 88 (L)  CO2 22 - 32 mmol/L <7 (L)  Glucose 70 - 99 mg/dL >0,981 (HH)  BUN 6 - 20 mg/dL 31 (H)  Creatinine 1.91 - 1.24 mg/dL 4.78 (H)  Calcium 8.9 - 10.3 mg/dL 9.1  Anion gap 5 - 15  NOT CALCULATED    Latest Reference Range & Units 08/26/23 09:02 08/26/23 10:53 08/26/23 12:29 08/26/23 13:40 08/26/23 14:28 08/26/23 15:58 08/26/23 16:58 08/26/23 18:20  Glucose-Capillary 70 - 99 mg/dL >295 (HH) >621 (HH)  IV Insulin Drip Started 560 (HH) 449 (H) 298 (H) 187 (H) 175 (H) 215 (H)    Latest Reference Range & Units 08/26/23 16:58 08/26/23 18:20 08/26/23 19:27 08/26/23 20:47 08/26/23 21:50 08/26/23 22:56 08/27/23 00:10 08/27/23 02:12  Glucose-Capillary 70 - 99 mg/dL 308 (H) 657 (H) 846 (H) 192 (H) 173 (H) 168 (H) 167 (H) 156 (H)    Latest Reference Range & Units 08/27/23 04:12 08/27/23 06:11  Glucose-Capillary 70 - 99 mg/dL 962 (H)  IV Insulin Drip Running 188 (H)  (H): Data is abnormally high   Admit with: DKA (pt told MD last took insulin several days prior to admission--cannot find insulin)  History: Type 1 Diabetes, Schizophrenia, DKA (June 2024)  Home DM Meds: Dexcom G6 CGM       Regular Novolin R 1-9 units        Levemir 8 units QHS  Current Orders: IV Insulin Drip    MD- Note 6:27am  BMET shows the following: Glucose 202/ Anion Gap 9/ CO2 level 23  When you allow pt to transition to SQ Insulin, please consider:  1. Start Semglee 9 units Daily (make sure to continue the IV Insulin Drip for 2 hours after Semglee on board)  2. Start Novolog 0-6 units TID AC + HS (Very Sensitive scale)  3. Start Novolog 3 units TID for meal coverage HOLD if pt NPO HOLD if pt eats <50% meals    Per Chart Review, pt called ENDO office 07/22/2023 requesting Rx for Novolog  which was sent to Pharmacy  ENDO: Dr. Katrinka Blazing with [facility] Last Seen 05/14/2023 A1c at that visit was 13.4% (down from 14.2% in April 2024) Instructions given: Use your Dexcom G6 CGM Take Levemir 9 units Daily Continue Novolog 1 unit for every 15 grams Carbs Continue Novolog SSI: <200 -no insulin 201-250 - ADD 2 UNITS 251-300 - ADD 3 UNITS 301-350 - ADD 3 UNITS 351-400 - ADD 4 UNITS 401-450 - ADD 4 UNITS >451 - ADD 5 UNITS     --Will follow patient during hospitalization--  Ambrose Finland RN, MSN, CDCES Diabetes Coordinator Inpatient Glycemic Control Team Team Pager: [phone] (8a-5p)

## 2023-08-27 NOTE — Discharge Summary (Signed)
Physician Discharge Summary   Patient: Tom Anderson MRN: [id number] DOB: 1971/11/03  Admit date:     08/26/2023  Discharge date: 08/27/23  Discharge Physician: Thad Ranger, MD    PCP: Montez Hageman, DO   Recommendations at discharge:   Start Lantus FlexPen 9 units daily NovoLog FlexPen sensitive sliding scale correction factor and meal coverage (add 1 unit for every 15 g carbohydrates ).  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  He was admitted in 05/2023 with DKA.  He reported on admission that he last took his insulin few days ago as he could not find it.  Also stated that he never really took the Xarelto that he was supposed to be on for his aortic thrombus, denied taking any other prescription medications.  Still smokes cigarettes and marijuana.  Started having nausea vomiting and inability to tolerate p.o. intake since a day before the admission. In ED, heart rate 122, otherwise VSS.  BMET showed sodium 125, potassium 5.6, bicarb less than 7, glucose > 1200, creatinine 1.72.  Alkaline phosphatase 277, AST 74, ALT 83, serum osmolality 356.  WBC 17.9, hemoglobin 13.5. venous blood gas 7.01 03/19/1947  Patient was admitted for DKA   Assessment and Plan:   DKA (diabetic ketoacidosis) (HCC) in the setting of noncompliance, uncontrolled diabetes  mellitus type 2, IDDM -Precipitated due to noncompliance and not taking his insulin. -Patient was placed on IV insulin with Endo tool and IV fluid hydration -Gap has closed, BHB >8.0 on admission, improved to 1.1, transition to subcutaneous insulin. -Started on Levemir 10 units first dose now with sliding scale insulin, carb modified diet  -Hemoglobin A1c 12.2 -Diabetic coordinator consulted.  Placed on Lantus expends, 9 units daily, NovoLog sensitive sliding scale correction factor with meal coverage      AKI (acute kidney injury) (HCC) -Creatinine 1.7 on admission, likely due to dehydration and DKA.  Creatinine was 1.1 on 06/14/2023 -Received fluid hydration, IV insulin -Creatinine has improved to 0.8 today   Hyperkalemia -Potassium 5.6 on admission, likely due to DKA -Improved to 4.0 today   Hyponatremia -Sodium 125, likely pseudohyponatremia due to hyperglycemia -Received IV fluid hydration, sodium improved to 135 today   History of aortic thrombus, in descending thoracic and distal abdominal aorta supposed to be on anticoagulation -Noncompliant, stated that she took her Xarelto only for very short period of time in April however never took it subsequently.   History of chronic systolic CHF -Currently euvolemic.  2D echo in 03/2023 had shown EF of 45 to 50% with G1 DD     Schizophrenia (HCC) -Currently stable, no acute issues       Moderate cannabis use disorder (HCC)    Cigarette nicotine dependence without complication -Counseled on cannabis and smoking cessation     Chronic transaminitis secondary to hepatic steatosis -Noted to have elevated transaminitis, were elevated on the labs in 05/2023 -RUQ ultrasound in  05/2023 had noted increased hepatic echogenicity, steatosis otherwise unremarkable     Moderate protein calorie malnutrition, underweight Estimated body mass index is 17.49 kg/m as calculated from the following:   Height as of this encounter: 5\' 10"  (1.778 m).    Weight as of this encounter: 55.3 kg.   Medical noncompliance, remains high risk for readmissions      Pain control - [facility] Controlled Substance Reporting System database was reviewed. and patient was instructed, not to drive, operate heavy machinery, perform activities at heights, swimming or participation in water activities or provide baby-sitting services while on Pain, Sleep and Anxiety Medications; until their outpatient Physician has advised to do so again. Also recommended to not to take more than prescribed Pain, Sleep and Anxiety Medications.  Consultants:  Procedures performed:   Disposition: Home Diet recommendation:  Discharge Diet Orders (From admission, onward)     Start     Ordered   08/27/23 0000  Diet Carb Modified        08/27/23 1137            DISCHARGE MEDICATION: Allergies as of 08/27/2023       Reactions   Nitrous Oxide Nausea And Vomiting   Latex Rash        Medication List     STOP taking these medications    aspirin 81 MG chewable tablet   feeding supplement (GLUCERNA SHAKE) Liqd   insulin detemir 100 UNIT/ML FlexPen Commonly known as: LEVEMIR   metoCLOPramide 5 MG tablet Commonly known as: Reglan       TAKE these medications    acetaminophen 325 MG tablet Commonly known as: TYLENOL Take 2 tablets (650 mg total) by mouth every 6 (six) hours as needed for mild pain (or Fever >/= 101).   ARIPiprazole 10 MG tablet Commonly known as: ABILIFY Take 1 tablet (10 mg total) by mouth daily. What changed: when to take this   Blood Glucose Monitoring Suppl Devi 1 each by Does not apply route 3 (three) times daily. May dispense any manufacturer covered by patient's insurance.   BLOOD GLUCOSE TEST STRIPS Strp 1 each by Does not apply route 3 (three) times daily. Use as directed to check blood sugar. May dispense any manufacturer covered by patient's insurance and fits patient's device.   Dexcom G6 Sensor Misc Inject 1 Device  into the skin See admin instructions. Place 1 new sensor into the skin every 10 days   folic acid 1 MG tablet Commonly known as: FOLVITE Take 1 tablet (1 mg total) by mouth daily.   insulin aspart 100 UNIT/ML FlexPen Commonly known as: NOVOLOG Inject 0-8 Units into the skin 3 (three) times daily with meals. If not eating, correction dose only. BG <150= 0 unit; BG 150-200= 1 unit; BG 201-250= 2 unit; BG 251-300= 3 unit; BG 301-350= 4 unit; BG 351-400= 5 unit; BG >400= 6 unit and Call Primary Care. If eating, add 1 unit Novolog for every 15gms carbs with the correction factor. What changed:  how much to take how to take this when to take this additional instructions   insulin glargine 100 UNIT/ML Solostar Pen Commonly known as: LANTUS Inject 9 Units into the skin daily. May substitute as needed per insurance.   Lancet Device Misc 1 each by Does not apply route 3 (three) times daily. May dispense any manufacturer covered by patient's insurance.   Lancets Misc 1 each by Does not apply route 3 (three) times daily. Use as directed to  check blood sugar. May dispense any manufacturer covered by patient's insurance and fits patient's device.   nicotine 21 mg/24hr patch Commonly known as: NICODERM CQ - dosed in mg/24 hours Place 1 patch (21 mg total) onto the skin daily.   ondansetron 4 MG tablet Commonly known as: ZOFRAN Take 1 tablet (4 mg total) by mouth every 6 (six) hours as needed for nausea.   pantoprazole 40 MG tablet Commonly known as: PROTONIX Take 1 tablet (40 mg total) by mouth daily. What changed: when to take this   Pen Needles 31G X 5 MM Misc 1 each by Does not apply route 3 (three) times daily. May dispense any manufacturer covered by patient's insurance.   rivaroxaban 20 MG Tabs tablet Commonly known as: XARELTO Take 1 tablet (20 mg total) by mouth daily with supper. Take this when the xarelto starter pack is completed.   traZODone 50 MG tablet Commonly known as:  DESYREL Take 1 tablet (50 mg total) by mouth at bedtime.   Ventolin HFA 108 (90 Base) MCG/ACT inhaler Generic drug: albuterol Inhale 2 puffs into the lungs every 4 (four) hours as needed for wheezing or shortness of breath.        Follow-up Information     Montez Hageman, DO. Schedule an appointment as soon as possible for a visit in 2 week(s).   Specialty: Family Medicine Why: for hospital follow-up Contact information: [address] [place] [address] [phone]                Discharge Exam: [name] Weights   08/26/23 0918 08/26/23 1352  Weight: 56.7 kg 55.3 kg   S: Wants to go home today, no acute complaints.  Transitioned to subcutaneous insulin.   BP (!) 143/101   Pulse 96   Temp 98.1 F (36.7 C) (Oral)   Resp (!) 24   Ht 5\' 10"  (1.778 m)   Wt 55.3 kg   SpO2 (!) 84%   BMI 17.49 kg/m   Physical Exam General: Alert and oriented x 3, NAD Cardiovascular: S1 S2 clear, RRR.  Respiratory: CTAB, no wheezing, rales or rhonchi Gastrointestinal: Soft, nontender, nondistended, NBS Ext: no pedal edema bilaterally Neuro: no new deficits Psych: Normal affect    Condition at discharge: fair  The results of significant diagnostics from this hospitalization (including imaging, microbiology, ancillary and laboratory) are listed below for reference.   Imaging Studies: No results found.  Microbiology: Results for orders placed or performed during the hospital encounter of 08/26/23  SARS Coronavirus 2 by RT PCR (hospital order, performed in [facility] hospital lab) *cepheid single result test* Anterior Nasal Swab     Status: None   Collection Time: 08/26/23  9:12 AM   Specimen: Anterior Nasal Swab  Result Value Ref Range Status   SARS Coronavirus 2 by RT PCR NEGATIVE NEGATIVE Final    Comment: (NOTE) SARS-CoV-2 target nucleic acids are NOT DETECTED.  The SARS-CoV-2 RNA is generally detectable in upper and lower respiratory specimens during the acute  phase of infection. The lowest concentration of SARS-CoV-2 viral copies this assay can detect is 250 copies / mL. A negative result does not preclude SARS-CoV-2 infection and should not be used as the sole basis for treatment or other patient management decisions.  A negative result may occur with improper specimen collection / handling, submission of specimen other than nasopharyngeal swab, presence of viral mutation(s) within the areas targeted by this assay, and inadequate number of viral copies (<250 copies /  mL). A negative result must be combined with clinical observations, patient history, and epidemiological information.  Fact Sheet for Patients:   RoadLapTop.co.za  Fact Sheet for Healthcare Providers: http://kim-miller.com/  This test is not yet approved or  cleared by the [place] FDA and has been authorized for detection and/or diagnosis of SARS-CoV-2 by FDA under an Emergency Use Authorization (EUA).  This EUA will remain in effect (meaning this test can be used) for the duration of the COVID-19 declaration under Section 564(b)(1) of the Act, 21 U.S.C. section 360bbb-3(b)(1), unless the authorization is terminated or revoked sooner.  Performed at [facility], [address] [place], [place] [address]     Labs: CBC: Recent Labs  Lab 08/26/23 0912 08/26/23 0915 08/26/23 1128 08/27/23 0114 08/27/23 0627  WBC 17.9*  --   --  14.7* 13.2*  NEUTROABS 13.6*  --   --   --   --   HGB 13.5 16.0 13.6 12.3* 11.3*  HCT 44.1 47.0 40.0 38.4* 34.0*  MCV 107.0*  --   --  102.1* 100.0  PLT 496*  --   --  386 359   Basic Metabolic Panel: Recent Labs  Lab 08/26/23 0912 08/26/23 0915 08/26/23 1128 08/26/23 1804 08/26/23 2156 08/27/23 0114 08/27/23 0627  NA 125*   < > 130* 137 136 135 135  K 5.6*   < > 5.0 4.2 3.9 3.6 4.0  CL 88*  --   --  104 104 103 103  CO2 <7*  --   --  18* 21* 23 23  GLUCOSE >1,200*   --   --  213* 189* 177* 202*  BUN 31*  --   --  24* 21* 18 18  CREATININE 1.72*  --   --  0.98 0.94 0.88 0.83  CALCIUM 9.1  --   --  8.5* 8.3* 8.4* 8.2*   < > = values in this interval not displayed.   Liver Function Tests: Recent Labs  Lab 08/26/23 0912  AST 74*  ALT 83*  ALKPHOS 277*  BILITOT 1.5*  PROT 8.4*  ALBUMIN 4.7   CBG: Recent Labs  Lab 08/27/23 0412 08/27/23 0611 08/27/23 0727 08/27/23 0830 08/27/23 0930  GLUCAP 152* 188* 155* 183* 175*    Discharge time spent: greater than 30 minutes.  Signed: Thad Ranger, MD [facility] 08/27/2023

## 2023-08-27 NOTE — Discharge Instructions (Signed)
[facility] [phone]

## 2023-08-27 NOTE — Progress Notes (Signed)
[facility]                                                                              Tom Anderson, is a 51 y.o. male, DOB - [birth year], ZSW:[id number] Admit date - 08/26/2023    Outpatient Primary MD for the patient is Montez Hageman, DO  LOS - 1  days  Chief Complaint  Patient presents with   Hyperglycemia       Brief summary   Patient is a 52 year old male with schizophrenia, medication noncompliance, aortic thrombus, supposed to be on Xarelto however not taking it, chronic systolic CHF presented with recurrent DKA.  He was admitted in 05/2023 with DKA.  He reported on admission that he last took his insulin few days ago as he could not find it.  Also stated that he never really took the Xarelto that he was supposed to be on for his aortic thrombus, denied taking any other prescription medications.  Still smokes cigarettes and marijuana.  Started having nausea vomiting and inability to tolerate p.o. intake since a day before the admission. In ED, heart rate 122, otherwise VSS.  BMET showed sodium 125, potassium 5.6, bicarb less than 7, glucose > 1200, creatinine 1.72.  Alkaline phosphatase 277, AST 74, ALT 83, serum osmolality 356.  WBC 17.9, hemoglobin 13.5. venous blood gas 7.01 03[birth year]19/1947  Patient was admitted for DKA  Assessment & Plan    Principal Problem:   DKA (diabetic ketoacidosis) (HCC) in the setting of noncompliance, uncontrolled diabetes mellitus type 2, IDDM -Precipitated due to noncompliance and not taking his insulin. -Patient was placed on IV insulin with Endo tool and IV fluid hydration -Gap has closed, BHB >8.0 on admission, improved to 1.1 this morning, no nausea or vomiting, CO2 23, will transition to subcu insulin. -Started on Levemir 10 units first dose now with sliding scale insulin, carb modified diet  -Hemoglobin A1c 12.2 -Diabetic coordinator consulted  Active Problems:   AKI (acute kidney injury) (HCC) -Creatinine 1.7 on  admission, likely due to dehydration and DKA.  Creatinine was 1.1 on 06/14/2023 -Received fluid hydration, IV insulin -Creatinine has improved to 0.8 today  Hyperkalemia -Potassium 5.6 on admission, likely due to DKA -Improved to 4.0 today  Hyponatremia -Sodium 125, likely pseudohyponatremia due to hyperglycemia -Received IV fluid hydration, sodium improved to 135 today  History of aortic thrombus, in descending thoracic and distal abdominal aorta supposed to be on anticoagulation -Noncompliant, stated that she took her Xarelto only for very short period of time in April however never took it subsequently.  History of chronic systolic CHF -Currently euvolemic.  2D echo in 03/2023 had shown EF of 45 to 50% with G1 DD -Will DC IV fluids    Schizophrenia (HCC) -Currently stable, no acute issues     Moderate cannabis use disorder (HCC)    Cigarette nicotine dependence without complication -Counseled on cannabis and smoking cessation   Chronic transaminitis -Noted to have elevated transaminitis, were elevated on the labs in 05/2023 -RUQ ultrasound in 05/2023 had noted increased hepatic echogenicity, steatosis otherwise unremarkable   Moderate protein calorie malnutrition, underweight Estimated body mass index is 17.49  kg/m as calculated from the following:   Height as of this encounter: 5\' 10"  (1.778 m).   Weight as of this encounter: 55.3 kg.  Code Status: Full CODE STATUS DVT Prophylaxis:  enoxaparin (LOVENOX) injection 40 mg Start: 08/26/23 2200 SCDs Start: 08/26/23 1323   Level of Care: Level of care: Stepdown Family Communication: Updated patient Disposition Plan:      Remains inpatient appropriate: Possible DC home today   Procedures:    Consultants:     Antimicrobials:   Anti-infectives (From admission, onward)    None          Medications  Chlorhexidine Gluconate Cloth  6 each Topical Q0600   enoxaparin (LOVENOX) injection  40 mg Subcutaneous QHS    insulin aspart  0-5 Units Subcutaneous QHS   insulin aspart  0-9 Units Subcutaneous TID WC   insulin detemir  10 Units Subcutaneous Daily   pantoprazole  40 mg Oral Daily      Subjective:   Tom Anderson was seen and examined today.  No acute complaints, feels better, wants to eat.  Still on IV insulin drip at the time of my encounter this morning.  Patient denies dizziness, chest pain, shortness of breath, abdominal pain, N/V. No acute events overnight.    Objective:   Vitals:   08/27/23 0600 08/27/23 0700 08/27/23 0753 08/27/23 0800  BP: 117/83 104/65  (!) 143/101  Pulse: 94 83 93 96  Resp: 14 (!) 21 13 (!) 24  Temp:   98.1 F (36.7 C)   TempSrc:   Oral   SpO2: 93% 97% 95% (!) 84%  Weight:      Height:        Intake/Output Summary (Last 24 hours) at 08/27/2023 0947 Last data filed at 08/27/2023 0800 Gross per 24 hour  Intake 5470.44 ml  Output 1100 ml  Net 4370.44 ml     Wt Readings from Last 3 Encounters:  08/26/23 55.3 kg  06/14/23 63.5 kg  05/21/23 49.4 kg     Exam General: Alert and oriented x 3, NAD Cardiovascular: S1 S2 auscultated,  RRR Respiratory: Clear to auscultation bilaterally, no wheezing Gastrointestinal: Soft, nontender, nondistended, + bowel sounds Ext: no pedal edema bilaterally Neuro: Strength 5/5 upper and lower extremities bilaterally Psych: Normal affect     Data Reviewed:  I have personally reviewed following labs    CBC Lab Results  Component Value Date   WBC 13.2 (H) 08/27/2023   RBC 3.40 (L) 08/27/2023   HGB 11.3 (L) 08/27/2023   HCT 34.0 (L) 08/27/2023   MCV 100.0 08/27/2023   MCH 33.2 08/27/2023   PLT 359 08/27/2023   MCHC 33.2 08/27/2023   RDW 13.1 08/27/2023   LYMPHSABS 2.1 08/26/2023   MONOABS 1.4 (H) 08/26/2023   EOSABS 0.0 08/26/2023   BASOSABS 0.2 (H) 08/26/2023     Last metabolic panel Lab Results  Component Value Date   NA 135 08/27/2023   K 4.0 08/27/2023   CL 103 08/27/2023   CO2 23 08/27/2023    BUN 18 08/27/2023   CREATININE 0.83 08/27/2023   GLUCOSE 202 (H) 08/27/2023   GFRNONAA >60 08/27/2023   CALCIUM 8.2 (L) 08/27/2023   PHOS 3.7 05/02/2023   PROT 8.4 (H) 08/26/2023   ALBUMIN 4.7 08/26/2023   BILITOT 1.5 (H) 08/26/2023   ALKPHOS 277 (H) 08/26/2023   AST 74 (H) 08/26/2023   ALT 83 (H) 08/26/2023   ANIONGAP 9 08/27/2023    CBG (last 3)  Recent Labs  08/27/23 0727 08/27/23 0830 08/27/23 0930  GLUCAP 155* 183* 175*      Coagulation Profile: No results for input(s): "INR", "PROTIME" in the last 168 hours.   Radiology Studies: I have personally reviewed the imaging studies  No results found.     Thad Ranger M.D. [facility] 08/27/2023, 9:47 AM  Available via Epic secure chat 7am-7pm After 7 pm, please refer to night coverage provider listed on amion.

## 2023-09-04 ENCOUNTER — Other Ambulatory Visit: Payer: Self-pay

## 2023-09-04 ENCOUNTER — Encounter (HOSPITAL_BASED_OUTPATIENT_CLINIC_OR_DEPARTMENT_OTHER): Payer: Self-pay | Admitting: Pediatrics

## 2023-09-04 ENCOUNTER — Observation Stay (HOSPITAL_BASED_OUTPATIENT_CLINIC_OR_DEPARTMENT_OTHER)
Admission: EM | Admit: 2023-09-04 | Discharge: 2023-09-05 | Disposition: A | Discharge status: Home / Self Care | Payer: MEDICAID | Attending: Emergency Medicine | Admitting: Emergency Medicine

## 2023-09-04 DIAGNOSIS — E875 Hyperkalemia: Secondary | ICD-10-CM | POA: Insufficient documentation

## 2023-09-04 DIAGNOSIS — Z794 Long term (current) use of insulin: Secondary | ICD-10-CM | POA: Insufficient documentation

## 2023-09-04 DIAGNOSIS — I7411 Embolism and thrombosis of thoracic aorta: Secondary | ICD-10-CM | POA: Insufficient documentation

## 2023-09-04 DIAGNOSIS — Z681 Body mass index (BMI) 19 or less, adult: Secondary | ICD-10-CM | POA: Diagnosis not present

## 2023-09-04 DIAGNOSIS — I110 Hypertensive heart disease with heart failure: Secondary | ICD-10-CM | POA: Insufficient documentation

## 2023-09-04 DIAGNOSIS — Z7901 Long term (current) use of anticoagulants: Secondary | ICD-10-CM | POA: Diagnosis not present

## 2023-09-04 DIAGNOSIS — E1165 Type 2 diabetes mellitus with hyperglycemia: Secondary | ICD-10-CM

## 2023-09-04 DIAGNOSIS — N179 Acute kidney failure, unspecified: Secondary | ICD-10-CM | POA: Diagnosis not present

## 2023-09-04 DIAGNOSIS — Z79899 Other long term (current) drug therapy: Secondary | ICD-10-CM | POA: Diagnosis not present

## 2023-09-04 DIAGNOSIS — E1110 Type 2 diabetes mellitus with ketoacidosis without coma: Principal | ICD-10-CM | POA: Diagnosis present

## 2023-09-04 DIAGNOSIS — F17210 Nicotine dependence, cigarettes, uncomplicated: Secondary | ICD-10-CM | POA: Insufficient documentation

## 2023-09-04 DIAGNOSIS — Z91040 Latex allergy status: Secondary | ICD-10-CM | POA: Insufficient documentation

## 2023-09-04 DIAGNOSIS — E1010 Type 1 diabetes mellitus with ketoacidosis without coma: Secondary | ICD-10-CM | POA: Diagnosis not present

## 2023-09-04 DIAGNOSIS — R7401 Elevation of levels of liver transaminase levels: Secondary | ICD-10-CM | POA: Insufficient documentation

## 2023-09-04 DIAGNOSIS — I7409 Other arterial embolism and thrombosis of abdominal aorta: Secondary | ICD-10-CM | POA: Diagnosis not present

## 2023-09-04 DIAGNOSIS — I8290 Acute embolism and thrombosis of unspecified vein: Secondary | ICD-10-CM | POA: Diagnosis present

## 2023-09-04 DIAGNOSIS — E871 Hypo-osmolality and hyponatremia: Secondary | ICD-10-CM | POA: Insufficient documentation

## 2023-09-04 DIAGNOSIS — R636 Underweight: Secondary | ICD-10-CM | POA: Insufficient documentation

## 2023-09-04 DIAGNOSIS — J45909 Unspecified asthma, uncomplicated: Secondary | ICD-10-CM | POA: Insufficient documentation

## 2023-09-04 DIAGNOSIS — K760 Fatty (change of) liver, not elsewhere classified: Secondary | ICD-10-CM | POA: Insufficient documentation

## 2023-09-04 DIAGNOSIS — E440 Moderate protein-calorie malnutrition: Secondary | ICD-10-CM | POA: Diagnosis present

## 2023-09-04 DIAGNOSIS — R42 Dizziness and giddiness: Secondary | ICD-10-CM | POA: Diagnosis present

## 2023-09-04 DIAGNOSIS — F209 Schizophrenia, unspecified: Secondary | ICD-10-CM | POA: Diagnosis present

## 2023-09-04 DIAGNOSIS — I5022 Chronic systolic (congestive) heart failure: Secondary | ICD-10-CM | POA: Diagnosis not present

## 2023-09-04 DIAGNOSIS — F1910 Other psychoactive substance abuse, uncomplicated: Secondary | ICD-10-CM | POA: Diagnosis present

## 2023-09-04 DIAGNOSIS — E1065 Type 1 diabetes mellitus with hyperglycemia: Secondary | ICD-10-CM | POA: Diagnosis not present

## 2023-09-04 LAB — I-STAT VENOUS BLOOD GAS, ED
Acid-base deficit: 20 mmol/L — ABNORMAL HIGH (ref 0.0–2.0)
Bicarbonate: 7.3 mmol/L — ABNORMAL LOW (ref 20.0–28.0)
Calcium, Ion: 1.06 mmol/L — ABNORMAL LOW (ref 1.15–1.40)
HCT: 45 % (ref 39.0–52.0)
Hemoglobin: 15.3 g/dL (ref 13.0–17.0)
O2 Saturation: 81 %
Patient temperature: 95.6
Potassium: 5.1 mmol/L (ref 3.5–5.1)
Sodium: 119 mmol/L — CL (ref 135–145)
TCO2: 8 mmol/L — ABNORMAL LOW (ref 22–32)
pCO2, Ven: 20.1 mm[Hg] — ABNORMAL LOW (ref 44–60)
pH, Ven: 7.161 — CL (ref 7.25–7.43)
pO2, Ven: 52 mm[Hg] — ABNORMAL HIGH (ref 32–45)

## 2023-09-04 LAB — BASIC METABOLIC PANEL
Anion gap: 29 — ABNORMAL HIGH (ref 5–15)
Anion gap: 18 — ABNORMAL HIGH (ref 5–15)
Anion gap: 12 (ref 5–15)
BUN: 34 mg/dL — ABNORMAL HIGH (ref 6–20)
BUN: 31 mg/dL — ABNORMAL HIGH (ref 6–20)
BUN: 26 mg/dL — ABNORMAL HIGH (ref 6–20)
CO2: 10 mmol/L — ABNORMAL LOW (ref 22–32)
CO2: 21 mmol/L — ABNORMAL LOW (ref 22–32)
CO2: 24 mmol/L (ref 22–32)
Calcium: 9.4 mg/dL (ref 8.9–10.3)
Calcium: 9.7 mg/dL (ref 8.9–10.3)
Calcium: 8.8 mg/dL — ABNORMAL LOW (ref 8.9–10.3)
Chloride: 87 mmol/L — ABNORMAL LOW (ref 98–111)
Chloride: 93 mmol/L — ABNORMAL LOW (ref 98–111)
Chloride: 93 mmol/L — ABNORMAL LOW (ref 98–111)
Creatinine, Ser: 1.73 mg/dL — ABNORMAL HIGH (ref 0.61–1.24)
Creatinine, Ser: 1.39 mg/dL — ABNORMAL HIGH (ref 0.61–1.24)
Creatinine, Ser: 1.02 mg/dL (ref 0.61–1.24)
GFR, Estimated: 47 mL/min — ABNORMAL LOW (ref >60)
GFR, Estimated: >60 mL/min (ref >60)
GFR, Estimated: >60 mL/min (ref >60)
Glucose, Bld: >1200 mg/dL (ref 70–99)
Glucose, Bld: 338 mg/dL — ABNORMAL HIGH (ref 70–99)
Glucose, Bld: 169 mg/dL — ABNORMAL HIGH (ref 70–99)
Potassium: 4.4 mmol/L (ref 3.5–5.1)
Potassium: 3.8 mmol/L (ref 3.5–5.1)
Potassium: 3.6 mmol/L (ref 3.5–5.1)
Sodium: 126 mmol/L — ABNORMAL LOW (ref 135–145)
Sodium: 132 mmol/L — ABNORMAL LOW (ref 135–145)
Sodium: 129 mmol/L — ABNORMAL LOW (ref 135–145)

## 2023-09-04 LAB — COMPREHENSIVE METABOLIC PANEL
ALT: 76 U/L — ABNORMAL HIGH (ref 0–44)
AST: 52 U/L — ABNORMAL HIGH (ref 15–41)
Albumin: 4.4 g/dL (ref 3.5–5.0)
Alkaline Phosphatase: 233 U/L — ABNORMAL HIGH (ref 38–126)
BUN: 40 mg/dL — ABNORMAL HIGH (ref 6–20)
CO2: <7 mmol/L — ABNORMAL LOW (ref 22–32)
Calcium: 9.4 mg/dL (ref 8.9–10.3)
Chloride: 79 mmol/L — ABNORMAL LOW (ref 98–111)
Creatinine, Ser: 2.05 mg/dL — ABNORMAL HIGH (ref 0.61–1.24)
GFR, Estimated: 39 mL/min — ABNORMAL LOW (ref >60)
Glucose, Bld: >1200 mg/dL (ref 70–99)
Potassium: 5.2 mmol/L — ABNORMAL HIGH (ref 3.5–5.1)
Sodium: 122 mmol/L — ABNORMAL LOW (ref 135–145)
Total Bilirubin: 1.9 mg/dL — ABNORMAL HIGH (ref 0.3–1.2)
Total Protein: 7.5 g/dL (ref 6.5–8.1)

## 2023-09-04 LAB — GLUCOSE, CAPILLARY
Glucose-Capillary: 138 mg/dL — ABNORMAL HIGH (ref 70–99)
Glucose-Capillary: 141 mg/dL — ABNORMAL HIGH (ref 70–99)
Glucose-Capillary: 146 mg/dL — ABNORMAL HIGH (ref 70–99)
Glucose-Capillary: 149 mg/dL — ABNORMAL HIGH (ref 70–99)
Glucose-Capillary: 169 mg/dL — ABNORMAL HIGH (ref 70–99)
Glucose-Capillary: 196 mg/dL — ABNORMAL HIGH (ref 70–99)
Glucose-Capillary: 202 mg/dL — ABNORMAL HIGH (ref 70–99)
Glucose-Capillary: 263 mg/dL — ABNORMAL HIGH (ref 70–99)
Glucose-Capillary: 366 mg/dL — ABNORMAL HIGH (ref 70–99)
Glucose-Capillary: 403 mg/dL — ABNORMAL HIGH (ref 70–99)

## 2023-09-04 LAB — CBC WITH DIFFERENTIAL/PLATELET
Abs Immature Granulocytes: 0.8 10*3/uL — ABNORMAL HIGH (ref 0.00–0.07)
Basophils Absolute: 0.2 10*3/uL — ABNORMAL HIGH (ref 0.0–0.1)
Basophils Relative: 1 %
Eosinophils Absolute: 0 10*3/uL (ref 0.0–0.5)
Eosinophils Relative: 0 %
HCT: 41.1 % (ref 39.0–52.0)
Hemoglobin: 13 g/dL (ref 13.0–17.0)
Immature Granulocytes: 5 %
Lymphocytes Relative: 12 %
Lymphs Abs: 2.1 10*3/uL (ref 0.7–4.0)
MCH: 33.5 pg (ref 26.0–34.0)
MCHC: 31.6 g/dL (ref 30.0–36.0)
MCV: 105.9 fL — ABNORMAL HIGH (ref 80.0–100.0)
Monocytes Absolute: 0.9 10*3/uL (ref 0.1–1.0)
Monocytes Relative: 6 %
Neutro Abs: 13 10*3/uL — ABNORMAL HIGH (ref 1.7–7.7)
Neutrophils Relative %: 76 %
Platelets: 468 10*3/uL — ABNORMAL HIGH (ref 150–400)
RBC: 3.88 MIL/uL — ABNORMAL LOW (ref 4.22–5.81)
RDW: 13.3 % (ref 11.5–15.5)
WBC: 17.2 10*3/uL — ABNORMAL HIGH (ref 4.0–10.5)
nRBC: 0 % (ref 0.0–0.2)

## 2023-09-04 LAB — URINALYSIS, ROUTINE W REFLEX MICROSCOPIC
Bilirubin Urine: NEGATIVE
Glucose, UA: >=500 mg/dL — AB
Hgb urine dipstick: NEGATIVE
Ketones, ur: >=80 mg/dL — AB
Leukocytes,Ua: NEGATIVE
Nitrite: NEGATIVE
Protein, ur: NEGATIVE mg/dL
Specific Gravity, Urine: 1.02 (ref 1.005–1.030)
pH: 5 (ref 5.0–8.0)

## 2023-09-04 LAB — CBG MONITORING, ED
Glucose-Capillary: >600 mg/dL (ref 70–99)
Glucose-Capillary: >600 mg/dL (ref 70–99)
Glucose-Capillary: >600 mg/dL (ref 70–99)
Glucose-Capillary: >600 mg/dL (ref 70–99)
Glucose-Capillary: >600 mg/dL (ref 70–99)
Glucose-Capillary: 528 mg/dL (ref 70–99)
Glucose-Capillary: >600 mg/dL (ref 70–99)

## 2023-09-04 LAB — URINALYSIS, MICROSCOPIC (REFLEX)

## 2023-09-04 LAB — BETA-HYDROXYBUTYRIC ACID: Beta-Hydroxybutyric Acid: >8 mmol/L — ABNORMAL HIGH (ref 0.05–0.27)

## 2023-09-04 MED ORDER — LACTATED RINGERS IV SOLN: INTRAVENOUS | Status: DC

## 2023-09-04 MED ORDER — OXYCODONE HCL 5 MG PO TABS: 5.0000 mg | ORAL_TABLET | ORAL | Status: DC | PRN

## 2023-09-04 MED ORDER — LACTATED RINGERS IV BOLUS
1000.0000 mL | Freq: Once | INTRAVENOUS | Status: AC
  Administered 2023-09-04: 1000 mL via INTRAVENOUS

## 2023-09-04 MED ORDER — INSULIN REGULAR(HUMAN) IN NACL 100-0.9 UT/100ML-% IV SOLN
INTRAVENOUS | Status: DC
  Administered 2023-09-04: 8 [IU]/h via INTRAVENOUS
  Filled 2023-09-04: qty 100

## 2023-09-04 MED ORDER — ACETAMINOPHEN 325 MG PO TABS: 650.0000 mg | ORAL_TABLET | Freq: Four times a day (QID) | ORAL | Status: DC | PRN

## 2023-09-04 MED ORDER — DEXTROSE 50 % IV SOLN: 0.0000 mL | INTRAVENOUS | Status: DC | PRN

## 2023-09-04 MED ORDER — DEXTROSE IN LACTATED RINGERS 5 % IV SOLN: INTRAVENOUS | Status: DC

## 2023-09-04 MED ORDER — ONDANSETRON HCL 4 MG/2ML IJ SOLN: 4.0000 mg | Freq: Four times a day (QID) | INTRAMUSCULAR | Status: DC | PRN

## 2023-09-04 MED ORDER — ACETAMINOPHEN 650 MG RE SUPP: 650.0000 mg | Freq: Four times a day (QID) | RECTAL | Status: DC | PRN

## 2023-09-04 MED ORDER — POTASSIUM CHLORIDE 10 MEQ/100ML IV SOLN
10.0000 meq | INTRAVENOUS | Status: AC
  Administered 2023-09-04 (×2): 10 meq via INTRAVENOUS
  Filled 2023-09-04 (×2): qty 100

## 2023-09-04 MED ORDER — LACTATED RINGERS IV BOLUS
20.0000 mL/kg | Freq: Once | INTRAVENOUS | Status: AC
  Administered 2023-09-04: 1106 mL via INTRAVENOUS

## 2023-09-04 MED ORDER — DEXTROSE IN LACTATED RINGERS 5 % IV SOLN
INTRAVENOUS | Status: DC
  Administered 2023-09-05: 125 mL/h via INTRAVENOUS

## 2023-09-04 MED ORDER — ENOXAPARIN SODIUM 40 MG/0.4ML IJ SOSY
40.0000 mg | PREFILLED_SYRINGE | INTRAMUSCULAR | Status: DC
  Administered 2023-09-04: 40 mg via SUBCUTANEOUS
  Filled 2023-09-04: qty 0.4

## 2023-09-04 MED ORDER — INSULIN REGULAR(HUMAN) IN NACL 100-0.9 UT/100ML-% IV SOLN: INTRAVENOUS | Status: DC

## 2023-09-04 MED ORDER — MORPHINE SULFATE (PF) 4 MG/ML IV SOLN
4.0000 mg | Freq: Once | INTRAVENOUS | Status: AC
  Administered 2023-09-04: 4 mg via INTRAVENOUS
  Filled 2023-09-04: qty 1

## 2023-09-04 MED ORDER — ONDANSETRON HCL 4 MG PO TABS: 4.0000 mg | ORAL_TABLET | Freq: Four times a day (QID) | ORAL | Status: DC | PRN

## 2023-09-04 MED ORDER — ARIPIPRAZOLE 10 MG PO TABS: 10.0000 mg | ORAL_TABLET | ORAL | Status: DC

## 2023-09-04 NOTE — H&P (Addendum)
History and Physical    Tom Anderson WUJ:[id number] DOB: [birth year] DOA: 09/04/2023  PCP: Montez Hageman, DO   Chief Complaint: feeling poorly  HPI: Tom Anderson is a 52 y.o. male with medical history significant of poorly controlled type 1 diabetes, schizophrenia, polysubstance abuse, CHF who presented to the emergency department due to feeling ill, nausea, abd pain.  He states that he thought he was in diabetic ketoacidosis as he feels similar to prior presentations.  He said his blood sugar this morning was 260 felt fine yesterday.  He took some ecstasy yesterday.  On arrival he was tachycardic in the 110s and tachypneic.  Labs were obtained which were glucose greater than 600, beta hydroxybutyrate greater than 8, WBC 17.2, hemoglobin 13, platelets 468, sodium 122, potassium 5.2, bicarb undetectably low, glucose 1200, creatinine 2.05, AST 52, ALT 76, total bilirubin 1.9, alkaline phosphatase 233, venous blood gas showed pH 7.16, bicarb 20.1, urinalysis revealed glucosuria.  Patient was started on insulin drip and transferred to [facility] for further assessment.  On evaluation he states he was tired and somnolent.  He has had multiple similar presentations.  Most recent 1 was less than 2 weeks ago at which time he presented with nausea vomiting poor p.o. intake found to have diabetic ketoacidosis.  He was treated on insulin drip.  He is also found to have an AKI which improved with treatment of his underlying acidosis.  Patient anticoagulation denies compliance with his Xarelto.   Review of Systems: Review of Systems  Constitutional:  Negative for chills and fever.  HENT: Negative.    Eyes: Negative.   Respiratory: Negative.    Cardiovascular: Negative.   Gastrointestinal:  Positive for abdominal pain and nausea.  Genitourinary: Negative.   Musculoskeletal: Negative.   Skin: Negative.   Neurological: Negative.   Endo/Heme/Allergies: Negative.   Psychiatric/Behavioral: Negative.    All  other systems reviewed and are negative.    As per HPI otherwise 10 point review of systems negative.   Allergies  Allergen Reactions   Nitrous Oxide Nausea And Vomiting   Latex Rash    Past Medical History:  Diagnosis Date   Asthma    Depression    Diabetes mellitus without complication (HCC)    Schizophrenia (HCC)    Uses self-applied continuous glucose monitoring device     Past Surgical History:  Procedure Laterality Date   HERNIA REPAIR       reports that he has been smoking cigarettes. He has never used smokeless tobacco. He reports current alcohol use. He reports current drug use. Drug: Marijuana.  Family History  Problem Relation Age of Onset   Diabetes Maternal Grandfather     Prior to Admission medications   Medication Sig Start Date End Date Taking? Authorizing Provider  acetaminophen (TYLENOL) 325 MG tablet Take 2 tablets (650 mg total) by mouth every 6 (six) hours as needed for mild pain (or Fever >/= 101). 05/22/23   Meredeth Ide, MD  ARIPiprazole (ABILIFY) 10 MG tablet Take 1 tablet (10 mg total) by mouth daily. Patient taking differently: Take 10 mg by mouth 3 (three) times a week. 03/23/23   Rai, Delene Ruffini, MD  Blood Glucose Monitoring Suppl DEVI 1 each by Does not apply route 3 (three) times daily. May dispense any manufacturer covered by patient's insurance. 08/27/23   Rai, Delene Ruffini, MD  Continuous Glucose Sensor (DEXCOM G6 SENSOR) MISC Inject 1 Device into the skin See admin instructions. Place 1 new sensor into the  skin every 10 days 05/15/23   [provider]  folic acid (FOLVITE) 1 MG tablet Take 1 tablet (1 mg total) by mouth daily. 03/23/23   Rai, Ripudeep K, MD  Glucose Blood (BLOOD GLUCOSE TEST STRIPS) STRP 1 each by Does not apply route 3 (three) times daily. Use as directed to check blood sugar. May dispense any manufacturer covered by patient's insurance and fits patient's device. 08/27/23   Rai, Ripudeep K, MD  insulin aspart (NOVOLOG)  100 UNIT/ML FlexPen Inject 0-8 Units into the skin 3 (three) times daily with meals. If not eating, correction dose only. BG <150= 0 unit; BG 150-200= 1 unit; BG 201-250= 2 unit; BG 251-300= 3 unit; BG 301-350= 4 unit; BG 351-400= 5 unit; BG >400= 6 unit and Call Primary Care. If eating, add 1 unit Novolog for every 15gms carbs with the correction factor. 08/27/23   Rai, Ripudeep K, MD  insulin glargine (LANTUS) 100 UNIT/ML Solostar Pen Inject 9 Units into the skin daily. May substitute as needed per insurance. 08/27/23   Rai, Ripudeep K, MD  Insulin Pen Needle (PEN NEEDLES) 31G X 5 MM MISC Use to administer insuline 3 (three) times daily. 08/27/23   Rai, Delene Ruffini, MD  Lancet Device MISC 1 each by Does not apply route 3 (three) times daily. May dispense any manufacturer covered by patient's insurance. 08/27/23   Rai, Delene Ruffini, MD  Lancets MISC 1 each by Does not apply route 3 (three) times daily. Use as directed to check blood sugar. May dispense any manufacturer covered by patient's insurance and fits patient's device. 08/27/23   Rai, Ripudeep K, MD  nicotine (NICODERM CQ - DOSED IN MG/24 HOURS) 21 mg/24hr patch Place 1 patch (21 mg total) onto the skin daily. Patient not taking: Reported on 05/21/2023 05/03/23   Burnadette Pop, MD  ondansetron (ZOFRAN) 4 MG tablet Take 1 tablet (4 mg total) by mouth every 6 (six) hours as needed for nausea. 08/27/23   Rai, Ripudeep K, MD  pantoprazole (PROTONIX) 40 MG tablet Take 1 tablet (40 mg total) by mouth daily. 08/27/23   Rai, Delene Ruffini, MD  rivaroxaban (XARELTO) 20 MG TABS tablet Take 1 tablet (20 mg total) by mouth daily with supper. Take this when the xarelto starter pack is completed. Patient not taking: Reported on 05/21/2023 04/12/23   Rai, Delene Ruffini, MD  traZODone (DESYREL) 50 MG tablet Take 1 tablet (50 mg total) by mouth at bedtime. Patient not taking: Reported on 05/21/2023 03/24/23   Russella Dar, NP  VENTOLIN HFA 108 (90 Base) MCG/ACT inhaler Inhale 2  puffs into the lungs every 4 (four) hours as needed for wheezing or shortness of breath. 05/22/23   Meredeth Ide, MD    Physical Exam: Vitals:   09/04/23 1300 09/04/23 1315 09/04/23 1330 09/04/23 1400  BP: 118/88  123/88 (!) 142/91  Pulse: (!) 117  (!) 117 (!) 118  Resp: 20  20 18   Temp: 98.4 F (36.9 C) 98.5 F (36.9 C) 98.7 F (37.1 C) 98.8 F (37.1 C)  TempSrc:      SpO2: 98%  95% 100%  Weight:      Height:       Physical Exam Constitutional:      Appearance: He is normal weight.  HENT:     Head: Normocephalic.     Nose: Nose normal.     Mouth/Throat:     Mouth: Mucous membranes are moist.     Pharynx: Oropharynx is  clear.  Eyes:     Conjunctiva/sclera: Conjunctivae normal.     Pupils: Pupils are equal, round, and reactive to light.  Cardiovascular:     Rate and Rhythm: Normal rate and regular rhythm.     Pulses: Normal pulses.     Heart sounds: Normal heart sounds.  Pulmonary:     Effort: Pulmonary effort is normal.     Breath sounds: Normal breath sounds.  Abdominal:     General: Abdomen is flat. Bowel sounds are normal.  Musculoskeletal:        General: Normal range of motion.     Cervical back: Normal range of motion.  Skin:    General: Skin is warm.     Capillary Refill: Capillary refill takes less than 2 seconds.  Neurological:     General: No focal deficit present.     Mental Status: He is alert. Mental status is at baseline.  Psychiatric:        Mood and Affect: Mood normal.        Labs on Admission: I have personally reviewed the patients's labs and imaging studies.  Assessment/Plan Principal Problem:   DKA, type 2 (HCC)   DKA (diabetic ketoacidosis) (HCC) in the setting of noncompliance, uncontrolled diabetes mellitus type 1, IDDM -patient endorses compliance -Patient was placed on IV insulin with Endo tool and IV fluid hydration -BHB >8.0 on admission -Previously on lantus 10 units and humalog -Hemoglobin A1c 12       AKI (acute  kidney injury) (HCC) -Creatinine 2.0 , likely due to dehydration and DKA. Baseline 1 -Received fluid hydration, IV insulin   Hyperkalemia -Potassium 5.2 on admission, likely due to DKA   Hyponatremia -Sodium 122, likely pseudohyponatremia due to hyperglycemia -Received IV fluid hydration   History of aortic thrombus, in descending thoracic and distal abdominal aorta supposed to be on anticoagulation -Noncompliant, stated that she took her Xarelto only for very short period of time in April however never took it subsequently.   History of chronic systolic CHF -Currently euvolemic.  2D echo in 03/2023 had shown EF of 45 to 50%     Schizophrenia (HCC) -Currently stable, no acute issues       Moderate cannabis use disorder (HCC)    Cigarette nicotine dependence without complication -Counseled on cannabis and smoking cessation     Chronic transaminitis secondary to hepatic steatosis -Noted to have elevated transaminitis, were elevated on the labs in 05/2023 -RUQ ultrasound in 05/2023 had noted increased hepatic echogenicity, steatosis otherwise unremarkable     Moderate protein calorie malnutrition, underweight Estimated body mass index is 17.49 kg/m as calculated from the following:   Height as of this encounter: 5\' 10"  (1.778 m).   Weight as of this encounter: 55.3 kg.   Admission status: Observation Stepdown  Certification: The appropriate patient status for this patient is OBSERVATION. Observation status is judged to be reasonable and necessary in order to provide the required intensity of service to ensure the patient's safety. The patient's presenting symptoms, physical exam findings, and initial radiographic and laboratory data in the context of their medical condition is felt to place them at decreased risk for further clinical deterioration. Furthermore, it is anticipated that the patient will be medically stable for discharge from the hospital within 2 midnights of  admission.     Alan Mulder MD [facility] If 7PM-7AM, please contact night-coverage [email or web address]  09/04/2023, 4:04 PM

## 2023-09-04 NOTE — ED Notes (Signed)
Patient stated "I took a massive amount of ecstasy" when asked how much stated "like 5" when asked when, stated "yesterday, mid day".

## 2023-09-04 NOTE — Progress Notes (Signed)
Plan of Care Note for accepted transfer   Patient: Tom Anderson MRN: [id number]   DOA: 09/04/2023  Facility requesting transfer: [facility].  Requesting Provider: Gwyneth Sprout, MD Reason for transfer: DKA type 2. Facility course:  DKA type 2 with AKI.  IV hydration/DKA treatment protocol started.  Plan of care: The patient is accepted for admission to [facility] unit, at [facility].   Author: Bobette Mo, MD 09/04/2023  Check [email or web address] for on-call coverage.  Nursing staff, Please call [facility] Admits & Consults System-Wide number on Amion as soon as patient's arrival, so appropriate admitting provider can evaluate the pt.

## 2023-09-04 NOTE — ED Provider Notes (Signed)
[place] [facility] AT [facility] Provider Note   CSN: [id number] Arrival date & time: 09/04/23  1012     History  Chief Complaint  Patient presents with   " Feels bad"   Dizziness    Tom Anderson is a 52 y.o. male.  Patient with history of poorly controlled diabetes, schizophrenia, polysubstance abuse, aortic thrombus, CHF presents today with complaints of feeling generally unwell. He states he thinks he is in DKA as this is how he normally feels. He states that he has not been taking his medications. He did check is sugar this morning and noted it was 265. He states that he felt fine yesterday and then took 5 pills of ectasy last night 'just because' and then woke up feeling this way. He notes nausea with a few episodes of vomiting. Denies chest pain or shortness of breath.  The history is provided by the patient. No language interpreter was used.  Dizziness      Home Medications Prior to Admission medications   Medication Sig Start Date End Date Taking? Authorizing Provider  acetaminophen (TYLENOL) 325 MG tablet Take 2 tablets (650 mg total) by mouth every 6 (six) hours as needed for mild pain (or Fever >/= 101). 05/22/23   Meredeth Ide, MD  ARIPiprazole (ABILIFY) 10 MG tablet Take 1 tablet (10 mg total) by mouth daily. Patient taking differently: Take 10 mg by mouth 3 (three) times a week. 03/23/23   Rai, Delene Ruffini, MD  Blood Glucose Monitoring Suppl DEVI 1 each by Does not apply route 3 (three) times daily. May dispense any manufacturer covered by patient's insurance. 08/27/23   Rai, Delene Ruffini, MD  Continuous Glucose Sensor (DEXCOM G6 SENSOR) MISC Inject 1 Device into the skin See admin instructions. Place 1 new sensor into the skin every 10 days 05/15/23   [provider]  folic acid (FOLVITE) 1 MG tablet Take 1 tablet (1 mg total) by mouth daily. 03/23/23   Rai, Ripudeep K, MD  Glucose Blood (BLOOD GLUCOSE TEST STRIPS) STRP 1 each by Does not  apply route 3 (three) times daily. Use as directed to check blood sugar. May dispense any manufacturer covered by patient's insurance and fits patient's device. 08/27/23   Rai, Ripudeep K, MD  insulin aspart (NOVOLOG) 100 UNIT/ML FlexPen Inject 0-8 Units into the skin 3 (three) times daily with meals. If not eating, correction dose only. BG <150= 0 unit; BG 150-200= 1 unit; BG 201-250= 2 unit; BG 251-300= 3 unit; BG 301-350= 4 unit; BG 351-400= 5 unit; BG >400= 6 unit and Call Primary Care. If eating, add 1 unit Novolog for every 15gms carbs with the correction factor. 08/27/23   Rai, Ripudeep K, MD  insulin glargine (LANTUS) 100 UNIT/ML Solostar Pen Inject 9 Units into the skin daily. May substitute as needed per insurance. 08/27/23   Rai, Ripudeep K, MD  Insulin Pen Needle (PEN NEEDLES) 31G X 5 MM MISC Use to administer insuline 3 (three) times daily. 08/27/23   Rai, Delene Ruffini, MD  Lancet Device MISC 1 each by Does not apply route 3 (three) times daily. May dispense any manufacturer covered by patient's insurance. 08/27/23   Rai, Delene Ruffini, MD  Lancets MISC 1 each by Does not apply route 3 (three) times daily. Use as directed to check blood sugar. May dispense any manufacturer covered by patient's insurance and fits patient's device. 08/27/23   Rai, Delene Ruffini, MD  nicotine (NICODERM CQ - DOSED IN  MG/24 HOURS) 21 mg/24hr patch Place 1 patch (21 mg total) onto the skin daily. Patient not taking: Reported on 05/21/2023 05/03/23   Burnadette Pop, MD  ondansetron (ZOFRAN) 4 MG tablet Take 1 tablet (4 mg total) by mouth every 6 (six) hours as needed for nausea. 08/27/23   Rai, Ripudeep K, MD  pantoprazole (PROTONIX) 40 MG tablet Take 1 tablet (40 mg total) by mouth daily. 08/27/23   Rai, Delene Ruffini, MD  rivaroxaban (XARELTO) 20 MG TABS tablet Take 1 tablet (20 mg total) by mouth daily with supper. Take this when the xarelto starter pack is completed. Patient not taking: Reported on 05/21/2023 04/12/23   Rai, Delene Ruffini, MD  traZODone (DESYREL) 50 MG tablet Take 1 tablet (50 mg total) by mouth at bedtime. Patient not taking: Reported on 05/21/2023 03/24/23   Russella Dar, NP  VENTOLIN HFA 108 (90 Base) MCG/ACT inhaler Inhale 2 puffs into the lungs every 4 (four) hours as needed for wheezing or shortness of breath. 05/22/23   Meredeth Ide, MD      Allergies    Nitrous oxide and Latex    Review of Systems   Review of Systems  All other systems reviewed and are negative.   Physical Exam Updated Vital Signs BP 132/79 (BP Location: Right Arm)   Pulse (!) 115   Temp (!) 94.3 F (34.6 C)   Resp (!) 24   Ht 5\' 10"  (1.778 m)   Wt 55.3 kg   SpO2 100%   BMI 17.49 kg/m  Physical Exam Vitals and nursing note reviewed.  Constitutional:      General: He is not in acute distress.    Appearance: Normal appearance. He is normal weight. He is not ill-appearing, toxic-appearing or diaphoretic.     Comments: Chronically ill appearing in no acute distress  HENT:     Head: Normocephalic and atraumatic.  Cardiovascular:     Rate and Rhythm: Tachycardia present.  Pulmonary:     Effort: Pulmonary effort is normal. No respiratory distress.  Abdominal:     General: Abdomen is flat.     Palpations: Abdomen is soft.     Tenderness: There is no abdominal tenderness.  Musculoskeletal:        General: Normal range of motion.     Cervical back: Normal range of motion.  Skin:    General: Skin is warm and dry.  Neurological:     General: No focal deficit present.     Mental Status: He is alert.  Psychiatric:        Mood and Affect: Mood normal.        Behavior: Behavior normal.     ED Results / Procedures / Treatments   Labs (all labs ordered are listed, but only abnormal results are displayed) Labs Reviewed  URINALYSIS, ROUTINE W REFLEX MICROSCOPIC - Abnormal; Notable for the following components:      Result Value   Glucose, UA >=500 (*)    Ketones, ur >=80 (*)    All other components within normal  limits  CBC WITH DIFFERENTIAL/PLATELET - Abnormal; Notable for the following components:   WBC 17.2 (*)    RBC 3.88 (*)    MCV 105.9 (*)    Platelets 468 (*)    Neutro Abs 13.0 (*)    Basophils Absolute 0.2 (*)    Abs Immature Granulocytes 0.80 (*)    All other components within normal limits  COMPREHENSIVE METABOLIC PANEL - Abnormal; Notable for  the following components:   Sodium 122 (*)    Potassium 5.2 (*)    Chloride 79 (*)    CO2 <7 (*)    Glucose, Bld >1,200 (*)    BUN 40 (*)    Creatinine, Ser 2.05 (*)    AST 52 (*)    ALT 76 (*)    Alkaline Phosphatase 233 (*)    Total Bilirubin 1.9 (*)    GFR, Estimated 39 (*)    All other components within normal limits  URINALYSIS, MICROSCOPIC (REFLEX) - Abnormal; Notable for the following components:   Bacteria, UA RARE (*)    All other components within normal limits  CBG MONITORING, ED - Abnormal; Notable for the following components:   Glucose-Capillary >600 (*)    All other components within normal limits  CBG MONITORING, ED - Abnormal; Notable for the following components:   Glucose-Capillary >600 (*)    All other components within normal limits  I-STAT VENOUS BLOOD GAS, ED - Abnormal; Notable for the following components:   pH, Ven 7.161 (*)    pCO2, Ven 20.1 (*)    pO2, Ven 52 (*)    Bicarbonate 7.3 (*)    TCO2 8 (*)    Acid-base deficit 20.0 (*)    Sodium 119 (*)    Calcium, Ion 1.06 (*)    All other components within normal limits  CBG MONITORING, ED - Abnormal; Notable for the following components:   Glucose-Capillary >600 (*)    All other components within normal limits  CBG MONITORING, ED - Abnormal; Notable for the following components:   Glucose-Capillary >600 (*)    All other components within normal limits  BETA-HYDROXYBUTYRIC ACID  BETA-HYDROXYBUTYRIC ACID    EKG EKG Interpretation Date/Time:  Saturday September 04 2023 10:35:05 EDT Ventricular Rate:  106 PR Interval:  146 QRS Duration:  95 QT  Interval:  329 QTC Calculation: 437 R Axis:   93  Text Interpretation: Sinus tachycardia Right atrial enlargement Borderline right axis deviation ST elev, probable normal early repol pattern No significant change since last tracing Confirmed by Gwyneth Sprout ([id number]) on 09/04/2023 10:36:00 AM  Radiology No results found.  Procedures .Critical Care  Performed by: Silva Bandy, PA-C Authorized by: Silva Bandy, PA-C   Critical care provider statement:    Critical care time (minutes):  35   Critical care was necessary to treat or prevent imminent or life-threatening deterioration of the following conditions:  Endocrine crisis   Critical care was time spent personally by me on the following activities:  Development of treatment plan with patient or surrogate, discussions with primary provider, evaluation of patient's response to treatment, examination of patient, obtaining history from patient or surrogate, ordering and review of laboratory studies, ordering and review of radiographic studies, re-evaluation of patient's condition, pulse oximetry and review of old charts   Care discussed with: admitting provider       Medications Ordered in ED Medications  insulin regular, human (MYXREDLIN) 100 units/ 100 mL infusion (8 Units/hr Intravenous Infusion Verify 09/04/23 1237)  lactated ringers infusion (has no administration in time range)  dextrose 5 % in lactated ringers infusion (has no administration in time range)  dextrose 50 % solution 0-50 mL (has no administration in time range)  lactated ringers bolus 1,000 mL (0 mLs Intravenous Stopped 09/04/23 1141)  lactated ringers bolus 1,000 mL (1,000 mLs Intravenous New Bag/Given 09/04/23 1141)    ED Course/ Medical Decision Making/ A&P  Medical Decision Making Amount and/or Complexity of Data Reviewed Labs: ordered.   This patient is a 52 y.o. male who presents to the ED for concern of feeling unwell,  this involves an extensive number of treatment options, and is a complaint that carries with it a high risk of complications and morbidity. The emergent differential diagnosis prior to evaluation includes, but is not limited to,  sepsis, DKA/HHS . This is not an exhaustive differential.   Past Medical History / Co-morbidities / Social History:  has a past medical history of Asthma, Depression, Diabetes mellitus without complication (HCC), Schizophrenia (HCC), and Uses self-applied continuous glucose monitoring device.  Additional history: Chart reviewed. Pertinent results include: patient with several recent admissions for DKA   Physical Exam: Physical exam performed. The pertinent findings include: chronically ill appearing, alert and oriented  Lab Tests: I ordered, and personally interpreted labs.  The pertinent results include:  WBC 17.2, Na 122 adjusted normal. Glucose >1,200, potassium 5.2, chloride 79, bicarb <7, BUN 40, creatinine 2.05, vbg 7.161   Cardiac Monitoring:  The patient was maintained on a cardiac monitor.  My attending physician Dr. Anitra Lauth viewed and interpreted the cardiac monitored which showed an underlying rhythm of: no STEMI. I agree with this interpretation.   Medications: I ordered medication including fluids and insulin for DKA. Reevaluation of the patient after these medicines showed that the patient improved. I have reviewed the patients home medicines and have made adjustments as needed.    Disposition: After consideration of the diagnostic results and the patients response to treatment, I feel that patient will require admission for DKA. Discussed with patient who is understanding and in agreement with this plan.    This is a shared visit with supervising physician Dr. Anitra Lauth who has independently evaluated patient & provided guidance in evaluation/management/disposition, in agreement with care   Final Clinical Impression(s) / ED Diagnoses Final  diagnoses:  Diabetic ketoacidosis without coma associated with type 2 diabetes mellitus [facility])    Rx / DC Orders ED Discharge Orders     None         Vear Clock 09/04/23 1326    Gwyneth Sprout, MD 09/04/23 1501

## 2023-09-04 NOTE — ED Notes (Signed)
Attempted report x1, receiving RN unavailable.  

## 2023-09-04 NOTE — ED Notes (Signed)
Fall risk armband Fall risk socks Fall risk sign on door

## 2023-09-04 NOTE — ED Triage Notes (Signed)
C/O "feels bad" reports he is type 1 diabetic and has some generalized pain, weakness and dizziness.

## 2023-09-04 NOTE — ED Notes (Signed)
Called [facility] for transport spoke with Selena Batten @ 1:45pm

## 2023-09-05 DIAGNOSIS — E1010 Type 1 diabetes mellitus with ketoacidosis without coma: Secondary | ICD-10-CM | POA: Diagnosis not present

## 2023-09-05 DIAGNOSIS — E875 Hyperkalemia: Secondary | ICD-10-CM | POA: Insufficient documentation

## 2023-09-05 LAB — GLUCOSE, CAPILLARY
Glucose-Capillary: 135 mg/dL — ABNORMAL HIGH (ref 70–99)
Glucose-Capillary: 135 mg/dL — ABNORMAL HIGH (ref 70–99)
Glucose-Capillary: 137 mg/dL — ABNORMAL HIGH (ref 70–99)
Glucose-Capillary: 138 mg/dL — ABNORMAL HIGH (ref 70–99)
Glucose-Capillary: 138 mg/dL — ABNORMAL HIGH (ref 70–99)
Glucose-Capillary: 140 mg/dL — ABNORMAL HIGH (ref 70–99)
Glucose-Capillary: 141 mg/dL — ABNORMAL HIGH (ref 70–99)
Glucose-Capillary: 164 mg/dL — ABNORMAL HIGH (ref 70–99)
Glucose-Capillary: 204 mg/dL — ABNORMAL HIGH (ref 70–99)
Glucose-Capillary: 212 mg/dL — ABNORMAL HIGH (ref 70–99)
Glucose-Capillary: 246 mg/dL — ABNORMAL HIGH (ref 70–99)

## 2023-09-05 LAB — BASIC METABOLIC PANEL
Anion gap: 10 (ref 5–15)
Anion gap: 11 (ref 5–15)
Anion gap: 10 (ref 5–15)
BUN: 21 mg/dL — ABNORMAL HIGH (ref 6–20)
BUN: 21 mg/dL — ABNORMAL HIGH (ref 6–20)
BUN: 18 mg/dL (ref 6–20)
CO2: 24 mmol/L (ref 22–32)
CO2: 25 mmol/L (ref 22–32)
CO2: 27 mmol/L (ref 22–32)
Calcium: 8.4 mg/dL — ABNORMAL LOW (ref 8.9–10.3)
Calcium: 8.4 mg/dL — ABNORMAL LOW (ref 8.9–10.3)
Calcium: 8.3 mg/dL — ABNORMAL LOW (ref 8.9–10.3)
Chloride: 96 mmol/L — ABNORMAL LOW (ref 98–111)
Chloride: 97 mmol/L — ABNORMAL LOW (ref 98–111)
Chloride: 94 mmol/L — ABNORMAL LOW (ref 98–111)
Creatinine, Ser: 0.76 mg/dL (ref 0.61–1.24)
Creatinine, Ser: 0.76 mg/dL (ref 0.61–1.24)
Creatinine, Ser: 0.78 mg/dL (ref 0.61–1.24)
GFR, Estimated: >60 mL/min (ref >60)
GFR, Estimated: >60 mL/min (ref >60)
GFR, Estimated: >60 mL/min (ref >60)
Glucose, Bld: 477 mg/dL — ABNORMAL HIGH (ref 70–99)
Glucose, Bld: 486 mg/dL — ABNORMAL HIGH (ref 70–99)
Glucose, Bld: 158 mg/dL — ABNORMAL HIGH (ref 70–99)
Potassium: 3.5 mmol/L (ref 3.5–5.1)
Potassium: 4.2 mmol/L (ref 3.5–5.1)
Potassium: 3.3 mmol/L — ABNORMAL LOW (ref 3.5–5.1)
Sodium: 130 mmol/L — ABNORMAL LOW (ref 135–145)
Sodium: 133 mmol/L — ABNORMAL LOW (ref 135–145)
Sodium: 131 mmol/L — ABNORMAL LOW (ref 135–145)

## 2023-09-05 LAB — BETA-HYDROXYBUTYRIC ACID: Beta-Hydroxybutyric Acid: 0.81 mmol/L — ABNORMAL HIGH (ref 0.05–0.27)

## 2023-09-05 LAB — MRSA NEXT GEN BY PCR, NASAL: MRSA by PCR Next Gen: NOT DETECTED

## 2023-09-05 MED ORDER — LACTATED RINGERS IV SOLN: INTRAVENOUS | Status: DC

## 2023-09-05 MED ORDER — CHLORHEXIDINE GLUCONATE CLOTH 2 % EX PADS: 6.0000 | MEDICATED_PAD | Freq: Every day | CUTANEOUS | Status: DC

## 2023-09-05 MED ORDER — PEN NEEDLES 31G X 5 MM MISC: 1.0000 | Freq: Three times a day (TID) | 0 refills | Status: AC

## 2023-09-05 MED ORDER — INSULIN ASPART 100 UNIT/ML IJ SOLN
3.0000 [IU] | Freq: Three times a day (TID) | INTRAMUSCULAR | Status: DC
  Administered 2023-09-05: 3 [IU] via SUBCUTANEOUS

## 2023-09-05 MED ORDER — INSULIN GLARGINE-YFGN 100 UNIT/ML ~~LOC~~ SOLN
9.0000 [IU] | SUBCUTANEOUS | Status: DC
  Administered 2023-09-05: 9 [IU] via SUBCUTANEOUS
  Filled 2023-09-05: qty 0.09

## 2023-09-05 MED ORDER — INSULIN ASPART 100 UNIT/ML FLEXPEN: 3.0000 [IU] | PEN_INJECTOR | Freq: Three times a day (TID) | SUBCUTANEOUS | 3 refills | Status: DC

## 2023-09-05 MED ORDER — INSULIN DETEMIR 100 UNIT/ML ~~LOC~~ SOLN
10.0000 [IU] | Freq: Every day | SUBCUTANEOUS | Status: DC
  Filled 2023-09-05: qty 0.1

## 2023-09-05 MED ORDER — INSULIN ASPART 100 UNIT/ML IJ SOLN: 0.0000 [IU] | Freq: Three times a day (TID) | INTRAMUSCULAR | Status: DC

## 2023-09-05 MED ORDER — INSULIN ASPART 100 UNIT/ML IJ SOLN
0.0000 [IU] | Freq: Three times a day (TID) | INTRAMUSCULAR | Status: DC
  Administered 2023-09-05: 5 [IU] via SUBCUTANEOUS

## 2023-09-05 MED ORDER — ORAL CARE MOUTH RINSE: 15.0000 mL | OROMUCOSAL | Status: DC | PRN

## 2023-09-05 MED ORDER — INSULIN ASPART 100 UNIT/ML IJ SOLN: 0.0000 [IU] | Freq: Every day | INTRAMUSCULAR | Status: DC

## 2023-09-05 NOTE — Plan of Care (Signed)
  Problem: Health Behavior/Discharge Planning: Goal: Ability to manage health-related needs will improve Outcome: Adequate for Discharge   Problem: Nutritional: Goal: Maintenance of adequate weight for body size and type will improve Outcome: Adequate for Discharge   Problem: Education: Goal: Knowledge of General Education information will improve Description: Including pain rating scale, medication(s)/side effects and non-pharmacologic comfort measures Outcome: Adequate for Discharge   Problem: Coping: Goal: Ability to adjust to condition or change in health will improve Outcome: Adequate for Discharge   Problem: Fluid Volume: Goal: Ability to maintain a balanced intake and output will improve Outcome: Adequate for Discharge   Problem: Health Behavior/Discharge Planning: Goal: Ability to manage health-related needs will improve Outcome: Adequate for Discharge   Problem: Metabolic: Goal: Ability to maintain appropriate glucose levels will improve Outcome: Adequate for Discharge   Problem: Nutritional: Goal: Progress toward achieving an optimal weight will improve Outcome: Adequate for Discharge   Problem: Education: Goal: Ability to describe self-care measures that may prevent or decrease complications (Diabetes Survival Skills Education) will improve Outcome: Not Met (add Reason) Note: Patient refuses to admit he has issues with his diabetes Goal: Individualized Educational Video(s) Outcome: Not Met (add Reason) Note: Patient refuses to admit he has issues with his diabetes

## 2023-09-05 NOTE — Discharge Summary (Signed)
Physician Discharge Summary   Patient: Tom Anderson MRN: [id number] DOB: 1971/02/17  Admit date:     09/04/2023  Discharge date: 09/05/23  Discharge Physician: Alberteen Sam   PCP: Montez Hageman, DO     Recommendations at discharge:  Follow up with PCP in 1 week for DKA Recommend re-referral to Vascular surgery for aortic thrombus      Discharge Diagnoses: Principal Problem:   DKA (diabetic ketoacidosis) (HCC) Active Problems:   AKI (acute kidney injury) (HCC)   Uncontrolled type 2 diabetes mellitus with hyperglycemia, with long-term current use of insulin (HCC)   Schizophrenia (HCC)   Malnutrition of moderate degree   Polysubstance abuse (HCC)   Hyponatremia   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 M with schizophrenia, polysubstance abuse, poorly controlled DM, hx aortic thrombus nonadherent to Xarelto and sCHF EF 40-45% who presented with malaise, nausea and abdominal pain after using MDMA.  In the ER, glucose 1200, Cr 2.05, VBG pH 7.16.  Started on insulin drip and admitted to stepdown.  7th admission for DKA since Feb.     Diabetic ketoacidosis Patient was admitted and started on insulin drip.  Overnight serial metabolic panel showed closing of his anion gap, he was transitioned to subcutaneous insulin, felt well, tolerated normal diet, and glucoses were in the low 200s.  Discharged with refills of his insulin.  Recommended PCP follow-up.   Acute kidney injury Creatinine 2.05 on admission, resolved to 0.7 with fluids overnight.   Polysubstance abuse Cessation recommended   Aortic thrombus Patient reports that he took this for a very short time in April after diagnosis, never took it subsequently.  I see that he had an appointment with vascular surgery scheduled in May, and did not  come to this appointment, nor to the follow up CTA scheduled around that time.  Risks of embolization were discussed with patient.               The [facility] Controlled Substances Registry was reviewed for this patient prior to discharge.   Consultants: None Procedures performed: None  Disposition: Home Diet recommendation:  Cardiac and Carb modified diet  DISCHARGE MEDICATION: Allergies as of 09/05/2023       Reactions   Nitrous Oxide Nausea And Vomiting   Latex Rash        Medication List     STOP taking these medications    Lantus SoloStar 100 UNIT/ML Solostar Pen Generic drug: insulin glargine       TAKE these medications    acetaminophen 325 MG tablet Commonly known as: TYLENOL Take 2 tablets (650 mg total) by mouth every 6 (six) hours as needed for mild pain (or Fever >/= 101).   ARIPiprazole 10 MG tablet Commonly known as: ABILIFY Take 1 tablet (10 mg total) by mouth daily.   Blood Glucose Monitoring Suppl Devi 1 each by Does not apply route 3 (three) times daily. May dispense any manufacturer covered by patient's insurance.   BLOOD GLUCOSE TEST STRIPS Strp 1 each by Does not apply route 3 (three) times daily. Use as directed to check blood sugar. May dispense any manufacturer covered by patient's insurance and fits patient's device.   Dexcom G6 Sensor Misc Inject 1 Device into the skin See admin instructions. Place 1 new sensor into the skin every  10 days   folic acid 1 MG tablet Commonly known as: FOLVITE Take 1 tablet (1 mg total) by mouth daily.   insulin aspart 100 UNIT/ML FlexPen Commonly known as: NOVOLOG Inject 3 Units into the skin 3 (three) times daily with meals. Do not take if you do not eat a meal. What changed:  how much to take additional instructions Another medication with the same name was removed. Continue taking this medication, and follow the directions you see here.   Lancet Device Misc 1 each by Does not apply  route 3 (three) times daily. May dispense any manufacturer covered by patient's insurance.   Lancets Misc 1 each by Does not apply route 3 (three) times daily. Use as directed to check blood sugar. May dispense any manufacturer covered by patient's insurance and fits patient's device.   Levemir 100 UNIT/ML injection Generic drug: insulin detemir Inject 12 Units into the skin 2 (two) times daily.   nicotine 21 mg/24hr patch Commonly known as: NICODERM CQ - dosed in mg/24 hours Place 1 patch (21 mg total) onto the skin daily.   ondansetron 4 MG tablet Commonly known as: ZOFRAN Take 1 tablet (4 mg total) by mouth every 6 (six) hours as needed for nausea.   pantoprazole 40 MG tablet Commonly known as: PROTONIX Take 1 tablet (40 mg total) by mouth daily.   Pen Needles 31G X 5 MM Misc 1 each by Does not apply route 3 (three) times daily. May dispense any manufacturer covered by patient's insurance. What changed: additional instructions   rivaroxaban 20 MG Tabs tablet Commonly known as: XARELTO Take 1 tablet (20 mg total) by mouth daily with supper. Take this when the xarelto starter pack is completed.   traZODone 50 MG tablet Commonly known as: DESYREL Take 1 tablet (50 mg total) by mouth at bedtime.   Ventolin HFA 108 (90 Base) MCG/ACT inhaler Generic drug: albuterol Inhale 2 puffs into the lungs every 4 (four) hours as needed for wheezing or shortness of breath.        Follow-up Information     Montez Hageman, DO. Schedule an appointment as soon as possible for a visit in 1 week(s).   Specialty: Family Medicine Contact information: [phone] [place] [address] [phone]                 Discharge Instructions     Discharge instructions   Complete by: As directed    You were admitted with DKA  You were treated with insulin and this got better  Take your Levemir twice daily   Take Novolog three times daily with meals  For now, take 3 units  with each meal  Do not take the Novolog if you do not eat  Go see your primary doctor in 1 week  Stop using drugs, none of the insulin will work if you use drugs   Increase activity slowly   Complete by: As directed        Discharge Exam: Filed Weights   09/04/23 1018  Weight: 55.3 kg    General: Pt is alert, awake, not in acute distress Cardiovascular: RRR, nl S1-S2, no murmurs appreciated.   No LE edema.   Respiratory: Normal respiratory rate and rhythm.  CTAB without rales or wheezes. Abdominal: Abdomen soft and non-tender.  No distension or HSM.   Neuro/Psych: Strength symmetric in upper and lower extremities.  Judgment and insight appear normal.   Condition at discharge: fair  The  results of significant diagnostics from this hospitalization (including imaging, microbiology, ancillary and laboratory) are listed below for reference.   Imaging Studies: No results found.  Microbiology: Results for orders placed or performed during the hospital encounter of 09/04/23  MRSA Next Gen by PCR, Nasal     Status: None   Collection Time: 09/05/23  8:41 AM   Specimen: Nasal Mucosa; Nasal Swab  Result Value Ref Range Status   MRSA by PCR Next Gen NOT DETECTED NOT DETECTED Final    Comment: (NOTE) The GeneXpert MRSA Assay (FDA approved for NASAL specimens only), is one component of a comprehensive MRSA colonization surveillance program. It is not intended to diagnose MRSA infection nor to guide or monitor treatment for MRSA infections. Test performance is not FDA approved in patients less than 51 years old. Performed at [facility], [address], [place], [place] [address]     Labs: CBC: Recent Labs  Lab 09/04/23 1032 09/04/23 1059  WBC 17.2*  --   NEUTROABS 13.0*  --   HGB 13.0 15.3  HCT 41.1 45.0  MCV 105.9*  --   PLT 468*  --    Basic Metabolic Panel: Recent Labs  Lab 09/04/23 1616 09/04/23 2012 09/04/23 2341 09/05/23 0249  09/05/23 0733  NA 132* 129* 130* 133* 131*  K 3.8 3.6 3.5 4.2 3.3*  CL 93* 93* 96* 97* 94*  CO2 21* 24 24 25 27   GLUCOSE 338* 169* 477* 486* 158*  BUN 31* 26* 21* 21* 18  CREATININE 1.39* 1.02 0.76 0.76 0.78  CALCIUM 9.7 8.8* 8.4* 8.4* 8.3*   Liver Function Tests: Recent Labs  Lab 09/04/23 1032  AST 52*  ALT 76*  ALKPHOS 233*  BILITOT 1.9*  PROT 7.5  ALBUMIN 4.4   CBG: Recent Labs  Lab 09/05/23 0656 09/05/23 0751 09/05/23 0957 09/05/23 1104 09/05/23 1203  GLUCAP 164* 141* 246* 212* 204*    Discharge time spent: approximately 35 minutes spent on discharge counseling, evaluation of patient on day of discharge, and coordination of discharge planning with nursing, social work, pharmacy and case management  Signed: Alberteen Sam, MD [facility] 09/05/2023

## 2023-09-05 NOTE — Hospital Course (Addendum)
Tom Anderson is a 52 y.o. M with schizophrenia, polysubstance abuse, poorly controlled DM, hx aortic thrombus nonadherent to Xarelto and sCHF EF 40-45% who presented with malaise, nausea and abdominal pain after using MDMA.  In the ER, glucose 1200, Cr 2.05, VBG pH 7.16.  Started on insulin drip and admitted to stepdown.  7th admission for DKA since Feb.

## 2023-09-27 ENCOUNTER — Encounter (HOSPITAL_BASED_OUTPATIENT_CLINIC_OR_DEPARTMENT_OTHER): Payer: Self-pay

## 2023-09-27 ENCOUNTER — Other Ambulatory Visit: Payer: Self-pay

## 2023-09-27 ENCOUNTER — Observation Stay (HOSPITAL_BASED_OUTPATIENT_CLINIC_OR_DEPARTMENT_OTHER)
Admission: EM | Admit: 2023-09-27 | Discharge: 2023-09-28 | Disposition: A | Discharge status: Home / Self Care | Payer: MEDICAID | Attending: Pulmonary Disease | Admitting: Pulmonary Disease

## 2023-09-27 ENCOUNTER — Emergency Department (HOSPITAL_BASED_OUTPATIENT_CLINIC_OR_DEPARTMENT_OTHER): Payer: MEDICAID

## 2023-09-27 DIAGNOSIS — R471 Dysarthria and anarthria: Secondary | ICD-10-CM | POA: Diagnosis present

## 2023-09-27 DIAGNOSIS — E1010 Type 1 diabetes mellitus with ketoacidosis without coma: Secondary | ICD-10-CM | POA: Diagnosis present

## 2023-09-27 DIAGNOSIS — Z86718 Personal history of other venous thrombosis and embolism: Secondary | ICD-10-CM | POA: Diagnosis not present

## 2023-09-27 DIAGNOSIS — Z79899 Other long term (current) drug therapy: Secondary | ICD-10-CM | POA: Diagnosis not present

## 2023-09-27 DIAGNOSIS — G9341 Metabolic encephalopathy: Secondary | ICD-10-CM | POA: Diagnosis present

## 2023-09-27 DIAGNOSIS — E875 Hyperkalemia: Secondary | ICD-10-CM | POA: Diagnosis not present

## 2023-09-27 DIAGNOSIS — Z91148 Patient's other noncompliance with medication regimen for other reason: Secondary | ICD-10-CM

## 2023-09-27 DIAGNOSIS — F32A Depression, unspecified: Secondary | ICD-10-CM | POA: Diagnosis not present

## 2023-09-27 DIAGNOSIS — R4182 Altered mental status, unspecified: Secondary | ICD-10-CM | POA: Diagnosis not present

## 2023-09-27 DIAGNOSIS — T383X6A Underdosing of insulin and oral hypoglycemic [antidiabetic] drugs, initial encounter: Secondary | ICD-10-CM | POA: Diagnosis present

## 2023-09-27 DIAGNOSIS — Z91040 Latex allergy status: Secondary | ICD-10-CM

## 2023-09-27 DIAGNOSIS — F209 Schizophrenia, unspecified: Secondary | ICD-10-CM | POA: Diagnosis present

## 2023-09-27 DIAGNOSIS — Z91128 Patient's intentional underdosing of medication regimen for other reason: Secondary | ICD-10-CM

## 2023-09-27 DIAGNOSIS — E1110 Type 2 diabetes mellitus with ketoacidosis without coma: Principal | ICD-10-CM | POA: Diagnosis present

## 2023-09-27 DIAGNOSIS — E860 Dehydration: Secondary | ICD-10-CM | POA: Diagnosis present

## 2023-09-27 DIAGNOSIS — Z91199 Patient's noncompliance with other medical treatment and regimen due to unspecified reason: Secondary | ICD-10-CM

## 2023-09-27 DIAGNOSIS — Z833 Family history of diabetes mellitus: Secondary | ICD-10-CM

## 2023-09-27 DIAGNOSIS — I7410 Embolism and thrombosis of unspecified parts of aorta: Secondary | ICD-10-CM | POA: Diagnosis present

## 2023-09-27 DIAGNOSIS — N179 Acute kidney failure, unspecified: Secondary | ICD-10-CM | POA: Diagnosis not present

## 2023-09-27 DIAGNOSIS — Z7901 Long term (current) use of anticoagulants: Secondary | ICD-10-CM

## 2023-09-27 DIAGNOSIS — F1290 Cannabis use, unspecified, uncomplicated: Secondary | ICD-10-CM | POA: Diagnosis present

## 2023-09-27 DIAGNOSIS — I5022 Chronic systolic (congestive) heart failure: Secondary | ICD-10-CM | POA: Diagnosis present

## 2023-09-27 DIAGNOSIS — Z888 Allergy status to other drugs, medicaments and biological substances status: Secondary | ICD-10-CM

## 2023-09-27 DIAGNOSIS — Z794 Long term (current) use of insulin: Secondary | ICD-10-CM

## 2023-09-27 DIAGNOSIS — I445 Left posterior fascicular block: Secondary | ICD-10-CM | POA: Diagnosis not present

## 2023-09-27 DIAGNOSIS — Y92009 Unspecified place in unspecified non-institutional (private) residence as the place of occurrence of the external cause: Secondary | ICD-10-CM

## 2023-09-27 DIAGNOSIS — Z8616 Personal history of COVID-19: Secondary | ICD-10-CM

## 2023-09-27 DIAGNOSIS — T50904A Poisoning by unspecified drugs, medicaments and biological substances, undetermined, initial encounter: Secondary | ICD-10-CM

## 2023-09-27 DIAGNOSIS — F17210 Nicotine dependence, cigarettes, uncomplicated: Secondary | ICD-10-CM | POA: Diagnosis not present

## 2023-09-27 DIAGNOSIS — T43211A Poisoning by selective serotonin and norepinephrine reuptake inhibitors, accidental (unintentional), initial encounter: Secondary | ICD-10-CM | POA: Diagnosis present

## 2023-09-27 DIAGNOSIS — T45516A Underdosing of anticoagulants, initial encounter: Secondary | ICD-10-CM | POA: Diagnosis present

## 2023-09-27 LAB — I-STAT VENOUS BLOOD GAS, ED
Acid-base deficit: 26 mmol/L — ABNORMAL HIGH (ref 0.0–2.0)
Bicarbonate: 4.1 mmol/L — ABNORMAL LOW (ref 20.0–28.0)
Calcium, Ion: 1.03 mmol/L — ABNORMAL LOW (ref 1.15–1.40)
HCT: 46 % (ref 39.0–52.0)
Hemoglobin: 15.6 g/dL (ref 13.0–17.0)
O2 Saturation: 87 %
Patient temperature: 98
Potassium: 6.9 mmol/L (ref 3.5–5.1)
Sodium: 117 mmol/L — CL (ref 135–145)
TCO2: <5 mmol/L — ABNORMAL LOW (ref 22–32)
pCO2, Ven: 16.4 mmHg — CL (ref 44–60)
pH, Ven: 7.003 — CL (ref 7.25–7.43)
pO2, Ven: 76 mmHg — ABNORMAL HIGH (ref 32–45)

## 2023-09-27 LAB — BASIC METABOLIC PANEL
BUN: 30 mg/dL — ABNORMAL HIGH (ref 6–20)
CO2: <7 mmol/L — ABNORMAL LOW (ref 22–32)
Calcium: 8.6 mg/dL — ABNORMAL LOW (ref 8.9–10.3)
Chloride: 84 mmol/L — ABNORMAL LOW (ref 98–111)
Creatinine, Ser: 1.94 mg/dL — ABNORMAL HIGH (ref 0.61–1.24)
GFR, Estimated: 41 mL/min — ABNORMAL LOW (ref >60)
Glucose, Bld: >1200 mg/dL (ref 70–99)
Potassium: 7.3 mmol/L (ref 3.5–5.1)
Sodium: 120 mmol/L — ABNORMAL LOW (ref 135–145)

## 2023-09-27 LAB — CBC WITH DIFFERENTIAL/PLATELET
Abs Immature Granulocytes: 0.77 10*3/uL — ABNORMAL HIGH (ref 0.00–0.07)
Basophils Absolute: 0.2 10*3/uL — ABNORMAL HIGH (ref 0.0–0.1)
Basophils Relative: 1 %
Eosinophils Absolute: 0 10*3/uL (ref 0.0–0.5)
Eosinophils Relative: 0 %
HCT: 42.2 % (ref 39.0–52.0)
Hemoglobin: 12.8 g/dL — ABNORMAL LOW (ref 13.0–17.0)
Immature Granulocytes: 5 %
Lymphocytes Relative: 11 %
Lymphs Abs: 1.8 10*3/uL (ref 0.7–4.0)
MCH: 32.9 pg (ref 26.0–34.0)
MCHC: 30.3 g/dL (ref 30.0–36.0)
MCV: 108.5 fL — ABNORMAL HIGH (ref 80.0–100.0)
Monocytes Absolute: 1.1 10*3/uL — ABNORMAL HIGH (ref 0.1–1.0)
Monocytes Relative: 7 %
Neutro Abs: 12.2 10*3/uL — ABNORMAL HIGH (ref 1.7–7.7)
Neutrophils Relative %: 76 %
Platelets: 399 10*3/uL (ref 150–400)
RBC: 3.89 MIL/uL — ABNORMAL LOW (ref 4.22–5.81)
RDW: 13.2 % (ref 11.5–15.5)
WBC: 16 10*3/uL — ABNORMAL HIGH (ref 4.0–10.5)
nRBC: 0 % (ref 0.0–0.2)

## 2023-09-27 LAB — URINALYSIS, ROUTINE W REFLEX MICROSCOPIC
Bilirubin Urine: NEGATIVE
Glucose, UA: >=500 mg/dL — AB
Ketones, ur: 80 mg/dL — AB
Leukocytes,Ua: NEGATIVE
Nitrite: NEGATIVE
Protein, ur: NEGATIVE mg/dL
Specific Gravity, Urine: 1.015 (ref 1.005–1.030)
pH: 5 (ref 5.0–8.0)

## 2023-09-27 LAB — COMPREHENSIVE METABOLIC PANEL
ALT: 106 U/L — ABNORMAL HIGH (ref 0–44)
AST: 51 U/L — ABNORMAL HIGH (ref 15–41)
Albumin: 4.7 g/dL (ref 3.5–5.0)
Alkaline Phosphatase: 213 U/L — ABNORMAL HIGH (ref 38–126)
BUN: 29 mg/dL — ABNORMAL HIGH (ref 6–20)
CO2: <7 mmol/L — ABNORMAL LOW (ref 22–32)
Calcium: 8.3 mg/dL — ABNORMAL LOW (ref 8.9–10.3)
Chloride: 82 mmol/L — ABNORMAL LOW (ref 98–111)
Creatinine, Ser: 1.88 mg/dL — ABNORMAL HIGH (ref 0.61–1.24)
GFR, Estimated: 43 mL/min — ABNORMAL LOW (ref >60)
Glucose, Bld: >1200 mg/dL (ref 70–99)
Potassium: 6.6 mmol/L (ref 3.5–5.1)
Sodium: 119 mmol/L — CL (ref 135–145)
Total Bilirubin: 1.4 mg/dL — ABNORMAL HIGH (ref 0.3–1.2)
Total Protein: 7.3 g/dL (ref 6.5–8.1)

## 2023-09-27 LAB — URINALYSIS, MICROSCOPIC (REFLEX)

## 2023-09-27 LAB — LACTIC ACID, PLASMA
Lactic Acid, Venous: 7.6 mmol/L (ref 0.5–1.9)
Lactic Acid, Venous: 7.1 mmol/L (ref 0.5–1.9)

## 2023-09-27 LAB — RAPID URINE DRUG SCREEN, HOSP PERFORMED
Amphetamines: NOT DETECTED
Barbiturates: NOT DETECTED
Benzodiazepines: NOT DETECTED
Cocaine: NOT DETECTED
Opiates: NOT DETECTED
Tetrahydrocannabinol: POSITIVE — AB

## 2023-09-27 LAB — CBG MONITORING, ED
Glucose-Capillary: >600 mg/dL (ref 70–99)
Glucose-Capillary: >600 mg/dL (ref 70–99)
Glucose-Capillary: >600 mg/dL (ref 70–99)

## 2023-09-27 LAB — SALICYLATE LEVEL: Salicylate Lvl: <7 mg/dL — ABNORMAL LOW (ref 7.0–30.0)

## 2023-09-27 LAB — ETHANOL: Alcohol, Ethyl (B): <10 mg/dL (ref <10)

## 2023-09-27 LAB — ACETAMINOPHEN LEVEL: Acetaminophen (Tylenol), Serum: <10 ug/mL — ABNORMAL LOW (ref 10–30)

## 2023-09-27 LAB — BETA-HYDROXYBUTYRIC ACID: Beta-Hydroxybutyric Acid: >8 mmol/L — ABNORMAL HIGH (ref 0.05–0.27)

## 2023-09-27 MED ORDER — CALCIUM GLUCONATE 10 % IV SOLN
1.0000 g | Freq: Once | INTRAVENOUS | Status: AC
  Administered 2023-09-27: 1 g via INTRAVENOUS
  Filled 2023-09-27: qty 10

## 2023-09-27 MED ORDER — INSULIN REGULAR(HUMAN) IN NACL 100-0.9 UT/100ML-% IV SOLN
INTRAVENOUS | Status: DC
  Administered 2023-09-27: 7.5 [IU]/h via INTRAVENOUS
  Filled 2023-09-27: qty 100

## 2023-09-27 MED ORDER — DEXTROSE 50 % IV SOLN
0.0000 mL | INTRAVENOUS | Status: DC | PRN
Start: 2023-09-27 — End: 2023-09-28

## 2023-09-27 MED ORDER — LACTATED RINGERS IV BOLUS
1000.0000 mL | Freq: Once | INTRAVENOUS | Status: AC
  Administered 2023-09-27: 1000 mL via INTRAVENOUS

## 2023-09-27 MED ORDER — VANCOMYCIN HCL IN DEXTROSE 1-5 GM/200ML-% IV SOLN
1000.0000 mg | Freq: Once | INTRAVENOUS | Status: AC
  Administered 2023-09-27: 1000 mg via INTRAVENOUS
  Filled 2023-09-27: qty 200

## 2023-09-27 MED ORDER — VANCOMYCIN VARIABLE DOSE PER UNSTABLE RENAL FUNCTION (PHARMACIST DOSING)
Status: DC
  Filled 2023-09-27: qty 1

## 2023-09-27 MED ORDER — VANCOMYCIN HCL 500 MG IV SOLR
500.0000 mg | Freq: Once | INTRAVENOUS | Status: AC
  Administered 2023-09-28: 500 mg via INTRAVENOUS

## 2023-09-27 MED ORDER — SODIUM CHLORIDE 0.9 % IV SOLN: 2.0000 g | Freq: Two times a day (BID) | INTRAVENOUS | Status: DC

## 2023-09-27 MED ORDER — SODIUM CHLORIDE 0.9% FLUSH
10.0000 mL | Freq: Two times a day (BID) | INTRAVENOUS | Status: DC
  Filled 2023-09-27: qty 10

## 2023-09-27 MED ORDER — SODIUM CHLORIDE 0.9 % IV SOLN
2.0000 g | Freq: Once | INTRAVENOUS | Status: AC
  Administered 2023-09-27: 2 g via INTRAVENOUS
  Filled 2023-09-27: qty 12.5

## 2023-09-27 NOTE — Progress Notes (Signed)
Pharmacy Antibiotic Note  Tom Anderson is a 52 y.o. male admitted on 09/27/2023 with sepsis.  Pharmacy has been consulted for cefepime and vancomycin dosing.  Patient reported ingesting 9 doses of Ecstasy tonight and has since felt poor. CBG >1200 pH 7.0, Co2 undetectable LA 7.6, RR 17, HR 116, BP 102/74 Scr 1.88 (BL <1)  Plan: Vancomycin as needed per unstable renal function  >Obtain levels as indicated per pharmacy protocol Cefepime 2g IV every 12 hours Monitor renal function, CBC, cultures, and signs of clinical improvement Deescalate as able  Height: 5\' 10"  (177.8 cm) Weight: 72.6 kg (160 lb) IBW/kg (Calculated) : 73  Temp (24hrs), Avg:98 F (36.7 C), Min:98 F (36.7 C), Max:98 F (36.7 C)  Recent Labs  Lab 09/27/23 2123  WBC 16.0*    CrCl cannot be calculated (Patient's most recent lab result is older than the maximum 21 days allowed.).    Allergies  Allergen Reactions   Nitrous Oxide Nausea And Vomiting   Latex Rash    Antimicrobials this admission: 10/28 cefepime >>  10/28 vancomycin  >>    Microbiology results: 10/28 BCx: sent  Thank you for allowing pharmacy to be a part of this patient's care.  Stephenie Acres, PharmD PGY1 Pharmacy Resident 09/27/2023 11:14 PM

## 2023-09-27 NOTE — ED Provider Notes (Signed)
[facility] Provider Note   CSN: [id number] Arrival date & time: 09/27/23  2017     History  Chief Complaint  Patient presents with   Drug Overdose    Tom Anderson is a 52 y.o. male with PMH as listed below who presents with drug overdose.   Pt reports he took 5 doses of his 50 mg trazodone tabs last night, unknown ingestion time. He states he "did something stupid" and states "it was not an act of war, it was an act of boredom." He denies trying to harm himself or SI/HI. He endorses taking 3 extra strength tylenol as well and smoking marijuana. He denies other illicit drug use including crack cocaine, methamphetamine, or ecstasy (says he misspoke in triage). He reports he feels "horrible" and "sick to his stomach." Denies falling or hitting his head, f/c, CP, abd pain, urinary sxs. He also reports his blood sugar is high. Glucose monitor is reading "high" in triage. 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   Past Medical History:  Diagnosis Date   Asthma    Depression    Diabetes mellitus without complication (HCC)    Schizophrenia (HCC)    Uses self-applied continuous glucose monitoring device        Home Medications Prior to Admission medications   Medication Sig Start Date End Date Taking? Authorizing Provider  acetaminophen (TYLENOL) 325 MG tablet Take 2 tablets (650 mg total) by mouth every 6 (six) hours as needed for mild pain (or Fever >/= 101). Patient not taking: Reported on 09/04/2023 05/22/23   Meredeth Ide, MD  ARIPiprazole (ABILIFY) 10 MG tablet Take 1 tablet (10 mg total) by mouth daily. Patient not taking: Reported on 09/04/2023 03/23/23   Rai, Delene Ruffini, MD  Blood Glucose Monitoring Suppl DEVI 1 each by Does not apply route 3 (three) times daily. May dispense any manufacturer covered by patient's insurance. 08/27/23   Rai, Delene Ruffini, MD  Continuous Glucose Sensor (DEXCOM G6 SENSOR) MISC Inject 1 Device into  the skin See admin instructions. Place 1 new sensor into the skin every 10 days Patient not taking: Reported on 09/04/2023 05/15/23   [provider]  folic acid (FOLVITE) 1 MG tablet Take 1 tablet (1 mg total) by mouth daily. Patient not taking: Reported on 09/04/2023 03/23/23   Rai, Delene Ruffini, MD  Glucose Blood (BLOOD GLUCOSE TEST STRIPS) STRP 1 each by Does not apply route 3 (three) times daily. Use as directed to check blood sugar. May dispense any manufacturer covered by patient's insurance and fits patient's device. 08/27/23   Rai, Ripudeep K, MD  insulin aspart (NOVOLOG) 100 UNIT/ML FlexPen Inject 3 Units into the skin 3 (three) times daily with meals. Do not take if you do not eat a meal. 09/05/23   Danford, Earl Lites, MD  Insulin Pen Needle (PEN NEEDLES) 31G X 5 MM MISC 1 each by Does not apply route 3 (three) times daily. May dispense any manufacturer covered by patient's insurance. 09/05/23   Danford, Earl Lites, MD  Lancet Device MISC 1 each by Does not apply route 3 (three) times daily. May dispense any manufacturer covered by patient's insurance. 08/27/23   Rai, Delene Ruffini, MD  Lancets MISC 1 each by Does not apply route 3 (three) times daily. Use as directed to check blood sugar. May dispense any manufacturer covered by patient's insurance and fits patient's device. 08/27/23   Cathren Harsh, MD  LEVEMIR 100 UNIT/ML injection Inject 12 Units into the skin 2 (two) times daily.    [provider]  nicotine (NICODERM CQ - DOSED IN MG/24 HOURS) 21 mg/24hr patch Place 1 patch (21 mg total) onto the skin daily. Patient not taking: Reported on 05/21/2023 05/03/23   Burnadette Pop, MD  ondansetron (ZOFRAN) 4 MG tablet Take 1 tablet (4 mg total) by mouth every 6 (six) hours as needed for nausea. Patient not taking: Reported on 09/04/2023 08/27/23   Rai, Delene Ruffini, MD  pantoprazole (PROTONIX) 40 MG tablet Take 1 tablet (40 mg total) by mouth daily. Patient not taking: Reported on  09/04/2023 08/27/23   Rai, Delene Ruffini, MD  rivaroxaban (XARELTO) 20 MG TABS tablet Take 1 tablet (20 mg total) by mouth daily with supper. Take this when the xarelto starter pack is completed. Patient not taking: Reported on 05/21/2023 04/12/23   Rai, Delene Ruffini, MD  traZODone (DESYREL) 50 MG tablet Take 1 tablet (50 mg total) by mouth at bedtime. Patient not taking: Reported on 05/21/2023 03/24/23   Russella Dar, NP  VENTOLIN HFA 108 (90 Base) MCG/ACT inhaler Inhale 2 puffs into the lungs every 4 (four) hours as needed for wheezing or shortness of breath. 05/22/23   Meredeth Ide, MD      Allergies    Nitrous oxide and Latex    Review of Systems   Review of Systems A 10 point review of systems was performed and is negative unless otherwise reported in HPI.  Physical Exam Updated Vital Signs BP 102/74   Pulse (!) 116   Temp 98 F (36.7 C)   Resp 17   Ht 5\' 10"  (1.778 m)   Wt 72.6 kg   SpO2 100%   BMI 22.96 kg/m  Physical Exam General: Acutely ill-appearing male, lying in bed.  HEENT: PERRLA 2mm bilaterally, Sclera anicteric, dry mucous membranes, edentulous, trachea midline.  Cardiology: Regular tachycardic rate, no murmurs/rubs/gallops. BL radial and DP pulses equal bilaterally.  Resp: Tachypneic. CTAB, no wheezes, rhonchi, crackles.  Abd: Soft, non-tender, non-distended. No rebound tenderness or guarding.  GU: Deferred. MSK: No peripheral edema or signs of trauma. Extremities without deformity or TTP.  Skin: warm, dry.  Neuro: Somnolent but easily responsive to voice, A&Ox4, CNs II-XII grossly intact. MAEs. Sensation grossly intact.   ED Results / Procedures / Treatments   Labs (all labs ordered are listed, but only abnormal results are displayed) Labs Reviewed  CBC WITH DIFFERENTIAL/PLATELET - Abnormal; Notable for the following components:      Result Value   WBC 16.0 (*)    RBC 3.89 (*)    Hemoglobin 12.8 (*)    MCV 108.5 (*)    Neutro Abs 12.2 (*)    Monocytes  Absolute 1.1 (*)    Basophils Absolute 0.2 (*)    Abs Immature Granulocytes 0.77 (*)    All other components within normal limits  COMPREHENSIVE METABOLIC PANEL - Abnormal; Notable for the following components:   Sodium 119 (*)    Potassium 6.6 (*)    Chloride 82 (*)    CO2 <7 (*)    Glucose, Bld >1,200 (*)    BUN 29 (*)    Creatinine, Ser 1.88 (*)    Calcium 8.3 (*)    AST 51 (*)    ALT 106 (*)    Alkaline Phosphatase 213 (*)    Total Bilirubin 1.4 (*)    GFR, Estimated 43 (*)    All other components  within normal limits  ACETAMINOPHEN LEVEL - Abnormal; Notable for the following components:   Acetaminophen (Tylenol), Serum <10 (*)    All other components within normal limits  SALICYLATE LEVEL - Abnormal; Notable for the following components:   Salicylate Lvl <7.0 (*)    All other components within normal limits  LACTIC ACID, PLASMA - Abnormal; Notable for the following components:   Lactic Acid, Venous 7.6 (*)    All other components within normal limits  CBG MONITORING, ED - Abnormal; Notable for the following components:   Glucose-Capillary >600 (*)    All other components within normal limits  I-STAT VENOUS BLOOD GAS, ED - Abnormal; Notable for the following components:   pH, Ven 7.003 (*)    pCO2, Ven 16.4 (*)    pO2, Ven 76 (*)    Bicarbonate 4.1 (*)    TCO2 <5 (*)    Acid-base deficit 26.0 (*)    Sodium 117 (*)    Potassium 6.9 (*)    Calcium, Ion 1.03 (*)    All other components within normal limits  CULTURE, BLOOD (ROUTINE X 2)  CULTURE, BLOOD (ROUTINE X 2)  ETHANOL  URINALYSIS, ROUTINE W REFLEX MICROSCOPIC  BETA-HYDROXYBUTYRIC ACID  LACTIC ACID, PLASMA  RAPID URINE DRUG SCREEN, HOSP PERFORMED  BASIC METABOLIC PANEL    EKG None  Radiology No results found.  Procedures .Critical Care  Performed by: Loetta Rough, MD Authorized by: Loetta Rough, MD   Critical care provider statement:    Critical care time (minutes):  60   Critical care was  necessary to treat or prevent imminent or life-threatening deterioration of the following conditions:  Toxidrome and metabolic crisis   Critical care was time spent personally by me on the following activities:  Development of treatment plan with patient or surrogate, discussions with consultants, evaluation of patient's response to treatment, examination of patient, ordering and review of laboratory studies, ordering and review of radiographic studies, ordering and performing treatments and interventions, pulse oximetry, re-evaluation of patient's condition and review of old charts     Medications Ordered in ED Medications  insulin regular, human (MYXREDLIN) 100 units/ 100 mL infusion (has no administration in time range)  dextrose 50 % solution 0-50 mL (has no administration in time range)  sodium chloride flush (NS) 0.9 % injection 10 mL (has no administration in time range)  sodium chloride flush (NS) 0.9 % injection 10 mL (has no administration in time range)  ceFEPIme (MAXIPIME) 2 g in sodium chloride 0.9 % 100 mL IVPB (2 g Intravenous New Bag/Given 09/27/23 2223)  vancomycin (VANCOCIN) IVPB 1000 mg/200 mL premix (has no administration in time range)    Followed by  vancomycin (VANCOCIN) 500 mg in sodium chloride 0.9 % 100 mL IVPB (has no administration in time range)  lactated ringers bolus 1,000 mL (0 mLs Intravenous Stopped 09/27/23 2238)  calcium gluconate inj 10% (1 g) URGENT USE ONLY! (1 g Intravenous Given 09/27/23 2148)    ED Course/ Medical Decision Making/ A&P                          Medical Decision Making Amount and/or Complexity of Data Reviewed Labs: ordered. Decision-making details documented in ED Course. Radiology: ordered.  Risk Prescription drug management. Decision regarding hospitalization.    This patient presents to the ED for concern of ingestion, this involves an extensive number of treatment options, and is a complaint that carries with it  a high risk  of complications and morbidity.  I considered the following differential and admission for this acute, potentially life threatening condition.   MDM:    Ddx of acute altered mental status or encephalopathy considered but not limited to: -Intracranial abnormalities such as ICH, hydrocephalus, head trauma - NCAT on exam, patient denies head trauma, in s/o reported ingestion and no FNDs believe we can hold off on head imagine for now -Toxic ingestion - reported trazodone ingestion yesterday, unclear how much or what time. Had reported other ingestion in triage. Will get tox screens and further evaluate/monitor. EKG shows no QT prolongation or widened QRS complex. Also consider other drug ingestion  such as opioid overdose, anticholinergic toxicity, EtOH, or stimulant use. -Electrolyte abnormalities or hyper/hypoglycemia - glucose >600 on POC. EKG does show possible peaked T waves and indeed K on shock panel is 6.9. -Hypercarbia or hypoxia -ACS or arrhythmia - no chest pain or signs of ischemia on EKG.  -Infection such as UTI, PNA - afebrile here, will evaluate  Clinical Course as of 09/27/23 2241  Mon Sep 27, 2023  2134 Glucose-Capillary(!!): >600 [HN]  2134 pH 6.99, pCO2 16. Metabolic acidosis. Highest likelihood DKA given glucose. Giving fluids and will get remainder of labs. [HN]  2138 Potassium(!!): 6.9 Elevated potassium on istat. Possible peaked T waves on EKG. Will give calcium gluconate. Will await potassium on BMP and start insulin. [HN]  2143 WBC(!): 16.0 +leukocytosis. With tachycardia and tachypnea, does meet sepsis criteria. Will draw blood culture and give broad spectrum abx as well.  [HN]  2200 Neg tylenol/salicylate/EtOH [HN]  2213 Lactic Acid, Venous(!!): 7.6 Receiving fluids [HN]  2213 Potassium(!!): 6.6 Will be getting insulin [HN]  2213 Glucose(!!): >1,200 [HN]  2213 Creatinine(!): 1.88 [HN]  2213 Consulted to intensivist for admisison [HN]  2222 Patient with somnolence  and AMS, whether from possible ingestion of trazodone or DKA, with patient's metabolic derangements do believe he warrants ICU admission. D/w Dr. Delton Coombes who has accepted patient to the ICU.  [HN]  2233 D/w poison control about trazodone. To watch for sedation, QTc prolongation, mild bradycardia or hypotension. Can see seizures sometimes. Ideally 6 hours obs from time of ingestion. Supportive care. Repeat EKG in 4 hours. [HN]    Clinical Course User Index [HN] Loetta Rough, MD    Labs: I Ordered, and personally interpreted labs.  The pertinent results include:  those listed above  Imaging Studies ordered: I ordered imaging studies including CXR I independently visualized and interpreted imaging. I agree with the radiologist interpretation  Additional history obtained from chart review.    Cardiac Monitoring: The patient was maintained on a cardiac monitor.  I personally viewed and interpreted the cardiac monitored which showed an underlying rhythm of: sinus tachycardia  Reevaluation: After the interventions noted above, I reevaluated the patient and found that they have :improved  Social Determinants of Health: Lives independently  Disposition:    Co morbidities that complicate the patient evaluation  Past Medical History:  Diagnosis Date   Asthma    Depression    Diabetes mellitus without complication (HCC)    Schizophrenia (HCC)    Uses self-applied continuous glucose monitoring device      Medicines Meds ordered this encounter  Medications   lactated ringers bolus 1,000 mL   calcium gluconate inj 10% (1 g) URGENT USE ONLY!   insulin regular, human (MYXREDLIN) 100 units/ 100 mL infusion    Order Specific Question:   EndoTool low target:  Answer:   140    Order Specific Question:   EndoTool high target:    Answer:   180    Order Specific Question:   Type of Diabetes    Answer:   Type 2    Order Specific Question:   Mode of Therapy    Answer:   ENDOX1 for DKA     Order Specific Question:   Start Method    Answer:   EndoTool to calculate   dextrose 50 % solution 0-50 mL   sodium chloride flush (NS) 0.9 % injection 10 mL   sodium chloride flush (NS) 0.9 % injection 10 mL   ceFEPIme (MAXIPIME) 2 g in sodium chloride 0.9 % 100 mL IVPB    Order Specific Question:   Antibiotic Indication:    Answer:   Sepsis   FOLLOWED BY Linked Order Group    vancomycin (VANCOCIN) IVPB 1000 mg/200 mL premix     Order Specific Question:   Indication:     Answer:   Sepsis    vancomycin (VANCOCIN) 500 mg in sodium chloride 0.9 % 100 mL IVPB    I have reviewed the patients home medicines and have made adjustments as needed  Problem List / ED Course: Problem List Items Addressed This Visit       Endocrine   DKA (diabetic ketoacidosis) (HCC) - Primary   Relevant Medications   insulin regular, human (MYXREDLIN) 100 units/ 100 mL infusion   Other Visit Diagnoses     Drug overdose of undetermined intent, initial encounter                       This note was created using dictation software, which may contain spelling or grammatical errors.    Loetta Rough, MD 09/27/23 [phone]

## 2023-09-27 NOTE — ED Triage Notes (Signed)
Pt reports he ate 9 doses of Ectasy tonight, unknown ingestion time. He reports he feels "horrible" and "sick to his stomach." He also reports his blood sugar is high. Glucose monitor is reading "high" in triage. He';s breathing is labored and he is restless. Pinpoint pupils.

## 2023-09-27 NOTE — ED Notes (Signed)
Called [facility] to see about patient being transferred out to [facility] will call back with an update   Called @ 23:45  Patient currently does not have orders in to be transferred out.

## 2023-09-28 DIAGNOSIS — Z794 Long term (current) use of insulin: Secondary | ICD-10-CM | POA: Diagnosis not present

## 2023-09-28 DIAGNOSIS — G9341 Metabolic encephalopathy: Secondary | ICD-10-CM | POA: Diagnosis present

## 2023-09-28 DIAGNOSIS — T43211A Poisoning by selective serotonin and norepinephrine reuptake inhibitors, accidental (unintentional), initial encounter: Secondary | ICD-10-CM | POA: Diagnosis present

## 2023-09-28 DIAGNOSIS — Z7901 Long term (current) use of anticoagulants: Secondary | ICD-10-CM | POA: Diagnosis not present

## 2023-09-28 DIAGNOSIS — N179 Acute kidney failure, unspecified: Secondary | ICD-10-CM | POA: Diagnosis present

## 2023-09-28 DIAGNOSIS — E875 Hyperkalemia: Secondary | ICD-10-CM | POA: Diagnosis present

## 2023-09-28 DIAGNOSIS — E860 Dehydration: Secondary | ICD-10-CM | POA: Diagnosis present

## 2023-09-28 DIAGNOSIS — Y92009 Unspecified place in unspecified non-institutional (private) residence as the place of occurrence of the external cause: Secondary | ICD-10-CM | POA: Diagnosis not present

## 2023-09-28 DIAGNOSIS — I7410 Embolism and thrombosis of unspecified parts of aorta: Secondary | ICD-10-CM | POA: Diagnosis present

## 2023-09-28 DIAGNOSIS — T50904A Poisoning by unspecified drugs, medicaments and biological substances, undetermined, initial encounter: Secondary | ICD-10-CM | POA: Diagnosis not present

## 2023-09-28 DIAGNOSIS — Z91148 Patient's other noncompliance with medication regimen for other reason: Secondary | ICD-10-CM | POA: Diagnosis not present

## 2023-09-28 DIAGNOSIS — Z91040 Latex allergy status: Secondary | ICD-10-CM | POA: Diagnosis not present

## 2023-09-28 DIAGNOSIS — R471 Dysarthria and anarthria: Secondary | ICD-10-CM | POA: Diagnosis present

## 2023-09-28 DIAGNOSIS — Z91199 Patient's noncompliance with other medical treatment and regimen due to unspecified reason: Secondary | ICD-10-CM | POA: Diagnosis not present

## 2023-09-28 DIAGNOSIS — Z833 Family history of diabetes mellitus: Secondary | ICD-10-CM | POA: Diagnosis not present

## 2023-09-28 DIAGNOSIS — F1290 Cannabis use, unspecified, uncomplicated: Secondary | ICD-10-CM | POA: Diagnosis present

## 2023-09-28 DIAGNOSIS — Z79899 Other long term (current) drug therapy: Secondary | ICD-10-CM | POA: Diagnosis not present

## 2023-09-28 DIAGNOSIS — E1110 Type 2 diabetes mellitus with ketoacidosis without coma: Secondary | ICD-10-CM | POA: Diagnosis not present

## 2023-09-28 DIAGNOSIS — E1010 Type 1 diabetes mellitus with ketoacidosis without coma: Secondary | ICD-10-CM | POA: Diagnosis present

## 2023-09-28 DIAGNOSIS — F17210 Nicotine dependence, cigarettes, uncomplicated: Secondary | ICD-10-CM | POA: Diagnosis present

## 2023-09-28 DIAGNOSIS — Z91128 Patient's intentional underdosing of medication regimen for other reason: Secondary | ICD-10-CM | POA: Diagnosis not present

## 2023-09-28 DIAGNOSIS — T383X6A Underdosing of insulin and oral hypoglycemic [antidiabetic] drugs, initial encounter: Secondary | ICD-10-CM | POA: Diagnosis present

## 2023-09-28 DIAGNOSIS — I5022 Chronic systolic (congestive) heart failure: Secondary | ICD-10-CM | POA: Diagnosis present

## 2023-09-28 DIAGNOSIS — T45516A Underdosing of anticoagulants, initial encounter: Secondary | ICD-10-CM | POA: Diagnosis present

## 2023-09-28 DIAGNOSIS — F209 Schizophrenia, unspecified: Secondary | ICD-10-CM | POA: Diagnosis present

## 2023-09-28 DIAGNOSIS — Z888 Allergy status to other drugs, medicaments and biological substances status: Secondary | ICD-10-CM | POA: Diagnosis not present

## 2023-09-28 DIAGNOSIS — Z8616 Personal history of COVID-19: Secondary | ICD-10-CM | POA: Diagnosis not present

## 2023-09-28 LAB — CBC
HCT: 30.9 % — ABNORMAL LOW (ref 39.0–52.0)
Hemoglobin: 10.8 g/dL — ABNORMAL LOW (ref 13.0–17.0)
MCH: 32.5 pg (ref 26.0–34.0)
MCHC: 35 g/dL (ref 30.0–36.0)
MCV: 93.1 fL (ref 80.0–100.0)
Platelets: 287 10*3/uL (ref 150–400)
RBC: 3.32 MIL/uL — ABNORMAL LOW (ref 4.22–5.81)
RDW: 11.9 % (ref 11.5–15.5)
WBC: 17.8 10*3/uL — ABNORMAL HIGH (ref 4.0–10.5)
nRBC: 0 % (ref 0.0–0.2)

## 2023-09-28 LAB — BASIC METABOLIC PANEL
Anion gap: 27 — ABNORMAL HIGH (ref 5–15)
Anion gap: 14 (ref 5–15)
Anion gap: 9 (ref 5–15)
BUN: 30 mg/dL — ABNORMAL HIGH (ref 6–20)
BUN: 22 mg/dL — ABNORMAL HIGH (ref 6–20)
BUN: 19 mg/dL (ref 6–20)
CO2: 9 mmol/L — ABNORMAL LOW (ref 22–32)
CO2: 22 mmol/L (ref 22–32)
CO2: 24 mmol/L (ref 22–32)
Calcium: 9.3 mg/dL (ref 8.9–10.3)
Calcium: 9.3 mg/dL (ref 8.9–10.3)
Calcium: 8.7 mg/dL — ABNORMAL LOW (ref 8.9–10.3)
Chloride: 97 mmol/L — ABNORMAL LOW (ref 98–111)
Chloride: 98 mmol/L (ref 98–111)
Chloride: 100 mmol/L (ref 98–111)
Creatinine, Ser: 2.1 mg/dL — ABNORMAL HIGH (ref 0.61–1.24)
Creatinine, Ser: 1.23 mg/dL (ref 0.61–1.24)
Creatinine, Ser: 0.94 mg/dL (ref 0.61–1.24)
GFR, Estimated: 38 mL/min — ABNORMAL LOW (ref >60)
GFR, Estimated: >60 mL/min (ref >60)
GFR, Estimated: >60 mL/min (ref >60)
Glucose, Bld: 674 mg/dL (ref 70–99)
Glucose, Bld: 225 mg/dL — ABNORMAL HIGH (ref 70–99)
Glucose, Bld: 128 mg/dL — ABNORMAL HIGH (ref 70–99)
Potassium: 4.5 mmol/L (ref 3.5–5.1)
Potassium: 4.1 mmol/L (ref 3.5–5.1)
Potassium: 3.5 mmol/L (ref 3.5–5.1)
Sodium: 133 mmol/L — ABNORMAL LOW (ref 135–145)
Sodium: 134 mmol/L — ABNORMAL LOW (ref 135–145)
Sodium: 133 mmol/L — ABNORMAL LOW (ref 135–145)

## 2023-09-28 LAB — PHOSPHORUS: Phosphorus: 2.7 mg/dL (ref 2.5–4.6)

## 2023-09-28 LAB — GLUCOSE, CAPILLARY
Glucose-Capillary: >600 mg/dL (ref 70–99)
Glucose-Capillary: >600 mg/dL (ref 70–99)
Glucose-Capillary: 517 mg/dL (ref 70–99)
Glucose-Capillary: >600 mg/dL (ref 70–99)
Glucose-Capillary: 356 mg/dL — ABNORMAL HIGH (ref 70–99)
Glucose-Capillary: 420 mg/dL — ABNORMAL HIGH (ref 70–99)
Glucose-Capillary: 246 mg/dL — ABNORMAL HIGH (ref 70–99)
Glucose-Capillary: 242 mg/dL — ABNORMAL HIGH (ref 70–99)
Glucose-Capillary: 231 mg/dL — ABNORMAL HIGH (ref 70–99)
Glucose-Capillary: 223 mg/dL — ABNORMAL HIGH (ref 70–99)
Glucose-Capillary: 212 mg/dL — ABNORMAL HIGH (ref 70–99)
Glucose-Capillary: 198 mg/dL — ABNORMAL HIGH (ref 70–99)
Glucose-Capillary: 190 mg/dL — ABNORMAL HIGH (ref 70–99)
Glucose-Capillary: 151 mg/dL — ABNORMAL HIGH (ref 70–99)
Glucose-Capillary: 134 mg/dL — ABNORMAL HIGH (ref 70–99)
Glucose-Capillary: 216 mg/dL — ABNORMAL HIGH (ref 70–99)

## 2023-09-28 LAB — BETA-HYDROXYBUTYRIC ACID
Beta-Hydroxybutyric Acid: >8 mmol/L — ABNORMAL HIGH (ref 0.05–0.27)
Beta-Hydroxybutyric Acid: 1.71 mmol/L — ABNORMAL HIGH (ref 0.05–0.27)

## 2023-09-28 LAB — MAGNESIUM: Magnesium: 1.8 mg/dL (ref 1.7–2.4)

## 2023-09-28 LAB — CBG MONITORING, ED
Glucose-Capillary: >600 mg/dL (ref 70–99)
Glucose-Capillary: >600 mg/dL (ref 70–99)

## 2023-09-28 LAB — MRSA NEXT GEN BY PCR, NASAL: MRSA by PCR Next Gen: NOT DETECTED

## 2023-09-28 LAB — LACTIC ACID, PLASMA
Lactic Acid, Venous: 6.9 mmol/L (ref 0.5–1.9)
Lactic Acid, Venous: 2.7 mmol/L (ref 0.5–1.9)

## 2023-09-28 MED ORDER — ORAL CARE MOUTH RINSE: 15.0000 mL | OROMUCOSAL | Status: DC | PRN

## 2023-09-28 MED ORDER — INSULIN ASPART 100 UNIT/ML FLEXPEN: 3.0000 [IU] | PEN_INJECTOR | Freq: Three times a day (TID) | SUBCUTANEOUS | 3 refills | Status: AC

## 2023-09-28 MED ORDER — DEXTROSE IN LACTATED RINGERS 5 % IV SOLN: INTRAVENOUS | Status: DC

## 2023-09-28 MED ORDER — INSULIN ASPART 100 UNIT/ML IJ SOLN: 0.0000 [IU] | Freq: Three times a day (TID) | INTRAMUSCULAR | Status: DC

## 2023-09-28 MED ORDER — RIVAROXABAN 20 MG PO TABS: 20.0000 mg | ORAL_TABLET | Freq: Every day | ORAL | 1 refills | Status: DC

## 2023-09-28 MED ORDER — INSULIN DETEMIR 100 UNIT/ML ~~LOC~~ SOLN
16.0000 [IU] | Freq: Two times a day (BID) | SUBCUTANEOUS | Status: DC
  Administered 2023-09-28: 16 [IU] via SUBCUTANEOUS
  Filled 2023-09-28 (×2): qty 0.16

## 2023-09-28 MED ORDER — LACTATED RINGERS IV BOLUS
1000.0000 mL | INTRAVENOUS | Status: AC
Start: 2023-09-28 — End: 2023-09-28
  Administered 2023-09-28 (×2): 1000 mL via INTRAVENOUS

## 2023-09-28 MED ORDER — DEXTROSE 50 % IV SOLN: 0.0000 mL | INTRAVENOUS | Status: DC | PRN

## 2023-09-28 MED ORDER — HEPARIN SODIUM (PORCINE) 5000 UNIT/ML IJ SOLN
5000.0000 [IU] | Freq: Three times a day (TID) | INTRAMUSCULAR | Status: DC
  Administered 2023-09-28: 5000 [IU] via SUBCUTANEOUS
  Filled 2023-09-28: qty 1

## 2023-09-28 MED ORDER — LACTATED RINGERS IV SOLN: INTRAVENOUS | Status: DC

## 2023-09-28 MED ORDER — INSULIN DETEMIR 100 UNIT/ML ~~LOC~~ SOLN: 16.0000 [IU] | Freq: Two times a day (BID) | SUBCUTANEOUS | 11 refills | Status: AC

## 2023-09-28 MED ORDER — RIVAROXABAN 20 MG PO TABS
20.0000 mg | ORAL_TABLET | Freq: Every day | ORAL | Status: DC
  Filled 2023-09-28: qty 1

## 2023-09-28 MED ORDER — IPRATROPIUM-ALBUTEROL 0.5-2.5 (3) MG/3ML IN SOLN
3.0000 mL | RESPIRATORY_TRACT | Status: DC | PRN
  Administered 2023-09-28: 3 mL via RESPIRATORY_TRACT
  Filled 2023-09-28: qty 3

## 2023-09-28 MED ORDER — INSULIN ASPART 100 UNIT/ML IJ SOLN: 0.0000 [IU] | Freq: Every day | INTRAMUSCULAR | Status: DC

## 2023-09-28 MED ORDER — INSULIN REGULAR(HUMAN) IN NACL 100-0.9 UT/100ML-% IV SOLN
INTRAVENOUS | Status: AC
Start: 2023-09-28 — End: 2023-09-28
  Administered 2023-09-28: 7.5 [IU]/h via INTRAVENOUS

## 2023-09-28 MED ORDER — CHLORHEXIDINE GLUCONATE CLOTH 2 % EX PADS
6.0000 | MEDICATED_PAD | Freq: Every day | CUTANEOUS | Status: DC
  Administered 2023-09-28: 6 via TOPICAL

## 2023-09-28 MED ORDER — SODIUM CHLORIDE 0.9 % IV SOLN: INTRAVENOUS | Status: DC

## 2023-09-28 NOTE — TOC CM/SW Note (Addendum)
Transition of Care [facility]) - Inpatient Brief Assessment   Patient Details  Name: Tom Anderson MRN: [id number] Date of Birth: October 24, 1971  Transition of Care [facility]) CM/SW Contact:    Tom-Johnson, Hershal Coria, RN Phone Number: 09/28/2023, 12:12 PM   Clinical Narrative:  Patient presented to the ED after taking 5 doses of Trazodone the night prior and He also took 3 Extra Strength Tylenol at the same time. Patient c/o feeling horrible and sick to his Stomach. Admitted with DKA. Patient non-compliant with Diabetic regimen. Completed IV abx.  Has hx of hx of Aortic Thrombus, on Xarelto.  Diabetic Coordinator following. Has outpatient Endocrinologist.    No TOC needs or recommendations noted at this time.  Patient not Medically ready for discharge.  CM will continue to follow as patient progresses with care towards discharge.         Transition of Care Asessment: Insurance and Status: Insurance coverage has been reviewed Patient has primary care physician: Yes Home environment has been reviewed: Yes Prior level of function:: Independent Prior/Current Home Services: No current home services Social Determinants of Health Reivew: SDOH reviewed no interventions necessary Readmission risk has been reviewed: Yes Transition of care needs: no transition of care needs at this time

## 2023-09-28 NOTE — Discharge Instructions (Addendum)
Thank you for letting us care for you during your stay.  You were admitted to the medical ICU for diabetic ketoacidosis.   Upon discharge please take: Levemir 16 units twice per day Novolog per sliding scale with meals Xarelto for the clot near your heart  Please follow up with your endocrinologist to discuss your diabetes.  Please call your vascular surgeon to schedule an office appointment to discuss the clot near your heart.   Please follow up with your primary care physician in 1 week.   If your symptoms worsen or return, please return to the hospital.  Please let us know if you have questions about your stay at [facility].

## 2023-09-28 NOTE — Discharge Summary (Signed)
Physician Discharge Summary         Patient ID: Tom Anderson MRN: [id number] DOB/AGE: March 14, 1971 52 y.o.  Admit date: 09/27/2023 Discharge date: 09/28/2023  Discharge Diagnoses:    Active Hospital Problems   Diagnosis Date Noted   DKA (diabetic ketoacidosis) (HCC) 10/03/2020    Priority: 1.   Drug overdose of undetermined intent 09/28/2023    Resolved Hospital Problems  No resolved problems to display.      Discharge summary   Tom Anderson is a 52 y.o. male who has a PMH as below including but not limited to depression, schizophrenia. He presented to [facility] 10/29 with reports of taking 5 doses of Trazodone the night prior, unknown exact time. He also took 3 extra strength Tylenol at the time. In ED, he reported feeling horrible and sick to his stomach. He reported to EDP that he "did something stupid" and that it was "not an act of war, it was an act of boredom". He denied any SI/HI. Did report using marijuana but denied additional drug use. In triage, he initially reported ecstasy use but later stated that he misspoke.   Workup in ED suggestive of DKA. UDS positive for THC. Poison control was contacted and recommended supportive care incase of Trazodone overdose with monitoring for sedation, QTc prolongation, bradycardia, hypotension, seizures. Recommending repeat EKG in 4 hours.   He had recent admission earlier this month for DKA. Also of note, he has a hx of aortic thrombus and on latest d/c summary from 09/05/23, there is mention that pt reported non-compliance with Xarelto since April just after diagnosis (only took it for a short time after diagnosis and also missed f/u appointment with vascular surgery in May).  Diabetes with hyperglycemia and diabetic ketoacidosis In the setting of insulin nonadherence.  Presents with some AMS, mental status quickly improved. Initial lab work showed NA 119, K6.6, bicarb <7, glucose >1,200, elevated LFTs. VBG showed pH 7.0, pCO2 16, PaO2 76  and bicarb 4. Consistent with DKA. Started on Endo tool, patient quickly transitioned to subcu insulin on 10/29 when anion gap closed. BMPs for every 4 hour monitoring, lab work normalized quickly. Received antibiotics in the ED for initial concern of infectious trigger, no trigger identified, most likely in the setting of medication nonadherence and therefore not continued.   Acute kidney injury Due to dehydration in setting of hyperglycemia. Creatinine improved to baseline with aggressive fluid resuscitation.  Possible trazodone overdose Patient reports taking 5 trazodone about 24 hours prior to admission.  Denies suicidal intent.  Poison control consulted.  Cardiac monitoring with normal rhythm and without prolonged QT intervals.  Cleared by poison control no further follow-up needed.   History of nonocclusive aortic thrombus Noncompliant on the Xarelto. Resume Xarelto and recommended VVS follow-up outpatient.   History of schizophrenia, depression Reportedly prescribed Cymbalta, other medications but he denies currently taking them.  Did not resume during hospital course.  Discharge Plan by Active Problems    Diabetes with hyperglycemia and diabetic ketoacidosis In the setting of insulin nonadherence. -Discharged on Levemir 16 units twice daily and NovoLog sliding scale   Acute kidney injury -Kidney function normalized, encourage oral hydration   History of nonocclusive aortic thrombus:  -Continue on Xarelto and follow-up with previous outpatient   History of schizophrenia, depression Encouraged outpatient psychiatric follow-up  Procedures   None  Consults  None    Discharge Exam: BP 121/77   Pulse 96   Temp 98.6 F (37 C) (Oral)   Resp  16   Ht 5' 9.5" (1.765 m)   Wt 56.2 kg   SpO2 93%   BMI 18.03 kg/m   General: Appears older than stated age, no acute distress HENT: [place]/AT, anicteric sclera. Lungs: Mild expiratory wheezing. Cardiovascular: RRR without  murmur Abdomen: Soft, mildly tender to palpation diffusely.  Nondistended.  + BS Extremities: Well-perfused no peripheral edema. Neuro: Alert and oriented x 3.  Appropriately responds to commands and engages in conversation.  Moves all extremities equally.  Labs at discharge   Lab Results  Component Value Date   CREATININE 0.94 09/28/2023   BUN 19 09/28/2023   NA 133 (L) 09/28/2023   K 3.5 09/28/2023   CL 100 09/28/2023   CO2 24 09/28/2023   Lab Results  Component Value Date   WBC 17.8 (H) 09/28/2023   HGB 10.8 (L) 09/28/2023   HCT 30.9 (L) 09/28/2023   MCV 93.1 09/28/2023   PLT 287 09/28/2023   Lab Results  Component Value Date   ALT 106 (H) 09/27/2023   AST 51 (H) 09/27/2023   ALKPHOS 213 (H) 09/27/2023   BILITOT 1.4 (H) 09/27/2023   Lab Results  Component Value Date   INR 1.0 03/25/2023    Current radiological studies    DG Chest Portable 1 View Result Date: 09/28/2023 IMPRESSION: No acute cardiopulmonary disease.   Disposition:  Home      Allergies as of 09/28/2023       Reactions   Nitrous Oxide Nausea And Vomiting   Latex Rash        Medication List     TAKE these medications    acetaminophen 325 MG tablet Commonly known as: TYLENOL Take 2 tablets (650 mg total) by mouth every 6 (six) hours as needed for mild pain (or Fever >/= 101).   Blood Glucose Monitoring Suppl Devi 1 each by Does not apply route 3 (three) times daily. May dispense any manufacturer covered by patient's insurance.   BLOOD GLUCOSE TEST STRIPS Strp 1 each by Does not apply route 3 (three) times daily. Use as directed to check blood sugar. May dispense any manufacturer covered by patient's insurance and fits patient's device.   Dexcom G6 Sensor Misc Inject 1 Device into the skin See admin instructions. Place 1 new sensor into the skin every 10 days   folic acid 1 MG tablet Commonly known as: FOLVITE Take 1 tablet (1 mg total) by mouth daily.   insulin aspart 100  UNIT/ML FlexPen Commonly known as: NOVOLOG Inject 3 Units into the skin 3 (three) times daily with meals. Do not take if you do not eat a meal.   insulin detemir 100 UNIT/ML injection Commonly known as: LEVEMIR Inject 0.16 mLs (16 Units total) into the skin 2 (two) times daily. What changed: how much to take   Lancet Device Misc 1 each by Does not apply route 3 (three) times daily. May dispense any manufacturer covered by patient's insurance.   Lancets Misc 1 each by Does not apply route 3 (three) times daily. Use as directed to check blood sugar. May dispense any manufacturer covered by patient's insurance and fits patient's device.   ondansetron 4 MG tablet Commonly known as: ZOFRAN Take 1 tablet (4 mg total) by mouth every 6 (six) hours as needed for nausea.   pantoprazole 40 MG tablet Commonly known as: PROTONIX Take 1 tablet (40 mg total) by mouth daily.   Pen Needles 31G X 5 MM Misc 1 each by Does not apply route  3 (three) times daily. May dispense any manufacturer covered by patient's insurance.   rivaroxaban 20 MG Tabs tablet Commonly known as: XARELTO Take 1 tablet (20 mg total) by mouth daily with supper.   Ventolin HFA 108 (90 Base) MCG/ACT inhaler Generic drug: albuterol Inhale 2 puffs into the lungs every 4 (four) hours as needed for wheezing or shortness of breath.         Follow-up appointment   Endocrinologist for diabetes management VVS for nonocclusive aortic thrombus   Discharge Condition:    good    Signed: Elberta Fortis 09/28/2023, 2:49 PM

## 2023-09-28 NOTE — ED Notes (Signed)
Patient has been assigned a bed   [facility] is dispatching a truck now  No Current ETA.Marland KitchenMarland Kitchen

## 2023-09-28 NOTE — Progress Notes (Signed)
Discharge instructions discussed with patient and father all questions answered.  Patient wheeled out by [place] NT

## 2023-09-28 NOTE — H&P (Signed)
NAME:  Tom Anderson, MRN:  [id number], DOB:  Apr 16, 1971, LOS: 0 ADMISSION DATE:  09/27/2023, CONSULTATION DATE:  09/28/23 REFERRING MD:  Jearld Fenton CHIEF COMPLAINT:  AMS   History of Present Illness:  Tom Anderson is a 52 y.o. male who has a PMH as below including but not limited to depression, schizophrenia. He presented to [facility] 10/29 with reports of taking 5 doses of Trazodone the night prior, unknown exact time. He also took 3 extra strength Tylenol at the time. In ED, he reported feeling horrible and sick to his stomach. He reported to EDP that he "did something stupid" and that it was "not an act of war, it was an act of boredom". He denied any SI/HI. Did report using marijuana but denied additional drug use. In triage, he initially reported ecstasy use but later stated that he misspoke.  Workup in ED suggestive of DKA. UDS positive for THC. Poison control was contacted and recommended supportive care incase of Trazodone overdose with monitoring for sedation, QTc prolongation, bradycardia, hypotension, seizures. Recommending repeat EKG in 4 hours.  He had recent admission earlier this month for DKA. Also of note, he has a hx of aortic thrombus and on latest d/c summary from 09/05/23, there is mention that pt reported non-compliance with Xarelto since April just after diagnosis (only took it for a short time after diagnosis and also missed f/u appointment with vascular surgery in May).  Pertinent  Medical History:  has DKA (diabetic ketoacidosis) (HCC); Acute GI bleeding; High anion gap metabolic acidosis; AKI (acute kidney injury) (HCC); Acute respiratory failure with hypoxia and hypercapnia (HCC); Acute metabolic encephalopathy; Uncontrolled type 2 diabetes mellitus with hyperglycemia, with long-term current use of insulin (HCC); Schizophrenia (HCC); Malnutrition of moderate degree; Chronic fatigue; Moderate cannabis use disorder (HCC); Polysubstance abuse (HCC); Tardive dyskinesia; Suicidal  ideation; Major depression; Epigastric pain; Essential tremor; Dyspepsia; Cigarette nicotine dependence without complication; GERD without esophagitis; Hyponatremia; COVID-19 virus infection; Major depressive disorder, recurrent episode, mild (HCC); Aortic thrombus in the descending thoracic and distal abdominal aorta; Chronic systolic CHF (congestive heart failure) (HCC); Depressed left ventricular ejection fraction; Transaminitis; and Hyperkalemia on their problem list.  Significant Hospital Events: Including procedures, antibiotic start and stop dates in addition to other pertinent events   10/29 admit  Interim History / Subjective:  Feels better. Asking for ice chips as he is thirsty. Tells me that he took extra Trazodone as a means of trying to "have a good time". Denies any attempt of self harm.  Objective:  Blood pressure (!) 151/93, pulse (!) 117, temperature 97.8 F (36.6 C), temperature source Oral, resp. rate 16, height 5\' 10"  (1.778 m), weight 72.6 kg, SpO2 100%.        Intake/Output Summary (Last 24 hours) at 09/28/2023 0106 Last data filed at 09/27/2023 2357 Gross per 24 hour  Intake 1300.15 ml  Output --  Net 1300.15 ml   Filed Weights   09/27/23 2047  Weight: 72.6 kg    Examination: General: Adult male, resting in bed, in NAD. Neuro: A&O x 3, no deficits. HEENT: [place]/AT. Sclerae anicteric. EOMI. MM dry. Cardiovascular: Tachy, regular, no M/R/G.  Lungs: Respirations even and unlabored.  CTA bilaterally, No W/R/R. Abdomen: BS x 4, soft, NT/ND.  Musculoskeletal: No gross deformities, no edema.  Skin: Intact, warm, no rashes.   Assessment & Plan:   DKA - unclear etiology. No obvious signs of infection. - Continue fluids and insulin per DKA protocol. - Frequent BMP's. - Defer abx for now (  did receive 1 dose Vanc/Cefepime in ED).  AKI - 2/2 above. Hyperkalemia - 2/2 above. AGMA - 2/2 above. - Fluids, additional 2L bolus now then maintenance. - Frequent  BMP's. - Repeat lactate.  Possible Trazodone overdose - reportedly took 5 doses 1 night prior to admit. - Supportive care per poison control. - Repeat EKG, monitor QTc.  Hx Non Occlusive Aortic Thrombus - supposed to be on Xarelto but apparently non-compliant since April. - Hold Xarelto for now since he has not been on it (was supposed to take for 3 months following diagnosis in April but reportedly stopped shortly after diagnosis). - Day team to please consult Vascular (had a f/u appointment in May which he missed). - Probably needs repeat CTA.  THC use. - Cessation counseling.  Hx Schizophrenia, Depression. - Supportive care. - Hold PTA Aripiprazole, Trazodone.  Best practice (evaluated daily):  Diet/type: NPO DVT prophylaxis: prophylactic heparin  GI prophylaxis: N/A Lines: N/A Foley:  N/A Code Status:  full code Last date of multidisciplinary goals of care discussion: None yet.  Labs   CBC: Recent Labs  Lab 09/27/23 2123 09/27/23 2129  WBC 16.0*  --   NEUTROABS 12.2*  --   HGB 12.8* 15.6  HCT 42.2 46.0  MCV 108.5*  --   PLT 399  --     Basic Metabolic Panel: Recent Labs  Lab 09/27/23 2123 09/27/23 2129 09/27/23 2249  NA 119* 117* 120*  K 6.6* 6.9* 7.3*  CL 82*  --  84*  CO2 <7*  --  <7*  GLUCOSE >1,200*  --  >1,200*  BUN 29*  --  30*  CREATININE 1.88*  --  1.94*  CALCIUM 8.3*  --  8.6*   GFR: Estimated Creatinine Clearance: 46.8 mL/min (A) (by C-G formula based on SCr of 1.94 mg/dL (H)). Recent Labs  Lab 09/27/23 2123 09/27/23 2323  WBC 16.0*  --   LATICACIDVEN 7.6* 7.1*    Liver Function Tests: Recent Labs  Lab 09/27/23 2123  AST 51*  ALT 106*  ALKPHOS 213*  BILITOT 1.4*  PROT 7.3  ALBUMIN 4.7   No results for input(s): "LIPASE", "AMYLASE" in the last 168 hours. No results for input(s): "AMMONIA" in the last 168 hours.  ABG    Component Value Date/Time   PHART 7.485 (H) 10/04/2020 0210   PCO2ART 25.5 (L) 10/04/2020 0210    PO2ART 152 (H) 10/04/2020 0210   HCO3 4.1 (L) 09/27/2023 2129   TCO2 <5 (L) 09/27/2023 2129   ACIDBASEDEF 26.0 (H) 09/27/2023 2129   O2SAT 87 09/27/2023 2129     Coagulation Profile: No results for input(s): "INR", "PROTIME" in the last 168 hours.  Cardiac Enzymes: No results for input(s): "CKTOTAL", "CKMB", "CKMBINDEX", "TROPONINI" in the last 168 hours.  HbA1C: Hgb A1c MFr Bld  Date/Time Value Ref Range Status  08/27/2023 06:26 AM 12.2 (H) 4.8 - 5.6 % Final    Comment:    (NOTE) Pre diabetes:          5.7%-6.4%  Diabetes:              >6.4%  Glycemic control for   <7.0% adults with diabetes   03/22/2023 02:05 AM 14.2 (H) 4.8 - 5.6 % Final    Comment:    (NOTE) Pre diabetes:          5.7%-6.4%  Diabetes:              >6.4%  Glycemic control for   <7.0% adults with  diabetes     CBG: Recent Labs  Lab 09/27/23 2046 09/27/23 2316 09/27/23 2347 09/28/23 0019 09/28/23 0048  GLUCAP >600* >600* >600* >600* >600*    Review of Systems:   All negative; except for those that are bolded, which indicate positives.  Constitutional: weight loss, weight gain, night sweats, fevers, chills, fatigue, weakness.  HEENT: headaches, sore throat, sneezing, nasal congestion, post nasal drip, difficulty swallowing, tooth/dental problems, visual complaints, visual changes, ear aches, thirsty. Neuro: difficulty with speech, weakness, numbness, ataxia. CV:  chest pain, orthopnea, PND, swelling in lower extremities, dizziness, palpitations, syncope.  Resp: cough, hemoptysis, dyspnea, wheezing. GI: heartburn, indigestion, abdominal pain, nausea, vomiting, diarrhea, constipation, change in bowel habits, loss of appetite, hematemesis, melena, hematochezia.  GU: dysuria, change in color of urine, urgency or frequency, flank pain, hematuria. MSK: joint pain or swelling, decreased range of motion. Psych: change in mood or affect, depression, anxiety, suicidal ideations, homicidal  ideations. Skin: rash, itching, bruising.   Past Medical History:  He,  has a past medical history of Asthma, Depression, Diabetes mellitus without complication (HCC), Schizophrenia (HCC), and Uses self-applied continuous glucose monitoring device.   Surgical History:   Past Surgical History:  Procedure Laterality Date   HERNIA REPAIR       Social History:   reports that he has been smoking cigarettes. He has never used smokeless tobacco. He reports current alcohol use. He reports current drug use. Drug: Marijuana.   Family History:  His family history includes Diabetes in his maternal grandfather.   Allergies Allergies  Allergen Reactions   Nitrous Oxide Nausea And Vomiting   Latex Rash     Home Medications  Prior to Admission medications   Medication Sig Start Date End Date Taking? Authorizing Provider  acetaminophen (TYLENOL) 325 MG tablet Take 2 tablets (650 mg total) by mouth every 6 (six) hours as needed for mild pain (or Fever >/= 101). Patient not taking: Reported on 09/04/2023 05/22/23   Meredeth Ide, MD  ARIPiprazole (ABILIFY) 10 MG tablet Take 1 tablet (10 mg total) by mouth daily. Patient not taking: Reported on 09/04/2023 03/23/23   Rai, Delene Ruffini, MD  Blood Glucose Monitoring Suppl DEVI 1 each by Does not apply route 3 (three) times daily. May dispense any manufacturer covered by patient's insurance. 08/27/23   Rai, Delene Ruffini, MD  Continuous Glucose Sensor (DEXCOM G6 SENSOR) MISC Inject 1 Device into the skin See admin instructions. Place 1 new sensor into the skin every 10 days Patient not taking: Reported on 09/04/2023 05/15/23   [provider]  folic acid (FOLVITE) 1 MG tablet Take 1 tablet (1 mg total) by mouth daily. Patient not taking: Reported on 09/04/2023 03/23/23   Rai, Delene Ruffini, MD  Glucose Blood (BLOOD GLUCOSE TEST STRIPS) STRP 1 each by Does not apply route 3 (three) times daily. Use as directed to check blood sugar. May dispense any  manufacturer covered by patient's insurance and fits patient's device. 08/27/23   Rai, Ripudeep K, MD  insulin aspart (NOVOLOG) 100 UNIT/ML FlexPen Inject 3 Units into the skin 3 (three) times daily with meals. Do not take if you do not eat a meal. 09/05/23   Danford, Earl Lites, MD  Insulin Pen Needle (PEN NEEDLES) 31G X 5 MM MISC 1 each by Does not apply route 3 (three) times daily. May dispense any manufacturer covered by patient's insurance. 09/05/23   Danford, Earl Lites, MD  Lancet Device MISC 1 each by Does not apply route 3 (  three) times daily. May dispense any manufacturer covered by patient's insurance. 08/27/23   Rai, Delene Ruffini, MD  Lancets MISC 1 each by Does not apply route 3 (three) times daily. Use as directed to check blood sugar. May dispense any manufacturer covered by patient's insurance and fits patient's device. 08/27/23   Rai, Ripudeep K, MD  LEVEMIR 100 UNIT/ML injection Inject 12 Units into the skin 2 (two) times daily.    [provider]  nicotine (NICODERM CQ - DOSED IN MG/24 HOURS) 21 mg/24hr patch Place 1 patch (21 mg total) onto the skin daily. Patient not taking: Reported on 05/21/2023 05/03/23   Burnadette Pop, MD  ondansetron (ZOFRAN) 4 MG tablet Take 1 tablet (4 mg total) by mouth every 6 (six) hours as needed for nausea. Patient not taking: Reported on 09/04/2023 08/27/23   Rai, Delene Ruffini, MD  pantoprazole (PROTONIX) 40 MG tablet Take 1 tablet (40 mg total) by mouth daily. Patient not taking: Reported on 09/04/2023 08/27/23   Rai, Delene Ruffini, MD  rivaroxaban (XARELTO) 20 MG TABS tablet Take 1 tablet (20 mg total) by mouth daily with supper. Take this when the xarelto starter pack is completed. Patient not taking: Reported on 05/21/2023 04/12/23   Rai, Delene Ruffini, MD  traZODone (DESYREL) 50 MG tablet Take 1 tablet (50 mg total) by mouth at bedtime. Patient not taking: Reported on 05/21/2023 03/24/23   Russella Dar, NP  VENTOLIN HFA 108 (90 Base) MCG/ACT inhaler  Inhale 2 puffs into the lungs every 4 (four) hours as needed for wheezing or shortness of breath. 05/22/23   Meredeth Ide, MD     Critical care time: 35 minutes.   Rutherford Guys, PA - [facility] For pager details, please see AMION or use Epic chat  After 1900, please call [facility] for cross coverage needs 09/28/2023, 1:06 AM

## 2023-09-28 NOTE — Inpatient Diabetes Management (Addendum)
Inpatient Diabetes Program Recommendations  AACE/ADA: New Consensus Statement on Inpatient Glycemic Control (2015)  Target Ranges:  Prepandial:   less than 140 mg/dL      Peak postprandial:   less than 180 mg/dL (1-2 hours)      Critically ill patients:  140 - 180 mg/dL   Lab Results  Component Value Date   GLUCAP 223 (H) 09/28/2023   HGBA1C 12.2 (H) 08/27/2023    Review of Glycemic Control  Latest Reference Range & Units 09/28/23 04:14 09/28/23 05:18 09/28/23 06:44 09/28/23 08:12 09/28/23 09:23  Glucose-Capillary 70 - 99 mg/dL 811 (H) 914 (H) 782 (H) 231 (H) 223 (H)   Diabetes history: DM  Outpatient Diabetes medications:  Novolog 0-8 units tid with meals (1 unit/15 grams CHO) and 1 unit for every 50 mg/dL >956 mg/dL Lantus 9 units daily Current orders for Inpatient glycemic control:  IV insulin-   Inpatient Diabetes Program Recommendations:    Note patient had hospital admit both 08/26/23 and 09/04/23 for DKA.  He was counseled during the 08/26/23 stay on how to take insulin including CHO coverage and correction.  Also basal insulin was switched to Lantus due to Levemir no longer being manufactured.    Acidosis clearing.  For transition off insulin drip, consider Semglee 10 units daily and Novolog very sensitive correction (0-6 units) q 4 hours. Once eating, will need meal coverage as well- Consider Novolog 3 units tid with meals (hold if patient eats less than 50% or NPO).   Will follow.   Thanks,  Beryl Meager, RN, BC-ADM Inpatient Diabetes Coordinator Pager [phone] (8a-5p)

## 2023-09-28 NOTE — Progress Notes (Signed)
NAME:  Tom Anderson, MRN:  [id number], DOB:  1971-08-15, LOS: 0 ADMISSION DATE:  09/27/2023, CONSULTATION DATE:  09/28/2023 REFERRING MD:  Jearld Fenton, CHIEF COMPLAINT:  AMS   History of Present Illness:  Tom Anderson is a 52 y.o. male who has a PMH as below including but not limited to depression, schizophrenia. He presented to [facility] 10/29 with reports of taking 5 doses of Trazodone the night prior, unknown exact time. He also took 3 extra strength Tylenol at the time. In ED, he reported feeling horrible and sick to his stomach. He reported to EDP that he "did something stupid" and that it was "not an act of war, it was an act of boredom". He denied any SI/HI. Did report using marijuana but denied additional drug use. In triage, he initially reported ecstasy use but later stated that he misspoke.   Workup in ED suggestive of DKA. UDS positive for THC. Poison control was contacted and recommended supportive care incase of Trazodone overdose with monitoring for sedation, QTc prolongation, bradycardia, hypotension, seizures. Recommending repeat EKG in 4 hours.   He had recent admission earlier this month for DKA. Also of note, he has a hx of aortic thrombus and on latest d/c summary from 09/05/23, there is mention that pt reported non-compliance with Xarelto since April just after diagnosis (only took it for a short time after diagnosis and also missed f/u appointment with vascular surgery in May).  Pertinent  Medical History  Type 1 diabetes, uncontrolled Aortic thrombus, noncompliant with Xarelto Schizophrenia Depression Polysubstance use COPD?  Significant Hospital Events: Including procedures, antibiotic start and stop dates in addition to other pertinent events   10/29: Admitted to ICU; started on Endotool  Interim History / Subjective:  Reports taking insulin a few times per week, utilizing Levemir and NovoLog.  Has CGM at home but has not been wearing, not checking sugars.  Has outpatient  endocrinologist he follows with.  Objective   Blood pressure 125/87, pulse 99, temperature (!) 96.6 F (35.9 C), temperature source Axillary, resp. rate 17, height 5' 9.5" (1.765 m), weight 56.2 kg, SpO2 97%.        Intake/Output Summary (Last 24 hours) at 09/28/2023 0715 Last data filed at 09/28/2023 0600 Gross per 24 hour  Intake 3919.81 ml  Output 800 ml  Net 3119.81 ml   Filed Weights   09/27/23 2047 09/28/23 0135  Weight: 72.6 kg 56.2 kg    Examination: General: Appears older than stated age, no acute distress HENT: [place]/AT, anicteric sclera. Lungs: Mild expiratory wheezing. Cardiovascular: RRR without murmur Abdomen: Soft, mildly tender to palpation diffusely.  Nondistended.  + BS Extremities: Well-perfused no peripheral edema. Neuro: Alert and oriented x 3.  Appropriately responds to commands and engages in conversation.  Moves all extremities equally.  Resolved Hospital Problem list     Assessment & Plan:  DKA - likely secondary to inconsistent insulin use -Continue fluids and insulin per DKA protocol -NPO -BMP q4h -Given no obvious source of infection, will defer antibiotics at this time -When anion gap clears, will transition to subcutaneous insulin -Consider transferring to progressive care as DKA improves   AKI Hyperkalemia AGMA -MIVF -Strict I&O   Possible Trazodone overdose - reportedly took 5 doses, unintentional -No further follow-up per poison control -Telemetry monitoring   H/o Non-Occlusive Aortic Thrombus, noncompliant on Xarelto -Restart Xarelto -Consult VVS when clinically improving, will defer further decision about imaging to them   H/o Schizophrenia, Depression Aox3, baseline mentation. Will hold home psych  meds as patient is not taking them  Best Practice (right click and "Reselect all SmartList Selections" daily)   Diet/type: Regular consistency (see orders) DVT prophylaxis: prophylactic heparin  GI prophylaxis: N/A Lines:  N/A Foley:  N/A Code Status:  full code Last date of multidisciplinary goals of care discussion [10/29]  Labs   CBC: Recent Labs  Lab 09/27/23 2123 09/27/23 2129  WBC 16.0*  --   NEUTROABS 12.2*  --   HGB 12.8* 15.6  HCT 42.2 46.0  MCV 108.5*  --   PLT 399  --     Basic Metabolic Panel: Recent Labs  Lab 09/27/23 2123 09/27/23 2129 09/27/23 2249 09/28/23 0229  NA 119* 117* 120* 133*  K 6.6* 6.9* 7.3* 4.5  CL 82*  --  84* 97*  CO2 <7*  --  <7* 9*  GLUCOSE >1,200*  --  >1,200* 674*  BUN 29*  --  30* 30*  CREATININE 1.88*  --  1.94* 2.10*  CALCIUM 8.3*  --  8.6* 9.3   GFR: Estimated Creatinine Clearance: 33.5 mL/min (A) (by C-G formula based on SCr of 2.1 mg/dL (H)). Recent Labs  Lab 09/27/23 2123 09/27/23 2323 09/28/23 0229  WBC 16.0*  --   --   LATICACIDVEN 7.6* 7.1* 6.9*    Liver Function Tests: Recent Labs  Lab 09/27/23 2123  AST 51*  ALT 106*  ALKPHOS 213*  BILITOT 1.4*  PROT 7.3  ALBUMIN 4.7   No results for input(s): "LIPASE", "AMYLASE" in the last 168 hours. No results for input(s): "AMMONIA" in the last 168 hours.  ABG    Component Value Date/Time   PHART 7.485 (H) 10/04/2020 0210   PCO2ART 25.5 (L) 10/04/2020 0210   PO2ART 152 (H) 10/04/2020 0210   HCO3 4.1 (L) 09/27/2023 2129   TCO2 <5 (L) 09/27/2023 2129   ACIDBASEDEF 26.0 (H) 09/27/2023 2129   O2SAT 87 09/27/2023 2129     Coagulation Profile: No results for input(s): "INR", "PROTIME" in the last 168 hours.  Cardiac Enzymes: No results for input(s): "CKTOTAL", "CKMB", "CKMBINDEX", "TROPONINI" in the last 168 hours.  HbA1C: Hgb A1c MFr Bld  Date/Time Value Ref Range Status  08/27/2023 06:26 AM 12.2 (H) 4.8 - 5.6 % Final    Comment:    (NOTE) Pre diabetes:          5.7%-6.4%  Diabetes:              >6.4%  Glycemic control for   <7.0% adults with diabetes   03/22/2023 02:05 AM 14.2 (H) 4.8 - 5.6 % Final    Comment:    (NOTE) Pre diabetes:           5.7%-6.4%  Diabetes:              >6.4%  Glycemic control for   <7.0% adults with diabetes     CBG: Recent Labs  Lab 09/28/23 0307 09/28/23 0340 09/28/23 0414 09/28/23 0518 09/28/23 0644  GLUCAP 517* 420* 356* 246* 242*

## 2023-09-28 NOTE — TOC Transition Note (Addendum)
Transition of Care [facility]) - CM/SW Discharge Note   Patient Details  Name: Tom Anderson MRN: [id number] Date of Birth: 04-26-1971  Transition of Care [facility] CM/SW Contact:  Tom-Johnson, Hershal Coria, RN Phone Number: 09/28/2023, 3:23 PM   Clinical Narrative:     Patient is scheduled for discharge today.  Readmission Risk Assessment done. Hospital f/u and discharge instructions on AVS. No TOC needs or recommendations noted. Parents to transport at discharge.  No further TOC needs noted.        Final next level of care: Home/Self Care Barriers to Discharge: Barriers Resolved   Patient Goals and CMS Choice CMS Medicare.gov Compare Post Acute Care list provided to:: Patient Choice offered to / list presented to : NA  Discharge Placement                  Patient to be transferred to facility by: Parents Name of family member notified: Vicky    Discharge Plan and Services Additional resources added to the After Visit Summary for                  DME Arranged: N/A DME Agency: NA       HH Arranged: NA HH Agency: NA        Social Determinants of Health (SDOH) Interventions SDOH Screenings   Food Insecurity: No Food Insecurity (09/05/2023)  Housing: Low Risk  (09/05/2023)  Transportation Needs: No Transportation Needs (09/05/2023)  Utilities: Not At Risk (09/05/2023)  Tobacco Use: High Risk (09/27/2023)     Readmission Risk Interventions    09/28/2023   12:08 PM 08/26/2023    6:51 PM 02/10/2023    3:33 PM  Readmission Risk Prevention Plan  Transportation Screening Complete Complete Complete  PCP or Specialist Appt within 3-5 Days   Complete  HRI or Home Care Consult   Complete  Social Work Consult for Recovery Care Planning/Counseling   Complete  Palliative Care Screening   Not Applicable  Medication Review [facility]) Referral to Pharmacy Complete Complete  PCP or Specialist appointment within 3-5 days of discharge Complete Complete   HRI  or Home Care Consult Complete Complete   SW Recovery Care/Counseling Consult Complete Complete   Palliative Care Screening Not Applicable Not Applicable   Skilled Nursing Facility Not Applicable Not Applicable

## 2023-09-28 NOTE — Progress Notes (Signed)
Poison control called for pt update. Stated patient is now out of the 6 hours window for symptoms. Will not be checking back but to contact if needed further

## 2023-09-28 NOTE — Progress Notes (Signed)
eLink Physician-Brief Progress Note Patient Name: Tom Anderson DOB: 1970/12/01 MRN: [id number]   Date of Service  09/28/2023  HPI/Events of Note  Patient admitted with poly-drug overdose.  eICU Interventions  New Patient Evaluation.        [name] 09/28/2023, 1:39 AM

## 2023-10-01 ENCOUNTER — Inpatient Hospital Stay (HOSPITAL_BASED_OUTPATIENT_CLINIC_OR_DEPARTMENT_OTHER)
Admission: EM | Admit: 2023-10-01 | Discharge: 2023-10-03 | DRG: 638 | Disposition: A | Discharge status: Home / Self Care | Payer: MEDICAID | Attending: Pulmonary Disease | Admitting: Pulmonary Disease

## 2023-10-01 ENCOUNTER — Encounter (HOSPITAL_BASED_OUTPATIENT_CLINIC_OR_DEPARTMENT_OTHER): Payer: Self-pay | Admitting: Urology

## 2023-10-01 ENCOUNTER — Other Ambulatory Visit: Payer: Self-pay

## 2023-10-01 ENCOUNTER — Emergency Department (HOSPITAL_BASED_OUTPATIENT_CLINIC_OR_DEPARTMENT_OTHER): Payer: MEDICAID

## 2023-10-01 DIAGNOSIS — Z833 Family history of diabetes mellitus: Secondary | ICD-10-CM | POA: Diagnosis not present

## 2023-10-01 DIAGNOSIS — Z91128 Patient's intentional underdosing of medication regimen for other reason: Secondary | ICD-10-CM

## 2023-10-01 DIAGNOSIS — Z888 Allergy status to other drugs, medicaments and biological substances status: Secondary | ICD-10-CM | POA: Diagnosis not present

## 2023-10-01 DIAGNOSIS — F17210 Nicotine dependence, cigarettes, uncomplicated: Secondary | ICD-10-CM | POA: Diagnosis present

## 2023-10-01 DIAGNOSIS — F32A Depression, unspecified: Secondary | ICD-10-CM | POA: Diagnosis present

## 2023-10-01 DIAGNOSIS — N179 Acute kidney failure, unspecified: Secondary | ICD-10-CM | POA: Diagnosis present

## 2023-10-01 DIAGNOSIS — I7410 Embolism and thrombosis of unspecified parts of aorta: Secondary | ICD-10-CM | POA: Diagnosis present

## 2023-10-01 DIAGNOSIS — F209 Schizophrenia, unspecified: Secondary | ICD-10-CM | POA: Diagnosis present

## 2023-10-01 DIAGNOSIS — E1110 Type 2 diabetes mellitus with ketoacidosis without coma: Secondary | ICD-10-CM | POA: Diagnosis present

## 2023-10-01 DIAGNOSIS — Z794 Long term (current) use of insulin: Secondary | ICD-10-CM | POA: Diagnosis not present

## 2023-10-01 DIAGNOSIS — G2401 Drug induced subacute dyskinesia: Secondary | ICD-10-CM | POA: Diagnosis present

## 2023-10-01 DIAGNOSIS — E871 Hypo-osmolality and hyponatremia: Secondary | ICD-10-CM | POA: Diagnosis present

## 2023-10-01 DIAGNOSIS — R45851 Suicidal ideations: Secondary | ICD-10-CM | POA: Diagnosis present

## 2023-10-01 DIAGNOSIS — T383X6A Underdosing of insulin and oral hypoglycemic [antidiabetic] drugs, initial encounter: Secondary | ICD-10-CM | POA: Diagnosis present

## 2023-10-01 DIAGNOSIS — E875 Hyperkalemia: Secondary | ICD-10-CM | POA: Diagnosis present

## 2023-10-01 DIAGNOSIS — J45909 Unspecified asthma, uncomplicated: Secondary | ICD-10-CM | POA: Diagnosis present

## 2023-10-01 DIAGNOSIS — Z1152 Encounter for screening for COVID-19: Secondary | ICD-10-CM | POA: Diagnosis not present

## 2023-10-01 DIAGNOSIS — Z7901 Long term (current) use of anticoagulants: Secondary | ICD-10-CM

## 2023-10-01 DIAGNOSIS — Z91040 Latex allergy status: Secondary | ICD-10-CM

## 2023-10-01 DIAGNOSIS — E1010 Type 1 diabetes mellitus with ketoacidosis without coma: Secondary | ICD-10-CM | POA: Diagnosis not present

## 2023-10-01 DIAGNOSIS — Z91148 Patient's other noncompliance with medication regimen for other reason: Secondary | ICD-10-CM

## 2023-10-01 LAB — RAPID URINE DRUG SCREEN, HOSP PERFORMED
Amphetamines: NOT DETECTED
Amphetamines: NOT DETECTED
Barbiturates: NOT DETECTED
Barbiturates: NOT DETECTED
Benzodiazepines: NOT DETECTED
Benzodiazepines: NOT DETECTED
Cocaine: NOT DETECTED
Cocaine: NOT DETECTED
Opiates: NOT DETECTED
Opiates: NOT DETECTED
Tetrahydrocannabinol: POSITIVE — AB
Tetrahydrocannabinol: POSITIVE — AB

## 2023-10-01 LAB — URINALYSIS, ROUTINE W REFLEX MICROSCOPIC
Bilirubin Urine: NEGATIVE
Bilirubin Urine: NEGATIVE
Glucose, UA: >=500 mg/dL — AB
Glucose, UA: >=500 mg/dL — AB
Hgb urine dipstick: NEGATIVE
Ketones, ur: >=80 mg/dL — AB
Ketones, ur: 80 mg/dL — AB
Leukocytes,Ua: NEGATIVE
Leukocytes,Ua: NEGATIVE
Nitrite: NEGATIVE
Nitrite: NEGATIVE
Protein, ur: 30 mg/dL — AB
Protein, ur: NEGATIVE mg/dL
Specific Gravity, Urine: 1.02 (ref 1.005–1.030)
Specific Gravity, Urine: 1.02 (ref 1.005–1.030)
pH: 5 (ref 5.0–8.0)
pH: 5 (ref 5.0–8.0)

## 2023-10-01 LAB — HEPATIC FUNCTION PANEL
ALT: 127 U/L — ABNORMAL HIGH (ref 0–44)
AST: 82 U/L — ABNORMAL HIGH (ref 15–41)
Albumin: 4.7 g/dL (ref 3.5–5.0)
Alkaline Phosphatase: 251 U/L — ABNORMAL HIGH (ref 38–126)
Bilirubin, Direct: 0.1 mg/dL (ref 0.0–0.2)
Indirect Bilirubin: 1.3 mg/dL — ABNORMAL HIGH (ref 0.3–0.9)
Total Bilirubin: 1.4 mg/dL — ABNORMAL HIGH (ref 0.3–1.2)
Total Protein: 7.6 g/dL (ref 6.5–8.1)

## 2023-10-01 LAB — URINALYSIS, MICROSCOPIC (REFLEX)

## 2023-10-01 LAB — I-STAT VENOUS BLOOD GAS, ED
Acid-base deficit: 25 mmol/L — ABNORMAL HIGH (ref 0.0–2.0)
Bicarbonate: 4.7 mmol/L — ABNORMAL LOW (ref 20.0–28.0)
Calcium, Ion: 1.12 mmol/L — ABNORMAL LOW (ref 1.15–1.40)
HCT: 46 % (ref 39.0–52.0)
Hemoglobin: 15.6 g/dL (ref 13.0–17.0)
O2 Saturation: 59 %
Patient temperature: 96.4
Potassium: 6.1 mmol/L — ABNORMAL HIGH (ref 3.5–5.1)
Sodium: 122 mmol/L — ABNORMAL LOW (ref 135–145)
TCO2: 5 mmol/L — ABNORMAL LOW (ref 22–32)
pCO2, Ven: 18.4 mm[Hg] — CL (ref 44–60)
pH, Ven: 7.003 — CL (ref 7.25–7.43)
pO2, Ven: 42 mm[Hg] (ref 32–45)

## 2023-10-01 LAB — BASIC METABOLIC PANEL
Anion gap: 25 — ABNORMAL HIGH (ref 5–15)
BUN: 28 mg/dL — ABNORMAL HIGH (ref 6–20)
BUN: 26 mg/dL — ABNORMAL HIGH (ref 6–20)
CO2: <7 mmol/L — ABNORMAL LOW (ref 22–32)
CO2: 11 mmol/L — ABNORMAL LOW (ref 22–32)
Calcium: 8.9 mg/dL (ref 8.9–10.3)
Calcium: 9.5 mg/dL (ref 8.9–10.3)
Chloride: 86 mmol/L — ABNORMAL LOW (ref 98–111)
Chloride: 97 mmol/L — ABNORMAL LOW (ref 98–111)
Creatinine, Ser: 1.72 mg/dL — ABNORMAL HIGH (ref 0.61–1.24)
Creatinine, Ser: 1.79 mg/dL — ABNORMAL HIGH (ref 0.61–1.24)
GFR, Estimated: 48 mL/min — ABNORMAL LOW (ref >60)
GFR, Estimated: 46 mL/min — ABNORMAL LOW (ref >60)
Glucose, Bld: >1200 mg/dL (ref 70–99)
Glucose, Bld: 523 mg/dL (ref 70–99)
Potassium: 5.9 mmol/L — ABNORMAL HIGH (ref 3.5–5.1)
Potassium: 3.9 mmol/L (ref 3.5–5.1)
Sodium: 125 mmol/L — ABNORMAL LOW (ref 135–145)
Sodium: 133 mmol/L — ABNORMAL LOW (ref 135–145)

## 2023-10-01 LAB — CBC
HCT: 43.4 % (ref 39.0–52.0)
Hemoglobin: 13.9 g/dL (ref 13.0–17.0)
MCH: 32.8 pg (ref 26.0–34.0)
MCHC: 32 g/dL (ref 30.0–36.0)
MCV: 102.4 fL — ABNORMAL HIGH (ref 80.0–100.0)
Platelets: 324 10*3/uL (ref 150–400)
RBC: 4.24 MIL/uL (ref 4.22–5.81)
RDW: 12.9 % (ref 11.5–15.5)
WBC: 9.3 10*3/uL (ref 4.0–10.5)
nRBC: 0 % (ref 0.0–0.2)

## 2023-10-01 LAB — SALICYLATE LEVEL: Salicylate Lvl: <7 mg/dL — ABNORMAL LOW (ref 7.0–30.0)

## 2023-10-01 LAB — GLUCOSE, CAPILLARY
Glucose-Capillary: >600 mg/dL (ref 70–99)
Glucose-Capillary: 524 mg/dL (ref 70–99)
Glucose-Capillary: 431 mg/dL — ABNORMAL HIGH (ref 70–99)
Glucose-Capillary: 486 mg/dL — ABNORMAL HIGH (ref 70–99)
Glucose-Capillary: 333 mg/dL — ABNORMAL HIGH (ref 70–99)
Glucose-Capillary: 225 mg/dL — ABNORMAL HIGH (ref 70–99)

## 2023-10-01 LAB — CBG MONITORING, ED
Glucose-Capillary: >600 mg/dL (ref 70–99)
Glucose-Capillary: >600 mg/dL (ref 70–99)

## 2023-10-01 LAB — RESP PANEL BY RT-PCR (RSV, FLU A&B, COVID)  RVPGX2
Influenza A by PCR: NEGATIVE
Influenza B by PCR: NEGATIVE
Resp Syncytial Virus by PCR: NEGATIVE
SARS Coronavirus 2 by RT PCR: NEGATIVE

## 2023-10-01 LAB — LIPASE, BLOOD: Lipase: 92 U/L — ABNORMAL HIGH (ref 11–51)

## 2023-10-01 LAB — ACETAMINOPHEN LEVEL: Acetaminophen (Tylenol), Serum: <10 ug/mL — ABNORMAL LOW (ref 10–30)

## 2023-10-01 LAB — ETHANOL: Alcohol, Ethyl (B): <10 mg/dL (ref <10)

## 2023-10-01 LAB — MRSA NEXT GEN BY PCR, NASAL: MRSA by PCR Next Gen: NOT DETECTED

## 2023-10-01 LAB — BETA-HYDROXYBUTYRIC ACID: Beta-Hydroxybutyric Acid: >8 mmol/L — ABNORMAL HIGH (ref 0.05–0.27)

## 2023-10-01 MED ORDER — ALBUTEROL SULFATE (2.5 MG/3ML) 0.083% IN NEBU: 2.5000 mg | INHALATION_SOLUTION | RESPIRATORY_TRACT | Status: DC | PRN

## 2023-10-01 MED ORDER — DEXTROSE 50 % IV SOLN: 0.0000 mL | INTRAVENOUS | Status: DC | PRN

## 2023-10-01 MED ORDER — LACTATED RINGERS IV SOLN: INTRAVENOUS | Status: AC

## 2023-10-01 MED ORDER — LACTATED RINGERS IV BOLUS
20.0000 mL/kg | Freq: Once | INTRAVENOUS | Status: AC
Start: 2023-10-01 — End: 2023-10-01
  Administered 2023-10-01: 1125 mL via INTRAVENOUS

## 2023-10-01 MED ORDER — HEPARIN BOLUS VIA INFUSION
2000.0000 [IU] | Freq: Once | INTRAVENOUS | Status: AC
  Administered 2023-10-01: 2000 [IU] via INTRAVENOUS
  Filled 2023-10-01: qty 2000

## 2023-10-01 MED ORDER — DEXTROSE IN LACTATED RINGERS 5 % IV SOLN: INTRAVENOUS | Status: AC

## 2023-10-01 MED ORDER — PANTOPRAZOLE SODIUM 40 MG PO TBEC
40.0000 mg | DELAYED_RELEASE_TABLET | Freq: Every day | ORAL | Status: DC
  Administered 2023-10-02: 40 mg via ORAL
  Filled 2023-10-01: qty 1

## 2023-10-01 MED ORDER — INSULIN ASPART 100 UNIT/ML IJ SOLN
10.0000 [IU] | Freq: Once | INTRAMUSCULAR | Status: AC
  Administered 2023-10-01: 10 [IU] via SUBCUTANEOUS

## 2023-10-01 MED ORDER — ACETAMINOPHEN 325 MG PO TABS
650.0000 mg | ORAL_TABLET | ORAL | Status: DC | PRN
  Administered 2023-10-02: 650 mg via ORAL
  Filled 2023-10-01: qty 2

## 2023-10-01 MED ORDER — SODIUM BICARBONATE 8.4 % IV SOLN
50.0000 meq | Freq: Once | INTRAVENOUS | Status: AC
  Administered 2023-10-01: 50 meq via INTRAVENOUS
  Filled 2023-10-01: qty 50

## 2023-10-01 MED ORDER — DOCUSATE SODIUM 100 MG PO CAPS: 100.0000 mg | ORAL_CAPSULE | Freq: Two times a day (BID) | ORAL | Status: DC | PRN

## 2023-10-01 MED ORDER — ONDANSETRON HCL 4 MG/2ML IJ SOLN
4.0000 mg | Freq: Once | INTRAMUSCULAR | Status: AC
  Administered 2023-10-01: 4 mg via INTRAVENOUS
  Filled 2023-10-01: qty 2

## 2023-10-01 MED ORDER — HEPARIN (PORCINE) 25000 UT/250ML-% IV SOLN
1000.0000 [IU]/h | INTRAVENOUS | Status: AC
  Administered 2023-10-01: 800 [IU]/h via INTRAVENOUS
  Filled 2023-10-01: qty 250

## 2023-10-01 MED ORDER — ONDANSETRON HCL 4 MG/2ML IJ SOLN: 4.0000 mg | Freq: Four times a day (QID) | INTRAMUSCULAR | Status: DC | PRN

## 2023-10-01 MED ORDER — INSULIN REGULAR(HUMAN) IN NACL 100-0.9 UT/100ML-% IV SOLN
INTRAVENOUS | Status: DC
  Administered 2023-10-01: 5.5 [IU]/h via INTRAVENOUS
  Filled 2023-10-01: qty 100

## 2023-10-01 MED ORDER — POLYETHYLENE GLYCOL 3350 17 G PO PACK: 17.0000 g | PACK | Freq: Every day | ORAL | Status: DC | PRN

## 2023-10-01 MED ORDER — HEPARIN SODIUM (PORCINE) 5000 UNIT/ML IJ SOLN: 5000.0000 [IU] | Freq: Three times a day (TID) | INTRAMUSCULAR | Status: DC

## 2023-10-01 NOTE — ED Notes (Signed)
Called [facility] for transport at 5:49.

## 2023-10-01 NOTE — ED Notes (Signed)
2 successful IV attempts

## 2023-10-01 NOTE — H&P (Cosign Needed Addendum)
NAME:  Tom Anderson, MRN:  [id number], DOB:  Oct 02, 1971, LOS: 0 ADMISSION DATE:  10/01/2023, CONSULTATION DATE:  11/1 REFERRING MD:  Dr. Marsh Dolly, CHIEF COMPLAINT:  DKA   History of Present Illness:  Patient is a 52 yo M w/ pertinent PMH DMT1, schizophrenia, depression, aortic thrombus (intermittently taking xarelto) presents to [facility] ED on 11/1 w/ DKA.  Patient with frequent admissions for DKA. Most recently discharged on 10/29. Last admission also with possible trazodone overdose. Patient states he has not been taking his insulin since discharge. Having diarrhea, N/V over the past 2 days. Came to [facility] ED on 11/1. On arrival afebrile and wbc 9.3. tachy 110s and bp mildly hypertensive. Patient w/ some confusion. Glucose >1,200, beta H >8. UA w/ ketones. Vbg 7.0, 18, 42, 4. Given IV fluids and started on insulin drip for DKA. CXR no significant findings. Flu/covid negative. Ethanol, salicylate/acetaminophen level wnl. UDS positive for thc. PCCM asked to admit and patient transferred to [facility].   Pertinent ED labs: na 125, k 5.9, creat 1.72  Pertinent  Medical History   Past Medical History:  Diagnosis Date   Asthma    Depression    Diabetes mellitus without complication (HCC)    Schizophrenia (HCC)    Uses self-applied continuous glucose monitoring device      Significant Hospital Events: Including procedures, antibiotic start and stop dates in addition to other pertinent events   11/1 admitted to [facility] w/ dka  Interim History / Subjective:  See above  Objective   Blood pressure 122/65, pulse (!) 121, temperature (!) 97.5 F (36.4 C), temperature source Axillary, resp. rate (!) 34, height 5\' 10"  (1.778 m), weight 52.8 kg, SpO2 100%.       No intake or output data in the 24 hours ending 10/01/23 1914 Filed Weights   10/01/23 1604 10/01/23 1658  Weight: 56.2 kg 52.8 kg    Examination: General: NAD HEENT: MM pink/dry Neuro: sleepy but arousable and AO; MAE CV: s1s2,  RRR, no m/r/g PULM:  dim clear BS bilaterally GI: soft, bsx4 active  Extremities: warm/dry, no edema  Skin: no rashes or lesions    Resolved Hospital Problem list     Assessment & Plan:  DKA DMT1 Plan: -Insulin per endotool -CBG monitoring -Aggressive IV fluid resuscitation -trend B-Hydroxy -serial bmp  Hyponatremia: likely pseudohyponatremia AKI Hyperkalemia Plan: -iv fluids -insulin drip -serial bmp -Trend urinary output -Replace electrolytes as indicated -Avoid nephrotoxic agents, ensure adequate renal perfusion  Hx of non occlusive aortic thrombus -not always compliant with home xarelto Plan: -heparin while npo and resume xarelto when more awake to take po -needs to f/u w/ vascular surgery outpt  Depression Schizophrenia Substance abuse: uds positive for THC -possible trazodone overdose on last admission 10/29 Plan: -not currently on any home meds; used to be on aripiprazole, trazodone -cessation counseling when appropriate -needs to f/u w/ psych    Best Practice (right click and "Reselect all SmartList Selections" daily)   Diet/type: NPO w/ oral meds DVT prophylaxis: systemic heparin GI prophylaxis: PPI Lines: N/A Foley:  N/A Code Status:  full code Last date of multidisciplinary goals of care discussion [11/1 updated Father Fayrene Fearing over phone]  Labs   CBC: Recent Labs  Lab 09/27/23 2123 09/27/23 2129 09/28/23 1013 10/01/23 1612 10/01/23 1658  WBC 16.0*  --  17.8* 9.3  --   NEUTROABS 12.2*  --   --   --   --   HGB 12.8* 15.6 10.8* 13.9 15.6  HCT 42.2 46.0 30.9* 43.4 46.0  MCV 108.5*  --  93.1 102.4*  --   PLT 399  --  287 324  --     Basic Metabolic Panel: Recent Labs  Lab 09/27/23 2249 09/28/23 0229 09/28/23 0826 09/28/23 1256 10/01/23 1612 10/01/23 1658  NA 120* 133* 134* 133* 125* 122*  K 7.3* 4.5 4.1 3.5 5.9* 6.1*  CL 84* 97* 98 100 86*  --   CO2 <7* 9* 22 24 <7*  --   GLUCOSE >1,200* 674* 225* 128* >1,200*  --   BUN 30*  30* 22* 19 28*  --   CREATININE 1.94* 2.10* 1.23 0.94 1.72*  --   CALCIUM 8.6* 9.3 9.3 8.7* 8.9  --   MG  --   --  1.8  --   --   --   PHOS  --   --  2.7  --   --   --    GFR: Estimated Creatinine Clearance: 38.4 mL/min (A) (by C-G formula based on SCr of 1.72 mg/dL (H)). Recent Labs  Lab 09/27/23 2123 09/27/23 2323 09/28/23 0229 09/28/23 0826 09/28/23 1013 10/01/23 1612  WBC 16.0*  --   --   --  17.8* 9.3  LATICACIDVEN 7.6* 7.1* 6.9* 2.7*  --   --     Liver Function Tests: Recent Labs  Lab 09/27/23 2123  AST 51*  ALT 106*  ALKPHOS 213*  BILITOT 1.4*  PROT 7.3  ALBUMIN 4.7   No results for input(s): "LIPASE", "AMYLASE" in the last 168 hours. No results for input(s): "AMMONIA" in the last 168 hours.  ABG    Component Value Date/Time   PHART 7.485 (H) 10/04/2020 0210   PCO2ART 25.5 (L) 10/04/2020 0210   PO2ART 152 (H) 10/04/2020 0210   HCO3 4.7 (L) 10/01/2023 1658   TCO2 5 (L) 10/01/2023 1658   ACIDBASEDEF 25.0 (H) 10/01/2023 1658   O2SAT 59 10/01/2023 1658     Coagulation Profile: No results for input(s): "INR", "PROTIME" in the last 168 hours.  Cardiac Enzymes: No results for input(s): "CKTOTAL", "CKMB", "CKMBINDEX", "TROPONINI" in the last 168 hours.  HbA1C: Hgb A1c MFr Bld  Date/Time Value Ref Range Status  08/27/2023 06:26 AM 12.2 (H) 4.8 - 5.6 % Final    Comment:    (NOTE) Pre diabetes:          5.7%-6.4%  Diabetes:              >6.4%  Glycemic control for   <7.0% adults with diabetes   03/22/2023 02:05 AM 14.2 (H) 4.8 - 5.6 % Final    Comment:    (NOTE) Pre diabetes:          5.7%-6.4%  Diabetes:              >6.4%  Glycemic control for   <7.0% adults with diabetes     CBG: Recent Labs  Lab 09/28/23 1233 09/28/23 1333 09/28/23 1530 10/01/23 1604 10/01/23 1807  GLUCAP 151* 134* 216* >600* >600*    Review of Systems:   Review of Systems  Constitutional:  Negative for fever.  Respiratory:  Negative for shortness of breath.    Cardiovascular:  Negative for chest pain.  Gastrointestinal:  Positive for abdominal pain, diarrhea, nausea and vomiting. Negative for constipation.     Past Medical History:  He,  has a past medical history of Asthma, Depression, Diabetes mellitus without complication (HCC), Schizophrenia (HCC), and Uses self-applied continuous glucose monitoring device.  Surgical History:   Past Surgical History:  Procedure Laterality Date   HERNIA REPAIR       Social History:   reports that he has been smoking cigarettes. He has never used smokeless tobacco. He reports current alcohol use. He reports current drug use. Drug: Marijuana.   Family History:  His family history includes Diabetes in his maternal grandfather.   Allergies Allergies  Allergen Reactions   Nitrous Oxide Nausea And Vomiting   Latex Rash     Home Medications  Prior to Admission medications   Medication Sig Start Date End Date Taking? Authorizing Provider  acetaminophen (TYLENOL) 325 MG tablet Take 2 tablets (650 mg total) by mouth every 6 (six) hours as needed for mild pain (or Fever >/= 101). 05/22/23   Meredeth Ide, MD  Blood Glucose Monitoring Suppl DEVI 1 each by Does not apply route 3 (three) times daily. May dispense any manufacturer covered by patient's insurance. 08/27/23   Rai, Delene Ruffini, MD  Continuous Glucose Sensor (DEXCOM G6 SENSOR) MISC Inject 1 Device into the skin See admin instructions. Place 1 new sensor into the skin every 10 days Patient not taking: Reported on 09/04/2023 05/15/23   [provider]  folic acid (FOLVITE) 1 MG tablet Take 1 tablet (1 mg total) by mouth daily. 03/23/23   Rai, Ripudeep K, MD  Glucose Blood (BLOOD GLUCOSE TEST STRIPS) STRP 1 each by Does not apply route 3 (three) times daily. Use as directed to check blood sugar. May dispense any manufacturer covered by patient's insurance and fits patient's device. 08/27/23   Rai, Ripudeep K, MD  insulin aspart (NOVOLOG) 100 UNIT/ML  FlexPen Inject 3 Units into the skin 3 (three) times daily with meals. Do not take if you do not eat a meal. 09/28/23   Elberta Fortis, MD  insulin detemir (LEVEMIR) 100 UNIT/ML injection Inject 0.16 mLs (16 Units total) into the skin 2 (two) times daily. 09/28/23   Elberta Fortis, MD  Insulin Pen Needle (PEN NEEDLES) 31G X 5 MM MISC 1 each by Does not apply route 3 (three) times daily. May dispense any manufacturer covered by patient's insurance. 09/05/23   Danford, Earl Lites, MD  Lancet Device MISC 1 each by Does not apply route 3 (three) times daily. May dispense any manufacturer covered by patient's insurance. 08/27/23   Rai, Delene Ruffini, MD  Lancets MISC 1 each by Does not apply route 3 (three) times daily. Use as directed to check blood sugar. May dispense any manufacturer covered by patient's insurance and fits patient's device. 08/27/23   Rai, Ripudeep K, MD  ondansetron (ZOFRAN) 4 MG tablet Take 1 tablet (4 mg total) by mouth every 6 (six) hours as needed for nausea. 08/27/23   Rai, Ripudeep K, MD  pantoprazole (PROTONIX) 40 MG tablet Take 1 tablet (40 mg total) by mouth daily. 08/27/23   Rai, Delene Ruffini, MD  rivaroxaban (XARELTO) 20 MG TABS tablet Take 1 tablet (20 mg total) by mouth daily with supper. 09/28/23   Elberta Fortis, MD  VENTOLIN HFA 108 [phone] Base) MCG/ACT inhaler Inhale 2 puffs into the lungs every 4 (four) hours as needed for wheezing or shortness of breath. 05/22/23   Meredeth Ide, MD     Critical care time: 45 minutes     JD Anselm Lis [place] [facility] 10/01/2023, 7:14 PM  Please see [email or web address] for pager details.  From 7A-7P if no response, please call [phone]. After hours, please call ELink [phone].

## 2023-10-01 NOTE — Progress Notes (Signed)
PHARMACY - ANTICOAGULATION CONSULT NOTE  Pharmacy Consult for heparin Indication: aortic thrombus  Allergies  Allergen Reactions   Nitrous Oxide Nausea And Vomiting   Latex Rash    Patient Measurements: Height: 5\' 10"  (177.8 cm) Weight: 52.8 kg (116 lb 6.5 oz) IBW/kg (Calculated) : 73 Heparin Dosing Weight: 53 kg  Vital Signs: Temp: 97.5 F (36.4 C) (11/01 1833) Temp Source: Axillary (11/01 1931) BP: 162/117 (11/01 1931) Pulse Rate: 121 (11/01 1833)  Labs: Recent Labs    10/01/23 1612 10/01/23 1658  HGB 13.9 15.6  HCT 43.4 46.0  PLT 324  --   CREATININE 1.72*  --     Estimated Creatinine Clearance: 38.4 mL/min (A) (by C-G formula based on SCr of 1.72 mg/dL (H)).   Medical History: Past Medical History:  Diagnosis Date   Asthma    Depression    Diabetes mellitus without complication (HCC)    Schizophrenia (HCC)    Uses self-applied continuous glucose monitoring device      Assessment: 52 yo M with aortic thrombus seen on TTE 03/20/23 and supposed to be on rivaroxaban PTA but patient admits to noncompliance and cannot remember the last time he took it. Last fill history found was 06/12/23 for a 30 day supply. Pharmacy consulted for heparin.     Goal of Therapy:  Heparin level 0.3-0.7 units/ml Monitor platelets by anticoagulation protocol: Yes   Plan:  Heparin 2000 unit bolus then 800 units/hr Monitor daily heparin level, CBC, signs/symptoms of bleeding  F/u long term anticoagulation plan  Alphia Moh, PharmD, BCPS, BCCP Clinical Pharmacist  Please check AMION for all [facility] Pharmacy phone numbers After 10:00 PM, call Main Pharmacy [phone]

## 2023-10-01 NOTE — ED Provider Notes (Signed)
[facility] Provider Note   CSN: [id number] Arrival date & time: 10/01/23  1554     History T1DM with multiple episodes of DKA  Aortic thrombus (intermittent xarelto use)  Polysubstance use (cocaine, trazodone, THC)  Schizophrenia with suicidality Tardive dyskinesia   Patient recently discharged from [facility] on 09/28/2023 for DKA in the setting of medication non adherence and trazodone overdose denied suicidal ideation at that time.   Chief Complaint  Patient presents with   Hyperglycemia    Tom Anderson is a 52 y.o. male.  Patient is altered thus history was provided by patient and supplemented heavily by patient's mother and father whom he lives with. Parents report that patient has had nausea and vomiting for the last 2 days.  He was recently discharged from the ICU on the 29th.  After being there for DKA.  They did not know why patient has been having multiple episodes of DKA.  They or his decision makers if patient is not able to make decisions.  They deny any report of fever or URI symptoms last couple days or pain anywhere.  They have not noticed him using any other substances other than THC.  They do note that patient had access to trazodone.  They did not realize that patient had been treated for trazodone overdose during last admission as well.  They mention that patient is also been making suicidal type statements and is just seemed more depressed in general.  Patient has schizophrenia and according to parents goes to a psychiatric treatment center once a month but they also report that patient does not always stay consistent with his medications.  They always bring his medicines for him medicines last a lot longer than they should.  Also reports that patient has not been taking his Xarelto for his aortic thrombus.  They say that this morning patient was able to walk and was a little bit altered but was not as altered as he is here  on exam and was not as short of breath.   Patient reports that last time he took insulin, Xarelto, and trazodone was last night.  Says that he hurts all over on that he has been vomiting.  Denies taking more pain medication than he was post to or any other substances.  The history is provided by the patient and a parent. The history is limited by the condition of the patient.  Hyperglycemia Blood sugar level PTA:  > 1200 Severity:  Severe Onset quality:  Gradual Duration:  2 days Chronicity:  Recurrent Diabetes status:  Controlled with insulin Current diabetic therapy:  BID levemir 16 units, novolog 3 units with meals Time since last antidiabetic medication:  12 hours Associated symptoms: abdominal pain, altered mental status, confusion, dehydration, fatigue, increased thirst, malaise, polyuria, shortness of breath and vomiting   Risk factors: hx of DKA        Home Medications Prior to Admission medications   Medication Sig Start Date End Date Taking? Authorizing Provider  acetaminophen (TYLENOL) 325 MG tablet Take 2 tablets (650 mg total) by mouth every 6 (six) hours as needed for mild pain (or Fever >/= 101). 05/22/23   Meredeth Ide, MD  Blood Glucose Monitoring Suppl DEVI 1 each by Does not apply route 3 (three) times daily. May dispense any manufacturer covered by patient's insurance. 08/27/23   Rai, Delene Ruffini, MD  Continuous Glucose Sensor (DEXCOM G6 SENSOR) MISC Inject 1 Device into the skin See  admin instructions. Place 1 new sensor into the skin every 10 days Patient not taking: Reported on 09/04/2023 05/15/23   [provider]  folic acid (FOLVITE) 1 MG tablet Take 1 tablet (1 mg total) by mouth daily. 03/23/23   Rai, Ripudeep K, MD  Glucose Blood (BLOOD GLUCOSE TEST STRIPS) STRP 1 each by Does not apply route 3 (three) times daily. Use as directed to check blood sugar. May dispense any manufacturer covered by patient's insurance and fits patient's device. 08/27/23   Rai,  Ripudeep K, MD  insulin aspart (NOVOLOG) 100 UNIT/ML FlexPen Inject 3 Units into the skin 3 (three) times daily with meals. Do not take if you do not eat a meal. 09/28/23   Elberta Fortis, MD  insulin detemir (LEVEMIR) 100 UNIT/ML injection Inject 0.16 mLs (16 Units total) into the skin 2 (two) times daily. 09/28/23   Elberta Fortis, MD  Insulin Pen Needle (PEN NEEDLES) 31G X 5 MM MISC 1 each by Does not apply route 3 (three) times daily. May dispense any manufacturer covered by patient's insurance. 09/05/23   Danford, Earl Lites, MD  Lancet Device MISC 1 each by Does not apply route 3 (three) times daily. May dispense any manufacturer covered by patient's insurance. 08/27/23   Rai, Delene Ruffini, MD  Lancets MISC 1 each by Does not apply route 3 (three) times daily. Use as directed to check blood sugar. May dispense any manufacturer covered by patient's insurance and fits patient's device. 08/27/23   Rai, Ripudeep K, MD  ondansetron (ZOFRAN) 4 MG tablet Take 1 tablet (4 mg total) by mouth every 6 (six) hours as needed for nausea. 08/27/23   Rai, Ripudeep K, MD  pantoprazole (PROTONIX) 40 MG tablet Take 1 tablet (40 mg total) by mouth daily. 08/27/23   Rai, Delene Ruffini, MD  rivaroxaban (XARELTO) 20 MG TABS tablet Take 1 tablet (20 mg total) by mouth daily with supper. 09/28/23   Elberta Fortis, MD  VENTOLIN HFA 108 [phone] Base) MCG/ACT inhaler Inhale 2 puffs into the lungs every 4 (four) hours as needed for wheezing or shortness of breath. 05/22/23   Meredeth Ide, MD      Allergies    Nitrous oxide and Latex    Review of Systems   Review of Systems  Constitutional:  Positive for fatigue.  Respiratory:  Positive for shortness of breath.   Gastrointestinal:  Positive for abdominal pain and vomiting.  Endocrine: Positive for polydipsia and polyuria.  Psychiatric/Behavioral:  Positive for confusion.     Physical Exam Updated Vital Signs BP (!) 133/95 (BP Location: Left Arm)   Pulse (!) 124    Temp (!) 96.4 F (35.8 C)   Resp (!) 28   Ht 5\' 10"  (1.778 m)   Wt 56.2 kg   SpO2 100%   BMI 17.78 kg/m  Physical Exam Constitutional:      Appearance: He is ill-appearing.  HENT:     Head: Atraumatic.     Mouth/Throat:     Mouth: Mucous membranes are dry.  Eyes:     Extraocular Movements: Extraocular movements intact.     Conjunctiva/sclera: Conjunctivae normal.     Pupils: Pupils are equal, round, and reactive to light.     Comments: Pupils are 2 mm bilatearlly   Cardiovascular:     Rate and Rhythm: Regular rhythm. Tachycardia present.     Pulses: Normal pulses.     Heart sounds: Normal heart sounds.  Pulmonary:     Effort: Respiratory  distress present.     Comments: Kussmaul breathing  Abdominal:     General: Abdomen is flat.     Palpations: Abdomen is soft.     Tenderness: There is abdominal tenderness (diffuse tenderness with grimacing). There is no guarding or rebound.  Musculoskeletal:     Right lower leg: No edema.     Left lower leg: No edema.  Skin:    General: Skin is dry.     Capillary Refill: Capillary refill takes more than 3 seconds.     Comments: Slightly cool in hands and feet   Neurological:     Mental Status: He is disoriented.     Cranial Nerves: No cranial nerve deficit.     Motor: No weakness.     Comments: Difficulty staying awake requiring raised voice and shaking to awaken patient, and falls back asleep. Oriented to self and place      ED Results / Procedures / Treatments   Labs (all labs ordered are listed, but only abnormal results are displayed) Labs Reviewed  CBC - Abnormal; Notable for the following components:      Result Value   MCV 102.4 (*)    All other components within normal limits  CBG MONITORING, ED - Abnormal; Notable for the following components:   Glucose-Capillary >600 (*)    All other components within normal limits  RESP PANEL BY RT-PCR (RSV, FLU A&B, COVID)  RVPGX2  BASIC METABOLIC PANEL  URINALYSIS, ROUTINE W  REFLEX MICROSCOPIC  BETA-HYDROXYBUTYRIC ACID  BETA-HYDROXYBUTYRIC ACID  HEPATIC FUNCTION PANEL  LIPASE, BLOOD  RAPID URINE DRUG SCREEN, HOSP PERFORMED  CBG MONITORING, ED  I-STAT VENOUS BLOOD GAS, ED    EKG None  Radiology No results found.  Procedures Procedures    Medications Ordered in ED Medications  lactated ringers bolus 1,124 mL (has no administration in time range)  insulin aspart (novoLOG) injection 10 Units (has no administration in time range)    ED Course/ Medical Decision Making/ A&P                                 Medical Decision Making This patient presents to the ED for concern of abdominal pain and vomiting, this involves an extensive number of treatment options, and is a complaint that carries with it a high risk of complications and morbidity.  The differential diagnosis includes gastroenteritis, DKA, obstruction, substance use intoxication or overdose.    Co morbidities that complicate the patient evaluation       T1DM with previous history of DKA  - Schizophrenia with previous SI    My initial workup includes  Additional history obtained from: Nursing notes from this visit. Prior ED visit on 9/26, 10/5, 10/28  Previous records within EMR system  Family mother and father whom patient lives with   I ordered, reviewed and interpreted labs which include: BMP, BHB, CBC, VBG, POC glucose   Cardiac Monitoring:       The patient was maintained on a cardiac monitor.  I personally viewed and interpreted the cardiac monitored which showed an underlying rhythm of: tachycardia in sinus rhythm   Consultations Obtained:  I requested consultation with the CCM,  and discussed lab and imaging findings as well as pertinent plan - they recommend: admission to ICU given altered mental status and pH of 7.   Final decision Will admit patient to ICU given severe DKA. Have already started endotool and  given IV bolus to treat dehydration, AKI and acidosis.   Still awaiting labs for substance use. EKG does not indicate toxic presentation in terms of substance use.   Note: Portions of this report may have been transcribed using voice recognition software. Every effort was made to ensure accuracy; however, inadvertent computerized transcription errors may still be present.   Amount and/or Complexity of Data Reviewed Labs: ordered. Radiology: ordered.  Risk Prescription drug management. Decision regarding hospitalization.  Final Clinical Impression(s) / ED Diagnoses Final diagnoses:  DKA in type 1 diabetes mellitus      Lockie Mola, MD 10/02/23 0011    Charlynne Pander, MD 10/02/23 2257

## 2023-10-01 NOTE — ED Triage Notes (Signed)
Pt concern for DKA,   Recent hospitalization after ectasy use  States body aches. N/V that started 2 days ago   CBG >600 , reading HI

## 2023-10-01 NOTE — Progress Notes (Signed)
eLink Physician-Brief Progress Note Patient Name: Tom Anderson DOB: May 07, 1971 MRN: [id number]   Date of Service  10/01/2023  HPI/Events of Note  DKA  eICU Interventions  New pt   DKA Hemodynamically stable  Insulin drip IVF Serial labs   Intervention Category Major Interventions: Acid-Base disturbance - evaluation and management  Massie Maroon 10/01/2023, 8:04 PM

## 2023-10-02 DIAGNOSIS — E1010 Type 1 diabetes mellitus with ketoacidosis without coma: Secondary | ICD-10-CM | POA: Diagnosis not present

## 2023-10-02 LAB — BASIC METABOLIC PANEL
Anion gap: 18 — ABNORMAL HIGH (ref 5–15)
Anion gap: 13 (ref 5–15)
Anion gap: 15 (ref 5–15)
BUN: 23 mg/dL — ABNORMAL HIGH (ref 6–20)
BUN: 21 mg/dL — ABNORMAL HIGH (ref 6–20)
BUN: 19 mg/dL (ref 6–20)
CO2: 18 mmol/L — ABNORMAL LOW (ref 22–32)
CO2: 22 mmol/L (ref 22–32)
CO2: 24 mmol/L (ref 22–32)
Calcium: 9.4 mg/dL (ref 8.9–10.3)
Calcium: 9.2 mg/dL (ref 8.9–10.3)
Calcium: 9.1 mg/dL (ref 8.9–10.3)
Chloride: 102 mmol/L (ref 98–111)
Chloride: 102 mmol/L (ref 98–111)
Chloride: 96 mmol/L — ABNORMAL LOW (ref 98–111)
Creatinine, Ser: 1.42 mg/dL — ABNORMAL HIGH (ref 0.61–1.24)
Creatinine, Ser: 1.11 mg/dL (ref 0.61–1.24)
Creatinine, Ser: 1 mg/dL (ref 0.61–1.24)
GFR, Estimated: >60 mL/min (ref >60)
GFR, Estimated: >60 mL/min (ref >60)
GFR, Estimated: >60 mL/min (ref >60)
Glucose, Bld: 244 mg/dL — ABNORMAL HIGH (ref 70–99)
Glucose, Bld: 184 mg/dL — ABNORMAL HIGH (ref 70–99)
Glucose, Bld: 118 mg/dL — ABNORMAL HIGH (ref 70–99)
Potassium: 4.4 mmol/L (ref 3.5–5.1)
Potassium: 4 mmol/L (ref 3.5–5.1)
Potassium: 3.5 mmol/L (ref 3.5–5.1)
Sodium: 138 mmol/L (ref 135–145)
Sodium: 137 mmol/L (ref 135–145)
Sodium: 135 mmol/L (ref 135–145)

## 2023-10-02 LAB — GLUCOSE, CAPILLARY
Glucose-Capillary: 120 mg/dL — ABNORMAL HIGH (ref 70–99)
Glucose-Capillary: 133 mg/dL — ABNORMAL HIGH (ref 70–99)
Glucose-Capillary: 137 mg/dL — ABNORMAL HIGH (ref 70–99)
Glucose-Capillary: 138 mg/dL — ABNORMAL HIGH (ref 70–99)
Glucose-Capillary: 146 mg/dL — ABNORMAL HIGH (ref 70–99)
Glucose-Capillary: 157 mg/dL — ABNORMAL HIGH (ref 70–99)
Glucose-Capillary: 173 mg/dL — ABNORMAL HIGH (ref 70–99)
Glucose-Capillary: 174 mg/dL — ABNORMAL HIGH (ref 70–99)
Glucose-Capillary: 176 mg/dL — ABNORMAL HIGH (ref 70–99)
Glucose-Capillary: 194 mg/dL — ABNORMAL HIGH (ref 70–99)
Glucose-Capillary: 207 mg/dL — ABNORMAL HIGH (ref 70–99)
Glucose-Capillary: 215 mg/dL — ABNORMAL HIGH (ref 70–99)
Glucose-Capillary: 229 mg/dL — ABNORMAL HIGH (ref 70–99)
Glucose-Capillary: 60 mg/dL — ABNORMAL LOW (ref 70–99)
Glucose-Capillary: 87 mg/dL (ref 70–99)

## 2023-10-02 LAB — CBC
HCT: 36.1 % — ABNORMAL LOW (ref 39.0–52.0)
Hemoglobin: 12.7 g/dL — ABNORMAL LOW (ref 13.0–17.0)
MCH: 33.2 pg (ref 26.0–34.0)
MCHC: 35.2 g/dL (ref 30.0–36.0)
MCV: 94.3 fL (ref 80.0–100.0)
Platelets: 257 10*3/uL (ref 150–400)
RBC: 3.83 MIL/uL — ABNORMAL LOW (ref 4.22–5.81)
RDW: 12.3 % (ref 11.5–15.5)
WBC: 14.8 10*3/uL — ABNORMAL HIGH (ref 4.0–10.5)
nRBC: 0 % (ref 0.0–0.2)

## 2023-10-02 LAB — HEPARIN LEVEL (UNFRACTIONATED)
Heparin Unfractionated: <0.1 [IU]/mL — ABNORMAL LOW (ref 0.30–0.70)
Heparin Unfractionated: 0.34 [IU]/mL (ref 0.30–0.70)

## 2023-10-02 LAB — BETA-HYDROXYBUTYRIC ACID
Beta-Hydroxybutyric Acid: 5.16 mmol/L — ABNORMAL HIGH (ref 0.05–0.27)
Beta-Hydroxybutyric Acid: 1.68 mmol/L — ABNORMAL HIGH (ref 0.05–0.27)

## 2023-10-02 MED ORDER — RIVAROXABAN 10 MG PO TABS
20.0000 mg | ORAL_TABLET | Freq: Every day | ORAL | Status: DC
  Administered 2023-10-02: 20 mg via ORAL
  Filled 2023-10-02: qty 2
  Filled 2023-10-02: qty 1

## 2023-10-02 MED ORDER — MELATONIN 5 MG PO TABS
5.0000 mg | ORAL_TABLET | Freq: Every evening | ORAL | Status: DC | PRN
  Administered 2023-10-03: 5 mg via ORAL
  Filled 2023-10-02: qty 1

## 2023-10-02 MED ORDER — INSULIN DETEMIR 100 UNIT/ML ~~LOC~~ SOLN
0.3000 [IU]/kg | Freq: Every day | SUBCUTANEOUS | Status: DC
  Administered 2023-10-02 – 2023-10-03 (×2): 16 [IU] via SUBCUTANEOUS
  Filled 2023-10-02 (×2): qty 0.16

## 2023-10-02 MED ORDER — INSULIN ASPART 100 UNIT/ML IJ SOLN
0.0000 [IU] | Freq: Three times a day (TID) | INTRAMUSCULAR | Status: DC
  Administered 2023-10-02: 2 [IU] via SUBCUTANEOUS
  Administered 2023-10-03: 9 [IU] via SUBCUTANEOUS

## 2023-10-02 NOTE — Progress Notes (Addendum)
PHARMACY - ANTICOAGULATION CONSULT NOTE  Pharmacy Consult for heparin Indication: aortic thrombus  Allergies  Allergen Reactions   Latex Rash   Nitrous Oxide Nausea And Vomiting    Patient Measurements: Height: 5\' 10"  (177.8 cm) Weight: 51.7 kg (113 lb 15.7 oz) IBW/kg (Calculated) : 73 Heparin Dosing Weight: 53 kg  Vital Signs: Temp: 97.8 F (36.6 C) (11/02 0707) Temp Source: Oral (11/02 0707) BP: 119/80 (11/02 0800) Pulse Rate: 100 (11/02 0841)  Labs: Recent Labs    10/01/23 1612 10/01/23 1658 10/01/23 2046 10/02/23 0030 10/02/23 0359  HGB 13.9 15.6  --   --   --   HCT 43.4 46.0  --   --   --   PLT 324  --   --   --   --   HEPARINUNFRC  --   --   --   --  <0.10*  CREATININE 1.72*  --  1.79* 1.42* 1.11    Estimated Creatinine Clearance: 58.2 mL/min (by C-G formula based on SCr of 1.11 mg/dL).   Medical History: Past Medical History:  Diagnosis Date   Asthma    Depression    Diabetes mellitus without complication (HCC)    Schizophrenia (HCC)    Uses self-applied continuous glucose monitoring device      Assessment: 52 yo M with aortic thrombus seen on TTE 03/20/23 and supposed to be on rivaroxaban PTA but patient admits to noncompliance and cannot remember the last time he took it. Last fill history found was 06/12/23 for a 30 day supply. Pharmacy consulted for Xarelto transition from heparin.     Goal of Therapy:  Heparin level 0.3-0.7 units/ml Monitor platelets by anticoagulation protocol: Yes   Plan:  Heparin 1000 u/h infusion Check HL at 1200 Monitor CBC, signs/symptoms of bleeding  Xarelto 20 mg at 1700, discontinue heparin at that time  Rutherford Nail, PharmD PGY2 Critical Care Pharmacy Resident Please check AMION for all [facility] Pharmacy phone numbers After 10:00 PM, call Main Pharmacy [phone]  1200 heparin level therapeutic at 0.34, continue current therapy until transition at 1700

## 2023-10-02 NOTE — Plan of Care (Signed)
  Problem: Education: Goal: Knowledge of General Education information will improve Description: Including pain rating scale, medication(s)/side effects and non-pharmacologic comfort measures Outcome: Progressing   Problem: Clinical Measurements: Goal: Ability to maintain clinical measurements within normal limits will improve Outcome: Progressing   Problem: Clinical Measurements: Goal: Diagnostic test results will improve Outcome: Progressing   Problem: Clinical Measurements: Goal: Respiratory complications will improve Outcome: Progressing   

## 2023-10-02 NOTE — Progress Notes (Signed)
NAME:  Tom Anderson, MRN:  [id number], DOB:  February 02, 1971, LOS: 1 ADMISSION DATE:  10/01/2023, CONSULTATION DATE:  11/1 REFERRING MD:  Dr. Marsh Dolly, CHIEF COMPLAINT:  DKA   History of Present Illness:  Patient is a 52 yo M w/ pertinent PMH DMT1, schizophrenia, depression, aortic thrombus (intermittently taking xarelto) presents to [facility] ED on 11/1 w/ DKA.  Patient with frequent admissions for DKA. Most recently discharged on 10/29. Last admission also with possible trazodone overdose. Patient states he has not been taking his insulin since discharge. Having diarrhea, N/V over the past 2 days. Came to [facility] ED on 11/1. On arrival afebrile and wbc 9.3. tachy 110s and bp mildly hypertensive. Patient w/ some confusion. Glucose >1,200, beta H >8. UA w/ ketones. Vbg 7.0, 18, 42, 4. Given IV fluids and started on insulin drip for DKA. CXR no significant findings. Flu/covid negative. Ethanol, salicylate/acetaminophen level wnl. UDS positive for thc. PCCM asked to admit and patient transferred to [facility].   Pertinent ED labs: na 125, k 5.9, creat 1.72  Pertinent  Medical History   Past Medical History:  Diagnosis Date   Asthma    Depression    Diabetes mellitus without complication (HCC)    Schizophrenia (HCC)    Uses self-applied continuous glucose monitoring device     Significant Hospital Events: Including procedures, antibiotic start and stop dates in addition to other pertinent events   11/1 admitted to [facility] w/ dka  Interim History / Subjective:  No overnight events Improving with treatment  Objective   Blood pressure (!) 117/97, pulse 98, temperature 97.8 F (36.6 C), temperature source Oral, resp. rate (!) 21, height 5\' 10"  (1.778 m), weight 51.7 kg, SpO2 100%.        Intake/Output Summary (Last 24 hours) at 10/02/2023 0804 Last data filed at 10/02/2023 0700 Gross per 24 hour  Intake 1666.42 ml  Output 1050 ml  Net 616.42 ml   Filed Weights   10/01/23 1658 10/01/23 2000  10/02/23 0430  Weight: 52.8 kg 51.7 kg 51.7 kg    Examination: General: Middle-age, does not appear to be in distress HEENT: Dry oral mucosa Neuro: Alert and oriented CV: S1-S2 appreciated PULM: Clear breath sounds GI: Soft, bowel sounds appreciated Extremities: warm/dry, no edema  Skin: no rashes or lesions    Resolved Hospital Problem list     Assessment & Plan:   Type 1 diabetes DKA -Continue glucose monitoring -Anion gap is closed -Transition off Endo tool  Hyponatremia-resolved  Acute kidney injury -Avoid nephrotoxic medications -Maintain renal perfusion -Avoid nephrotoxic agents  History of nonocclusive aortic thrombus -On heparin at present -Will transition to Xarelto once taking orally  History of depression History of schizophrenia History of drug abuse -Cessation counseling     Best Practice (right click and "Reselect all SmartList Selections" daily)   Diet/type: Regular consistency (see orders) DVT prophylaxis: systemic heparin GI prophylaxis: PPI Lines: N/A Foley:  N/A Code Status:  full code Last date of multidisciplinary goals of care discussion [11/1 updated Father Fayrene Fearing over phone]  Labs   CBC: Recent Labs  Lab 09/27/23 2123 09/27/23 2129 09/28/23 1013 10/01/23 1612 10/01/23 1658  WBC 16.0*  --  17.8* 9.3  --   NEUTROABS 12.2*  --   --   --   --   HGB 12.8* 15.6 10.8* 13.9 15.6  HCT 42.2 46.0 30.9* 43.4 46.0  MCV 108.5*  --  93.1 102.4*  --   PLT 399  --  287 324  --     Basic Metabolic Panel: Recent Labs  Lab 09/28/23 0826 09/28/23 1256 10/01/23 1612 10/01/23 1658 10/01/23 2046 10/02/23 0030 10/02/23 0359  NA 134* 133* 125* 122* 133* 138 137  K 4.1 3.5 5.9* 6.1* 3.9 4.4 4.0  CL 98 100 86*  --  97* 102 102  CO2 22 24 <7*  --  11* 18* 22  GLUCOSE 225* 128* >1,200*  --  523* 244* 184*  BUN 22* 19 28*  --  26* 23* 21*  CREATININE 1.23 0.94 1.72*  --  1.79* 1.42* 1.11  CALCIUM 9.3 8.7* 8.9  --  9.5 9.4 9.2  MG 1.8   --   --   --   --   --   --   PHOS 2.7  --   --   --   --   --   --    GFR: Estimated Creatinine Clearance: 58.2 mL/min (by C-G formula based on SCr of 1.11 mg/dL). Recent Labs  Lab 09/27/23 2123 09/27/23 2323 09/28/23 0229 09/28/23 0826 09/28/23 1013 10/01/23 1612  WBC 16.0*  --   --   --  17.8* 9.3  LATICACIDVEN 7.6* 7.1* 6.9* 2.7*  --   --     Liver Function Tests: Recent Labs  Lab 09/27/23 2123 10/01/23 1612  AST 51* 82*  ALT 106* 127*  ALKPHOS 213* 251*  BILITOT 1.4* 1.4*  PROT 7.3 7.6  ALBUMIN 4.7 4.7   Recent Labs  Lab 10/01/23 1612  LIPASE 92*   No results for input(s): "AMMONIA" in the last 168 hours.  ABG    Component Value Date/Time   PHART 7.485 (H) 10/04/2020 0210   PCO2ART 25.5 (L) 10/04/2020 0210   PO2ART 152 (H) 10/04/2020 0210   HCO3 4.7 (L) 10/01/2023 1658   TCO2 5 (L) 10/01/2023 1658   ACIDBASEDEF 25.0 (H) 10/01/2023 1658   O2SAT 59 10/01/2023 1658     Coagulation Profile: No results for input(s): "INR", "PROTIME" in the last 168 hours.  Cardiac Enzymes: No results for input(s): "CKTOTAL", "CKMB", "CKMBINDEX", "TROPONINI" in the last 168 hours.  HbA1C: Hgb A1c MFr Bld  Date/Time Value Ref Range Status  08/27/2023 06:26 AM 12.2 (H) 4.8 - 5.6 % Final    Comment:    (NOTE) Pre diabetes:          5.7%-6.4%  Diabetes:              >6.4%  Glycemic control for   <7.0% adults with diabetes   03/22/2023 02:05 AM 14.2 (H) 4.8 - 5.6 % Final    Comment:    (NOTE) Pre diabetes:          5.7%-6.4%  Diabetes:              >6.4%  Glycemic control for   <7.0% adults with diabetes     CBG: Recent Labs  Lab 10/02/23 0309 10/02/23 0430 10/02/23 0535 10/02/23 0630 10/02/23 0731  GLUCAP 194* 157* 174* 173* 146*    Review of Systems:   Review of Systems  Constitutional:  Negative for fever.  Respiratory:  Negative for shortness of breath.   Cardiovascular:  Negative for chest pain.  Gastrointestinal:  Positive for abdominal  pain, diarrhea, nausea and vomiting. Negative for constipation.     Past Medical History:  He,  has a past medical history of Asthma, Depression, Diabetes mellitus without complication (HCC), Schizophrenia (HCC), and Uses self-applied continuous glucose monitoring device.  Surgical History:   Past Surgical History:  Procedure Laterality Date   HERNIA REPAIR       Social History:   reports that he has been smoking cigarettes. He has never used smokeless tobacco. He reports current alcohol use. He reports current drug use. Drug: Marijuana.   Family History:  His family history includes Diabetes in his maternal grandfather.   Allergies Allergies  Allergen Reactions   Latex Rash   Nitrous Oxide Nausea And Vomiting    The patient is critically ill with multiple organ systems failure and requires high complexity decision making for assessment and support, frequent evaluation and titration of therapies, application of advanced monitoring technologies and extensive interpretation of multiple databases. Critical Care Time devoted to patient care services described in this note independent of APP/resident time (if applicable)  is 33 minutes.   Virl Diamond MD [facility] Personal pager: See Amion If unanswered, please page CCM On-call: #[phone]

## 2023-10-02 NOTE — Progress Notes (Signed)
PHARMACY - ANTICOAGULATION Pharmacy Consult for heparin Indication: aortic thrombus Brief A/P: Heparin level subtherapeutic Increase Heparin rate  Allergies  Allergen Reactions   Latex Rash   Nitrous Oxide Nausea And Vomiting    Patient Measurements: Height: 5\' 10"  (177.8 cm) Weight: 51.7 kg (113 lb 15.7 oz) IBW/kg (Calculated) : 73 Heparin Dosing Weight: 53 kg  Vital Signs: Temp: 97.2 F (36.2 C) (11/02 0305) Temp Source: Axillary (11/02 0305) BP: 105/95 (11/02 0430) Pulse Rate: 88 (11/02 0430)  Labs: Recent Labs    10/01/23 1612 10/01/23 1658 10/01/23 2046 10/02/23 0030 10/02/23 0359  HGB 13.9 15.6  --   --   --   HCT 43.4 46.0  --   --   --   PLT 324  --   --   --   --   HEPARINUNFRC  --   --   --   --  <0.10*  CREATININE 1.72*  --  1.79* 1.42* 1.11    Estimated Creatinine Clearance: 58.2 mL/min (by C-G formula based on SCr of 1.11 mg/dL).  Assessment: 52 yo male with h/o aortic thrombus, Xarelto on hold,  for heparin    Goal of Therapy:  Heparin level 0.3-0.7 units/ml Monitor platelets by anticoagulation protocol: Yes   Plan:  Increase Heparin 1000 units/hr Check heparin level in 8 hours.   Geannie Risen, PharmD, BCPS

## 2023-10-02 NOTE — Inpatient Diabetes Management (Signed)
Inpatient Diabetes Program Recommendations  AACE/ADA: New Consensus Statement on Inpatient Glycemic Control (2015)  Target Ranges:  Prepandial:   less than 140 mg/dL      Peak postprandial:   less than 180 mg/dL (1-2 hours)      Critically ill patients:  140 - 180 mg/dL   Lab Results  Component Value Date   GLUCAP 146 (H) 10/02/2023   HGBA1C 12.2 (H) 08/27/2023    Review of Glycemic Control  Latest Reference Range & Units 10/02/23 05:35 10/02/23 06:30 10/02/23 07:31  Glucose-Capillary 70 - 99 mg/dL 161 (H) 096 (H) 045 (H)  (H): Data is abnormally high Diabetes history: DM  Outpatient Diabetes medications:  Novolog 0-8 units tid with meals (1 unit/15 grams CHO) and 1 unit for every 50 mg/dL >409 mg/dL Levemir 16 units bid Current orders for Inpatient glycemic control:  IV insulin- to transition to Levemir 16 units every day, Novolog 0-9 units TID   Inpatient Diabetes Program Recommendations:     Note patient had hospital admit both 08/26/23 and 09/04/23 for DKA. Patient has been counseled multiple times on how to take insulin including CHO coverage and correction.  When diet becomes consistent, consider adding Novolog 3 units TID (assuming patient consuming >50% of meals)  Thanks, Lujean Rave, MSN, RNC-OB Diabetes Coordinator [phone] (8a-5p)

## 2023-10-03 DIAGNOSIS — E1010 Type 1 diabetes mellitus with ketoacidosis without coma: Secondary | ICD-10-CM | POA: Diagnosis not present

## 2023-10-03 LAB — HEPARIN LEVEL (UNFRACTIONATED): Heparin Unfractionated: >1.1 [IU]/mL — ABNORMAL HIGH (ref 0.30–0.70)

## 2023-10-03 LAB — CULTURE, BLOOD (ROUTINE X 2)
Culture: NO GROWTH
Culture: NO GROWTH
Special Requests: ADEQUATE
Special Requests: ADEQUATE

## 2023-10-03 LAB — BASIC METABOLIC PANEL
Anion gap: 10 (ref 5–15)
BUN: 10 mg/dL (ref 6–20)
CO2: 27 mmol/L (ref 22–32)
Calcium: 8.6 mg/dL — ABNORMAL LOW (ref 8.9–10.3)
Chloride: 96 mmol/L — ABNORMAL LOW (ref 98–111)
Creatinine, Ser: 0.81 mg/dL (ref 0.61–1.24)
GFR, Estimated: >60 mL/min (ref >60)
Glucose, Bld: 162 mg/dL — ABNORMAL HIGH (ref 70–99)
Potassium: 3.7 mmol/L (ref 3.5–5.1)
Sodium: 133 mmol/L — ABNORMAL LOW (ref 135–145)

## 2023-10-03 LAB — CBC
HCT: 32.7 % — ABNORMAL LOW (ref 39.0–52.0)
Hemoglobin: 11.3 g/dL — ABNORMAL LOW (ref 13.0–17.0)
MCH: 32.7 pg (ref 26.0–34.0)
MCHC: 34.6 g/dL (ref 30.0–36.0)
MCV: 94.5 fL (ref 80.0–100.0)
Platelets: 226 10*3/uL (ref 150–400)
RBC: 3.46 MIL/uL — ABNORMAL LOW (ref 4.22–5.81)
RDW: 12.4 % (ref 11.5–15.5)
WBC: 9.3 10*3/uL (ref 4.0–10.5)
nRBC: 0 % (ref 0.0–0.2)

## 2023-10-03 LAB — GLUCOSE, CAPILLARY: Glucose-Capillary: 388 mg/dL — ABNORMAL HIGH (ref 70–99)

## 2023-10-03 NOTE — Discharge Instructions (Signed)
Outpatient Programs   Narcotics Anonymous 24-HOUR HELPLINE Pre-recorded for Meeting Schedules [facility] [phone]  [email or web address] ALCOHOLICS ANONYMOUS  [facility]  Answering Service [phone] Please Note: All [place] Meetings are Non-smoking [email or web address]     aanorthcarolina.Electrical engineer agencies for assistance with funding  [facility] [address] [place], [place], [place] [address] Phone: [phone]  [employer] [phone]- crisis line  [facility] (statewide facilities/programs) [address] (Medicaid/state funds) [place], [place] [address] [phone] Marcy Panning- [phone] [place]- [phone] Claris [phone]! [email or web address]  [facility] Pharmacist, community (Medicaid/state funds) Crisis line [phone] [facility] [phone] [place] -[phone] [address] [phone]   [facility] [place] (2 Locations) (Medicaid/state funds) --[address]  walk in 8:30-12 and 1-2:30 [place], [id number]   [facility]- [phone] --[address] [place], [place] [address]  [phone] walk in 8:30-12 and 2-3:30                         [facility] Substance Use and Mental [facility] Crisis Center-alternative to emergency rooms, jails or the streets. OPEN [address] HOURS EVERY DAY [address], [place], [place] [address]  [phone]  [place]- [address] [place], [place] [address]  [phone] (24 hours)  [place]- [address] Brooke Dare [phone]  [place]- [address] [place] [phone]                     7 day detox [phone] and [facility] [address] [place], [place] [address] [phone] (24 hours)  [place]- [address] [place], [place] [address] [phone]  [facility]- [address] [place], [place] [address] [phone] (24 hours)  [place] [address] [place], [place]  [address]  [phone]  Turner Daniels- [address] [place] [phone]  [place]- [address] [place] [phone] Medicaid and state funds  Alcohol and Drug Services -  Insurance: Medicaid /State funding/private insurance Methadone, suboxone  [place] [phone] Fax: [phone] [address] [place], [place], [address] [facility] [email or web address] Types of Program: Transitional housing, IOP, Outpatient treatment, [employer] funding/Medicaid Phone: [phone] Fax: [phone] Address: [address], [place] [address]  [facility], [place], most private insurance providers [address] [place], [place] [address] [phone] Augustina Mood Addiction and internal medicine clinic Private insurance/Medicare/private pay Outpatient/suboxone/general medicine [address] [place], [place] [address]   [phone]  (Suboxone)  [facility] (MH/SA) Medicaid  [address]  [place], [place] [address] [phone]   Alcohol/drug Council of [place] Information and referral for programs for pregnant women [phone] [facility] Text  [phone]  [address] Anderson 12pm -6pm  [facility]-SAIOP [place] [phone] [facility] [address] [place], [place] [address] [phone]- Substance abuse treatment  [facility] Medication management and therapy, IOP/PHP [address] [place], [place] [address] [phone] (Medicaid/state funds- individual -private insurance-group) [place]/[facility]- State funds only MH/SA therapy and med management [address] [place], [place] [address] [phone] / [phone] Crisis Center: [phone]  Tom Anderson Prevention and Recovery Inpt and outpt- insurance, Medicaid, self pay [address]  [place], [place] [address] [phone] [address] [place], [place] [address] [phone]   M-F 8-5  Residential  Programs  [place] [facility]  Insurance: Medicaid, some private insurance Types of Program: Medical Detox, Residential Treatment  for [facility] residents Phone: [phone] Fax: [phone] [address], [place], [place], [address]  Fellowship Margo Aye (SelfDetection.tn) Only private insurance providers or self-pay Types of Program: CDIOP, Extended Treatment, Family Therapy, Group Counseling, Inpatient program, Partial Programming  Phone: [phone] Fax:  [phone] Address: [address], [place], [address]  Dreams Insurance: Accepts those with and without insurance,  [facility] sponsorship  Type of Program: Residential Treatment Phone: [phone]   Address: [address], [place], [place], [address] [place] of [place] (VeganReport.com.au) Insurance: [place] - based on those with income [phone]) and without ($285) Types of Program: 90-Day Program  Phone: [phone] Fax: [phone] Address: [address] [place], [place], [address]   Tom Anderson (http://rivera-kline.[email or web address]/) Insurance: n/a Types of Program: Residential Treatment Phone: [phone] Fax: [phone] Address: [address], [place], [place], [address]  Tom Anderson (http://[email or web address]) Insurance: n/a Types of Program: Residential Treatment, Substance Abuse Treatment for homeless women  Phone: [phone] Fax: [phone] Address: [address], [place], [address]  [address] Anderson (ages 83-40) (www.teenchallengeusa.com) Insurance: n/a; $700 entrance fee, including $50 non-refundable application fee  Types of Program: Residential Treatment Phone: [phone] Fax: [phone] Address: [address], [place], [place], [address] (Statewide locations) [facility] [place]  -[place] or [place] [phone]  [employer] (FunnyTan.co.nz)  Insurance:  n/a; fees range from [phone] (rent) Types of Program: Sober living for those in recovery Phone: [phone] [facility]) / [phone] (Will) / [phone] [address] Anderson)    [address] (women) [address] [place], [place] [address] [phone] FAX [phone]  [address] Anderson Work based (manual labor) Residential- minimum 2 years [phone] [address]  [place], [place] [address]  Freedom Anderson Insurance: n/a 9-12 month recovery- WOMEN and children [address] [address]  [place], [place] [address]  Phone: [phone] [facility]  WOMEN 1 yr -Parkway based- no meds [name] [place], [place] [address]  [phone]  [facility]   Alcohol and Drug Services -  Insurance: Medicaid /State funding/private insurance Methadone, suboxone [address] [place], [place] [address] [phone] Emelia Loron-  [address]  [place]  [phone] 7 day detox   [facility] (http://www.[email or web address]) State funds, self-pay, some private insurance providers Phone: [phone] Fax: [phone] Address: [address] [place], [place], [place], [address] [employer] n/a; work-based program for chronic relapse patients  Types of Program: Residential Treatment  Phone: [phone] Fax: [phone] Address: [address] [place], [place],  [address]  [facility] (www.thefellowshiphome.org)  Insurance: n/a; $75-100/week Types of Program: Individual/Group Therapy, 2-year Relapse Prevention program  Phone: [phone] Fax:  [phone] Address: [address], [place], [place], [address] [facility]  Insurance: n/a Types of Program: Residential Treatment, Housing program, Work Therapy Phone: [phone] Fax: [phone] Address:  [address] [place], [place], [address]  [employer]: n/a Types of Programs: Residential Treatment Phone: [phone]    Address: [address], [place], [place], [address]  [employer] - 6-12 months Cardinal/Medicaid/medicare/disability [address] (main office) [place], [place] [address] [phone]  [facility]   Path [facility] [place] (http://www.[email or web address]/) Insurance: n/a; clients must pay a cash fee of $3,220  upfront Types of Program: Residential Treatment Phone: [phone] Fax: [phone] Address: [address], [place], [place], [address]  [facility] Insurance: n/a; must be able to work on site Types of Program: Residential Treatment Phone: [phone] Aurther Loft [place]) / [phone] [address]) Address: [address] [place], [place], [address]  [facility]   [facility] - [employer]- women (http://charlotterescuemission.org/) Insurance: n/a  Types of Programs: Residential Treatment Phone: [phone] Fax: [phone] Address: [address] [place], [place], [address] [facility] Insurance: Medicaid, all private insurance providers not in a network  Types of Program: Drug Education, DUI Treatment, Methadone program, Residential Treatment, Substance Abuse Intensive Outpatient Phone: [phone] Fax: [phone] Address: [address] [place], [place], [address]  Rebound - [employer] - Men Insurance: n/a  Types of Program: Residential Treatment Phone: [phone] Fax: [phone] Address: [address], [place], [place], [address] [facility].  Insurance: n/a; the fee for the first 90 days will be waived upon completion of the entire program. Types of Program: Men and Women's, Families Level,     [address] Anderson for the homeless  Phone: [phone] Fax: [phone] Address: [address] [place], [place], [address]  [facility]   [facility] (ARanked.fi)  Insurance: n/a; [phone] for 30 days, afterwards clients are expected to work onsite to maintain stay  Types of Program: Aftercare services, Day Program,   (partially funded)  Phone: [phone] Fax: [phone] Address: [address] - [address] - [place], [place]  [address]  [facility] [place] (http://www.flynnhickory.org/) Insurance: n/a; $250.00/first two weeks and $125/week afterwards Types of Program: Housing for clients who do not suffer from psychological or medical problems Phone: [phone] Fax: [phone] Address: [address], [place], [place], [address]  [facility] (DrawPay.co.nz) Insurance: n/a; $150 entry fee  Types of Program: Therapeutic Community   Phone: [phone] Fax: [phone]  Address: [address] [place], [place], [address] [place] [employer]: n/a Types of Program: Residential Treatment Phone: [phone] Fax: [phone] Address: [address], [place], [place], [address]  [employer]  Insurance: $300 entry fee-(some scholarships available) Types of Program: Residential Treatment  Phone: [phone] Fax: [phone] Address: [address], [place], [place], [address] [facility]) Insurance: n/a; sliding scale between $6,000-$12,000 depending on income Types of Program: Residential Treatment Phone: [phone] Fax: [phone] Address: [address] 2nd floor Billie Lade [place], [place], [address]  [facility] [place] (women 18-39 yo) 1 year Minimum $400 entrance fee [address] [place], [place] [address] [phone] Fax [phone] [facility] [address] [place] [address] Phone: [phone] Fax [phone] $250 entry fee  [facility] insurance only [address], [place], [place] [address] Phone: [phone]   [place]   [facility] (RainTreatment.es) Insurance: n/a  Types of Program: Emergency overnight stay, Medical Detox, Recovery program for [facility] residents, 6-month Residential Treatment  Phone: [phone] Fax: [phone] Address: [address] [place],  [place], [address] [facility]A (http://www.church.org/) Insurance: n/a Types of Program: Residential Treatment  Phone: [phone] Fax: [phone] Address: [address] [place], [address]   [facility], [place], [place] is accepted but will only pay for certain services Types of Program: Inpatient and Outpatient Treatment Phone: [phone] Fax: [phone]  Address: [address] [place], [place], [address] Other Locations: [facility] [address],  [place]ruz Condon [place], [place], [address] [facility] [address], [place], [place], [address] [facility] [place] (Men Only)  Insurance: n/a; $91/week Types of Program: Housing program for men who are employed full time (up to 6 months) Phone: [phone] Fax: [phone] Address: [address] [place], [place], [address]    [facility] (Men Only) -   program is free for 30 days, afterwards residents may stay on site but are required to have a job or be able to pay $55.00/week. Types of Program: Meals, Shelter, Civil Service fast streamer, Pregnancy services Phone: [phone] Fax: [phone] Address: [address] [place],  [address]  [facility] (RTS) Detox/crisis, residential, [address] [place], [place] [address] Phone: [phone] State funding and financial assistance   [facility] women- 6 mos $500 entry fee [address] [place], [place] [address] [phone] [facility] MH/SA opt and residential [address], [place], [place] [address] Theron Arista [place], [place] [address] [phone]  Other/out of state   [facility] Treatment Insurance: self pay or cardinal/partners Types of Program: Residential Treatment Phone: Men's Division [phone]                Women's Division [phone]   Address:  [facility] [address], [place], [place], [address] [employer]  (http://owlsnestrecovery.com/) Insurance: n/a; fees range from [phone] Types of Program: Intensive Substance Abuse Rehabilitation Phone: [phone] Fax: [phone]  Address: [address], [place], [place], [address]   [facility] (www.bridgetorecovery.org) $2500.00 for 30 day program Types of Program: Transitional Housing Phone: [phone] Fax: [phone] Address: [address], [place], [place], [address]   [employer] Laboring [place]  Insurance: n/a Types of Program: Residential Treatment (does not allow behavioral health medications) Phone: [phone] Fax: [phone]  Address: [address] [place], [address]  [facility] (http://galax.LogTrades.[email or web address])  Insurance: Public affairs consultant, private pay [phone] for 28 days) Types of Program: Chronic Pain Recovery, DUI Treatment, Family Therapy, Gender-Specific Therapy, Opiate Recovery, Residential Treatment Phone: [phone] Fax: [phone] Address: [address] [place], [place], [address] [facility] ([facility] Drug Addiction [facility]  Center)  Medicaid, Medicare and private insurance  providers; cost of $517.00/day  Types of Program: 7-day Detoxification program and 14-day Rehabilitation program  Phone: [phone]: [phone] Address: [address] [place], [place], [address]  [facility] Nationwide locations [employer] only [place] [phone]          Outpatient Psychiatry and Counseling  Therapeutic Alternatives: Mobile Crisis Management:  [phone]  [facility] (Formerly known as [employer])         [address] [place] [address] [place], [place] [address] [phone]  [facility] Services of the [employer] sliding scale fee and walk in schedule: M-F 8am-12pm/1pm-3pm [address] [place], [place] [address] [phone]  [facility] Outpatient Services/ Intensive Outpatient Therapy Program [address] [place], [place]  [address] [phone]  [facility]   [facility] [address]   [address] [place], [place] [address]    [place], [place] [address] [phone]     [phone]  [facility]               [facility] [address] [place] [address]    [place] [address] [phone]  [phone]  Andee Poles, MD    [facility] [address]    [address] [place] [address]    [place] [address] [phone]       [phone]  [facility]   [facility] [address] [place], [place] [phone]     [phone]  [facility]    [facility] [phone] [address] [place] [address], [place], [place]                  [address] [address] [phone]     [place], [place] [address] [phone] Family Solutions: (Spanish speakin) [phone]  [facility]    [facility] [address]    [address] [place], [place] [address]    [place], [place] [address] [phone]     [phone]

## 2023-10-03 NOTE — Plan of Care (Signed)
Problem: Education: Goal: Knowledge of General Education information will improve Description: Including pain rating scale, medication(s)/side effects and non-pharmacologic comfort measures Outcome: Adequate for Discharge   Problem: Health Behavior/Discharge Planning: Goal: Ability to manage health-related needs will improve Outcome: Adequate for Discharge   Problem: Clinical Measurements: Goal: Ability to maintain clinical measurements within normal limits will improve Outcome: Adequate for Discharge Goal: Will remain free from infection Outcome: Adequate for Discharge Goal: Diagnostic test results will improve Outcome: Adequate for Discharge Goal: Respiratory complications will improve Outcome: Adequate for Discharge Goal: Cardiovascular complication will be avoided Outcome: Adequate for Discharge   Problem: Activity: Goal: Risk for activity intolerance will decrease Outcome: Adequate for Discharge   Problem: Nutrition: Goal: Adequate nutrition will be maintained Outcome: Adequate for Discharge   Problem: Coping: Goal: Level of anxiety will decrease Outcome: Adequate for Discharge   Problem: Elimination: Goal: Will not experience complications related to bowel motility Outcome: Adequate for Discharge Goal: Will not experience complications related to urinary retention Outcome: Adequate for Discharge   Problem: Pain Management: Goal: General experience of comfort will improve Outcome: Adequate for Discharge   Problem: Safety: Goal: Ability to remain free from injury will improve Outcome: Adequate for Discharge   Problem: Skin Integrity: Goal: Risk for impaired skin integrity will decrease Outcome: Adequate for Discharge   Problem: Education: Goal: Ability to describe self-care measures that may prevent or decrease complications (Diabetes Survival Skills Education) will improve Outcome: Adequate for Discharge Goal: Individualized Educational Video(s) Outcome:  Adequate for Discharge   Problem: Coping: Goal: Ability to adjust to condition or change in health will improve Outcome: Adequate for Discharge   Problem: Fluid Volume: Goal: Ability to maintain a balanced intake and output will improve Outcome: Adequate for Discharge   Problem: Health Behavior/Discharge Planning: Goal: Ability to identify and utilize available resources and services will improve Outcome: Adequate for Discharge Goal: Ability to manage health-related needs will improve Outcome: Adequate for Discharge   Problem: Metabolic: Goal: Ability to maintain appropriate glucose levels will improve Outcome: Adequate for Discharge   Problem: Nutritional: Goal: Maintenance of adequate nutrition will improve Outcome: Adequate for Discharge Goal: Progress toward achieving an optimal weight will improve Outcome: Adequate for Discharge   Problem: Skin Integrity: Goal: Risk for impaired skin integrity will decrease Outcome: Adequate for Discharge   Problem: Tissue Perfusion: Goal: Adequacy of tissue perfusion will improve Outcome: Adequate for Discharge   Problem: Education: Goal: Ability to describe self-care measures that may prevent or decrease complications (Diabetes Survival Skills Education) will improve Outcome: Adequate for Discharge Goal: Individualized Educational Video(s) Outcome: Adequate for Discharge   Problem: Cardiac: Goal: Ability to maintain an adequate cardiac output will improve Outcome: Adequate for Discharge   Problem: Health Behavior/Discharge Planning: Goal: Ability to identify and utilize available resources and services will improve Outcome: Adequate for Discharge Goal: Ability to manage health-related needs will improve Outcome: Adequate for Discharge   Problem: Fluid Volume: Goal: Ability to achieve a balanced intake and output will improve Outcome: Adequate for Discharge   Problem: Metabolic: Goal: Ability to maintain appropriate  glucose levels will improve Outcome: Adequate for Discharge   Problem: Nutritional: Goal: Maintenance of adequate nutrition will improve Outcome: Adequate for Discharge Goal: Maintenance of adequate weight for body size and type will improve Outcome: Adequate for Discharge   Problem: Respiratory: Goal: Will regain and/or maintain adequate ventilation Outcome: Adequate for Discharge   Problem: Urinary Elimination: Goal: Ability to achieve and maintain adequate renal perfusion and functioning will improve  Outcome: Adequate for Discharge

## 2023-10-03 NOTE — Plan of Care (Signed)

## 2023-10-03 NOTE — Plan of Care (Signed)
Problem: Education: Goal: Knowledge of General Education information will improve Description: Including pain rating scale, medication(s)/side effects and non-pharmacologic comfort measures 10/03/2023 0830 by Clearnce Sorrel, RN Outcome: Adequate for Discharge 10/03/2023 0830 by Clearnce Sorrel, RN Outcome: Adequate for Discharge   Problem: Health Behavior/Discharge Planning: Goal: Ability to manage health-related needs will improve 10/03/2023 0830 by Clearnce Sorrel, RN Outcome: Adequate for Discharge 10/03/2023 0830 by Clearnce Sorrel, RN Outcome: Adequate for Discharge   Problem: Clinical Measurements: Goal: Ability to maintain clinical measurements within normal limits will improve 10/03/2023 0830 by Clearnce Sorrel, RN Outcome: Adequate for Discharge 10/03/2023 0830 by Clearnce Sorrel, RN Outcome: Adequate for Discharge Goal: Will remain free from infection 10/03/2023 0830 by Clearnce Sorrel, RN Outcome: Adequate for Discharge 10/03/2023 0830 by Clearnce Sorrel, RN Outcome: Adequate for Discharge Goal: Diagnostic test results will improve 10/03/2023 0830 by Clearnce Sorrel, RN Outcome: Adequate for Discharge 10/03/2023 0830 by Clearnce Sorrel, RN Outcome: Adequate for Discharge Goal: Respiratory complications will improve 10/03/2023 0830 by Clearnce Sorrel, RN Outcome: Adequate for Discharge 10/03/2023 0830 by Clearnce Sorrel, RN Outcome: Adequate for Discharge Goal: Cardiovascular complication will be avoided 10/03/2023 0830 by Clearnce Sorrel, RN Outcome: Adequate for Discharge 10/03/2023 0830 by Clearnce Sorrel, RN Outcome: Adequate for Discharge   Problem: Activity: Goal: Risk for activity intolerance will decrease 10/03/2023 0830 by Clearnce Sorrel, RN Outcome: Adequate for Discharge 10/03/2023 0830 by Clearnce Sorrel, RN Outcome: Adequate for Discharge    Problem: Nutrition: Goal: Adequate nutrition will be maintained 10/03/2023 0830 by Clearnce Sorrel, RN Outcome: Adequate for Discharge 10/03/2023 0830 by Clearnce Sorrel, RN Outcome: Adequate for Discharge   Problem: Coping: Goal: Level of anxiety will decrease 10/03/2023 0830 by Clearnce Sorrel, RN Outcome: Adequate for Discharge 10/03/2023 0830 by Clearnce Sorrel, RN Outcome: Adequate for Discharge   Problem: Elimination: Goal: Will not experience complications related to bowel motility 10/03/2023 0830 by Clearnce Sorrel, RN Outcome: Adequate for Discharge 10/03/2023 0830 by Clearnce Sorrel, RN Outcome: Adequate for Discharge Goal: Will not experience complications related to urinary retention 10/03/2023 0830 by Clearnce Sorrel, RN Outcome: Adequate for Discharge 10/03/2023 0830 by Clearnce Sorrel, RN Outcome: Adequate for Discharge   Problem: Pain Management: Goal: General experience of comfort will improve 10/03/2023 0830 by Clearnce Sorrel, RN Outcome: Adequate for Discharge 10/03/2023 0830 by Clearnce Sorrel, RN Outcome: Adequate for Discharge   Problem: Safety: Goal: Ability to remain free from injury will improve 10/03/2023 0830 by Clearnce Sorrel, RN Outcome: Adequate for Discharge 10/03/2023 0830 by Clearnce Sorrel, RN Outcome: Adequate for Discharge   Problem: Skin Integrity: Goal: Risk for impaired skin integrity will decrease 10/03/2023 0830 by Clearnce Sorrel, RN Outcome: Adequate for Discharge 10/03/2023 0830 by Clearnce Sorrel, RN Outcome: Adequate for Discharge   Problem: Education: Goal: Ability to describe self-care measures that may prevent or decrease complications (Diabetes Survival Skills Education) will improve 10/03/2023 0830 by Clearnce Sorrel, RN Outcome: Adequate for Discharge 10/03/2023 0830 by Clearnce Sorrel, RN Outcome: Adequate for  Discharge Goal: Individualized Educational Video(s) 10/03/2023 0830 by Clearnce Sorrel, RN Outcome: Adequate for Discharge 10/03/2023 0830 by Clearnce Sorrel, RN Outcome: Adequate for  Discharge   Problem: Coping: Goal: Ability to adjust to condition or change in health will improve 10/03/2023 0830 by Clearnce Sorrel, RN Outcome: Adequate for Discharge 10/03/2023 0830 by Clearnce Sorrel, RN Outcome: Adequate for Discharge   Problem: Fluid Volume: Goal: Ability to maintain a balanced intake and output will improve 10/03/2023 0830 by Clearnce Sorrel, RN Outcome: Adequate for Discharge 10/03/2023 0830 by Clearnce Sorrel, RN Outcome: Adequate for Discharge   Problem: Health Behavior/Discharge Planning: Goal: Ability to identify and utilize available resources and services will improve 10/03/2023 0830 by Clearnce Sorrel, RN Outcome: Adequate for Discharge 10/03/2023 0830 by Clearnce Sorrel, RN Outcome: Adequate for Discharge Goal: Ability to manage health-related needs will improve 10/03/2023 0830 by Clearnce Sorrel, RN Outcome: Adequate for Discharge 10/03/2023 0830 by Clearnce Sorrel, RN Outcome: Adequate for Discharge   Problem: Metabolic: Goal: Ability to maintain appropriate glucose levels will improve 10/03/2023 0830 by Clearnce Sorrel, RN Outcome: Adequate for Discharge 10/03/2023 0830 by Clearnce Sorrel, RN Outcome: Adequate for Discharge   Problem: Nutritional: Goal: Maintenance of adequate nutrition will improve 10/03/2023 0830 by Clearnce Sorrel, RN Outcome: Adequate for Discharge 10/03/2023 0830 by Clearnce Sorrel, RN Outcome: Adequate for Discharge Goal: Progress toward achieving an optimal weight will improve 10/03/2023 0830 by Clearnce Sorrel, RN Outcome: Adequate for Discharge 10/03/2023 0830 by Clearnce Sorrel, RN Outcome: Adequate for Discharge   Problem:  Skin Integrity: Goal: Risk for impaired skin integrity will decrease 10/03/2023 0830 by Clearnce Sorrel, RN Outcome: Adequate for Discharge 10/03/2023 0830 by Clearnce Sorrel, RN Outcome: Adequate for Discharge   Problem: Tissue Perfusion: Goal: Adequacy of tissue perfusion will improve 10/03/2023 0830 by Clearnce Sorrel, RN Outcome: Adequate for Discharge 10/03/2023 0830 by Clearnce Sorrel, RN Outcome: Adequate for Discharge   Problem: Education: Goal: Ability to describe self-care measures that may prevent or decrease complications (Diabetes Survival Skills Education) will improve 10/03/2023 0830 by Clearnce Sorrel, RN Outcome: Adequate for Discharge 10/03/2023 0830 by Clearnce Sorrel, RN Outcome: Adequate for Discharge Goal: Individualized Educational Video(s) 10/03/2023 0830 by Clearnce Sorrel, RN Outcome: Adequate for Discharge 10/03/2023 0830 by Clearnce Sorrel, RN Outcome: Adequate for Discharge   Problem: Cardiac: Goal: Ability to maintain an adequate cardiac output will improve 10/03/2023 0830 by Clearnce Sorrel, RN Outcome: Adequate for Discharge 10/03/2023 0830 by Clearnce Sorrel, RN Outcome: Adequate for Discharge   Problem: Health Behavior/Discharge Planning: Goal: Ability to identify and utilize available resources and services will improve 10/03/2023 0830 by Clearnce Sorrel, RN Outcome: Adequate for Discharge 10/03/2023 0830 by Clearnce Sorrel, RN Outcome: Adequate for Discharge Goal: Ability to manage health-related needs will improve 10/03/2023 0830 by Clearnce Sorrel, RN Outcome: Adequate for Discharge 10/03/2023 0830 by Clearnce Sorrel, RN Outcome: Adequate for Discharge   Problem: Fluid Volume: Goal: Ability to achieve a balanced intake and output will improve 10/03/2023 0830 by Clearnce Sorrel, RN Outcome: Adequate for Discharge 10/03/2023 0830 by Clearnce Sorrel, RN Outcome: Adequate for Discharge   Problem: Metabolic: Goal: Ability to maintain appropriate glucose levels will improve 10/03/2023 0830 by Clearnce Sorrel, RN Outcome: Adequate for Discharge 10/03/2023 0830 by Clearnce Sorrel, RN Outcome: Adequate for Discharge  Problem: Nutritional: Goal: Maintenance of adequate nutrition will improve 10/03/2023 0830 by Clearnce Sorrel, RN Outcome: Adequate for Discharge 10/03/2023 0830 by Clearnce Sorrel, RN Outcome: Adequate for Discharge Goal: Maintenance of adequate weight for body size and type will improve 10/03/2023 0830 by Clearnce Sorrel, RN Outcome: Adequate for Discharge 10/03/2023 0830 by Clearnce Sorrel, RN Outcome: Adequate for Discharge   Problem: Respiratory: Goal: Will regain and/or maintain adequate ventilation 10/03/2023 0830 by Clearnce Sorrel, RN Outcome: Adequate for Discharge 10/03/2023 0830 by Clearnce Sorrel, RN Outcome: Adequate for Discharge   Problem: Urinary Elimination: Goal: Ability to achieve and maintain adequate renal perfusion and functioning will improve 10/03/2023 0830 by Clearnce Sorrel, RN Outcome: Adequate for Discharge 10/03/2023 0830 by Clearnce Sorrel, RN Outcome: Adequate for Discharge

## 2023-10-03 NOTE — Discharge Summary (Signed)
Physician Discharge Summary  Patient ID: Tom Anderson MRN: [id number] DOB/AGE: 12/28/1970 52 y.o.  Admit date: 10/01/2023 Discharge date: 10/03/2023    Discharge Diagnoses:  DKA-resolved                                                  DISCHARGE PLAN BY DIAGNOSIS       Discharge Plan: Diabetic ketoacidosis -Ensure compliance with insulin  Type 1 diabetes -Ensure compliance with  History of depression history Schizophrenia -Continue trazodone -Was on Cymbalta -Not taking medication  History of nonocclusive aortic thrombus -Continue Xarelto  DISCHARGE SUMMARY   Patient presented with altered mental status Found to be in DKA -Was on insulin drip with correction of DKA Awake alert interactive Tolerating diet Agitating to go home     SIGNIFICANT DIAGNOSTIC STUDIES   MICRO DATA  -No significant abnormality ANTIBIOTICS -No antibiotics  CONSULTS No consults  TUBES / LINES -No lines    Discharge Exam: General: Middle-age, does not appear to be in distress Neuro: Moist oral mucosa CV: S1-S2 appreciated PULM: Clear breath sounds GI: [place] output Extremities:   Vitals:   10/03/23 0000 10/03/23 0422 10/03/23 0500 10/03/23 0721  BP:  (!) 130/94  119/79  Pulse:  92  89  Resp:  17  16  Temp:  97.6 F (36.4 C)  98.3 F (36.8 C)  TempSrc:  Oral  Oral  SpO2: 96% 100%  97%  Weight:   57.6 kg   Height:         Discharge Labs  BMET Recent Labs  Lab 09/28/23 0826 09/28/23 1256 10/01/23 2046 10/02/23 0030 10/02/23 0359 10/02/23 1137 10/03/23 0505  NA 134*   < > 133* 138 137 135 133*  K 4.1   < > 3.9 4.4 4.0 3.5 3.7  CL 98   < > 97* 102 102 96* 96*  CO2 22   < > 11* 18* 22 24 27   GLUCOSE 225*   < > 523* 244* 184* 118* 162*  BUN 22*   < > 26* 23* 21* 19 10  CREATININE 1.23   < > 1.79* 1.42* 1.11 1.00 0.81  CALCIUM 9.3   < > 9.5 9.4 9.2 9.1 8.6*  MG 1.8  --   --   --   --   --   --   PHOS 2.7  --   --   --   --   --   --    < > = values in  this interval not displayed.    CBC Recent Labs  Lab 10/01/23 1612 10/01/23 1658 10/02/23 1137 10/03/23 0505  HGB 13.9 15.6 12.7* 11.3*  HCT 43.4 46.0 36.1* 32.7*  WBC 9.3  --  14.8* 9.3  PLT 324  --  257 226    Anti-Coagulation No results for input(s): "INR" in the last 168 hours.  Discharge Instructions     Diet - low sodium heart healthy   Complete by: As directed    Diet Carb Modified   Complete by: As directed    Increase activity slowly   Complete by: As directed    No wound care   Complete by: As directed          Follow-up Information     Darlis Loan C, DO Follow up.   Specialty: Family Medicine Contact information:  [address] [place] [address] [phone]                  Allergies as of 10/03/2023       Reactions   Latex Rash   Nitrous Oxide Nausea And Vomiting        Medication List     TAKE these medications    acetaminophen 325 MG tablet Commonly known as: TYLENOL Take 2 tablets (650 mg total) by mouth every 6 (six) hours as needed for mild pain (or Fever >/= 101).   aspirin EC 81 MG tablet Take 81 mg by mouth daily. Swallow whole.   Blood Glucose Monitoring Suppl Devi 1 each by Does not apply route 3 (three) times daily. May dispense any manufacturer covered by patient's insurance.   BLOOD GLUCOSE TEST STRIPS Strp 1 each by Does not apply route 3 (three) times daily. Use as directed to check blood sugar. May dispense any manufacturer covered by patient's insurance and fits patient's device.   Dexcom G6 Sensor Misc Inject 1 Device into the skin See admin instructions. Place 1 new sensor into the skin every 10 days   FLUOXETINE HCL PO Take 1 capsule by mouth daily. Unknown strength   insulin aspart 100 UNIT/ML FlexPen Commonly known as: NOVOLOG Inject 3 Units into the skin 3 (three) times daily with meals. Do not take if you do not eat a meal.   insulin detemir 100 UNIT/ML injection Commonly known as:  LEVEMIR Inject 0.16 mLs (16 Units total) into the skin 2 (two) times daily.   Lancet Device Misc 1 each by Does not apply route 3 (three) times daily. May dispense any manufacturer covered by patient's insurance.   Lancets Misc 1 each by Does not apply route 3 (three) times daily. Use as directed to check blood sugar. May dispense any manufacturer covered by patient's insurance and fits patient's device.   ondansetron 4 MG tablet Commonly known as: ZOFRAN Take 1 tablet (4 mg total) by mouth every 6 (six) hours as needed for nausea.   pantoprazole 40 MG tablet Commonly known as: PROTONIX Take 1 tablet (40 mg total) by mouth daily.   Pen Needles 31G X 5 MM Misc 1 each by Does not apply route 3 (three) times daily. May dispense any manufacturer covered by patient's insurance.   rivaroxaban 20 MG Tabs tablet Commonly known as: XARELTO Take 1 tablet (20 mg total) by mouth daily with supper.        Disposition: Home  Discharged Condition: Tom Anderson has met maximum benefit of inpatient care and is medically stable and cleared for discharge.  Patient is pending follow up as above.      Time spent on disposition:  33 Minutes.   Signed: Virl Diamond, MD [facility] Cell:[phone]

## 2023-10-03 NOTE — Progress Notes (Signed)
NAME:  Tom Anderson, MRN:  [id number], DOB:  1971-04-24, LOS: 2 ADMISSION DATE:  10/01/2023, CONSULTATION DATE:  11/1 REFERRING MD:  Dr. Marsh Dolly, CHIEF COMPLAINT:  DKA   History of Present Illness:  Patient is a 52 yo M w/ pertinent PMH DMT1, schizophrenia, depression, aortic thrombus (intermittently taking xarelto) presents to [facility] ED on 11/1 w/ DKA.  Patient with frequent admissions for DKA. Most recently discharged on 10/29. Last admission also with possible trazodone overdose. Patient states he has not been taking his insulin since discharge. Having diarrhea, N/V over the past 2 days. Came to [facility] ED on 11/1. On arrival afebrile and wbc 9.3. tachy 110s and bp mildly hypertensive. Patient w/ some confusion. Glucose >1,200, beta H >8. UA w/ ketones. Vbg 7.0, 18, 42, 4. Given IV fluids and started on insulin drip for DKA. CXR no significant findings. Flu/covid negative. Ethanol, salicylate/acetaminophen level wnl. UDS positive for thc. PCCM asked to admit and patient transferred to [facility].   Pertinent ED labs: na 125, k 5.9, creat 1.72  Pertinent  Medical History   Past Medical History:  Diagnosis Date   Asthma    Depression    Diabetes mellitus without complication (HCC)    Schizophrenia (HCC)    Uses self-applied continuous glucose monitoring device     Significant Hospital Events: Including procedures, antibiotic start and stop dates in addition to other pertinent events   11/1 admitted to [facility] w/ dka 11/2 feels well, sugar controlled  Interim History / Subjective:  No overnight events Continues to improve  Objective   Blood pressure 119/79, pulse 89, temperature 98.3 F (36.8 C), temperature source Oral, resp. rate 16, height 5\' 10"  (1.778 m), weight 57.6 kg, SpO2 97%.        Intake/Output Summary (Last 24 hours) at 10/03/2023 0815 Last data filed at 10/02/2023 2320 Gross per 24 hour  Intake 1408.2 ml  Output --  Net 1408.2 ml   Filed Weights   10/01/23  2000 10/02/23 0430 10/03/23 0500  Weight: 51.7 kg 51.7 kg 57.6 kg    Examination: General: Middle-age, does not appear to be in distress HEENT: Moist oral mucosa Neuro: Alert and oriented x 3 CV: S1-S2 appreciated PULM: Clear breath sounds s GI: Soft, bowel sounds appreciated Extremities: warm/dry, no edema  Skin: no rashes or lesions    Resolved Hospital Problem list     Assessment & Plan:    Type 1 diabetes DKA -Anion gap closed -Sugars controlled  Acute kidney injury -Avoid nephrotoxic medications  History of nonocclusive aortic thrombosis -On Xarelto  History of depression History of schizophrenia History of drug abuse -Cessation counseling  Patient feels well  He knows what to do to keep his sugars controlled and promises to do a better job   Medical sales representative (right click and "Reselect all SmartList Selections" daily)   Diet/type: Regular consistency (see orders) DVT prophylaxis: systemic heparin GI prophylaxis: PPI Lines: N/A Foley:  N/A Code Status:  full code Last date of multidisciplinary goals of care discussion [11/1 updated Father Fayrene Fearing over phone]  Labs   CBC: Recent Labs  Lab 09/27/23 2123 09/27/23 2129 09/28/23 1013 10/01/23 1612 10/01/23 1658 10/02/23 1137 10/03/23 0505  WBC 16.0*  --  17.8* 9.3  --  14.8* 9.3  NEUTROABS 12.2*  --   --   --   --   --   --   HGB 12.8*   < > 10.8* 13.9 15.6 12.7* 11.3*  HCT 42.2   < >  30.9* 43.4 46.0 36.1* 32.7*  MCV 108.5*  --  93.1 102.4*  --  94.3 94.5  PLT 399  --  287 324  --  257 226   < > = values in this interval not displayed.    Basic Metabolic Panel: Recent Labs  Lab 09/28/23 0826 09/28/23 1256 10/01/23 2046 10/02/23 0030 10/02/23 0359 10/02/23 1137 10/03/23 0505  NA 134*   < > 133* 138 137 135 133*  K 4.1   < > 3.9 4.4 4.0 3.5 3.7  CL 98   < > 97* 102 102 96* 96*  CO2 22   < > 11* 18* 22 24 27   GLUCOSE 225*   < > 523* 244* 184* 118* 162*  BUN 22*   < > 26* 23* 21* 19 10   CREATININE 1.23   < > 1.79* 1.42* 1.11 1.00 0.81  CALCIUM 9.3   < > 9.5 9.4 9.2 9.1 8.6*  MG 1.8  --   --   --   --   --   --   PHOS 2.7  --   --   --   --   --   --    < > = values in this interval not displayed.   GFR: Estimated Creatinine Clearance: 88.9 mL/min (by C-G formula based on SCr of 0.81 mg/dL). Recent Labs  Lab 09/27/23 2123 09/27/23 2323 09/28/23 0229 09/28/23 0826 09/28/23 1013 10/01/23 1612 10/02/23 1137 10/03/23 0505  WBC 16.0*  --   --   --  17.8* 9.3 14.8* 9.3  LATICACIDVEN 7.6* 7.1* 6.9* 2.7*  --   --   --   --     Liver Function Tests: Recent Labs  Lab 09/27/23 2123 10/01/23 1612  AST 51* 82*  ALT 106* 127*  ALKPHOS 213* 251*  BILITOT 1.4* 1.4*  PROT 7.3 7.6  ALBUMIN 4.7 4.7   Recent Labs  Lab 10/01/23 1612  LIPASE 92*   No results for input(s): "AMMONIA" in the last 168 hours.  ABG    Component Value Date/Time   PHART 7.485 (H) 10/04/2020 0210   PCO2ART 25.5 (L) 10/04/2020 0210   PO2ART 152 (H) 10/04/2020 0210   HCO3 4.7 (L) 10/01/2023 1658   TCO2 5 (L) 10/01/2023 1658   ACIDBASEDEF 25.0 (H) 10/01/2023 1658   O2SAT 59 10/01/2023 1658     Coagulation Profile: No results for input(s): "INR", "PROTIME" in the last 168 hours.  Cardiac Enzymes: No results for input(s): "CKTOTAL", "CKMB", "CKMBINDEX", "TROPONINI" in the last 168 hours.  HbA1C: Hgb A1c MFr Bld  Date/Time Value Ref Range Status  08/27/2023 06:26 AM 12.2 (H) 4.8 - 5.6 % Final    Comment:    (NOTE) Pre diabetes:          5.7%-6.4%  Diabetes:              >6.4%  Glycemic control for   <7.0% adults with diabetes   03/22/2023 02:05 AM 14.2 (H) 4.8 - 5.6 % Final    Comment:    (NOTE) Pre diabetes:          5.7%-6.4%  Diabetes:              >6.4%  Glycemic control for   <7.0% adults with diabetes     CBG: Recent Labs  Lab 10/02/23 1626 10/02/23 2103 10/02/23 2135 10/02/23 2325 10/03/23 0719  GLUCAP 176* 60* 137* 87 388*    Review of Systems:    Review  of Systems  Constitutional:  Negative for fever.  Respiratory:  Negative for shortness of breath.   Cardiovascular:  Negative for chest pain.  Gastrointestinal:  Positive for abdominal pain, diarrhea, nausea and vomiting. Negative for constipation.     Past Medical History:  He,  has a past medical history of Asthma, Depression, Diabetes mellitus without complication (HCC), Schizophrenia (HCC), and Uses self-applied continuous glucose monitoring device.   Surgical History:   Past Surgical History:  Procedure Laterality Date   HERNIA REPAIR       Social History:   reports that he has been smoking cigarettes. He has never used smokeless tobacco. He reports current alcohol use. He reports current drug use. Drug: Marijuana.   Family History:  His family history includes Diabetes in his maternal grandfather.   Allergies Allergies  Allergen Reactions   Latex Rash   Nitrous Oxide Nausea And Vomiting    Virl Diamond, MD [facility] Pager: See Loretha Stapler

## 2023-11-01 ENCOUNTER — Encounter (HOSPITAL_BASED_OUTPATIENT_CLINIC_OR_DEPARTMENT_OTHER): Payer: Self-pay

## 2023-11-01 ENCOUNTER — Observation Stay (HOSPITAL_BASED_OUTPATIENT_CLINIC_OR_DEPARTMENT_OTHER)
Admission: EM | Admit: 2023-11-01 | Discharge: 2023-11-02 | Disposition: A | Discharge status: Home / Self Care | Payer: MEDICAID | Attending: Internal Medicine | Admitting: Internal Medicine

## 2023-11-01 DIAGNOSIS — I5022 Chronic systolic (congestive) heart failure: Secondary | ICD-10-CM | POA: Diagnosis not present

## 2023-11-01 DIAGNOSIS — I7411 Embolism and thrombosis of thoracic aorta: Secondary | ICD-10-CM | POA: Diagnosis not present

## 2023-11-01 DIAGNOSIS — I110 Hypertensive heart disease with heart failure: Secondary | ICD-10-CM | POA: Diagnosis not present

## 2023-11-01 DIAGNOSIS — E876 Hypokalemia: Secondary | ICD-10-CM | POA: Insufficient documentation

## 2023-11-01 DIAGNOSIS — I7410 Embolism and thrombosis of unspecified parts of aorta: Secondary | ICD-10-CM | POA: Diagnosis not present

## 2023-11-01 DIAGNOSIS — Z794 Long term (current) use of insulin: Secondary | ICD-10-CM | POA: Insufficient documentation

## 2023-11-01 DIAGNOSIS — Z91040 Latex allergy status: Secondary | ICD-10-CM | POA: Diagnosis not present

## 2023-11-01 DIAGNOSIS — Z79899 Other long term (current) drug therapy: Secondary | ICD-10-CM | POA: Diagnosis not present

## 2023-11-01 DIAGNOSIS — R7401 Elevation of levels of liver transaminase levels: Secondary | ICD-10-CM | POA: Diagnosis not present

## 2023-11-01 DIAGNOSIS — Z7982 Long term (current) use of aspirin: Secondary | ICD-10-CM | POA: Diagnosis not present

## 2023-11-01 DIAGNOSIS — F209 Schizophrenia, unspecified: Secondary | ICD-10-CM | POA: Insufficient documentation

## 2023-11-01 DIAGNOSIS — E871 Hypo-osmolality and hyponatremia: Secondary | ICD-10-CM | POA: Insufficient documentation

## 2023-11-01 DIAGNOSIS — E1010 Type 1 diabetes mellitus with ketoacidosis without coma: Principal | ICD-10-CM | POA: Insufficient documentation

## 2023-11-01 DIAGNOSIS — E1110 Type 2 diabetes mellitus with ketoacidosis without coma: Secondary | ICD-10-CM | POA: Diagnosis present

## 2023-11-01 DIAGNOSIS — N179 Acute kidney failure, unspecified: Secondary | ICD-10-CM | POA: Insufficient documentation

## 2023-11-01 DIAGNOSIS — Z7901 Long term (current) use of anticoagulants: Secondary | ICD-10-CM | POA: Insufficient documentation

## 2023-11-01 DIAGNOSIS — F17210 Nicotine dependence, cigarettes, uncomplicated: Secondary | ICD-10-CM | POA: Diagnosis not present

## 2023-11-01 LAB — URINALYSIS, MICROSCOPIC (REFLEX)

## 2023-11-01 LAB — BASIC METABOLIC PANEL
Anion gap: 30 — ABNORMAL HIGH (ref 5–15)
Anion gap: 11 (ref 5–15)
BUN: 28 mg/dL — ABNORMAL HIGH (ref 6–20)
BUN: 16 mg/dL (ref 6–20)
CO2: 11 mmol/L — ABNORMAL LOW (ref 22–32)
CO2: 23 mmol/L (ref 22–32)
Calcium: 9.6 mg/dL (ref 8.9–10.3)
Calcium: 8.5 mg/dL — ABNORMAL LOW (ref 8.9–10.3)
Chloride: 92 mmol/L — ABNORMAL LOW (ref 98–111)
Chloride: 96 mmol/L — ABNORMAL LOW (ref 98–111)
Creatinine, Ser: 1.95 mg/dL — ABNORMAL HIGH (ref 0.61–1.24)
Creatinine, Ser: 0.82 mg/dL (ref 0.61–1.24)
GFR, Estimated: 41 mL/min — ABNORMAL LOW (ref >60)
GFR, Estimated: >60 mL/min (ref >60)
Glucose, Bld: 532 mg/dL (ref 70–99)
Glucose, Bld: 137 mg/dL — ABNORMAL HIGH (ref 70–99)
Potassium: 3.4 mmol/L — ABNORMAL LOW (ref 3.5–5.1)
Potassium: 4 mmol/L (ref 3.5–5.1)
Sodium: 133 mmol/L — ABNORMAL LOW (ref 135–145)
Sodium: 130 mmol/L — ABNORMAL LOW (ref 135–145)

## 2023-11-01 LAB — CBC
HCT: 45.7 % (ref 39.0–52.0)
Hemoglobin: 15 g/dL (ref 13.0–17.0)
MCH: 32.7 pg (ref 26.0–34.0)
MCHC: 32.8 g/dL (ref 30.0–36.0)
MCV: 99.6 fL (ref 80.0–100.0)
Platelets: 414 10*3/uL — ABNORMAL HIGH (ref 150–400)
RBC: 4.59 MIL/uL (ref 4.22–5.81)
RDW: 12.6 % (ref 11.5–15.5)
WBC: 17 10*3/uL — ABNORMAL HIGH (ref 4.0–10.5)
nRBC: 0 % (ref 0.0–0.2)

## 2023-11-01 LAB — HEPATIC FUNCTION PANEL
ALT: 115 U/L — ABNORMAL HIGH (ref 0–44)
AST: 110 U/L — ABNORMAL HIGH (ref 15–41)
Albumin: 5.4 g/dL — ABNORMAL HIGH (ref 3.5–5.0)
Alkaline Phosphatase: 228 U/L — ABNORMAL HIGH (ref 38–126)
Bilirubin, Direct: <0.1 mg/dL (ref 0.0–0.2)
Total Bilirubin: 1.2 mg/dL — ABNORMAL HIGH (ref <1.2)
Total Protein: 8.9 g/dL — ABNORMAL HIGH (ref 6.5–8.1)

## 2023-11-01 LAB — I-STAT CHEM 8, ED
BUN: 33 mg/dL — ABNORMAL HIGH (ref 6–20)
BUN: 29 mg/dL — ABNORMAL HIGH (ref 6–20)
Calcium, Ion: 1.08 mmol/L — ABNORMAL LOW (ref 1.15–1.40)
Calcium, Ion: 1.09 mmol/L — ABNORMAL LOW (ref 1.15–1.40)
Chloride: 101 mmol/L (ref 98–111)
Chloride: 105 mmol/L (ref 98–111)
Creatinine, Ser: 1.2 mg/dL (ref 0.61–1.24)
Creatinine, Ser: 1 mg/dL (ref 0.61–1.24)
Glucose, Bld: 506 mg/dL (ref 70–99)
Glucose, Bld: 181 mg/dL — ABNORMAL HIGH (ref 70–99)
HCT: 52 % (ref 39.0–52.0)
HCT: 43 % (ref 39.0–52.0)
Hemoglobin: 17.7 g/dL — ABNORMAL HIGH (ref 13.0–17.0)
Hemoglobin: 14.6 g/dL (ref 13.0–17.0)
Potassium: 4.1 mmol/L (ref 3.5–5.1)
Potassium: 4.5 mmol/L (ref 3.5–5.1)
Sodium: 133 mmol/L — ABNORMAL LOW (ref 135–145)
Sodium: 136 mmol/L (ref 135–145)
TCO2: 14 mmol/L — ABNORMAL LOW (ref 22–32)
TCO2: 20 mmol/L — ABNORMAL LOW (ref 22–32)

## 2023-11-01 LAB — CBG MONITORING, ED
Glucose-Capillary: 569 mg/dL (ref 70–99)
Glucose-Capillary: 443 mg/dL — ABNORMAL HIGH (ref 70–99)
Glucose-Capillary: 296 mg/dL — ABNORMAL HIGH (ref 70–99)
Glucose-Capillary: 180 mg/dL — ABNORMAL HIGH (ref 70–99)
Glucose-Capillary: 143 mg/dL — ABNORMAL HIGH (ref 70–99)
Glucose-Capillary: 121 mg/dL — ABNORMAL HIGH (ref 70–99)
Glucose-Capillary: 119 mg/dL — ABNORMAL HIGH (ref 70–99)
Glucose-Capillary: 130 mg/dL — ABNORMAL HIGH (ref 70–99)

## 2023-11-01 LAB — GLUCOSE, CAPILLARY
Glucose-Capillary: 127 mg/dL — ABNORMAL HIGH (ref 70–99)
Glucose-Capillary: 151 mg/dL — ABNORMAL HIGH (ref 70–99)

## 2023-11-01 LAB — BETA-HYDROXYBUTYRIC ACID: Beta-Hydroxybutyric Acid: >8 mmol/L — ABNORMAL HIGH (ref 0.05–0.27)

## 2023-11-01 LAB — I-STAT VENOUS BLOOD GAS, ED
Acid-base deficit: 18 mmol/L — ABNORMAL HIGH (ref 0.0–2.0)
Bicarbonate: 10.3 mmol/L — ABNORMAL LOW (ref 20.0–28.0)
Calcium, Ion: 1.18 mmol/L (ref 1.15–1.40)
HCT: 51 % (ref 39.0–52.0)
Hemoglobin: 17.3 g/dL — ABNORMAL HIGH (ref 13.0–17.0)
O2 Saturation: 42 %
Potassium: 3.7 mmol/L (ref 3.5–5.1)
Sodium: 132 mmol/L — ABNORMAL LOW (ref 135–145)
TCO2: 11 mmol/L — ABNORMAL LOW (ref 22–32)
pCO2, Ven: 32.5 mm[Hg] — ABNORMAL LOW (ref 44–60)
pH, Ven: 7.109 — CL (ref 7.25–7.43)
pO2, Ven: 31 mm[Hg] — CL (ref 32–45)

## 2023-11-01 LAB — URINALYSIS, ROUTINE W REFLEX MICROSCOPIC
Glucose, UA: >=500 mg/dL — AB
Ketones, ur: >=80 mg/dL — AB
Leukocytes,Ua: NEGATIVE
Nitrite: NEGATIVE
Protein, ur: 100 mg/dL — AB
Specific Gravity, Urine: >=1.03 (ref 1.005–1.030)
pH: 5.5 (ref 5.0–8.0)

## 2023-11-01 MED ORDER — FENTANYL CITRATE PF 50 MCG/ML IJ SOSY
50.0000 ug | PREFILLED_SYRINGE | INTRAMUSCULAR | Status: DC | PRN
  Administered 2023-11-01: 50 ug via INTRAVENOUS
  Filled 2023-11-01: qty 1

## 2023-11-01 MED ORDER — POTASSIUM CHLORIDE 10 MEQ/100ML IV SOLN
10.0000 meq | INTRAVENOUS | Status: AC
  Administered 2023-11-01 (×2): 10 meq via INTRAVENOUS
  Filled 2023-11-01 (×2): qty 100

## 2023-11-01 MED ORDER — INSULIN DETEMIR 100 UNIT/ML ~~LOC~~ SOLN
5.0000 [IU] | Freq: Every day | SUBCUTANEOUS | Status: DC
  Administered 2023-11-02: 5 [IU] via SUBCUTANEOUS
  Filled 2023-11-01 (×2): qty 0.05

## 2023-11-01 MED ORDER — SODIUM CHLORIDE 0.9% FLUSH
10.0000 mL | Freq: Two times a day (BID) | INTRAVENOUS | Status: DC
  Filled 2023-11-01: qty 10

## 2023-11-01 MED ORDER — FENTANYL CITRATE PF 50 MCG/ML IJ SOSY
50.0000 ug | PREFILLED_SYRINGE | Freq: Once | INTRAMUSCULAR | Status: AC
  Administered 2023-11-01: 50 ug via INTRAVENOUS
  Filled 2023-11-01: qty 1

## 2023-11-01 MED ORDER — SODIUM CHLORIDE 0.9 % IV SOLN: INTRAVENOUS | Status: AC | PRN

## 2023-11-01 MED ORDER — SODIUM CHLORIDE 0.9% FLUSH: 10.0000 mL | Freq: Two times a day (BID) | INTRAVENOUS | Status: DC

## 2023-11-01 MED ORDER — LACTATED RINGERS IV BOLUS
1000.0000 mL | Freq: Once | INTRAVENOUS | Status: AC
  Administered 2023-11-01: 1000 mL via INTRAVENOUS

## 2023-11-01 MED ORDER — INSULIN ASPART 100 UNIT/ML IJ SOLN
0.0000 [IU] | Freq: Three times a day (TID) | INTRAMUSCULAR | Status: DC
  Administered 2023-11-02 (×2): 3 [IU] via SUBCUTANEOUS

## 2023-11-01 MED ORDER — ONDANSETRON HCL 4 MG/2ML IJ SOLN
4.0000 mg | Freq: Four times a day (QID) | INTRAMUSCULAR | Status: DC | PRN
  Administered 2023-11-02: 4 mg via INTRAVENOUS
  Filled 2023-11-01: qty 2

## 2023-11-01 MED ORDER — INSULIN DETEMIR 100 UNIT/ML ~~LOC~~ SOLN: 9.0000 [IU] | Freq: Every day | SUBCUTANEOUS | Status: DC

## 2023-11-01 MED ORDER — SODIUM CHLORIDE 0.9 % IV SOLN: Freq: Once | INTRAVENOUS | Status: AC

## 2023-11-01 MED ORDER — ONDANSETRON HCL 4 MG/2ML IJ SOLN
4.0000 mg | Freq: Once | INTRAMUSCULAR | Status: AC
  Administered 2023-11-01: 4 mg via INTRAVENOUS
  Filled 2023-11-01: qty 2

## 2023-11-01 MED ORDER — LACTATED RINGERS IV BOLUS
20.0000 mL/kg | Freq: Once | INTRAVENOUS | Status: AC
  Administered 2023-11-01: 1270 mL via INTRAVENOUS

## 2023-11-01 MED ORDER — DEXTROSE 50 % IV SOLN: 0.0000 mL | INTRAVENOUS | Status: DC | PRN

## 2023-11-01 MED ORDER — SODIUM CHLORIDE 0.9 % IV SOLN: Freq: Once | INTRAVENOUS | Status: DC

## 2023-11-01 MED ORDER — DEXTROSE IN LACTATED RINGERS 5 % IV SOLN: INTRAVENOUS | Status: DC

## 2023-11-01 MED ORDER — ORAL CARE MOUTH RINSE: 15.0000 mL | OROMUCOSAL | Status: DC | PRN

## 2023-11-01 MED ORDER — CHLORHEXIDINE GLUCONATE CLOTH 2 % EX PADS: 6.0000 | MEDICATED_PAD | Freq: Every day | CUTANEOUS | Status: DC

## 2023-11-01 MED ORDER — ENOXAPARIN SODIUM 40 MG/0.4ML IJ SOSY
40.0000 mg | PREFILLED_SYRINGE | INTRAMUSCULAR | Status: DC
  Administered 2023-11-02: 40 mg via SUBCUTANEOUS
  Filled 2023-11-01: qty 0.4

## 2023-11-01 MED ORDER — INSULIN REGULAR(HUMAN) IN NACL 100-0.9 UT/100ML-% IV SOLN
INTRAVENOUS | Status: DC
  Administered 2023-11-01: 11 [IU]/h via INTRAVENOUS
  Filled 2023-11-01: qty 100

## 2023-11-01 MED ORDER — SODIUM CHLORIDE 0.9 % IV SOLN: INTRAVENOUS | Status: AC

## 2023-11-01 NOTE — ED Notes (Signed)
ED TO INPATIENT HANDOFF REPORT  ED Nurse Name and Phone #: Cherly Beach [phone]  S Name/Age/Gender Tom Anderson 52 y.o. male Room/Bed: MH07/MH07  Code Status   Code Status: Prior  Home/SNF/Other Home Patient oriented to: self, place, time, and situation Is this baseline? Yes   Triage Complete: Triage complete  Chief Complaint DKA (diabetic ketoacidosis) (HCC) [E11.10]  Triage Note C/o generalized abdominal pain & emesis since yesterday, type 1 diabetic, hasn't checked cbg.   Allergies Allergies  Allergen Reactions   Latex Rash   Nitrous Oxide Nausea And Vomiting    Level of Care/Admitting Diagnosis ED Disposition     ED Disposition  Admit   Condition  --   Comment  Hospital Area: [facility] [[id number]]  Level of Care: Stepdown [14]  Admit to SDU based on following criteria: Severe physiological/psychological symptoms:  Any diagnosis requiring assessment & intervention at least every 4 hours on an ongoing basis to obtain desired patient outcomes including stability and rehabilitation  Interfacility transfer: Yes  May place patient in observation at [facility] or [facility] if equivalent level of care is available:: No  Covid Evaluation: Asymptomatic - no recent exposure (last 10 days) testing not required  Diagnosis: DKA (diabetic ketoacidosis) [facility]) [[id number]]  Admitting Physician: DEFAULT, PROVIDER [1]  Attending Physician: DEFAULT, PROVIDER [1]          B Medical/Surgery History Past Medical History:  Diagnosis Date   Asthma    Depression    Diabetes mellitus without complication (HCC)    Schizophrenia (HCC)    Uses self-applied continuous glucose monitoring device    Past Surgical History:  Procedure Laterality Date   HERNIA REPAIR       A IV Location/Drains/Wounds Patient Lines/Drains/Airways Status     Active Line/Drains/Airways     Name Placement date Placement time Site Days   Peripheral IV 11/01/23 20 G  Anterior;Proximal;Right Antecubital 11/01/23  1030  Antecubital  less than 1   Peripheral IV 11/01/23 Right;Anterior Forearm 11/01/23  1126  Forearm  less than 1   Pressure Injury 09/28/23 Coccyx Medial Stage 1 -  Intact skin with non-blanchable redness of a localized area usually over a bony prominence. red non-blanchable injury 09/28/23  0200  -- 34            Intake/Output Last 24 hours  Intake/Output Summary (Last 24 hours) at 11/01/2023 2050 Last data filed at 11/01/2023 1554 Gross per 24 hour  Intake 2378.68 ml  Output --  Net 2378.68 ml    Labs/Imaging Results for orders placed or performed during the hospital encounter of 11/01/23 (from the past 48 hour(s))  CBG monitoring, ED     Status: Abnormal   Collection Time: 11/01/23 10:27 AM  Result Value Ref Range   Glucose-Capillary 569 (HH) 70 - 99 mg/dL    Comment: Glucose reference range applies only to samples taken after fasting for at least 8 hours.   Comment 1 Document in Chart   Basic metabolic panel     Status: Abnormal   Collection Time: 11/01/23 10:30 AM  Result Value Ref Range   Sodium 133 (L) 135 - 145 mmol/L   Potassium 3.4 (L) 3.5 - 5.1 mmol/L   Chloride 92 (L) 98 - 111 mmol/L   CO2 11 (L) 22 - 32 mmol/L   Glucose, Bld 532 (HH) 70 - 99 mg/dL    Comment: CRITICAL RESULT CALLED TO, READ BACK BY AND VERIFIED WITH CANDY [place] RN.@1320  ON 12.2.24  BY TCALDWELL MT. Glucose reference range applies only to samples taken after fasting for at least 8 hours.    BUN 28 (H) 6 - 20 mg/dL    Comment: POST-ULTRACENTRIFUGATION   Creatinine, Ser 1.95 (H) 0.61 - 1.24 mg/dL    Comment: POST-ULTRACENTRIFUGATION   Calcium 9.6 8.9 - 10.3 mg/dL   GFR, Estimated 41 (L) >60 mL/min    Comment: (NOTE) Calculated using the CKD-EPI Creatinine Equation (2021)    Anion gap 30 (H) 5 - 15    Comment: ELECTROLYTES REPEATED TO VERIFY Performed at [facility], [address], [place], [place] [address]   CBC     Status: Abnormal    Collection Time: 11/01/23 10:30 AM  Result Value Ref Range   WBC 17.0 (H) 4.0 - 10.5 K/uL   RBC 4.59 4.22 - 5.81 MIL/uL   Hemoglobin 15.0 13.0 - 17.0 g/dL   HCT 60.4 54.0 - 98.1 %   MCV 99.6 80.0 - 100.0 fL   MCH 32.7 26.0 - 34.0 pg   MCHC 32.8 30.0 - 36.0 g/dL   RDW 19.1 47.8 - 29.5 %   Platelets 414 (H) 150 - 400 K/uL   nRBC 0.0 0.0 - 0.2 %    Comment: Performed at [facility], [address] [place], [place] [address]  Hepatic function panel     Status: Abnormal   Collection Time: 11/01/23 10:30 AM  Result Value Ref Range   Total Protein 8.9 (H) 6.5 - 8.1 g/dL   Albumin 5.4 (H) 3.5 - 5.0 g/dL   AST 865 (H) 15 - 41 U/L   ALT 115 (H) 0 - 44 U/L   Alkaline Phosphatase 228 (H) 38 - 126 U/L   Total Bilirubin 1.2 (H) <1.2 mg/dL   Bilirubin, Direct <7.8 0.0 - 0.2 mg/dL   Indirect Bilirubin NOT CALCULATED 0.3 - 0.9 mg/dL    Comment: Performed at [facility], [address] [place], [place] [address]  Beta-hydroxybutyric acid     Status: Abnormal   Collection Time: 11/01/23 10:36 AM  Result Value Ref Range   Beta-Hydroxybutyric Acid >8.00 (H) 0.05 - 0.27 mmol/L    Comment: RESULT CONFIRMED BY MANUAL DILUTION Performed at [facility], [address] [place], [place] [address]   I-Stat venous blood gas, [facility], [facility], [facility])     Status: Abnormal   Collection Time: 11/01/23 10:40 AM  Result Value Ref Range   pH, Ven 7.109 (LL) 7.25 - 7.43   pCO2, Ven 32.5 (L) 44 - 60 mmHg   pO2, Ven 31 (LL) 32 - 45 mmHg   Bicarbonate 10.3 (L) 20.0 - 28.0 mmol/L   TCO2 11 (L) 22 - 32 mmol/L   O2 Saturation 42 %   Acid-base deficit 18.0 (H) 0.0 - 2.0 mmol/L   Sodium 132 (L) 135 - 145 mmol/L   Potassium 3.7 3.5 - 5.1 mmol/L   Calcium, Ion 1.18 1.15 - 1.40 mmol/L   HCT 51.0 39.0 - 52.0 %   Hemoglobin 17.3 (H) 13.0 - 17.0 g/dL   Collection site IV start    Drawn by Nurse    Sample type VENOUS    Comment NOTIFIED PHYSICIAN   I-stat chem 8, ED (not at [facility], [facility] or  [facility])     Status: Abnormal   Collection Time: 11/01/23 11:27 AM  Result Value Ref Range   Sodium 133 (L) 135 - 145 mmol/L   Potassium 4.1 3.5 -  5.1 mmol/L   Chloride 101 98 - 111 mmol/L   BUN 33 (H) 6 - 20 mg/dL   Creatinine, Ser 6.21 0.61 - 1.24 mg/dL   Glucose, Bld 308 (HH) 70 - 99 mg/dL    Comment: Glucose reference range applies only to samples taken after fasting for at least 8 hours.   Calcium, Ion 1.08 (L) 1.15 - 1.40 mmol/L   TCO2 14 (L) 22 - 32 mmol/L   Hemoglobin 17.7 (H) 13.0 - 17.0 g/dL   HCT 65.7 84.6 - 96.2 %   Comment NOTIFIED PHYSICIAN   CBG monitoring, ED     Status: Abnormal   Collection Time: 11/01/23 11:48 AM  Result Value Ref Range   Glucose-Capillary 443 (H) 70 - 99 mg/dL    Comment: Glucose reference range applies only to samples taken after fasting for at least 8 hours.   Comment 1 Notify RN   CBG monitoring, ED     Status: Abnormal   Collection Time: 11/01/23 12:47 PM  Result Value Ref Range   Glucose-Capillary 296 (H) 70 - 99 mg/dL    Comment: Glucose reference range applies only to samples taken after fasting for at least 8 hours.   Comment 1 Notify RN   CBG monitoring, ED     Status: Abnormal   Collection Time: 11/01/23  1:47 PM  Result Value Ref Range   Glucose-Capillary 180 (H) 70 - 99 mg/dL    Comment: Glucose reference range applies only to samples taken after fasting for at least 8 hours.   Comment 1 Notify RN   I-stat chem 8, ED (not at [facility], [facility] or [facility])     Status: Abnormal   Collection Time: 11/01/23  1:55 PM  Result Value Ref Range   Sodium 136 135 - 145 mmol/L   Potassium 4.5 3.5 - 5.1 mmol/L   Chloride 105 98 - 111 mmol/L   BUN 29 (H) 6 - 20 mg/dL   Creatinine, Ser 9.52 0.61 - 1.24 mg/dL   Glucose, Bld 841 (H) 70 - 99 mg/dL    Comment: Glucose reference range applies only to samples taken after fasting for at least 8 hours.   Calcium, Ion 1.09 (L) 1.15 - 1.40 mmol/L   TCO2 20 (L) 22 - 32 mmol/L   Hemoglobin 14.6 13.0 - 17.0 g/dL    HCT 32.4 40.1 - 02.7 %  CBG monitoring, ED     Status: Abnormal   Collection Time: 11/01/23  2:45 PM  Result Value Ref Range   Glucose-Capillary 143 (H) 70 - 99 mg/dL    Comment: Glucose reference range applies only to samples taken after fasting for at least 8 hours.  CBG monitoring, ED     Status: Abnormal   Collection Time: 11/01/23  3:47 PM  Result Value Ref Range   Glucose-Capillary 121 (H) 70 - 99 mg/dL    Comment: Glucose reference range applies only to samples taken after fasting for at least 8 hours.  Urinalysis, Routine w reflex microscopic -Urine, Clean Catch     Status: Abnormal   Collection Time: 11/01/23  4:23 PM  Result Value Ref Range   Color, Urine YELLOW YELLOW   APPearance CLEAR CLEAR   Specific Gravity, Urine >=1.030 1.005 - 1.030   pH 5.5 5.0 - 8.0   Glucose, UA >=500 (A) NEGATIVE mg/dL   Hgb urine dipstick TRACE (A) NEGATIVE   Bilirubin Urine MODERATE (A) NEGATIVE   Ketones, ur >=80 (A) NEGATIVE mg/dL   Protein, ur  100 (A) NEGATIVE mg/dL   Nitrite NEGATIVE NEGATIVE   Leukocytes,Ua NEGATIVE NEGATIVE    Comment: Performed at [facility], [address] [place], [place] [address]  Urinalysis, Microscopic (reflex)     Status: Abnormal   Collection Time: 11/01/23  4:23 PM  Result Value Ref Range   RBC / HPF 0-5 0 - 5 RBC/hpf   WBC, UA 0-5 0 - 5 WBC/hpf   Bacteria, UA FEW (A) NONE SEEN   Squamous Epithelial / HPF 0-5 0 - 5 /HPF   Granular Casts, UA PRESENT     Comment: Performed at [facility], [address] [place], [place] [address]  CBG monitoring, ED     Status: Abnormal   Collection Time: 11/01/23  4:58 PM  Result Value Ref Range   Glucose-Capillary 119 (H) 70 - 99 mg/dL    Comment: Glucose reference range applies only to samples taken after fasting for at least 8 hours.   No results found.  Pending Labs Unresulted Labs (From admission, onward)    None       Vitals/Pain Today's Vitals   11/01/23 1815 11/01/23 1820  11/01/23 1930 11/01/23 2000  BP: (!) 133/92  (!) 127/90 121/76  Pulse: 94  99 96  Resp: 17  19 16   Temp:    97.8 F (36.6 C)  TempSrc:    Axillary  SpO2: 96%  95% 94%  Weight:      Height:      PainSc:  Asleep      Isolation Precautions No active isolations  Medications Medications  dextrose 50 % solution 0-50 mL (has no administration in time range)  0.9 %  sodium chloride infusion (has no administration in time range)  dextrose 5 % in lactated ringers infusion ( Intravenous New Bag/Given 11/01/23 1402)  fentaNYL (SUBLIMAZE) injection 50 mcg (50 mcg Intravenous Given 11/01/23 1046)  ondansetron (ZOFRAN) injection 4 mg (4 mg Intravenous Given 11/01/23 1046)  lactated ringers bolus 1,270 mL (0 mLs Intravenous Stopped 11/01/23 1247)  potassium chloride 10 mEq in 100 mL IVPB (0 mEq Intravenous Stopped 11/01/23 1354)  0.9 %  sodium chloride infusion (0 mLs Intravenous Stopped 11/01/23 1354)  fentaNYL (SUBLIMAZE) injection 50 mcg (50 mcg Intravenous Given 11/01/23 1228)  lactated ringers bolus 1,000 mL (0 mLs Intravenous Stopped 11/01/23 1554)    Mobility walks     Focused Assessments None at this time   R Recommendations: See Admitting Provider Note  Report given to:   Additional Notes: A&O X 4 walks with out asst at home, pt is eating and drinking (NPO  order disc), 2 IVs with  D5 runing at 59ml/hr. Last Glu was 119 at 1900 will recheck before meal.

## 2023-11-01 NOTE — Assessment & Plan Note (Signed)
-  Persistent after improvement in glucose. Keep on continuous IV fluid

## 2023-11-01 NOTE — Assessment & Plan Note (Addendum)
-   Chronic.  Patient denies any recent alcohol use.  Has history of hepatic steatosis.

## 2023-11-01 NOTE — Assessment & Plan Note (Signed)
Last echocardiogram on 03/2023 with EF of 45 to 50%, grade 1 diastolic dysfunction, RV apical hypokinesis, small pericardial effusion.  No significant valvular abnormality.

## 2023-11-01 NOTE — Assessment & Plan Note (Signed)
-  resolved after IV fluid boluses

## 2023-11-01 NOTE — Progress Notes (Signed)
Patient arrived from [facility] with d5Lr infusing at 40cc/hr. Patient able to ambulate from stretcher to bathroom to void.   CHG completed and placed on monitor. MRSA collected. Admitting provider paged.   Blood glucose 151.

## 2023-11-01 NOTE — ED Notes (Signed)
Ice chips per [employer]

## 2023-11-01 NOTE — ED Notes (Signed)
Fall risk armband Fall risk sign on door Patient wearing shoes

## 2023-11-01 NOTE — ED Provider Notes (Cosign Needed Addendum)
[facility] Provider Note   CSN: [id number] Arrival date & time: 11/01/23  1014     History  Chief Complaint  Patient presents with   Abdominal Pain    Tom Anderson is a 52 y.o. male with history of type 1 diabetes on insulin, presenting with concern for abdominal pain, nausea and vomiting that started last night.  He denies any fever or chills.  Reports taking his insulin at home, but has not been monitoring his blood sugars.   Abdominal Pain      Home Medications Prior to Admission medications   Medication Sig Start Date End Date Taking? Authorizing Provider  acetaminophen (TYLENOL) 325 MG tablet Take 2 tablets (650 mg total) by mouth every 6 (six) hours as needed for mild pain (or Fever >/= 101). 05/22/23   Meredeth Ide, MD  aspirin EC 81 MG tablet Take 81 mg by mouth daily. Swallow whole.    [provider]  Blood Glucose Monitoring Suppl DEVI 1 each by Does not apply route 3 (three) times daily. May dispense any manufacturer covered by patient's insurance. 08/27/23   Rai, Delene Ruffini, MD  Continuous Glucose Sensor (DEXCOM G6 SENSOR) MISC Inject 1 Device into the skin See admin instructions. Place 1 new sensor into the skin every 10 days Patient not taking: Reported on 09/04/2023 05/15/23   [provider]  FLUOXETINE HCL PO Take 1 capsule by mouth daily. Unknown strength    [provider]  Glucose Blood (BLOOD GLUCOSE TEST STRIPS) STRP 1 each by Does not apply route 3 (three) times daily. Use as directed to check blood sugar. May dispense any manufacturer covered by patient's insurance and fits patient's device. 08/27/23   Rai, Ripudeep K, MD  insulin aspart (NOVOLOG) 100 UNIT/ML FlexPen Inject 3 Units into the skin 3 (three) times daily with meals. Do not take if you do not eat a meal. 09/28/23   Elberta Fortis, MD  insulin detemir (LEVEMIR) 100 UNIT/ML injection Inject 0.16 mLs (16 Units total) into the  skin 2 (two) times daily. 09/28/23   Elberta Fortis, MD  Insulin Pen Needle (PEN NEEDLES) 31G X 5 MM MISC 1 each by Does not apply route 3 (three) times daily. May dispense any manufacturer covered by patient's insurance. 09/05/23   Danford, Earl Lites, MD  Lancet Device MISC 1 each by Does not apply route 3 (three) times daily. May dispense any manufacturer covered by patient's insurance. 08/27/23   Rai, Delene Ruffini, MD  Lancets MISC 1 each by Does not apply route 3 (three) times daily. Use as directed to check blood sugar. May dispense any manufacturer covered by patient's insurance and fits patient's device. 08/27/23   Rai, Ripudeep K, MD  ondansetron (ZOFRAN) 4 MG tablet Take 1 tablet (4 mg total) by mouth every 6 (six) hours as needed for nausea. 08/27/23   Rai, Ripudeep K, MD  pantoprazole (PROTONIX) 40 MG tablet Take 1 tablet (40 mg total) by mouth daily. 08/27/23   Rai, Delene Ruffini, MD  rivaroxaban (XARELTO) 20 MG TABS tablet Take 1 tablet (20 mg total) by mouth daily with supper. 09/28/23   Elberta Fortis, MD      Allergies    Latex and Nitrous oxide    Review of Systems   Review of Systems  Gastrointestinal:  Positive for abdominal pain.    Physical Exam Updated Vital Signs BP (!) 144/97 (BP Location: Left Arm)   Pulse (!) 104  Temp 97.6 F (36.4 C) (Oral)   Resp 14   Ht 5\' 10"  (1.778 m)   Wt 63.5 kg   SpO2 97%   BMI 20.09 kg/m  Physical Exam Vitals and nursing note reviewed.  Constitutional:      General: He is not in acute distress.    Appearance: He is well-developed.     Comments: Ill and weak appearing, No active emesis  HENT:     Head: Normocephalic and atraumatic.  Eyes:     Conjunctiva/sclera: Conjunctivae normal.  Cardiovascular:     Rate and Rhythm: Regular rhythm. Tachycardia present.     Heart sounds: No murmur heard. Pulmonary:     Effort: Pulmonary effort is normal. No respiratory distress.     Breath sounds: Normal breath sounds.     Comments:  Tachypnea Abdominal:     Palpations: Abdomen is soft.     Tenderness: There is abdominal tenderness.     Comments: Epigastric tenderness palpation  Musculoskeletal:        General: No swelling.     Cervical back: Neck supple.  Skin:    General: Skin is warm and dry.     Capillary Refill: Capillary refill takes less than 2 seconds.  Neurological:     Mental Status: He is alert.  Psychiatric:        Mood and Affect: Mood normal.     ED Results / Procedures / Treatments   Labs (all labs ordered are listed, but only abnormal results are displayed) Labs Reviewed  BASIC METABOLIC PANEL - Abnormal; Notable for the following components:      Result Value   Sodium 133 (*)    Potassium 3.4 (*)    Chloride 92 (*)    CO2 11 (*)    Glucose, Bld 532 (*)    BUN 28 (*)    Creatinine, Ser 1.95 (*)    GFR, Estimated 41 (*)    Anion gap 30 (*)    All other components within normal limits  CBC - Abnormal; Notable for the following components:   WBC 17.0 (*)    Platelets 414 (*)    All other components within normal limits  BETA-HYDROXYBUTYRIC ACID - Abnormal; Notable for the following components:   Beta-Hydroxybutyric Acid >8.00 (*)    All other components within normal limits  HEPATIC FUNCTION PANEL - Abnormal; Notable for the following components:   Total Protein 8.9 (*)    Albumin 5.4 (*)    AST 110 (*)    ALT 115 (*)    Alkaline Phosphatase 228 (*)    Total Bilirubin 1.2 (*)    All other components within normal limits  CBG MONITORING, ED - Abnormal; Notable for the following components:   Glucose-Capillary 569 (*)    All other components within normal limits  I-STAT VENOUS BLOOD GAS, ED - Abnormal; Notable for the following components:   pH, Ven 7.109 (*)    pCO2, Ven 32.5 (*)    pO2, Ven 31 (*)    Bicarbonate 10.3 (*)    TCO2 11 (*)    Acid-base deficit 18.0 (*)    Sodium 132 (*)    Hemoglobin 17.3 (*)    All other components within normal limits  I-STAT CHEM 8, ED -  Abnormal; Notable for the following components:   Sodium 133 (*)    BUN 33 (*)    Glucose, Bld 506 (*)    Calcium, Ion 1.08 (*)    TCO2 14 (*)  Hemoglobin 17.7 (*)    All other components within normal limits  CBG MONITORING, ED - Abnormal; Notable for the following components:   Glucose-Capillary 443 (*)    All other components within normal limits  CBG MONITORING, ED - Abnormal; Notable for the following components:   Glucose-Capillary 296 (*)    All other components within normal limits  I-STAT CHEM 8, ED - Abnormal; Notable for the following components:   BUN 29 (*)    Glucose, Bld 181 (*)    Calcium, Ion 1.09 (*)    TCO2 20 (*)    All other components within normal limits  CBG MONITORING, ED - Abnormal; Notable for the following components:   Glucose-Capillary 180 (*)    All other components within normal limits  CBG MONITORING, ED - Abnormal; Notable for the following components:   Glucose-Capillary 143 (*)    All other components within normal limits  URINALYSIS, ROUTINE W REFLEX MICROSCOPIC    EKG None  Radiology No results found.  Procedures .Critical Care  Performed by: Arabella Merles, PA-C Authorized by: Arabella Merles, PA-C   Critical care provider statement:    Critical care time (minutes):  45   Critical care was necessary to treat or prevent imminent or life-threatening deterioration of the following conditions:  Endocrine crisis   Critical care was time spent personally by me on the following activities:  Development of treatment plan with patient or surrogate, discussions with consultants, evaluation of patient's response to treatment, examination of patient, ordering and review of laboratory studies, ordering and review of radiographic studies, ordering and performing treatments and interventions, pulse oximetry, re-evaluation of patient's condition, review of old charts and obtaining history from patient or surrogate   Care discussed with:  admitting provider       Medications Ordered in ED Medications  insulin regular, human (MYXREDLIN) 100 units/ 100 mL infusion (1.5 Units/hr Intravenous Rate/Dose Change 11/01/23 1447)  dextrose 50 % solution 0-50 mL (has no administration in time range)  0.9 %  sodium chloride infusion (has no administration in time range)  dextrose 5 % in lactated ringers infusion ( Intravenous New Bag/Given 11/01/23 1402)  fentaNYL (SUBLIMAZE) injection 50 mcg (50 mcg Intravenous Given 11/01/23 1046)  ondansetron (ZOFRAN) injection 4 mg (4 mg Intravenous Given 11/01/23 1046)  lactated ringers bolus 1,270 mL (0 mLs Intravenous Stopped 11/01/23 1247)  potassium chloride 10 mEq in 100 mL IVPB (0 mEq Intravenous Stopped 11/01/23 1354)  0.9 %  sodium chloride infusion (0 mLs Intravenous Stopped 11/01/23 1354)  fentaNYL (SUBLIMAZE) injection 50 mcg (50 mcg Intravenous Given 11/01/23 1228)  lactated ringers bolus 1,000 mL (1,000 mLs Intravenous New Bag/Given 11/01/23 1505)    ED Course/ Medical Decision Making/ A&P                                 Medical Decision Making Amount and/or Complexity of Data Reviewed Labs: ordered.  Risk Prescription drug management. Decision regarding hospitalization.     Differential diagnosis includes but is not limited to Cholelithiasis, cholangitis, choledocholithiasis, peptic ulcer, gastritis, gastroenteritis, appendicitis, IBS, IBD, DKA, nephrolithiasis, UTI, pyelonephritis, pancreatitis, diverticulitis, mesenteric ischemia, abdominal aortic aneurysm, small bowel obstruction, volvulus, testicular torsion, ovarian torsion, and females of childbearing age pregnancy   ED Course:  Upon initial evaluation, patient appeared uncomfortable due to abdominal pain and generally weak.  He is also tachypneic and tachycardic.  He is known diabetic, CBG upon arrival  569.  Suspecting DKA.  He asks for pain medication, was given 50 mcg fentanyl.  Was also given 4 mg Zofran for nausea.   Patient's blood is lipemic, needs to be sent out.  Awaiting BMP and his beta hydroxybutyric acid. I-STAT VBG with pH at 7.1 and given his blood glucose in the 500s, suspect DKA.   He was started on LR bolus, insulin.  Potassium at 4.1 currently, does not need any potassium repletion currently.  Hepatic panel shows elevated AST, ALT, alk phos, and bilirubin.  However, this seems to be at patient's baseline. Denies any alcohol use. States he does not use any other medications besides insulin and Xarelto on a daily basis. Upon re-evaluation, patient reports pain is returning. Will give another fentanyl.  Repeat CBG after starting insulin at 443.  BMP with anion gap at 30, creatinine at 1.95 which is increased from 0.81 that was taken 4 weeks ago.  His beta hydroxybutyric acid is over 8.  Patient with severe DKA, will need admission given severity of the acidosis and anion gap and persistent tachycardia. Addendum 3:51 PM.  Patient's CBG at 121.  Will hold on insulin for now. I have cancelled order for the insulin drip  Impression: DKA AKI  Disposition:  Admitted to [facility] the case was discussed with the admitting physician Dr. Lanae Boast   Lab Tests: I Ordered, and personally interpreted labs.  The pertinent results include:   CBC with leukocytosis of 17 I-STAT VBG with pH of 7.1 I-STAT Chem-8 with potassium 4.1 CBG 569 upon arrival Hepatic function panel with elevated AST at 110, ALT 115, alk phos 228, T. bili 1.2 BMP with anion gap at 30, creatinine 1.95 Beta-hydroxybutyric acid over 8   Cardiac Monitoring: / EKG: The patient was maintained on a cardiac monitor.  I personally viewed and interpreted the cardiac monitored which showed an underlying rhythm of: Sinus tachycardia   Consultations Obtained: I requested consultation with the Hospitalist Dr. Lanae Boast,  and discussed lab and imaging findings as well as pertinent plan - they recommend: Admission, requested repeat BMP to  recheck gap and another LR bolus                Final Clinical Impression(s) / ED Diagnoses Final diagnoses:  Diabetic ketoacidosis without coma associated with type 1 diabetes mellitus [facility])    Rx / DC Orders ED Discharge Orders     None         Arabella Merles, PA-C 11/01/23 1516    Arabella Merles, PA-C 11/01/23 1552    Maia Plan, MD 11/02/23 [phone]

## 2023-11-01 NOTE — Assessment & Plan Note (Addendum)
-  Pt with uncontrolled type 1 diabetes (he A1c of 12.2 on 08/2023) secondary to noncompliance with insulin presenting in DKA.  pH of 7.109, bicarb of 10, anion gap of 30.  Patient initially was started on insulin infusion in the ED but this was discontinued due to improvement in his glucose but no repeat BMP to verify anion gap closure.  He was not placed on basal insulin. -Stat BMP obtained showed anion gap closure and normalization of Bicarb. Will place on 5 units basal Levemir and moderate SSI ACHS. Home regimen he is on 9 units of Levermir and Novolog SSI.

## 2023-11-01 NOTE — ED Notes (Signed)
[facility] on site to transport patient to [facility] 

## 2023-11-01 NOTE — Assessment & Plan Note (Signed)
-  replenished and resolved

## 2023-11-01 NOTE — H&P (Signed)
History and Physical    Patient: Tom Anderson ZOX:[id number] DOB: 01/22/1971 DOA: 11/01/2023 DOS: the patient was seen and examined on 11/01/2023 PCP: Montez Hageman, DO  Patient coming from: Home  Chief Complaint:  Chief Complaint  Patient presents with   Abdominal Pain   HPI: Tom Anderson is a 52 y.o. 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  Pt reports diffuse abdominal pain that started yesterday with nausea and vomiting. Reports taking 9 units of Levemir and he thinks maybe 12 units of Novolog with mealtimes. Says he may have missed a dose. He has hx of aortic thrombus diagnosed in April and reports only taking his Xarelto intermittently but per documentation vascular wanted anticoagulation for 3 months with repeat imaging which it appears he did not follow up on.  Reports tobacco and marijuana use. Denies any current alcohol or cocaine use. Although says that last week for some reason he took all of his step-dad's heart medications. Denies any suicidal ideations.   On arrival to the ED, patient was afebrile, tachycardic with heart rate of 110, BP of 147/113 on room air.  pH was acidotic at 7.109 with bicarb of 10.  CBC with leukocytosis of 17, hemoglobin was hemoconcentrated at 15, thrombocytosis of 414.  CMP was notable for pseudohyponatremia of 133, potassium of 4.1, CBG of 506 with anion gap of 30.  LFTs noted to be chronically elevated although has upward trended more today compared to prior with AST at 110, ALT of 115, alkaline phosphatase of 228.  Mildly elevated total bilirubin of 1.2.  Patient received fluids and was initially on insulin infusion in the ED but this was discontinued by ED PA after glucose was noted to have downward trended to 121.  However no repeat basic metabolic panel was obtained to  assess for closure of anion gap.  On arrival, glucose of 151. He reports improved abdominal pain and nausea.  Only reports that his body feels "heavy" Review of Systems: As mentioned in the history of present illness. All other systems reviewed and are negative. Past Medical History:  Diagnosis Date   Asthma    Depression    Diabetes mellitus without complication (HCC)    Schizophrenia (HCC)    Uses self-applied continuous glucose monitoring device    Past Surgical History:  Procedure Laterality Date   HERNIA REPAIR     Social History:  reports that he has been smoking cigarettes. He has never used smokeless tobacco. He reports current alcohol use. He reports current drug use. Drug: Marijuana.  Allergies  Allergen Reactions   Latex Rash   Nitrous Oxide Nausea And Vomiting    Family History  Problem Relation Age of Onset   Diabetes Maternal Grandfather     Prior to Admission medications   Medication Sig Start Date End Date Taking? Authorizing Provider  acetaminophen (TYLENOL) 325 MG tablet Take 2 tablets (650 mg total) by mouth every 6 (six) hours as needed for mild pain (or Fever >/= 101). 05/22/23   Meredeth Ide, MD  aspirin EC 81 MG tablet Take 81 mg by mouth daily. Swallow whole.    [provider]  Blood Glucose Monitoring Suppl DEVI 1 each by Does not apply route 3 (three) times daily. May dispense any manufacturer covered by patient's insurance. 08/27/23   Rai, Delene Ruffini, MD  Continuous Glucose Sensor [facility]  G6 SENSOR) MISC Inject 1 Device into the skin See admin instructions. Place 1 new sensor into the skin every 10 days Patient not taking: Reported on 09/04/2023 05/15/23   [provider]  FLUOXETINE HCL PO Take 1 capsule by mouth daily. Unknown strength    [provider]  Glucose Blood (BLOOD GLUCOSE TEST STRIPS) STRP 1 each by Does not apply route 3 (three) times daily. Use as directed to check blood sugar. May dispense any manufacturer  covered by patient's insurance and fits patient's device. 08/27/23   Rai, Ripudeep K, MD  insulin aspart (NOVOLOG) 100 UNIT/ML FlexPen Inject 3 Units into the skin 3 (three) times daily with meals. Do not take if you do not eat a meal. 09/28/23   Elberta Fortis, MD  insulin detemir (LEVEMIR) 100 UNIT/ML injection Inject 0.16 mLs (16 Units total) into the skin 2 (two) times daily. 09/28/23   Elberta Fortis, MD  Insulin Pen Needle (PEN NEEDLES) 31G X 5 MM MISC 1 each by Does not apply route 3 (three) times daily. May dispense any manufacturer covered by patient's insurance. 09/05/23   Danford, Earl Lites, MD  Lancet Device MISC 1 each by Does not apply route 3 (three) times daily. May dispense any manufacturer covered by patient's insurance. 08/27/23   Rai, Delene Ruffini, MD  Lancets MISC 1 each by Does not apply route 3 (three) times daily. Use as directed to check blood sugar. May dispense any manufacturer covered by patient's insurance and fits patient's device. 08/27/23   Rai, Ripudeep K, MD  ondansetron (ZOFRAN) 4 MG tablet Take 1 tablet (4 mg total) by mouth every 6 (six) hours as needed for nausea. 08/27/23   Rai, Ripudeep K, MD  pantoprazole (PROTONIX) 40 MG tablet Take 1 tablet (40 mg total) by mouth daily. 08/27/23   Rai, Delene Ruffini, MD  rivaroxaban (XARELTO) 20 MG TABS tablet Take 1 tablet (20 mg total) by mouth daily with supper. 09/28/23   Elberta Fortis, MD    Physical Exam: Vitals:   11/01/23 1815 11/01/23 1930 11/01/23 2000 11/01/23 2100  BP: (!) 133/92 (!) 127/90 121/76 (!) 142/110  Pulse: 94 99 96 95  Resp: 17 19 16 20   Temp:   97.8 F (36.6 C)   TempSrc:   Axillary   SpO2: 96% 95% 94% (!) 84%  Weight:      Height:       Constitutional: NAD, calm, comfortable, thin chronically ill-appearing male appearing older than stated age lying in bed alert Eyes: lids and conjunctivae normal ENMT: Mucous membranes are moist.  Missing dentition.   Neck: normal, supple Respiratory:  clear to auscultation bilaterally, no wheezing, no crackles. Normal respiratory effort. No accessory muscle use.  Cardiovascular: Regular rate and rhythm, no murmurs / rubs / gallops. No extremity edema. Abdomen: Soft, nontender nondistended.   Musculoskeletal: no clubbing / cyanosis. No joint deformity upper and lower extremities. Normal muscle tone.  Skin: no rashes, lesions, ulcers. No induration Neurologic: CN 2-12 grossly intact. Strength 5/5 in all 4.  Psychiatric: Alert and oriented x 3. Normal mood. Data Reviewed:  See HPI   Assessment and Plan: * DKA (diabetic ketoacidosis) (HCC) -Pt with uncontrolled type 1 diabetes (he A1c of 12.2 on 08/2023) secondary to noncompliance with insulin presenting in DKA.  pH of 7.109, bicarb of 10, anion gap of 30.  Patient initially was started on insulin infusion in the ED but this was discontinued due to improvement in his glucose but no repeat BMP  to verify anion gap closure.  He was not placed on basal insulin. -Stat BMP obtained showed anion gap closure and normalization of Bicarb. Will place on 5 units basal Levemir and moderate SSI ACHS. Home regimen he is on 9 units of Levermir and Novolog SSI.   Hypokalemia -replenished and resolved  Aortic thrombus (HCC) Hx of aortic thrombus diagnosed in 03/2023.  He was evaluated by vascular surgery at that time who recommended 3 months of anticoagulation with follow-up imaging.  Patient has not followed up and has been noncompliant with Xarelto. -Will repeat CTA abdomen/pelvis  Transaminitis - Chronic.  Patient denies any recent alcohol use.  Has history of hepatic steatosis.  Chronic systolic CHF (congestive heart failure) (HCC) Last echocardiogram on 03/2023 with EF of 45 to 50%, grade 1 diastolic dysfunction, RV apical hypokinesis, small pericardial effusion.  No significant valvular abnormality.  Hyponatremia -Persistent after improvement in glucose. Keep on continuous IV fluid  Schizophrenia  (HCC) - Normal mood.  Patient not taking any medications.  AKI (acute kidney injury) (HCC) -resolved after IV fluid boluses      Advance Care Planning:Full  Consults: none  Family Communication: none at bedside  Severity of Illness: The appropriate patient status for this patient is OBSERVATION. Observation status is judged to be reasonable and necessary in order to provide the required intensity of service to ensure the patient's safety. The patient's presenting symptoms, physical exam findings, and initial radiographic and laboratory data in the context of their medical condition is felt to place them at decreased risk for further clinical deterioration. Furthermore, it is anticipated that the patient will be medically stable for discharge from the hospital within 2 midnights of admission.   Author: Anselm Jungling, DO 11/01/2023 11:57 PM  For on call review [email or web address].

## 2023-11-01 NOTE — Assessment & Plan Note (Signed)
-   Normal mood.  Patient not taking any medications.

## 2023-11-01 NOTE — ED Triage Notes (Signed)
C/o generalized abdominal pain & emesis since yesterday, type 1 diabetic, hasn't checked cbg.

## 2023-11-01 NOTE — Assessment & Plan Note (Signed)
Hx of aortic thrombus diagnosed in 03/2023.  He was evaluated by vascular surgery at that time who recommended 3 months of anticoagulation with follow-up imaging.  Patient has not followed up and has been noncompliant with Xarelto. -Will repeat CTA abdomen/pelvis

## 2023-11-01 NOTE — ED Notes (Signed)
Water per [employer]

## 2023-11-02 ENCOUNTER — Encounter (HOSPITAL_COMMUNITY): Payer: Self-pay

## 2023-11-02 ENCOUNTER — Other Ambulatory Visit: Payer: Self-pay

## 2023-11-02 ENCOUNTER — Observation Stay (HOSPITAL_COMMUNITY): Payer: MEDICAID

## 2023-11-02 DIAGNOSIS — E1010 Type 1 diabetes mellitus with ketoacidosis without coma: Secondary | ICD-10-CM | POA: Diagnosis not present

## 2023-11-02 LAB — CBC
HCT: 34.2 % — ABNORMAL LOW (ref 39.0–52.0)
Hemoglobin: 12 g/dL — ABNORMAL LOW (ref 13.0–17.0)
MCH: 33.4 pg (ref 26.0–34.0)
MCHC: 35.1 g/dL (ref 30.0–36.0)
MCV: 95.3 fL (ref 80.0–100.0)
Platelets: 317 10*3/uL (ref 150–400)
RBC: 3.59 MIL/uL — ABNORMAL LOW (ref 4.22–5.81)
RDW: 12.5 % (ref 11.5–15.5)
WBC: 15.3 10*3/uL — ABNORMAL HIGH (ref 4.0–10.5)
nRBC: 0 % (ref 0.0–0.2)

## 2023-11-02 LAB — RAPID URINE DRUG SCREEN, HOSP PERFORMED
Amphetamines: NOT DETECTED
Barbiturates: NOT DETECTED
Benzodiazepines: NOT DETECTED
Cocaine: NOT DETECTED
Opiates: NOT DETECTED
Tetrahydrocannabinol: POSITIVE — AB

## 2023-11-02 LAB — BASIC METABOLIC PANEL
Anion gap: 7 (ref 5–15)
BUN: 15 mg/dL (ref 6–20)
CO2: 28 mmol/L (ref 22–32)
Calcium: 8.5 mg/dL — ABNORMAL LOW (ref 8.9–10.3)
Chloride: 94 mmol/L — ABNORMAL LOW (ref 98–111)
Creatinine, Ser: 0.89 mg/dL (ref 0.61–1.24)
GFR, Estimated: >60 mL/min (ref >60)
Glucose, Bld: 175 mg/dL — ABNORMAL HIGH (ref 70–99)
Potassium: 4.1 mmol/L (ref 3.5–5.1)
Sodium: 129 mmol/L — ABNORMAL LOW (ref 135–145)

## 2023-11-02 LAB — GLUCOSE, CAPILLARY
Glucose-Capillary: 187 mg/dL — ABNORMAL HIGH (ref 70–99)
Glucose-Capillary: 188 mg/dL — ABNORMAL HIGH (ref 70–99)

## 2023-11-02 LAB — MRSA NEXT GEN BY PCR, NASAL: MRSA by PCR Next Gen: NOT DETECTED

## 2023-11-02 MED ORDER — IOHEXOL 350 MG/ML SOLN
100.0000 mL | Freq: Once | INTRAVENOUS | Status: AC | PRN
  Administered 2023-11-02: 100 mL via INTRAVENOUS

## 2023-11-02 MED ORDER — SODIUM CHLORIDE (PF) 0.9 % IJ SOLN
INTRAMUSCULAR | Status: AC
  Filled 2023-11-02: qty 50

## 2023-11-02 NOTE — TOC CM/SW Note (Signed)
Transition of Care [facility]) - Inpatient Brief Assessment   Patient Details  Name: Tom Anderson MRN: [id number] Date of Birth: [birth year]  [facility]) CM/SW Contact:    Darleene Cleaver, LCSW Phone Number: 11/02/2023, 12:48 PM   Clinical Narrative:  Patient does not have any SDOH needs, he has insurance, and also has a PCP.  No anticipated TOC needs.  Transition of Care Asessment: Insurance and Status: Insurance coverage has been reviewed Patient has primary care physician: Yes Home environment has been reviewed: Yes Prior level of function:: Indep Prior/Current Home Services: No current home services Social Determinants of Health Reivew: SDOH reviewed no interventions necessary Readmission risk has been reviewed: Yes Transition of care needs: no transition of care needs at this time

## 2023-11-02 NOTE — Plan of Care (Signed)
  Problem: Education: Goal: Knowledge of General Education information will improve Description: Including pain rating scale, medication(s)/side effects and non-pharmacologic comfort measures Outcome: Progressing   Problem: Clinical Measurements: Goal: Ability to maintain clinical measurements within normal limits will improve Outcome: Progressing Goal: Diagnostic test results will improve Outcome: Progressing  Blood sugars WNL

## 2023-11-02 NOTE — Discharge Summary (Signed)
Physician Discharge Summary  Tom Anderson ZOX:[id number] DOB: 04-May-1971 DOA: 11/01/2023  PCP: Montez Hageman, DO  Admit date: 11/01/2023 Discharge date: 11/02/2023  Admitted From: home Disposition:  home Recommendations for Outpatient Follow-up:  Follow up with PCP in 1-2 weeks Please obtain BMP/CBC in one week  Home Health: none Equipment/Devices:none  Discharge Condition:stable CODE STATUS:full Diet recommendation: cardiac  Brief/Interim Summary:52 y.o. 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   Pt reports diffuse abdominal pain that started yesterday with nausea and vomiting. Reports taking 9 units of Levemir and he thinks maybe 12 units of Novolog with mealtimes. Says he may have missed a dose. He has hx of aortic thrombus diagnosed in April and reports only taking his Xarelto intermittently but per documentation vascular wanted anticoagulation for 3 months with repeat imaging which it appears he did not follow up on.  Reports tobacco and marijuana use. Denies any current alcohol or cocaine use. Although says that last week for some reason he took all of his step-dad's heart medications. Denies any suicidal ideations.  On arrival to the ED, patient was afebrile, tachycardic with heart rate of 110, BP of 147/113 on room air.  pH was acidotic at 7.109 with bicarb of 10.  Discharge Diagnoses:  Principal Problem:   DKA (diabetic ketoacidosis) (HCC) Active Problems:   AKI (acute kidney injury) (HCC)   Schizophrenia (HCC)   Hyponatremia   Chronic systolic CHF (congestive heart failure) (HCC)   Transaminitis   Aortic thrombus (HCC)   Hypokalemia   Pressure Injury 09/28/23 Coccyx Medial Stage 1 -  Intact skin with non-blanchable redness of a localized area usually over a bony prominence. red non-blanchable injury  (Active)  09/28/23 0200  Location: Coccyx  Location Orientation: Medial  Staging: Stage 1 -  Intact skin with non-blanchable redness of a localized area usually over a bony prominence.  Wound Description (Comments): red non-blanchable injury  Present on Admission: Yes   DKA (diabetic ketoacidosis) (HCC) -Pt with uncontrolled type 1 diabetes (he A1c of 12.2 on 08/2023) secondary to noncompliance with insulin presenting in DKA.  pH of 7.109, bicarb of 10, anion gap of 30.  Patient initially was started on insulin infusion in the ED but this was discontinued due to improvement in his glucose did repeat BMP showed closure of the gap and normalization of bicarb.  He was noncompliant to insulin and diet at home.  He did tell me that he drank a lot of eggnog.   Hypokalemia -replenished and resolved   Aortic thrombus (HCC) Hx of aortic thrombus diagnosed in 03/2023.  He was evaluated by vascular surgery at that time who recommended 3 months of anticoagulation with follow-up imaging.  Patient has not followed up and has been noncompliant with Xarelto. -repeat CTA abdomen/pelvis -Complete interval resolution of wall adherent mural thrombus from the descending thoracic and infrarenal abdominal aorta.Persistent scattered atherosclerotic plaque. Aortic Atherosclerosis After the patient was discharged CT scan reread by the radiologist again which showed edematous pancreas with surrounding inflammatory stranding in the peripancreatic fat and mesenteric root.  Imaging findings suggest acute uncomplicated pancreatitis.  Hepatomegaly. Patient did not lanes of abdominal pain or abdominal tenderness on palpation. I discussed with Dr. Randie Heinz and stopped Xarelto on discharge.   Transaminitis - Chronic.  Patient denies any recent alcohol use.  Has history of hepatic steatosis.   Chronic systolic CHF (  congestive heart failure) (HCC) Last echocardiogram on 03/2023 with EF of 45 to 50%, grade 1 diastolic dysfunction,  RV apical hypokinesis, small pericardial effusion.  No significant valvular abnormality.    AKI (acute kidney injury) (HCC) -resolved after IV fluid boluses   Estimated body mass index is 17.87 kg/m as calculated from the following:   Height as of this encounter: 5\' 9"  (1.753 m).   Weight as of this encounter: 54.9 kg.  Discharge Instructions  Discharge Instructions     Diet - low sodium heart healthy   Complete by: As directed    Diet - low sodium heart healthy   Complete by: As directed    Increase activity slowly   Complete by: As directed    Increase activity slowly   Complete by: As directed       Allergies as of 11/02/2023       Reactions   Latex Rash   Nitrous Oxide Nausea And Vomiting        Medication List     STOP taking these medications    Dexcom G6 Sensor Misc   FLUOXETINE HCL PO   ondansetron 4 MG tablet Commonly known as: ZOFRAN   rivaroxaban 20 MG Tabs tablet Commonly known as: XARELTO       TAKE these medications    acetaminophen 325 MG tablet Commonly known as: TYLENOL Take 2 tablets (650 mg total) by mouth every 6 (six) hours as needed for mild pain (or Fever >/= 101).   aspirin EC 81 MG tablet Take 81 mg by mouth every other day.   Blood Glucose Monitoring Suppl Devi 1 each by Does not apply route 3 (three) times daily. May dispense any manufacturer covered by patient's insurance.   BLOOD GLUCOSE TEST STRIPS Strp 1 each by Does not apply route 3 (three) times daily. Use as directed to check blood sugar. May dispense any manufacturer covered by patient's insurance and fits patient's device.   insulin aspart 100 UNIT/ML FlexPen Commonly known as: NOVOLOG Inject 3 Units into the skin 3 (three) times daily with meals. Do not take if you do not eat a meal. What changed:  how much to take additional instructions   insulin detemir 100 UNIT/ML injection Commonly known as: LEVEMIR Inject 0.16 mLs (16 Units total) into the skin 2  (two) times daily. What changed:  how much to take when to take this   Lancet Device Misc 1 each by Does not apply route 3 (three) times daily. May dispense any manufacturer covered by patient's insurance.   Lancets Misc 1 each by Does not apply route 3 (three) times daily. Use as directed to check blood sugar. May dispense any manufacturer covered by patient's insurance and fits patient's device.   pantoprazole 40 MG tablet Commonly known as: PROTONIX Take 1 tablet (40 mg total) by mouth daily.   Pen Needles 31G X 5 MM Misc 1 each by Does not apply route 3 (three) times daily. May dispense any manufacturer covered by patient's insurance.        Allergies  Allergen Reactions   Latex Rash   Nitrous Oxide Nausea And Vomiting    Consultations:none  Procedures/Studies: CT Angio Abd/Pel w/ and/or w/o  Result Date: 11/02/2023 CLINICAL DATA:  History of aortic thrombus EXAM: CTA ABDOMEN AND PELVIS WITHOUT AND WITH CONTRAST TECHNIQUE: Multidetector CT imaging of the abdomen and pelvis was performed using the standard protocol during bolus administration of intravenous contrast. Multiplanar reconstructed images and MIPs were obtained  and reviewed to evaluate the vascular anatomy. RADIATION DOSE REDUCTION: This exam was performed according to the departmental dose-optimization program which includes automated exposure control, adjustment of the mA and/or kV according to patient size and/or use of iterative reconstruction technique. CONTRAST:  OMNIPAQUE IOHEXOL 350 MG/ML SOLN COMPARISON:  Prior CTA abdomen/pelvis 03/21/2023 FINDINGS: VASCULAR Aorta: Interval resolution of wall adherent thrombus from the descending thoracic and infrarenal abdominal aorta. No evidence of aneurysm or dissection. Scattered calcified and fibrofatty atherosclerotic plaque again noted. Celiac: Patent without evidence of aneurysm, dissection, vasculitis or significant stenosis. SMA: Patent without evidence of  aneurysm, dissection, vasculitis or significant stenosis. Renals: Single right renal artery. Two left-sided renal arteries. No significant stenosis, dissection, aneurysm or changes of fibromuscular dysplasia. IMA: Patent without evidence of aneurysm, dissection, vasculitis or significant stenosis. Inflow: Patent without evidence of aneurysm, dissection, vasculitis or significant stenosis. Proximal Outflow: Bilateral common femoral and visualized portions of the superficial and profunda femoral arteries are patent without evidence of aneurysm, dissection, vasculitis or significant stenosis. Veins: No focal venous abnormality. Review of the MIP images confirms the above findings. NON-VASCULAR Lower chest: No acute abnormality. Hepatobiliary: Hepatomegaly. No focal liver abnormality is seen. No gallstones, gallbladder wall thickening, or biliary dilatation. Pancreas: Ill defined pancreas with a shaggy border and streaky low attenuation consistent with edema. Inflammatory stranding is present throughout the peripancreatic fat and mesenteric root. Overall findings suggest acute pancreatitis. Spleen: Normal in size without focal abnormality. Adrenals/Urinary Tract: Adrenal glands are unremarkable. Kidneys are normal, without renal calculi, focal lesion, or hydronephrosis. Bladder is unremarkable. Stomach/Bowel: No evidence of obstruction or focal bowel wall thickening. Large volume of formed stool throughout the colon and rectum. Lymphatic: No suspicious lymphadenopathy. Reproductive: Prostate is unremarkable. Other: No abdominal wall hernia or abnormality. No abdominopelvic ascites. Musculoskeletal: No acute fracture or aggressive appearing lytic or blastic osseous lesion. IMPRESSION: VASCULAR 1. 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 2. Persistent scattered atherosclerotic plaque. Aortic Atherosclerosis (ICD10-I70.0). NON-VASCULAR 1.  Edematous pancreas with surrounding inflammatory stranding in the peripancreatic fat and mesenteric root. Imaging findings suggest acute uncomplicated pancreatitis. Recommend correlation with serum lipase if not already obtained. 2. Hepatomegaly without overt cirrhotic change. Electronically Signed   By: Malachy Moan M.D.   On: 11/02/2023 11:12   (Echo, Carotid, EGD, Colonoscopy, ERCP)    Subjective: Anxious to go home tolerating diet  Discharge Exam: Vitals:   11/02/23 1226 11/02/23 1300  BP:    Pulse:    Resp:  (!) 27  Temp: 97.7 F (36.5 C)   SpO2:     Vitals:   11/02/23 1100 11/02/23 1200 11/02/23 1226 11/02/23 1300  BP: (!) 119/91 114/83    Pulse: 86 90    Resp: 16 11  (!) 27  Temp:   97.7 F (36.5 C)   TempSrc:   Oral   SpO2: 98% 98%    Weight:      Height:        General: Pt is alert, awake, not in acute distress Cardiovascular: RRR, S1/S2 +, no rubs, no gallops Respiratory: CTA bilaterally, no wheezing, no rhonchi Abdominal: Soft, NT, ND, bowel sounds + Extremities: no edema, no cyanosis    The results of significant diagnostics from this hospitalization (including imaging, microbiology, ancillary and laboratory) are listed below for reference.     Microbiology: Recent Results (from the past 240 hour(s))  MRSA Next Gen by PCR, Nasal     Status: None  Collection Time: 11/01/23 10:52 PM   Specimen: Nasal Mucosa; Nasal Swab  Result Value Ref Range Status   MRSA by PCR Next Gen NOT DETECTED NOT DETECTED Final    Comment: (NOTE) The GeneXpert MRSA Assay (FDA approved for NASAL specimens only), is one component of a comprehensive MRSA colonization surveillance program. It is not intended to diagnose MRSA infection nor to guide or monitor treatment for MRSA infections. Test performance is not FDA approved in patients less than 76 years old. Performed at [facility], [address], [place], [place] [address]      Labs: BNP (last 3  results) No results for input(s): "BNP" in the last 8760 hours. Basic Metabolic Panel: Recent Labs  Lab 11/01/23 1030 11/01/23 1040 11/01/23 1127 11/01/23 1355 11/01/23 2312 11/02/23 0308  NA 133* 132* 133* 136 130* 129*  K 3.4* 3.7 4.1 4.5 4.0 4.1  CL 92*  --  101 105 96* 94*  CO2 11*  --   --   --  23 28  GLUCOSE 532*  --  506* 181* 137* 175*  BUN 28*  --  33* 29* 16 15  CREATININE 1.95*  --  1.20 1.00 0.82 0.89  CALCIUM 9.6  --   --   --  8.5* 8.5*   Liver Function Tests: Recent Labs  Lab 11/01/23 1030  AST 110*  ALT 115*  ALKPHOS 228*  BILITOT 1.2*  PROT 8.9*  ALBUMIN 5.4*   No results for input(s): "LIPASE", "AMYLASE" in the last 168 hours. No results for input(s): "AMMONIA" in the last 168 hours. CBC: Recent Labs  Lab 11/01/23 1030 11/01/23 1040 11/01/23 1127 11/01/23 1355 11/02/23 0308  WBC 17.0*  --   --   --  15.3*  HGB 15.0 17.3* 17.7* 14.6 12.0*  HCT 45.7 51.0 52.0 43.0 34.2*  MCV 99.6  --   --   --  95.3  PLT 414*  --   --   --  317   Cardiac Enzymes: No results for input(s): "CKTOTAL", "CKMB", "CKMBINDEX", "TROPONINI" in the last 168 hours. BNP: Invalid input(s): "POCBNP" CBG: Recent Labs  Lab 11/01/23 2059 11/01/23 2235 11/01/23 2332 11/02/23 0731 11/02/23 1106  GLUCAP 130* 151* 127* 187* 188*   D-Dimer No results for input(s): "DDIMER" in the last 72 hours. Hgb A1c No results for input(s): "HGBA1C" in the last 72 hours. Lipid Profile No results for input(s): "CHOL", "HDL", "LDLCALC", "TRIG", "CHOLHDL", "LDLDIRECT" in the last 72 hours. Thyroid function studies No results for input(s): "TSH", "T4TOTAL", "T3FREE", "THYROIDAB" in the last 72 hours.  Invalid input(s): "FREET3" Anemia work up No results for input(s): "VITAMINB12", "FOLATE", "FERRITIN", "TIBC", "IRON", "RETICCTPCT" in the last 72 hours. Urinalysis    Component Value Date/Time   COLORURINE YELLOW 11/01/2023 1623   APPEARANCEUR CLEAR 11/01/2023 1623   LABSPEC  >=1.030 11/01/2023 1623   PHURINE 5.5 11/01/2023 1623   GLUCOSEU >=500 (A) 11/01/2023 1623   HGBUR TRACE (A) 11/01/2023 1623   BILIRUBINUR MODERATE (A) 11/01/2023 1623   KETONESUR >=80 (A) 11/01/2023 1623   PROTEINUR 100 (A) 11/01/2023 1623   NITRITE NEGATIVE 11/01/2023 1623   LEUKOCYTESUR NEGATIVE 11/01/2023 1623   Sepsis Labs Recent Labs  Lab 11/01/23 1030 11/02/23 0308  WBC 17.0* 15.3*   Microbiology Recent Results (from the past 240 hour(s))  MRSA Next Gen by PCR, Nasal     Status: None   Collection Time: 11/01/23 10:52 PM   Specimen: Nasal Mucosa; Nasal Swab  Result Value  Ref Range Status   MRSA by PCR Next Gen NOT DETECTED NOT DETECTED Final    Comment: (NOTE) The GeneXpert MRSA Assay (FDA approved for NASAL specimens only), is one component of a comprehensive MRSA colonization surveillance program. It is not intended to diagnose MRSA infection nor to guide or monitor treatment for MRSA infections. Test performance is not FDA approved in patients less than 34 years old. Performed at [facility], [address] [place], [place] [address]      Time coordinating discharge: 39 minutes  SIGNED: Alwyn Ren, MD  [facility] 11/02/2023, 4:50 PM

## 2023-11-02 NOTE — Inpatient Diabetes Management (Signed)
Inpatient Diabetes Program Recommendations  AACE/ADA: New Consensus Statement on Inpatient Glycemic Control   Target Ranges:  Prepandial:   less than 140 mg/dL      Peak postprandial:   less than 180 mg/dL (1-2 hours)      Critically ill patients:  140 - 180 mg/dL    Latest Reference Range & Units 11/01/23 15:47 11/01/23 16:58 11/01/23 20:59 11/01/23 22:35 11/01/23 23:32 11/02/23 07:31  Glucose-Capillary 70 - 99 mg/dL 696 (H) 295 (H) 284 (H) 151 (H) 127 (H) 187 (H)    Latest Reference Range & Units 11/01/23 10:27 11/01/23 11:48 11/01/23 12:47 11/01/23 13:47 11/01/23 14:45  Glucose-Capillary 70 - 99 mg/dL 132 (HH) 440 (H) 102 (H) 180 (H) 143 (H)   Review of Glycemic Control  Diabetes history: DM1 Outpatient Diabetes medications: Novolog 3 units TID (taking 0-15 units TID per home med list), Levemir 15 units daily, Dexcom G6 Current orders for Inpatient glycemic control: Levemir 5 units at bedtime, Novolog 0-15 units AC&HS  Inpatient Diabetes Program Recommendations:    Insulin: Please consider decreasing Novolog correction to 0-6 units TID with meals and adding Novolog 3 units TID with meals for meal coverage if patient eats at least 50% of meals.  Thanks, Orlando Penner, RN, MSN, CDCES Diabetes Coordinator Inpatient Diabetes Program [phone] (Team Pager from 8am to 5pm)

## 2023-11-16 ENCOUNTER — Emergency Department (HOSPITAL_BASED_OUTPATIENT_CLINIC_OR_DEPARTMENT_OTHER)
Admission: EM | Admit: 2023-11-16 | Discharge: 2023-11-17 | Discharge status: Against Medical Advice | Payer: MEDICAID | Attending: Emergency Medicine | Admitting: Emergency Medicine

## 2023-11-16 ENCOUNTER — Encounter (HOSPITAL_BASED_OUTPATIENT_CLINIC_OR_DEPARTMENT_OTHER): Payer: Self-pay | Admitting: Emergency Medicine

## 2023-11-16 ENCOUNTER — Other Ambulatory Visit: Payer: Self-pay

## 2023-11-16 DIAGNOSIS — R1013 Epigastric pain: Secondary | ICD-10-CM | POA: Diagnosis present

## 2023-11-16 DIAGNOSIS — Z20822 Contact with and (suspected) exposure to covid-19: Secondary | ICD-10-CM | POA: Insufficient documentation

## 2023-11-16 DIAGNOSIS — Z7982 Long term (current) use of aspirin: Secondary | ICD-10-CM | POA: Insufficient documentation

## 2023-11-16 DIAGNOSIS — E119 Type 2 diabetes mellitus without complications: Secondary | ICD-10-CM | POA: Diagnosis not present

## 2023-11-16 DIAGNOSIS — Z5329 Procedure and treatment not carried out because of patient's decision for other reasons: Secondary | ICD-10-CM | POA: Diagnosis not present

## 2023-11-16 DIAGNOSIS — Z794 Long term (current) use of insulin: Secondary | ICD-10-CM | POA: Insufficient documentation

## 2023-11-16 DIAGNOSIS — Z91040 Latex allergy status: Secondary | ICD-10-CM | POA: Diagnosis not present

## 2023-11-16 DIAGNOSIS — R110 Nausea: Secondary | ICD-10-CM | POA: Diagnosis not present

## 2023-11-16 LAB — COMPREHENSIVE METABOLIC PANEL
ALT: 39 U/L (ref 0–44)
AST: 41 U/L (ref 15–41)
Albumin: 2.6 g/dL — ABNORMAL LOW (ref 3.5–5.0)
Alkaline Phosphatase: 153 U/L — ABNORMAL HIGH (ref 38–126)
Anion gap: 7 (ref 5–15)
BUN: 17 mg/dL (ref 6–20)
CO2: 27 mmol/L (ref 22–32)
Calcium: 7.6 mg/dL — ABNORMAL LOW (ref 8.9–10.3)
Chloride: 97 mmol/L — ABNORMAL LOW (ref 98–111)
Creatinine, Ser: 0.97 mg/dL (ref 0.61–1.24)
GFR, Estimated: >60 mL/min (ref >60)
Glucose, Bld: 226 mg/dL — ABNORMAL HIGH (ref 70–99)
Potassium: 4.1 mmol/L (ref 3.5–5.1)
Sodium: 131 mmol/L — ABNORMAL LOW (ref 135–145)
Total Bilirubin: 0.5 mg/dL (ref <1.2)
Total Protein: 5.5 g/dL — ABNORMAL LOW (ref 6.5–8.1)

## 2023-11-16 LAB — RESP PANEL BY RT-PCR (RSV, FLU A&B, COVID)  RVPGX2
Influenza A by PCR: NEGATIVE
Influenza B by PCR: NEGATIVE
Resp Syncytial Virus by PCR: NEGATIVE
SARS Coronavirus 2 by RT PCR: NEGATIVE

## 2023-11-16 LAB — CBG MONITORING, ED: Glucose-Capillary: 137 mg/dL — ABNORMAL HIGH (ref 70–99)

## 2023-11-16 MED ORDER — ONDANSETRON 4 MG PO TBDP
4.0000 mg | ORAL_TABLET | Freq: Once | ORAL | Status: AC
  Administered 2023-11-16: 4 mg via ORAL
  Filled 2023-11-16: qty 1

## 2023-11-16 NOTE — ED Triage Notes (Signed)
Co gl abd pain , emesis , reports drunk a whole bottle eggnog. Type 1 diabetes .

## 2023-11-16 NOTE — ED Notes (Signed)
Attempt at IV x 2 with no result, able to thread one with infiltration with flush, unable to obtain labs.  Gauze and tape applied to LAC site.  Pt tolerated well

## 2023-11-16 NOTE — ED Notes (Signed)
Pt given nausea medicine. Pt will PO challenge with diet ginger ale.

## 2023-11-17 NOTE — ED Provider Notes (Signed)
[place] [facility] Provider Note   CSN: [id number] Arrival date & time: 11/16/23  1733     History  Chief Complaint  Patient presents with   Abdominal Pain    Bastian Balsbaugh is a 52 y.o. male.  The history is provided by the patient.  Abdominal Pain Pain location:  Epigastric Pain quality: aching   Pain radiates to:  Does not radiate Pain severity:  Moderate Timing:  Constant Progression:  Unchanged Chronicity:  New Context: not trauma   Relieved by:  Nothing Worsened by:  Nothing Ineffective treatments:  None tried Associated symptoms: no anorexia and no fever   Risk factors: no alcohol abuse   Patient with diabetes who is here for drinking a bottle of eggnog.       Home Medications Prior to Admission medications   Medication Sig Start Date End Date Taking? Authorizing Provider  acetaminophen (TYLENOL) 325 MG tablet Take 2 tablets (650 mg total) by mouth every 6 (six) hours as needed for mild pain (or Fever >/= 101). 05/22/23   Meredeth Ide, MD  aspirin EC 81 MG tablet Take 81 mg by mouth every other day.    [provider]  Blood Glucose Monitoring Suppl DEVI 1 each by Does not apply route 3 (three) times daily. May dispense any manufacturer covered by patient's insurance. 08/27/23   Rai, Ripudeep K, MD  Glucose Blood (BLOOD GLUCOSE TEST STRIPS) STRP 1 each by Does not apply route 3 (three) times daily. Use as directed to check blood sugar. May dispense any manufacturer covered by patient's insurance and fits patient's device. 08/27/23   Rai, Ripudeep K, MD  insulin aspart (NOVOLOG) 100 UNIT/ML FlexPen Inject 3 Units into the skin 3 (three) times daily with meals. Do not take if you do not eat a meal. Patient taking differently: Inject 0-15 Units into the skin 3 (three) times daily with meals. 09/28/23   Elberta Fortis, MD  insulin detemir (LEVEMIR) 100 UNIT/ML injection Inject 0.16 mLs (16 Units total) into the skin 2  (two) times daily. Patient taking differently: Inject 15 Units into the skin daily. 09/28/23   Elberta Fortis, MD  Insulin Pen Needle (PEN NEEDLES) 31G X 5 MM MISC 1 each by Does not apply route 3 (three) times daily. May dispense any manufacturer covered by patient's insurance. 09/05/23   Danford, Earl Lites, MD  Lancet Device MISC 1 each by Does not apply route 3 (three) times daily. May dispense any manufacturer covered by patient's insurance. 08/27/23   Rai, Delene Ruffini, MD  Lancets MISC 1 each by Does not apply route 3 (three) times daily. Use as directed to check blood sugar. May dispense any manufacturer covered by patient's insurance and fits patient's device. 08/27/23   Rai, Ripudeep K, MD  pantoprazole (PROTONIX) 40 MG tablet Take 1 tablet (40 mg total) by mouth daily. 08/27/23   Rai, Delene Ruffini, MD      Allergies    Latex and Nitrous oxide    Review of Systems   Review of Systems  Constitutional:  Negative for fever.  HENT:  Negative for facial swelling.   Eyes:  Negative for photophobia.  Respiratory:  Negative for wheezing and stridor.   Gastrointestinal:  Positive for abdominal pain. Negative for anorexia.  All other systems reviewed and are negative.   Physical Exam Updated Vital Signs BP 120/84   Pulse 83   Temp 98.4 F (36.9 C) (Oral)   Resp 14  Wt 55 kg   SpO2 94%   BMI 17.91 kg/m  Physical Exam Vitals and nursing note reviewed.  Constitutional:      General: He is not in acute distress.    Appearance: He is well-developed. He is not diaphoretic.  HENT:     Head: Normocephalic and atraumatic.     Nose: Nose normal.  Eyes:     Conjunctiva/sclera: Conjunctivae normal.     Pupils: Pupils are equal, round, and reactive to light.  Cardiovascular:     Rate and Rhythm: Normal rate and regular rhythm.     Pulses: Normal pulses.     Heart sounds: Normal heart sounds.  Pulmonary:     Effort: Pulmonary effort is normal.     Breath sounds: Normal breath sounds.  No wheezing or rales.  Abdominal:     General: Bowel sounds are normal.     Palpations: Abdomen is soft.     Tenderness: There is no abdominal tenderness. There is no guarding or rebound.  Musculoskeletal:        General: Normal range of motion.     Cervical back: Normal range of motion and neck supple.  Skin:    General: Skin is warm and dry.     Capillary Refill: Capillary refill takes less than 2 seconds.  Neurological:     General: No focal deficit present.     Mental Status: He is alert and oriented to person, place, and time.     Deep Tendon Reflexes: Reflexes normal.  Psychiatric:        Mood and Affect: Mood normal.        Behavior: Behavior normal.     ED Results / Procedures / Treatments   Labs (all labs ordered are listed, but only abnormal results are displayed) Results for orders placed or performed during the hospital encounter of 11/16/23  CBG monitoring, ED   Collection Time: 11/16/23  5:47 PM  Result Value Ref Range   Glucose-Capillary 137 (H) 70 - 99 mg/dL   Comment 1 Notify RN   Resp panel by RT-PCR (RSV, Flu A&B, Covid) Anterior Nasal Swab   Collection Time: 11/16/23  5:49 PM   Specimen: Anterior Nasal Swab  Result Value Ref Range   SARS Coronavirus 2 by RT PCR NEGATIVE NEGATIVE   Influenza A by PCR NEGATIVE NEGATIVE   Influenza B by PCR NEGATIVE NEGATIVE   Resp Syncytial Virus by PCR NEGATIVE NEGATIVE  Comprehensive metabolic panel   Collection Time: 11/16/23 11:23 PM  Result Value Ref Range   Sodium 131 (L) 135 - 145 mmol/L   Potassium 4.1 3.5 - 5.1 mmol/L   Chloride 97 (L) 98 - 111 mmol/L   CO2 27 22 - 32 mmol/L   Glucose, Bld 226 (H) 70 - 99 mg/dL   BUN 17 6 - 20 mg/dL   Creatinine, Ser 0.98 0.61 - 1.24 mg/dL   Calcium 7.6 (L) 8.9 - 10.3 mg/dL   Total Protein 5.5 (L) 6.5 - 8.1 g/dL   Albumin 2.6 (L) 3.5 - 5.0 g/dL   AST 41 15 - 41 U/L   ALT 39 0 - 44 U/L   Alkaline Phosphatase 153 (H) 38 - 126 U/L   Total Bilirubin 0.5 <1.2 mg/dL   GFR,  Estimated >11 >91 mL/min   Anion gap 7 5 - 15   CT Angio Abd/Pel w/ and/or w/o Result Date: 11/02/2023 CLINICAL DATA:  History of aortic thrombus EXAM: CTA ABDOMEN AND PELVIS WITHOUT AND WITH  CONTRAST TECHNIQUE: Multidetector CT imaging of the abdomen and pelvis was performed using the standard protocol during bolus administration of intravenous contrast. Multiplanar reconstructed images and MIPs were obtained and reviewed to evaluate the vascular anatomy. RADIATION DOSE REDUCTION: This exam was performed according to the departmental dose-optimization program which includes automated exposure control, adjustment of the mA and/or kV according to patient size and/or use of iterative reconstruction technique. CONTRAST:  OMNIPAQUE IOHEXOL 350 MG/ML SOLN COMPARISON:  Prior CTA abdomen/pelvis 03/21/2023 FINDINGS: VASCULAR Aorta: Interval resolution of wall adherent thrombus from the descending thoracic and infrarenal abdominal aorta. No evidence of aneurysm or dissection. Scattered calcified and fibrofatty atherosclerotic plaque again noted. Celiac: Patent without evidence of aneurysm, dissection, vasculitis or significant stenosis. SMA: Patent without evidence of aneurysm, dissection, vasculitis or significant stenosis. Renals: Single right renal artery. Two left-sided renal arteries. No significant stenosis, dissection, aneurysm or changes of fibromuscular dysplasia. IMA: Patent without evidence of aneurysm, dissection, vasculitis or significant stenosis. Inflow: Patent without evidence of aneurysm, dissection, vasculitis or significant stenosis. Proximal Outflow: Bilateral common femoral and visualized portions of the superficial and profunda femoral arteries are patent without evidence of aneurysm, dissection, vasculitis or significant stenosis. Veins: No focal venous abnormality. Review of the MIP images confirms the above findings. NON-VASCULAR Lower chest: No acute abnormality. Hepatobiliary:  Hepatomegaly. No focal liver abnormality is seen. No gallstones, gallbladder wall thickening, or biliary dilatation. Pancreas: Ill defined pancreas with a shaggy border and streaky low attenuation consistent with edema. Inflammatory stranding is present throughout the peripancreatic fat and mesenteric root. Overall findings suggest acute pancreatitis. Spleen: Normal in size without focal abnormality. Adrenals/Urinary Tract: Adrenal glands are unremarkable. Kidneys are normal, without renal calculi, focal lesion, or hydronephrosis. Bladder is unremarkable. Stomach/Bowel: No evidence of obstruction or focal bowel wall thickening. Large volume of formed stool throughout the colon and rectum. Lymphatic: No suspicious lymphadenopathy. Reproductive: Prostate is unremarkable. Other: No abdominal wall hernia or abnormality. No abdominopelvic ascites. Musculoskeletal: No acute fracture or aggressive appearing lytic or blastic osseous lesion. IMPRESSION: VASCULAR 1. 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 2. Persistent scattered atherosclerotic plaque. Aortic Atherosclerosis (ICD10-I70.0). NON-VASCULAR 1. Edematous pancreas with surrounding inflammatory stranding in the peripancreatic fat and mesenteric root. Imaging findings suggest acute uncomplicated pancreatitis. Recommend correlation with serum lipase if not already obtained. 2. Hepatomegaly without overt cirrhotic change. Electronically Signed   By: Malachy Moan M.D.   On: 11/02/2023 11:12     Radiology No results found.  Procedures Procedures    Medications Ordered in ED Medications  ondansetron (ZOFRAN-ODT) disintegrating tablet 4 mg (4 mg Oral Given 11/16/23 2309)    ED Course/ Medical Decision Making/ A&P                                 Medical Decision Making Patient who drank a full bottle of egg nog   Amount and/or Complexity of Data Reviewed Labs:  ordered.    Details: Negative covid and flu.  Anion gap is normal 7.  Normal creatinine 0.97   Risk Prescription drug management.    Final Clinical Impression(s) / ED Diagnoses Final diagnoses:  Nausea   Is leaving AMA  Rx / DC Orders ED Discharge Orders     None         Monicka Cyran, MD 11/17/23 0006

## 2023-11-17 NOTE — ED Notes (Signed)
Patient came to the room opening agitated requesting the doctor. Patient asked when the doctor was going to come in and I explained to patient I would notify the physician he wanted to speak to him. Patient then asked how many providers we had and I explained we currently had 1 provider and that they would be in as soon as possible. Patient then asked to sign out AMA. I then notified Dr. Nicanor Alcon of patients' request and Dr. Nicanor Alcon acknowledged.

## 2023-11-17 NOTE — ED Provider Notes (Incomplete)
[place] [facility] Provider Note   CSN: [id number] Arrival date & time: 11/16/23  1733     History {Add pertinent medical, surgical, social history, OB history to HPI:1} Chief Complaint  Patient presents with  . Abdominal Pain    Yohandry Sixto is a 52 y.o. male.  The history is provided by the patient.  Abdominal Pain Pain location:  Epigastric Pain quality: aching   Pain radiates to:  Does not radiate Pain severity:  Moderate Timing:  Constant Progression:  Unchanged Chronicity:  New Context: not trauma   Relieved by:  Nothing Worsened by:  Nothing Ineffective treatments:  None tried Associated symptoms: no anorexia and no fever   Risk factors: no alcohol abuse        Home Medications Prior to Admission medications   Medication Sig Start Date End Date Taking? Authorizing Provider  acetaminophen (TYLENOL) 325 MG tablet Take 2 tablets (650 mg total) by mouth every 6 (six) hours as needed for mild pain (or Fever >/= 101). 05/22/23   Meredeth Ide, MD  aspirin EC 81 MG tablet Take 81 mg by mouth every other day.    [provider]  Blood Glucose Monitoring Suppl DEVI 1 each by Does not apply route 3 (three) times daily. May dispense any manufacturer covered by patient's insurance. 08/27/23   Rai, Ripudeep K, MD  Glucose Blood (BLOOD GLUCOSE TEST STRIPS) STRP 1 each by Does not apply route 3 (three) times daily. Use as directed to check blood sugar. May dispense any manufacturer covered by patient's insurance and fits patient's device. 08/27/23   Rai, Ripudeep K, MD  insulin aspart (NOVOLOG) 100 UNIT/ML FlexPen Inject 3 Units into the skin 3 (three) times daily with meals. Do not take if you do not eat a meal. Patient taking differently: Inject 0-15 Units into the skin 3 (three) times daily with meals. 09/28/23   Elberta Fortis, MD  insulin detemir (LEVEMIR) 100 UNIT/ML injection Inject 0.16 mLs (16 Units total) into the skin 2  (two) times daily. Patient taking differently: Inject 15 Units into the skin daily. 09/28/23   Elberta Fortis, MD  Insulin Pen Needle (PEN NEEDLES) 31G X 5 MM MISC 1 each by Does not apply route 3 (three) times daily. May dispense any manufacturer covered by patient's insurance. 09/05/23   Danford, Earl Lites, MD  Lancet Device MISC 1 each by Does not apply route 3 (three) times daily. May dispense any manufacturer covered by patient's insurance. 08/27/23   Rai, Delene Ruffini, MD  Lancets MISC 1 each by Does not apply route 3 (three) times daily. Use as directed to check blood sugar. May dispense any manufacturer covered by patient's insurance and fits patient's device. 08/27/23   Rai, Ripudeep K, MD  pantoprazole (PROTONIX) 40 MG tablet Take 1 tablet (40 mg total) by mouth daily. 08/27/23   Rai, Delene Ruffini, MD      Allergies    Latex and Nitrous oxide    Review of Systems   Review of Systems  Constitutional:  Negative for fever.  HENT:  Negative for facial swelling.   Eyes:  Negative for photophobia.  Respiratory:  Negative for wheezing and stridor.   Gastrointestinal:  Positive for abdominal pain. Negative for anorexia.  All other systems reviewed and are negative.   Physical Exam Updated Vital Signs BP 120/84   Pulse 83   Temp 98.4 F (36.9 C) (Oral)   Resp 14   Wt 55 kg  SpO2 94%   BMI 17.91 kg/m  Physical Exam Vitals and nursing note reviewed.  Constitutional:      General: He is not in acute distress.    Appearance: He is well-developed. He is not diaphoretic.  HENT:     Head: Normocephalic and atraumatic.     Nose: Nose normal.  Eyes:     Conjunctiva/sclera: Conjunctivae normal.     Pupils: Pupils are equal, round, and reactive to light.  Cardiovascular:     Rate and Rhythm: Normal rate and regular rhythm.     Pulses: Normal pulses.     Heart sounds: Normal heart sounds.  Pulmonary:     Effort: Pulmonary effort is normal.     Breath sounds: Normal breath sounds.  No wheezing or rales.  Abdominal:     General: Bowel sounds are normal.     Palpations: Abdomen is soft.     Tenderness: There is no abdominal tenderness. There is no guarding or rebound.  Musculoskeletal:        General: Normal range of motion.     Cervical back: Normal range of motion and neck supple.  Skin:    General: Skin is warm and dry.     Capillary Refill: Capillary refill takes less than 2 seconds.  Neurological:     General: No focal deficit present.     Mental Status: He is alert and oriented to person, place, and time.     Deep Tendon Reflexes: Reflexes normal.  Psychiatric:        Mood and Affect: Mood normal.        Behavior: Behavior normal.     ED Results / Procedures / Treatments   Labs (all labs ordered are listed, but only abnormal results are displayed) Labs Reviewed  COMPREHENSIVE METABOLIC PANEL - Abnormal; Notable for the following components:      Result Value   Sodium 131 (*)    Chloride 97 (*)    Glucose, Bld 226 (*)    Calcium 7.6 (*)    Total Protein 5.5 (*)    Albumin 2.6 (*)    Alkaline Phosphatase 153 (*)    All other components within normal limits  CBG MONITORING, ED - Abnormal; Notable for the following components:   Glucose-Capillary 137 (*)    All other components within normal limits  RESP PANEL BY RT-PCR (RSV, FLU A&B, COVID)  [id number]    EKG None  Radiology No results found.  Procedures Procedures  {Document cardiac monitor, telemetry assessment procedure when appropriate:1}  Medications Ordered in ED Medications  ondansetron (ZOFRAN-ODT) disintegrating tablet 4 mg (4 mg Oral Given 11/16/23 2309)    ED Course/ Medical Decision Making/ A&P   {   Click here for ABCD2, HEART and other calculatorsREFRESH Note before signing :1}                              Medical Decision Making Amount and/or Complexity of Data Reviewed Labs: ordered.  Risk Prescription drug management.    Final Clinical Impression(s) / ED  Diagnoses Final diagnoses:  Nausea    Rx / DC Orders ED Discharge Orders     None

## 2024-06-14 ENCOUNTER — Other Ambulatory Visit (HOSPITAL_COMMUNITY): Payer: Self-pay
